# Patient Record
Sex: Male | Born: 1965
Health system: Southern US, Community
[De-identification: ages and names within clinical notes are randomized; demographics above are authoritative.]

## PROBLEM LIST (undated history)

## (undated) DIAGNOSIS — I4819 Other persistent atrial fibrillation: Secondary | ICD-10-CM

## (undated) DIAGNOSIS — R7303 Prediabetes: Secondary | ICD-10-CM

## (undated) DIAGNOSIS — I10 Essential (primary) hypertension: Secondary | ICD-10-CM

## (undated) DIAGNOSIS — Z720 Tobacco use: Secondary | ICD-10-CM

---

## 1999-02-01 ENCOUNTER — Emergency Department (HOSPITAL_COMMUNITY): Admission: EM | Admit: 1999-02-01 | Discharge: 1999-02-01 | Payer: Self-pay | Admitting: Emergency Medicine

## 2000-11-05 ENCOUNTER — Encounter: Payer: Self-pay | Admitting: Emergency Medicine

## 2000-11-05 ENCOUNTER — Emergency Department (HOSPITAL_COMMUNITY): Admission: EM | Admit: 2000-11-05 | Discharge: 2000-11-05 | Payer: Self-pay | Admitting: Emergency Medicine

## 2011-10-21 ENCOUNTER — Encounter (HOSPITAL_COMMUNITY): Payer: Self-pay | Admitting: *Deleted

## 2011-10-21 ENCOUNTER — Emergency Department (HOSPITAL_COMMUNITY)
Admission: EM | Admit: 2011-10-21 | Discharge: 2011-10-21 | Disposition: A | Discharge status: Home / Self Care | Payer: Medicaid Other | Attending: Emergency Medicine | Admitting: Emergency Medicine

## 2011-10-21 ENCOUNTER — Emergency Department (HOSPITAL_COMMUNITY): Payer: Medicaid Other

## 2011-10-21 DIAGNOSIS — J40 Bronchitis, not specified as acute or chronic: Secondary | ICD-10-CM | POA: Insufficient documentation

## 2011-10-21 DIAGNOSIS — F17200 Nicotine dependence, unspecified, uncomplicated: Secondary | ICD-10-CM | POA: Insufficient documentation

## 2011-10-21 DIAGNOSIS — Z720 Tobacco use: Secondary | ICD-10-CM

## 2011-10-21 DIAGNOSIS — I10 Essential (primary) hypertension: Secondary | ICD-10-CM | POA: Insufficient documentation

## 2011-10-21 DIAGNOSIS — R0602 Shortness of breath: Secondary | ICD-10-CM | POA: Insufficient documentation

## 2011-10-21 HISTORY — DX: Essential (primary) hypertension: I10

## 2011-10-21 LAB — BASIC METABOLIC PANEL
CO2: 27 mEq/L (ref 19–32)
Chloride: 102 mEq/L (ref 96–112)
Glucose, Bld: 109 mg/dL — ABNORMAL HIGH (ref 70–99)
Sodium: 141 mEq/L (ref 135–145)

## 2011-10-21 LAB — POCT I-STAT TROPONIN I: Troponin i, poc: 0.02 ng/mL (ref 0.00–0.08)

## 2011-10-21 LAB — CBC
HCT: 43 % (ref 39.0–52.0)
Hemoglobin: 14.5 g/dL (ref 13.0–17.0)
RBC: 4.85 MIL/uL (ref 4.22–5.81)

## 2011-10-21 LAB — PRO B NATRIURETIC PEPTIDE: Pro B Natriuretic peptide (BNP): 20 pg/mL (ref 0–125)

## 2011-10-21 MED ORDER — PREDNISONE 20 MG PO TABS: 60.0000 mg | ORAL_TABLET | Freq: Every day | ORAL | Status: DC

## 2011-10-21 MED ORDER — ALBUTEROL SULFATE HFA 108 (90 BASE) MCG/ACT IN AERS
2.0000 | INHALATION_SPRAY | RESPIRATORY_TRACT | Status: DC | PRN
  Filled 2011-10-21: qty 6.7

## 2011-10-21 MED ORDER — ALBUTEROL SULFATE (5 MG/ML) 0.5% IN NEBU
5.0000 mg | INHALATION_SOLUTION | Freq: Once | RESPIRATORY_TRACT | Status: AC
  Administered 2011-10-21: 5 mg via RESPIRATORY_TRACT
  Filled 2011-10-21: qty 1

## 2011-10-21 MED ORDER — AZITHROMYCIN 250 MG PO TABS: 250.0000 mg | ORAL_TABLET | Freq: Every day | ORAL | Status: AC

## 2011-10-21 NOTE — ED Provider Notes (Signed)
Medical screening examination/treatment/procedure(s) were performed by non-physician practitioner and as supervising physician I was immediately available for consultation/collaboration.   Nat Christen, MD 10/21/11 [phone]

## 2011-10-21 NOTE — Discharge Instructions (Signed)
FOLLOW UP WITH A PRIMARY CARE PHYSICIAN FOR FURTHER MANAGEMENT IF SYMPTOMS DO NOT IMPROVE. RETURN HERE AS NEEDED. TAKE MEDICATIONS AS PRESCRIBED. STOP SMOKING!  Bronchitis Bronchitis is a problem of the air tubes leading to your lungs. This problem makes it hard for air to get in and out of the lungs. You may cough a lot because your air tubes are narrow. Going without care can cause lasting (chronic) bronchitis. HOME CARE   Drink enough fluids to keep your pee (urine) clear or pale yellow.   Use a cool mist humidifier.   Quit smoking if you smoke. If you keep smoking, the bronchitis might not get better.   Only take medicine as told by your doctor.  GET HELP RIGHT AWAY IF:   Coughing keeps you awake.   You start to wheeze.   You become more sick or weak.   You have a hard time breathing or get short of breath.   You cough up blood.   Coughing lasts more than 2 weeks.   You have a fever.   Your baby is older than 3 months with a rectal temperature of 102 F (38.9 C) or higher.   Your baby is 77 months old or younger with a rectal temperature of 100.4 F (38 C) or higher.  MAKE SURE YOU:  Understand these instructions.   Will watch your condition.   Will get help right away if you are not doing well or get worse.  Document Released: 11/27/2007 Document Revised: 05/30/2011 Document Reviewed: 05/12/2009 [facility] Patient Information 2012 [place], [place].

## 2011-10-21 NOTE — ED Notes (Signed)
Patient undressed and in a gown. Cardiac monitor, pulse oximetry, and blood pressure cuff on. 

## 2011-10-21 NOTE — ED Provider Notes (Signed)
History     CSN: [id number]  Arrival date & time 10/21/11  1102   First MD Initiated Contact with Patient 10/21/11 1255      Chief Complaint  Patient presents with  . Chest Pain    after cough    (Consider location/radiation/quality/duration/timing/severity/associated sxs/prior treatment) Patient is a 46 y.o. male presenting with chest pain. The history is provided by the patient.  Chest Pain The chest pain began 3 - 5 days ago. The chest pain is unchanged. The pain is associated with coughing. Primary symptoms include cough. Pertinent negatives for primary symptoms include no fever, no shortness of breath, no nausea and no vomiting. Associated symptoms comments: Patient is pack-per-day smoker with recent onset of cough that is associated with chest pain. Cough and discomfort are worse at night when he lies down. No known fever. He does complain of sweating at night. No N, V. Cough is productive. No shortness of breath..     Past Medical History  Diagnosis Date  . Hypertension     History reviewed. No pertinent past surgical history.  History reviewed. No pertinent family history.  History  Substance Use Topics  . Smoking status: Current Everyday Smoker -- 0.5 packs/day    Types: Cigarettes  . Smokeless tobacco: Not on file  . Alcohol Use: No      Review of Systems  Constitutional: Negative for fever and chills.  HENT: Positive for sore throat. Negative for congestion and rhinorrhea.   Respiratory: Positive for cough. Negative for shortness of breath.   Cardiovascular: Positive for chest pain.       Chest pain with cough.  Gastrointestinal: Negative.  Negative for nausea and vomiting.  Musculoskeletal: Negative.   Skin: Negative.   Neurological: Negative.     Allergies  Review of patient's allergies indicates no known allergies.  Home Medications   Current Outpatient Rx  Name Route Sig Dispense Refill  . ACETAMINOPHEN 500 MG PO TABS Oral Take 1,000 mg by  mouth every 6 (six) hours as needed. For pain    . GUAIFENESIN ER 600 MG PO TB12 Oral Take 600 mg by mouth 2 (two) times daily.      BP 146/81  Pulse 60  Temp(Src) 97.7 F (36.5 C) (Oral)  Resp 20  Ht 5\' 10"  (1.778 m)  Wt 240 lb (108.863 kg)  BMI 34.44 kg/m2  SpO2 97%  Physical Exam  Constitutional: He appears well-developed and well-nourished.  HENT:  Head: Normocephalic.  Neck: Normal range of motion. Neck supple.  Cardiovascular: Normal rate and regular rhythm.   Pulmonary/Chest: Effort normal. He has no wheezes. He has rales.  Abdominal: Soft. Bowel sounds are normal. There is no tenderness. There is no rebound and no guarding.  Musculoskeletal: Normal range of motion. He exhibits no edema.  Skin: Skin is warm and dry.  Psychiatric: He has a normal mood and affect.    ED Course  Procedures (including critical care time)  Labs Reviewed  BASIC METABOLIC PANEL - Abnormal; Notable for the following:    Glucose, Bld 109 (*)    All other components within normal limits  CBC  PRO B NATRIURETIC PEPTIDE  POCT I-STAT TROPONIN I   Results for orders placed during the hospital encounter of 10/21/11  CBC      Component Value Range   WBC 7.1  4.0 - 10.5 (K/uL)   RBC 4.85  4.22 - 5.81 (MIL/uL)   Hemoglobin 14.5  13.0 - 17.0 (g/dL)   HCT 43.0  39.0 - 52.0 (%)   MCV 88.7  78.0 - 100.0 (fL)   MCH 29.9  26.0 - 34.0 (pg)   MCHC 33.7  30.0 - 36.0 (g/dL)   RDW 16.1  09.6 - 04.5 (%)   Platelets 243  150 - 400 (K/uL)  PRO B NATRIURETIC PEPTIDE      Component Value Range   Pro B Natriuretic peptide (BNP) 20.0  0 - 125 (pg/mL)  BASIC METABOLIC PANEL      Component Value Range   Sodium 141  135 - 145 (mEq/L)   Potassium 4.0  3.5 - 5.1 (mEq/L)   Chloride 102  96 - 112 (mEq/L)   CO2 27  19 - 32 (mEq/L)   Glucose, Bld 109 (*) 70 - 99 (mg/dL)   BUN 8  6 - 23 (mg/dL)   Creatinine, Ser 4.09  0.50 - 1.35 (mg/dL)   Calcium 9.5  8.4 - 81.1 (mg/dL)   GFR calc non Af Amer >90  >90  (mL/min)   GFR calc Af Amer >90  >90 (mL/min)  POCT I-STAT TROPONIN I      Component Value Range   Troponin i, poc 0.02  0.00 - 0.08 (ng/mL)   Comment 3              Dg Chest 2 View  10/21/2011  *RADIOLOGY REPORT*  Clinical Data: Cough, shortness of breath and fever.  CHEST - 2 VIEW  Comparison: No priors.  Findings: Lung volumes are normal.  No consolidative airspace disease.  No pleural effusions.  No pneumothorax.  No pulmonary nodule or mass noted.  Pulmonary vasculature and the cardiomediastinal silhouette are within normal limits.  IMPRESSION: 1. No radiographic evidence of acute cardiopulmonary disease.  Original Report Authenticated By: Florencia Reasons, M.D.     No diagnosis found. 1. Bronchitis 2. Tobacco abuse    MDM  CXR unremarkable, no PNA. He is feeling better after breathing tx. EKG nonacute with negative blood studies. Favor bronchitis that would benefit from short course steroids, abx and inhaler. Discussed care plan with patient who is comfortable with discharge.        Rodena Medin, PA-C 10/21/11 1510

## 2011-10-21 NOTE — ED Notes (Signed)
Pt started coughing 1 week prior and pain followed.  Pain is sternal.

## 2011-10-21 NOTE — ED Notes (Signed)
Pt reports having congestion and cough x 1 week.  Sputum is yellow-green in color.  pts chest tender on palpation.  Pt ambulatory.  Denies N/V/diaphoresis.  States that he had a fever at home but did not check to see how high it was.  Took tylenol which broke the fever.  SB clear bilaterally. No wheezing or crackles noted.

## 2012-09-19 ENCOUNTER — Encounter (HOSPITAL_COMMUNITY): Payer: Self-pay | Admitting: Emergency Medicine

## 2012-09-19 ENCOUNTER — Emergency Department (HOSPITAL_COMMUNITY)
Admission: EM | Admit: 2012-09-19 | Discharge: 2012-09-19 | Disposition: A | Discharge status: Home / Self Care | Payer: Self-pay | Attending: Emergency Medicine | Admitting: Emergency Medicine

## 2012-09-19 DIAGNOSIS — I10 Essential (primary) hypertension: Secondary | ICD-10-CM | POA: Insufficient documentation

## 2012-09-19 DIAGNOSIS — F17200 Nicotine dependence, unspecified, uncomplicated: Secondary | ICD-10-CM | POA: Insufficient documentation

## 2012-09-19 DIAGNOSIS — Z79899 Other long term (current) drug therapy: Secondary | ICD-10-CM | POA: Insufficient documentation

## 2012-09-19 DIAGNOSIS — L84 Corns and callosities: Secondary | ICD-10-CM | POA: Insufficient documentation

## 2012-09-19 MED ORDER — TRAMADOL HCL 50 MG PO TABS: 50.0000 mg | ORAL_TABLET | Freq: Four times a day (QID) | ORAL | Status: DC | PRN

## 2012-09-19 MED ORDER — LISINOPRIL-HYDROCHLOROTHIAZIDE 10-12.5 MG PO TABS: 1.0000 | ORAL_TABLET | Freq: Every day | ORAL | Status: DC

## 2012-09-19 MED ORDER — TRAMADOL HCL 50 MG PO TABS
50.0000 mg | ORAL_TABLET | Freq: Once | ORAL | Status: AC
  Administered 2012-09-19: 50 mg via ORAL
  Filled 2012-09-19: qty 1

## 2012-09-19 NOTE — ED Provider Notes (Signed)
History     CSN: [id number]  Arrival date & time 09/19/12  6213   First MD Initiated Contact with Patient 09/19/12 [phone]      Chief Complaint  Patient presents with  . Foot Pain    (Consider location/radiation/quality/duration/timing/severity/associated sxs/prior treatment) HPI  Brian Moody is a 47 y.o. male complaining of bilateral foot pain worsening over the course of 2 weeks. Patient stands at work and wears work boots. Pain was so bad yesterday had to leave work. He has 2 lesions on the sole of his feet that have been worsening. He takes a razor blade and chased him down. Pain is rated as severe, 8/10 and is exacerbated by standing and certain positions. Patient does not have a primary care physician. He was prescribed 2 hypertension pills that he ran out of several months ago.  Past Medical History  Diagnosis Date  . Hypertension     History reviewed. No pertinent past surgical history.  History reviewed. No pertinent family history.  History  Substance Use Topics  . Smoking status: Current Every Day Smoker -- 0.50 packs/day    Types: Cigarettes  . Smokeless tobacco: Not on file  . Alcohol Use: No      Review of Systems  Constitutional: Negative for fever.  Respiratory: Negative for shortness of breath.   Cardiovascular: Negative for chest pain.  Gastrointestinal: Negative for nausea, vomiting, abdominal pain and diarrhea.  Musculoskeletal:       Bilateral  foot pain  All other systems reviewed and are negative.    Allergies  Review of patient's allergies indicates no known allergies.  Home Medications   Current Outpatient Rx  Name  Route  Sig  Dispense  Refill  . acetaminophen (TYLENOL) 500 MG tablet   Oral   Take 1,000 mg by mouth every 6 (six) hours as needed. For pain         . guaiFENesin (MUCINEX) 600 MG 12 hr tablet   Oral   Take 600 mg by mouth 2 (two) times daily.         . predniSONE (DELTASONE) 20 MG tablet   Oral   Take 3  tablets (60 mg total) by mouth daily.   9 tablet   0     BP 180/95  Pulse 74  Temp(Src) 97.7 F (36.5 C) (Oral)  Resp 16  SpO2 98%  Physical Exam  Nursing note and vitals reviewed. Constitutional: He is oriented to person, place, and time. He appears well-developed and well-nourished. No distress.  HENT:  Head: Normocephalic.  Mouth/Throat: Oropharynx is clear and moist.  Eyes: Conjunctivae and EOM are normal. Pupils are equal, round, and reactive to light.  Neck: No JVD present.  Cardiovascular: Normal rate and intact distal pulses.   Pulmonary/Chest: Effort normal and breath sounds normal. No stridor.  Abdominal: Soft. Bowel sounds are normal.  Musculoskeletal: Normal range of motion. He exhibits no edema.  Neurological: He is alert and oriented to person, place, and time.  Skin:  Corn with callus to the soles of both feet  Psychiatric: He has a normal mood and affect.    ED Course  Procedures (including critical care time)  Labs Reviewed - No data to display No results found.   1. Corn or callus   2. Hypertension, uncontrolled   3. Tobacco use disorder       MDM   Brian Moody is a 47 y.o. male bothered by pain from corns and calluses to the soles  of bilateral feet. Patient has uncontrolled hypertension, I counseled patient on the importance of medication compliance with hypertension and primary care followup.   Filed Vitals:   09/19/12 0946  BP: 180/95  Pulse: 74  Temp: 97.7 F (36.5 C)  TempSrc: Oral  Resp: 16  SpO2: 98%     Pt verbalized understanding and agrees with care plan. Outpatient follow-up and return precautions given.    New Prescriptions   LISINOPRIL-HYDROCHLOROTHIAZIDE (PRINZIDE) 10-12.5 MG PER TABLET    Take 1 tablet by mouth daily.   TRAMADOL (ULTRAM) 50 MG TABLET    Take 1 tablet (50 mg total) by mouth every 6 (six) hours as needed for pain.           Wynetta Emery, PA-C 09/19/12 1034

## 2012-09-19 NOTE — ED Notes (Signed)
Onset of feet pain 2 weeks ago, small lesion/callous on heel of right foot and on ball of right foot on right side.

## 2012-09-20 NOTE — ED Provider Notes (Signed)
Medical screening examination/treatment/procedure(s) were performed by non-physician practitioner and as supervising physician I was immediately available for consultation/collaboration.    Vida Roller, MD 09/20/12 [phone]

## 2012-09-24 ENCOUNTER — Encounter (HOSPITAL_COMMUNITY): Payer: Self-pay

## 2012-09-24 ENCOUNTER — Emergency Department (INDEPENDENT_AMBULATORY_CARE_PROVIDER_SITE_OTHER)
Admission: EM | Admit: 2012-09-24 | Discharge: 2012-09-24 | Disposition: A | Discharge status: Home / Self Care | Payer: Self-pay | Source: Home / Self Care

## 2012-09-24 DIAGNOSIS — G8929 Other chronic pain: Secondary | ICD-10-CM

## 2012-09-24 DIAGNOSIS — M79609 Pain in unspecified limb: Secondary | ICD-10-CM

## 2012-09-24 LAB — BASIC METABOLIC PANEL
CO2: 31 mEq/L (ref 19–32)
Chloride: 98 mEq/L (ref 96–112)
GFR calc Af Amer: >90 mL/min (ref >90)
Potassium: 3.2 mEq/L — ABNORMAL LOW (ref 3.5–5.1)
Sodium: 138 mEq/L (ref 135–145)

## 2012-09-24 MED ORDER — TRAMADOL HCL 50 MG PO TABS: 50.0000 mg | ORAL_TABLET | Freq: Four times a day (QID) | ORAL | Status: DC | PRN

## 2012-09-24 MED ORDER — AMLODIPINE BESYLATE 10 MG PO TABS: 10.0000 mg | ORAL_TABLET | Freq: Every day | ORAL | Status: DC

## 2012-09-24 NOTE — ED Notes (Signed)
Patient here to establish care History of HTN 
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
## 2012-10-08 ENCOUNTER — Emergency Department (INDEPENDENT_AMBULATORY_CARE_PROVIDER_SITE_OTHER)
Admission: EM | Admit: 2012-10-08 | Discharge: 2012-10-08 | Disposition: A | Discharge status: Home Health | Payer: Medicaid Other | Source: Home / Self Care

## 2012-10-08 ENCOUNTER — Encounter (HOSPITAL_COMMUNITY): Payer: Self-pay

## 2012-10-08 DIAGNOSIS — I10 Essential (primary) hypertension: Secondary | ICD-10-CM

## 2012-10-08 DIAGNOSIS — M79609 Pain in unspecified limb: Secondary | ICD-10-CM

## 2012-10-08 DIAGNOSIS — M79671 Pain in right foot: Secondary | ICD-10-CM

## 2012-10-08 MED ORDER — OXYCODONE HCL 5 MG PO TABS: 5.0000 mg | ORAL_TABLET | Freq: Four times a day (QID) | ORAL | Status: DC | PRN

## 2012-10-08 NOTE — ED Notes (Signed)
Patient complains of pain  To both feet caluss to middle of right foot

## 2012-10-08 NOTE — Progress Notes (Signed)
Patient Demographics  Brian Moody, is a 47 y.o. male  ZOX:[id number]  [id number]  DOB - [birth year]  Chief Complaint  Patient presents with  . Foot Pain        Subjective:   Brian Moody today is here for a follow up visit. He unfortunately continues to have pain in the left plantar surface and mostly on the right plantar surface from a recurrent large corn/callus. Claims he has difficulty putting weight on the right leg. Patient has No headache, No chest pain, No abdominal pain - No Nausea, No new weakness tingling or numbness, No Cough - SOB.   Objective:    Filed Vitals:   10/08/12 1239  BP: 127/73  Pulse: 78  Temp: 97.7 F (36.5 C)  TempSrc: Oral  Resp: 17  SpO2: 98%     ALLERGIES:  No Known Allergies  PAST MEDICAL HISTORY: Past Medical History  Diagnosis Date  . Hypertension     PAST SURGICAL HISTORY: History reviewed. No pertinent past surgical history.  FAMILY HISTORY: No history of CAD  MEDICATIONS AT HOME: Prior to Admission medications   Medication Sig Start Date End Date Taking? Authorizing Provider  albuterol (PROVENTIL HFA;VENTOLIN HFA) 108 (90 BASE) MCG/ACT inhaler Inhale 2 puffs into the lungs every 6 (six) hours as needed for wheezing.    Historical Provider, MD  amLODipine (NORVASC) 10 MG tablet Take 1 tablet (10 mg total) by mouth daily. 09/24/12   Leroy Sea, MD  lisinopril-hydrochlorothiazide (PRINZIDE) 10-12.5 MG per tablet Take 1 tablet by mouth daily. 09/19/12   Nicole Pisciotta, PA-C  oxyCODONE (ROXICODONE) 5 MG immediate release tablet Take 1 tablet (5 mg total) by mouth every 6 (six) hours as needed for pain. 10/08/12   Elo Marmolejos Levora Dredge, MD    REVIEW OF SYSTEMS:  Constitutional:   No   Fevers, chills, fatigue.  HEENT:    No headaches, Sore throat,   Cardio-vascular: No chest pain,  Orthopnea, swelling in lower extremities, anasarca, palpitations  GI:  No abdominal pain, nausea, vomiting, diarrhea  Resp: No  shortness of breath,  No coughing up of blood.No cough.No wheezing.  Skin:  no rash or lesions.  GU:  no dysuria, change in color of urine, no urgency or frequency.  No flank pain.  Musculoskeletal: No joint pain or swelling.  No decreased range of motion.  No back pain.  Psych: No change in mood or affect. No depression or anxiety.  No memory loss.   Exam  General appearance :Awake, alert, not in any distress. Speech Clear. Not toxic Looking HEENT: Atraumatic and Normocephalic, pupils equally reactive to light and accomodation Neck: supple, no JVD. No cervical lymphadenopathy.  Chest:Good air entry bilaterally, no added sounds  CVS: S1 S2 regular, no murmurs.  Abdomen: Bowel sounds present, Non tender and not distended with no gaurding, rigidity or rebound. Extremities: B/L Lower Ext shows no edema, both legs are warm to touch. Large callus/corn and the plantar aspect of the right foot. No surrounding erythema  Neurology: Awake alert, and oriented X 3, CN II-XII intact, Non focal Skin:No Rash Wounds:N/A    Data Review   CBC No results found for this basename: WBC, HGB, HCT, PLT, MCV, MCH, MCHC, RDW, NEUTRABS, LYMPHSABS, MONOABS, EOSABS, BASOSABS, BANDABS, BANDSABD,  in the last 168 hours  Chemistries   No results found for this basename: NA, K, CL, CO2, GLUCOSE, BUN, CREATININE, GFRCGP, CALCIUM, MG, AST, ALT, ALKPHOS, BILITOT,  in the last 168 hours ------------------------------------------------------------------------------------------------------------------ No results found  for this basename: HGBA1C,  in the last 72 hours ------------------------------------------------------------------------------------------------------------------ No results found for this basename: CHOL, HDL, LDLCALC, TRIG, CHOLHDL, LDLDIRECT,  in the last 72 hours ------------------------------------------------------------------------------------------------------------------ No results found  for this basename: TSH, T4TOTAL, FREET3, T3FREE, THYROIDAB,  in the last 72 hours ------------------------------------------------------------------------------------------------------------------ No results found for this basename: VITAMINB12, FOLATE, FERRITIN, TIBC, IRON, RETICCTPCT,  in the last 72 hours  Coagulation profile  No results found for this basename: INR, PROTIME,  in the last 168 hours    Assessment & Plan  Hypertension - BP controlled, continue with amlodipine and Prinzide   large corn/callus-plantar surface of the right leg - Refer to podiatry     Follow-up Information   Schedule an appointment as soon as possible for a visit in 1 month to follow up.

## 2012-10-09 NOTE — ED Notes (Signed)
Patient has an appt for [facility] 10/14/12 @ 10 am

## 2012-10-22 ENCOUNTER — Emergency Department (INDEPENDENT_AMBULATORY_CARE_PROVIDER_SITE_OTHER)
Admission: EM | Admit: 2012-10-22 | Discharge: 2012-10-22 | Disposition: A | Discharge status: Home Health | Payer: No Typology Code available for payment source | Source: Home / Self Care

## 2012-10-22 ENCOUNTER — Encounter (HOSPITAL_COMMUNITY): Payer: Self-pay | Admitting: *Deleted

## 2012-10-22 DIAGNOSIS — I10 Essential (primary) hypertension: Secondary | ICD-10-CM

## 2012-10-22 DIAGNOSIS — L84 Corns and callosities: Secondary | ICD-10-CM

## 2012-10-22 MED ORDER — OXYCODONE HCL 5 MG PO TABS: 5.0000 mg | ORAL_TABLET | Freq: Four times a day (QID) | ORAL | Status: DC | PRN

## 2012-10-22 MED ORDER — AMLODIPINE BESYLATE 10 MG PO TABS: 10.0000 mg | ORAL_TABLET | Freq: Every day | ORAL | Status: DC

## 2012-10-22 MED ORDER — LISINOPRIL-HYDROCHLOROTHIAZIDE 10-12.5 MG PO TABS: 1.0000 | ORAL_TABLET | Freq: Every day | ORAL | Status: DC

## 2012-10-22 NOTE — Progress Notes (Signed)
Patient Demographics  Brian Moody, is a 47 y.o. male  WGN:[id number]  HQI:[id number]  DOB - 1966/04/19  Follow up visit-still with B/L Foot pain      Subjective:   Brian Moody today is here for a follow up visit. Patient had a large corn on the plantar surface of the right leg and a smallar one on the plantar surface of the left foot. He has seen Podiatry and had it scraped up, however he claims it is recurring and is still complaining of pain-although better than last visit. . Patient has No headache, No chest pain, No abdominal pain - No Nausea, No new weakness tingling or numbness, No Cough - SOB.   Objective:    Filed Vitals:   10/22/12 1510  BP: 126/85  Pulse: 72  Temp: 98.5 F (36.9 C)  TempSrc: Oral  Resp: 20  SpO2: 100%     ALLERGIES:  No Known Allergies  PAST MEDICAL HISTORY: Past Medical History  Diagnosis Date  . Hypertension     MEDICATIONS AT HOME: Prior to Admission medications   Medication Sig Start Date End Date Taking? Authorizing Provider  albuterol (PROVENTIL HFA;VENTOLIN HFA) 108 (90 BASE) MCG/ACT inhaler Inhale 2 puffs into the lungs every 6 (six) hours as needed for wheezing.    Historical Provider, MD  amLODipine (NORVASC) 10 MG tablet Take 1 tablet (10 mg total) by mouth daily. 09/24/12   Leroy Sea, MD  lisinopril-hydrochlorothiazide (PRINZIDE) 10-12.5 MG per tablet Take 1 tablet by mouth daily. 09/19/12   Nicole Pisciotta, PA-C  oxyCODONE (ROXICODONE) 5 MG immediate release tablet Take 1 tablet (5 mg total) by mouth every 6 (six) hours as needed for pain. 10/08/12   Shanker Levora Dredge, MD     Exam  General appearance :Awake, alert, not in any distress. Speech Clear. Not toxic Looking HEENT: Atraumatic and Normocephalic, pupils equally reactive to light and accomodation Neck: supple, no JVD. No cervical lymphadenopathy.  Chest:Good air entry bilaterally, no added sounds  CVS: S1 S2 regular, no murmurs.  Abdomen: Bowel sounds  present, Non tender and not distended with no gaurding, rigidity or rebound. Extremities: B/L Lower Ext shows no edema, both legs are warm to touch-corn (much smaller than last visit) and the plantar aspect of the right foot.  Neurology: Awake alert, and oriented X 3, CN II-XII intact, Non focal Skin:No Rash Wounds:N/A    Data Review   CBC No results found for this basename: WBC, HGB, HCT, PLT, MCV, MCH, MCHC, RDW, NEUTRABS, LYMPHSABS, MONOABS, EOSABS, BASOSABS, BANDABS, BANDSABD,  in the last 168 hours  Chemistries   No results found for this basename: NA, K, CL, CO2, GLUCOSE, BUN, CREATININE, GFRCGP, CALCIUM, MG, AST, ALT, ALKPHOS, BILITOT,  in the last 168 hours ------------------------------------------------------------------------------------------------------------------ No results found for this basename: HGBA1C,  in the last 72 hours ------------------------------------------------------------------------------------------------------------------ No results found for this basename: CHOL, HDL, LDLCALC, TRIG, CHOLHDL, LDLDIRECT,  in the last 72 hours ------------------------------------------------------------------------------------------------------------------ No results found for this basename: TSH, T4TOTAL, FREET3, T3FREE, THYROIDAB,  in the last 72 hours ------------------------------------------------------------------------------------------------------------------ No results found for this basename: VITAMINB12, FOLATE, FERRITIN, TIBC, IRON, RETICCTPCT,  in the last 72 hours  Coagulation profile  No results found for this basename: INR, PROTIME,  in the last 168 hours    Assessment & Plan   HTN -BP controlled, continue with amlodipine and Prinzide  corn/callus-plantar surface of the right leg -have asked patient to make a follow up appt with podiatry -have also explained to him,  that we will take care of his HTN and other medical issues, but this issue will need  to be managed by podiatry. Will refill Oxycodone 5 mg one last time.   Follow-up Information   Follow up with [facility]. Schedule an appointment as soon as possible for a visit in 2 months.

## 2012-10-22 NOTE — ED Notes (Signed)
Present with right foot pain.

## 2012-11-30 ENCOUNTER — Ambulatory Visit: Payer: Self-pay | Attending: Family Medicine | Admitting: Family Medicine

## 2012-11-30 VITALS — BP 127/82 | HR 66 | Temp 98.8°F | Resp 16 | Ht 71.0 in | Wt 226.0 lb

## 2012-11-30 DIAGNOSIS — M79609 Pain in unspecified limb: Secondary | ICD-10-CM

## 2012-11-30 DIAGNOSIS — M79671 Pain in right foot: Secondary | ICD-10-CM

## 2012-11-30 NOTE — Progress Notes (Signed)
Patient states he is here for follow up of foot pain; pain in heels and pain in forefoot due to calluses. Pt states he saw Dr. Charlsie Merles for heel injection May 22. Pt states still having pain.

## 2012-11-30 NOTE — Patient Instructions (Signed)
Follow up with podiatry

## 2012-11-30 NOTE — Progress Notes (Signed)
Subjective:     Patient ID: Brian Moody, male   DOB: Jun 23, 1966, 47 y.o.   MRN: [id number]  HPI Pt here with continued foot pain for which he saw podiatry 5/22. He was told at that time that he had heel spurs and was given steroids shots in both feet. He does not know when he is supposed to f/u with them or what he is supposed to do for continued/breakthrough pain. He is here today requesting narcotic pain meds for the foot pain.  No new injury, no change in pain. It is constant, 8/10, worse with walking/pressure.    Review of Systems  Per hpi      Objective:   Physical Exam  Nursing note and vitals reviewed. Constitutional: He appears well-developed and well-nourished.  Cardiovascular: Normal rate.   Pulmonary/Chest: Effort normal.  Musculoskeletal:  Feet - no obvious abnormalities, neurovasc intact, no erythema, warmth, or swelling.        Assessment:     Foot pain, bilateral       Plan:     Discussed w pt he needs to f/u with podiatry as they are the specialists he is seeing for his foot problem. We will not be managing his pain here. Further, we do not manage chronic pain of any nature here. If pain continues to be an issue could consider referral to pain clinic but needs to d/w podiatry first.  He should rtc 1 month as per prior visit notes to f/u on HTN, earlier if needed. Call with any concerns or questions.

## 2012-12-04 NOTE — Progress Notes (Signed)
Quick Note:  Please have patient comeback for For repeat BMP ______

## 2012-12-30 ENCOUNTER — Ambulatory Visit (HOSPITAL_COMMUNITY)
Admission: RE | Admit: 2012-12-30 | Discharge: 2012-12-30 | Disposition: A | Discharge status: Home / Self Care | Payer: Self-pay | Source: Ambulatory Visit | Attending: Family Medicine | Admitting: Family Medicine

## 2012-12-30 ENCOUNTER — Ambulatory Visit: Payer: Self-pay | Attending: Family Medicine | Admitting: Family Medicine

## 2012-12-30 ENCOUNTER — Encounter: Payer: Self-pay | Admitting: Family Medicine

## 2012-12-30 VITALS — BP 146/83 | HR 65 | Temp 99.4°F | Resp 16 | Ht 71.0 in | Wt 233.0 lb

## 2012-12-30 DIAGNOSIS — M25519 Pain in unspecified shoulder: Secondary | ICD-10-CM | POA: Insufficient documentation

## 2012-12-30 DIAGNOSIS — F17200 Nicotine dependence, unspecified, uncomplicated: Secondary | ICD-10-CM | POA: Insufficient documentation

## 2012-12-30 DIAGNOSIS — I10 Essential (primary) hypertension: Secondary | ICD-10-CM | POA: Insufficient documentation

## 2012-12-30 DIAGNOSIS — R509 Fever, unspecified: Secondary | ICD-10-CM | POA: Insufficient documentation

## 2012-12-30 DIAGNOSIS — Z79899 Other long term (current) drug therapy: Secondary | ICD-10-CM | POA: Insufficient documentation

## 2012-12-30 DIAGNOSIS — E785 Hyperlipidemia, unspecified: Secondary | ICD-10-CM

## 2012-12-30 DIAGNOSIS — R51 Headache: Secondary | ICD-10-CM

## 2012-12-30 DIAGNOSIS — G8929 Other chronic pain: Secondary | ICD-10-CM | POA: Insufficient documentation

## 2012-12-30 DIAGNOSIS — Z09 Encounter for follow-up examination after completed treatment for conditions other than malignant neoplasm: Secondary | ICD-10-CM | POA: Insufficient documentation

## 2012-12-30 DIAGNOSIS — M79609 Pain in unspecified limb: Secondary | ICD-10-CM | POA: Insufficient documentation

## 2012-12-30 LAB — CBC WITH DIFFERENTIAL/PLATELET
Basophils Absolute: 0 10*3/uL (ref 0.0–0.1)
HCT: 41 % (ref 39.0–52.0)
Hemoglobin: 14.1 g/dL (ref 13.0–17.0)
Lymphocytes Relative: 38 % (ref 12–46)
Lymphs Abs: 2.9 10*3/uL (ref 0.7–4.0)
Monocytes Absolute: 0.6 10*3/uL (ref 0.1–1.0)
Monocytes Relative: 7 % (ref 3–12)
Neutro Abs: 4.1 10*3/uL (ref 1.7–7.7)
RBC: 4.88 MIL/uL (ref 4.22–5.81)
RDW: 15.4 % (ref 11.5–15.5)
WBC: 7.7 10*3/uL (ref 4.0–10.5)

## 2012-12-30 LAB — LIPID PANEL
Cholesterol: 226 mg/dL — ABNORMAL HIGH (ref 0–200)
LDL Cholesterol: 156 mg/dL — ABNORMAL HIGH (ref 0–99)
VLDL: 24 mg/dL (ref 0–40)

## 2012-12-30 MED ORDER — NICOTINE 21 MG/24HR TD PT24: 1.0000 | MEDICATED_PATCH | TRANSDERMAL | Status: DC

## 2012-12-30 MED ORDER — NAPROXEN 500 MG PO TABS: ORAL_TABLET | ORAL | Status: DC

## 2012-12-30 MED ORDER — LISINOPRIL-HYDROCHLOROTHIAZIDE 20-12.5 MG PO TABS: 1.0000 | ORAL_TABLET | Freq: Every day | ORAL | Status: DC

## 2012-12-30 MED ORDER — AMLODIPINE BESYLATE 10 MG PO TABS: 10.0000 mg | ORAL_TABLET | Freq: Every day | ORAL | Status: DC

## 2012-12-30 NOTE — Patient Instructions (Addendum)
DASH Diet The DASH diet stands for "Dietary Approaches to Stop Hypertension." It is a healthy eating plan that has been shown to reduce high blood pressure (hypertension) in as little as 14 days, while also possibly providing other significant health benefits. These other health benefits include reducing the risk of breast cancer after menopause and reducing the risk of type 2 diabetes, heart disease, colon cancer, and stroke. Health benefits also include weight loss and slowing kidney failure in patients with chronic kidney disease.  DIET GUIDELINES  Limit salt (sodium). Your diet should contain less than 1500 mg of sodium daily.  Limit refined or processed carbohydrates. Your diet should include mostly whole grains. Desserts and added sugars should be used sparingly.  Include small amounts of heart-healthy fats. These types of fats include nuts, oils, and tub margarine. Limit saturated and trans fats. These fats have been shown to be harmful in the body. CHOOSING FOODS  The following food groups are based on a 2000 calorie diet. See your Registered Dietitian for individual calorie needs. Grains and Grain Products (6 to 8 servings daily)  Eat More Often: Whole-wheat bread, brown rice, whole-grain or wheat pasta, quinoa, popcorn without added fat or salt (air popped).  Eat Less Often: White bread, white pasta, white rice, cornbread. Vegetables (4 to 5 servings daily)  Eat More Often: Fresh, frozen, and canned vegetables. Vegetables may be raw, steamed, roasted, or grilled with a minimal amount of fat.  Eat Less Often/Avoid: Creamed or fried vegetables. Vegetables in a cheese sauce. Fruit (4 to 5 servings daily)  Eat More Often: All fresh, canned (in natural juice), or frozen fruits. Dried fruits without added sugar. One hundred percent fruit juice ( cup [237 mL] daily).  Eat Less Often: Dried fruits with added sugar. Canned fruit in light or heavy syrup. Lean Meats, Fish, and Poultry (2  servings or less daily. One serving is 3 to 4 oz [85-114 g]).  Eat More Often: Ninety percent or leaner ground beef, tenderloin, sirloin. Round cuts of beef, chicken breast, turkey breast. All fish. Grill, bake, or broil your meat. Nothing should be fried.  Eat Less Often/Avoid: Fatty cuts of meat, turkey, or chicken leg, thigh, or wing. Fried cuts of meat or fish. Dairy (2 to 3 servings)  Eat More Often: Low-fat or fat-free milk, low-fat plain or light yogurt, reduced-fat or part-skim cheese.  Eat Less Often/Avoid: Milk (whole, 2%).Whole milk yogurt. Full-fat cheeses. Nuts, Seeds, and Legumes (4 to 5 servings per week)  Eat More Often: All without added salt.  Eat Less Often/Avoid: Salted nuts and seeds, canned beans with added salt. Fats and Sweets (limited)  Eat More Often: Vegetable oils, tub margarines without trans fats, sugar-free gelatin. Mayonnaise and salad dressings.  Eat Less Often/Avoid: Coconut oils, palm oils, butter, stick margarine, cream, half and half, cookies, candy, pie. FOR MORE INFORMATION The Dash Diet Eating Plan: www.dashdiet.org Document Released: 05/30/2011 Document Revised: 09/02/2011 Document Reviewed: 05/30/2011 [employer] Patient Information 2014 [employer]. Hypertension As your heart beats, it forces blood through your arteries. This force is your blood pressure. If the pressure is too high, it is called hypertension (HTN) or high blood pressure. HTN is dangerous because you may have it and not know it. High blood pressure may mean that your heart has to work harder to pump blood. Your arteries may be narrow or stiff. The extra work puts you at risk for heart disease, stroke, and other problems.  Blood pressure consists of two numbers, a   higher number over a lower, 110/72, for example. It is stated as "110 over 72." The ideal is below 120 for the top number (systolic) and under 80 for the bottom (diastolic). Write down your blood pressure today. You  should pay close attention to your blood pressure if you have certain conditions such as:  Heart failure.  Prior heart attack.  Diabetes  Chronic kidney disease.  Prior stroke.  Multiple risk factors for heart disease. To see if you have HTN, your blood pressure should be measured while you are seated with your arm held at the level of the heart. It should be measured at least twice. A one-time elevated blood pressure reading (especially in the Emergency Department) does not mean that you need treatment. There may be conditions in which the blood pressure is different between your right and left arms. It is important to see your caregiver soon for a recheck. Most people have essential hypertension which means that there is not a specific cause. This type of high blood pressure may be lowered by changing lifestyle factors such as:  Stress.  Smoking.  Lack of exercise.  Excessive weight.  Drug/tobacco/alcohol use.  Eating less salt. Most people do not have symptoms from high blood pressure until it has caused damage to the body. Effective treatment can often prevent, delay or reduce that damage. TREATMENT  When a cause has been identified, treatment for high blood pressure is directed at the cause. There are a large number of medications to treat HTN. These fall into several categories, and your caregiver will help you select the medicines that are best for you. Medications may have side effects. You should review side effects with your caregiver. If your blood pressure stays high after you have made lifestyle changes or started on medicines,   Your medication(s) may need to be changed.  Other problems may need to be addressed.  Be certain you understand your prescriptions, and know how and when to take your medicine.  Be sure to follow up with your caregiver within the time frame advised (usually within two weeks) to have your blood pressure rechecked and to review your  medications.  If you are taking more than one medicine to lower your blood pressure, make sure you know how and at what times they should be taken. Taking two medicines at the same time can result in blood pressure that is too low. SEEK IMMEDIATE MEDICAL CARE IF:  You develop a severe headache, blurred or changing vision, or confusion.  You have unusual weakness or numbness, or a faint feeling.  You have severe chest or abdominal pain, vomiting, or breathing problems. MAKE SURE YOU:   Understand these instructions.  Will watch your condition.  Will get help right away if you are not doing well or get worse. Document Released: 06/10/2005 Document Revised: 09/02/2011 Document Reviewed: 01/29/2008 [facility] Patient Information 2014 [place], [place].  Smoking Cessation, Tips for Success YOU CAN QUIT SMOKING If you are ready to quit smoking, congratulations! You have chosen to help yourself be healthier. Cigarettes bring nicotine, tar, carbon monoxide, and other irritants into your body. Your lungs, heart, and blood vessels will be able to work better without these poisons. There are many different ways to quit smoking. Nicotine gum, nicotine patches, a nicotine inhaler, or nicotine nasal spray can help with physical craving. Hypnosis, support groups, and medicines help break the habit of smoking. Here are some tips to help you quit for good.  Throw away all cigarettes.  Clean and remove all ashtrays from your home, work, and car.  On a card, write down your reasons for quitting. Carry the card with you and read it when you get the urge to smoke.  Cleanse your body of nicotine. Drink enough water and fluids to keep your urine clear or pale yellow. Do this after quitting to flush the nicotine from your body.  Learn to predict your moods. Do not let a bad situation be your excuse to have a cigarette. Some situations in your life might tempt you into wanting a cigarette.  Never have "just  one" cigarette. It leads to wanting another and another. Remind yourself of your decision to quit.  Change habits associated with smoking. If you smoked while driving or when feeling stressed, try other activities to replace smoking. Stand up when drinking your coffee. Brush your teeth after eating. Sit in a different chair when you read the paper. Avoid alcohol while trying to quit, and try to drink fewer caffeinated beverages. Alcohol and caffeine may urge you to smoke.  Avoid foods and drinks that can trigger a desire to smoke, such as sugary or spicy foods and alcohol.  Ask people who smoke not to smoke around you.  Have something planned to do right after eating or having a cup of coffee. Take a walk or exercise to perk you up. This will help to keep you from overeating.  Try a relaxation exercise to calm you down and decrease your stress. Remember, you may be tense and nervous for the first 2 weeks after you quit, but this will pass.  Find new activities to keep your hands busy. Play with a pen, coin, or rubber band. Doodle or draw things on paper.  Brush your teeth right after eating. This will help cut down on the craving for the taste of tobacco after meals. You can try mouthwash, too.  Use oral substitutes, such as lemon drops, carrots, a cinnamon stick, or chewing gum, in place of cigarettes. Keep them handy so they are available when you have the urge to smoke.  When you have the urge to smoke, try deep breathing.  Designate your home as a nonsmoking area.  If you are a heavy smoker, ask your caregiver about a prescription for nicotine chewing gum. It can ease your withdrawal from nicotine.  Reward yourself. Set aside the cigarette money you save and buy yourself something nice.  Look for support from others. Join a support group or smoking cessation program. Ask someone at home or at work to help you with your plan to quit smoking.  Always ask yourself, "Do I need this  cigarette or is this just a reflex?" Tell yourself, "Today, I choose not to smoke," or "I do not want to smoke." You are reminding yourself of your decision to quit, even if you do smoke a cigarette. HOW WILL I FEEL WHEN I QUIT SMOKING?  The benefits of not smoking start within days of quitting.  You may have symptoms of withdrawal because your body is used to nicotine (the addictive substance in cigarettes). You may crave cigarettes, be irritable, feel very hungry, cough often, get headaches, or have difficulty concentrating.  The withdrawal symptoms are only temporary. They are strongest when you first quit but will go away within 10 to 14 days.  When withdrawal symptoms occur, stay in control. Think about your reasons for quitting. Remind yourself that these are signs that your body is healing and getting used to  being without cigarettes.  Remember that withdrawal symptoms are easier to treat than the major diseases that smoking can cause.  Even after the withdrawal is over, expect periodic urges to smoke. However, these cravings are generally short-lived and will go away whether you smoke or not. Do not smoke!  If you relapse and smoke again, do not lose hope. Most smokers quit 3 times before they are successful.  If you relapse, do not give up! Plan ahead and think about what you will do the next time you get the urge to smoke. LIFE AS A NONSMOKER: MAKE IT FOR A MONTH, MAKE IT FOR LIFE Day 1: Hang this page where you will see it every day. Day 2: Get rid of all ashtrays, matches, and lighters. Day 3: Drink water. Breathe deeply between sips. Day 4: Avoid places with smoke-filled air, such as bars, clubs, or the smoking section of restaurants. Day 5: Keep track of how much money you save by not smoking. Day 6: Avoid boredom. Keep a good book with you or go to the movies. Day 7: Reward yourself! One week without smoking! Day 8: Make a dental appointment to get your teeth cleaned. Day 9:  Decide how you will turn down a cigarette before it is offered to you. Day 10: Review your reasons for quitting. Day 11: Distract yourself. Stay active to keep your mind off smoking and to relieve tension. Take a walk, exercise, read a book, do a crossword puzzle, or try a new hobby. Day 12: Exercise. Get off the bus before your stop or use stairs instead of escalators. Day 13: Call on friends for support and encouragement. Day 14: Reward yourself! Two weeks without smoking! Day 15: Practice deep breathing exercises. Day 16: Bet a friend that you can stay a nonsmoker. Day 17: Ask to sit in nonsmoking sections of restaurants. Day 18: Hang up "No Smoking" signs. Day 19: Think of yourself as a nonsmoker. Day 20: Each morning, tell yourself you will not smoke. Day 21: Reward yourself! Three weeks without smoking! Day 22: Think of smoking in negative ways. Remember how it stains your teeth, gives you bad breath, and leaves you short of breath. Day 23: Eat a nutritious breakfast. Day 24:Do not relive your days as a smoker. Day 25: Hold a pencil in your hand when talking on the telephone. Day 26: Tell all your friends you do not smoke. Day 27: Think about how much better food tastes. Day 28: Remember, one cigarette is one too many. Day 29: Take up a hobby that will keep your hands busy. Day 30: Congratulations! One month without smoking! Give yourself a big reward. Your caregiver can direct you to community resources or hospitals for support, which may include:  Group support.  Education.  Hypnosis.  Subliminal therapy. Document Released: 03/08/2004 Document Revised: 09/02/2011 Document Reviewed: 03/27/2009 [facility] Patient Information 2014 [place], [place]. Smoking Cessation Quitting smoking is important to your health and has many advantages. However, it is not always easy to quit since nicotine is a very addictive drug. Often times, people try 3 times or more before being able to quit.  This document explains the best ways for you to prepare to quit smoking. Quitting takes hard work and a lot of effort, but you can do it. ADVANTAGES OF QUITTING SMOKING  You will live longer, feel better, and live better.  Your body will feel the impact of quitting smoking almost immediately.  Within 20 minutes, blood pressure decreases. Your pulse  returns to its normal level.  After 8 hours, carbon monoxide levels in the blood return to normal. Your oxygen level increases.  After 24 hours, the chance of having a heart attack starts to decrease. Your breath, hair, and body stop smelling like smoke.  After 48 hours, damaged nerve endings begin to recover. Your sense of taste and smell improve.  After 72 hours, the body is virtually free of nicotine. Your bronchial tubes relax and breathing becomes easier.  After 2 to 12 weeks, lungs can hold more air. Exercise becomes easier and circulation improves.  The risk of having a heart attack, stroke, cancer, or lung disease is greatly reduced.  After 1 year, the risk of coronary heart disease is cut in half.  After 5 years, the risk of stroke falls to the same as a nonsmoker.  After 10 years, the risk of lung cancer is cut in half and the risk of other cancers decreases significantly.  After 15 years, the risk of coronary heart disease drops, usually to the level of a nonsmoker.  If you are pregnant, quitting smoking will improve your chances of having a healthy baby.  The people you live with, especially any children, will be healthier.  You will have extra money to spend on things other than cigarettes. QUESTIONS TO THINK ABOUT BEFORE ATTEMPTING TO QUIT You may want to talk about your answers with your caregiver.  Why do you want to quit?  If you tried to quit in the past, what helped and what did not?  What will be the most difficult situations for you after you quit? How will you plan to handle them?  Who can help you through  the tough times? Your family? Friends? A caregiver?  What pleasures do you get from smoking? What ways can you still get pleasure if you quit? Here are some questions to ask your caregiver:  How can you help me to be successful at quitting?  What medicine do you think would be best for me and how should I take it?  What should I do if I need more help?  What is smoking withdrawal like? How can I get information on withdrawal? GET READY  Set a quit date.  Change your environment by getting rid of all cigarettes, ashtrays, matches, and lighters in your home, car, or work. Do not let people smoke in your home.  Review your past attempts to quit. Think about what worked and what did not. GET SUPPORT AND ENCOURAGEMENT You have a better chance of being successful if you have help. You can get support in many ways.  Tell your family, friends, and co-workers that you are going to quit and need their support. Ask them not to smoke around you.  Get individual, group, or telephone counseling and support. Programs are available at [employer] and health centers. Call your local health department for information about programs in your area.  Spiritual beliefs and practices may help some smokers quit.  Download a "quit meter" on your computer to keep track of quit statistics, such as how long you have gone without smoking, cigarettes not smoked, and money saved.  Get a self-help book about quitting smoking and staying off of tobacco. LEARN NEW SKILLS AND BEHAVIORS  Distract yourself from urges to smoke. Talk to someone, go for a walk, or occupy your time with a task.  Change your normal routine. Take a different route to work. Drink tea instead of coffee. Eat breakfast in  a different place.  Reduce your stress. Take a hot bath, exercise, or read a book.  Plan something enjoyable to do every day. Reward yourself for not smoking.  Explore interactive web-based programs that specialize in  helping you quit. GET MEDICINE AND USE IT CORRECTLY Medicines can help you stop smoking and decrease the urge to smoke. Combining medicine with the above behavioral methods and support can greatly increase your chances of successfully quitting smoking.  Nicotine replacement therapy helps deliver nicotine to your body without the negative effects and risks of smoking. Nicotine replacement therapy includes nicotine gum, lozenges, inhalers, nasal sprays, and skin patches. Some may be available over-the-counter and others require a prescription.  Antidepressant medicine helps people abstain from smoking, but how this works is unknown. This medicine is available by prescription.  Nicotinic receptor partial agonist medicine simulates the effect of nicotine in your brain. This medicine is available by prescription. Ask your caregiver for advice about which medicines to use and how to use them based on your health history. Your caregiver will tell you what side effects to look out for if you choose to be on a medicine or therapy. Carefully read the information on the package. Do not use any other product containing nicotine while using a nicotine replacement product.  RELAPSE OR DIFFICULT SITUATIONS Most relapses occur within the first 3 months after quitting. Do not be discouraged if you start smoking again. Remember, most people try several times before finally quitting. You may have symptoms of withdrawal because your body is used to nicotine. You may crave cigarettes, be irritable, feel very hungry, cough often, get headaches, or have difficulty concentrating. The withdrawal symptoms are only temporary. They are strongest when you first quit, but they will go away within 10 14 days. To reduce the chances of relapse, try to:  Avoid drinking alcohol. Drinking lowers your chances of successfully quitting.  Reduce the amount of caffeine you consume. Once you quit smoking, the amount of caffeine in your body  increases and can give you symptoms, such as a rapid heartbeat, sweating, and anxiety.  Avoid smokers because they can make you want to smoke.  Do not let weight gain distract you. Many smokers will gain weight when they quit, usually less than 10 pounds. Eat a healthy diet and stay active. You can always lose the weight gained after you quit.  Find ways to improve your mood other than smoking. FOR MORE INFORMATION  www.smokefree.gov  Document Released: 06/04/2001 Document Revised: 12/10/2011 Document Reviewed: 09/19/2011 [facility] Patient Information 2014 [place], [place].  Shoulder Pain The shoulder is the joint that connects your arm to your body. Muscles and band-like tissues that connect bones to muscles (tendons) hold the joint together. Shoulder pain is felt if an injury or medical problem affects one or more parts of the shoulder. HOME CARE   Put ice on the sore area.  Put ice in a plastic bag.  Place a towel between your skin and the bag.  Leave the ice on for 15-20 minutes, 3-4 times a day for the first 2 days.  Stop using cold packs if they do not help with the pain.  If you were given something to keep your shoulder from moving (sling, shoulder immobilizer), wear it as told. Only take it off to shower or bathe.  Move your arm as little as possible, but keep your hand moving to prevent puffiness (swelling).  Squeeze a soft ball or foam pad as much as possible to  help prevent swelling.  Take medicine as told by your doctor. GET HELP RIGHT AWAY IF:   Your arm, hand, or fingers are numb or tingling.  Your arm, hand, or fingers are puffy (swollen), painful, or turn white or blue.  You have more pain.  You have progressing new pain in your arm, hand, or fingers.  Your hand or fingers get cold.  Your medicine does not help lessen your pain. MAKE SURE YOU:   Understand these instructions.  Will watch your condition.  Will get help right away if you are not doing  well or get worse. Document Released: 11/27/2007 Document Revised: 03/04/2012 Document Reviewed: 12/23/2011 [facility] Patient Information 2014 [place], [place].

## 2012-12-30 NOTE — Progress Notes (Signed)
Patient ID: Brian Moody, male   DOB: 1966/03/31, 47 y.o.   MRN: [id number]  WU:[id number] up   HPI: Pt says that he is stillSmoking cigarettes.  He reports that he has some right shoulder pain that he woke up with couple of days ago.  He reports that he also is taking his blood pressure medications.  He reports that he smoked right before coming to the clinic today.  The patient reports that he also has chronic heel pain.  He is seeing a podiatrist.  The patient also reports that he has not been lifting or falling or carrying heavy things that could have injured the right shoulder.  No weakness in the shoulder.  No chest pain or shortness of breath.  No Known Allergies Past Medical History  Diagnosis Date  . Hypertension    Current Outpatient Prescriptions on File Prior to Visit  Medication Sig Dispense Refill  . albuterol (PROVENTIL HFA;VENTOLIN HFA) 108 (90 BASE) MCG/ACT inhaler Inhale 2 puffs into the lungs every 6 (six) hours as needed for wheezing.      Marland Kitchen lisinopril-hydrochlorothiazide (PRINZIDE) 10-12.5 MG per tablet Take 1 tablet by mouth daily.  30 tablet  2   No current facility-administered medications on file prior to visit.   History reviewed. No pertinent family history. History   Social History  . Marital Status: Single    Spouse Name: N/A    Number of Children: N/A  . Years of Education: N/A   Occupational History  . Not on file.   Social History Main Topics  . Smoking status: Current Every Day Smoker -- 0.50 packs/day    Types: Cigarettes  . Smokeless tobacco: Not on file  . Alcohol Use: No  . Drug Use: No  . Sexually Active:    Other Topics Concern  . Not on file   Social History Narrative  . No narrative on file    Review of Systems  Constitutional: Negative for fever, chills, diaphoresis, activity change, appetite change and fatigue.  HENT: Negative for ear pain, nosebleeds, congestion, facial swelling, rhinorrhea, neck pain, neck stiffness and ear  discharge.   Eyes: Negative for pain, discharge, redness, itching and visual disturbance.  Respiratory: Negative for cough, choking, chest tightness, shortness of breath, wheezing and stridor.   Cardiovascular: Negative for chest pain, palpitations and leg swelling.  Gastrointestinal: Negative for abdominal distention.  Genitourinary: Negative for dysuria, urgency, frequency, hematuria, flank pain, decreased urine volume, difficulty urinating and dyspareunia.  Musculoskeletal: chronic heel pain.   Neurological: Negative for dizziness, tremors, seizures, syncope, facial asymmetry, speech difficulty, weakness, light-headedness, numbness and headaches.  Hematological: Negative for adenopathy. Does not bruise/bleed easily.  Psychiatric/Behavioral: Negative for hallucinations, behavioral problems, confusion, dysphoric mood, decreased concentration and agitation.   Objective:   Filed Vitals:   12/30/12 0933  BP: 146/83  Pulse: 65  Temp:   Resp:    Physical Exam  Constitutional: Appears well-developed and well-nourished. No distress.  HENT: Normocephalic. External right and left ear normal. Oropharynx is clear and moist.  Eyes: Conjunctivae and EOM are normal. PERRLA, no scleral icterus.  Neck: Normal ROM. Neck supple. No JVD. No tracheal deviation. No thyromegaly.  CVS: RRR, S1/S2 +, no murmurs, no gallops, no carotid bruit.  Pulmonary: Effort and breath sounds normal, no stridor, rhonchi, wheezes, rales.  Abdominal: Soft. BS +,  no distension, tenderness, rebound or guarding.  Musculoskeletal: tenderness in muscles of right chest wall and shoulder.    Lymphadenopathy: No lymphadenopathy noted, cervical, inguinal.  Neuro: Alert. Normal reflexes, muscle tone coordination. No cranial nerve deficit. Skin: Skin is warm and dry. No rash noted. Not diaphoretic. No erythema. No pallor.  Psychiatric: Normal mood and affect. Behavior, judgment, thought content normal.   Lab Results  Component  Value Date   WBC 7.1 10/21/2011   HGB 14.5 10/21/2011   HCT 43.0 10/21/2011   MCV 88.7 10/21/2011   PLT 243 10/21/2011   Lab Results  Component Value Date   CREATININE 1.03 09/24/2012   BUN 14 09/24/2012   NA 138 09/24/2012   K 3.2* 09/24/2012   CL 98 09/24/2012   CO2 31 09/24/2012   No results found for this basename: HGBA1C   Lipid Panel  No results found for this basename: chol, trig, hdl, cholhdl, vldl, ldlcalc     Assessment and plan:   Patient Active Problem List   Diagnosis Date Noted  . Unspecified essential hypertension 12/30/2012  . Smoker 12/30/2012  . Dyslipidemia 12/30/2012  . Fever, unspecified 12/30/2012  . Generalized headaches 12/30/2012   Pt strongly advised to stop smoking and rx for nicotine patches given.  Also, Increase Zestoretic dose to 20/12.5 to take 1 by mouth daily.  Also will check labs today including a metabolic panel, lipid panel CBC and CK level.  Also For the right shoulder pain we'll have him take naproxen 500 mg every 12 hours for inflammation and pain.  I sent him to [facility] for an x-ray of the right shoulder and chest x-ray.  The patient was advised to call his podiatrist and set up a followup appointment to evaluate his chronic heel pain status post having recent injections in the heels.  EKG reviewed:  No acute findings   RTC in 3 months  BP check in 2 weeks recommended  The patient was given clear instructions to go to ER or return to medical center if symptoms don't improve, worsen or new problems develop.  The patient verbalized understanding.  The patient was told to call to get any lab results if not heard anything in the next week.    Rodney Langton, MD, CDE, FAAFP [facility] [place], [place]

## 2012-12-30 NOTE — Progress Notes (Signed)
Patient presents for hypertension follow up and review meds.

## 2012-12-30 NOTE — Progress Notes (Signed)
Quick Note:  Please inform patient that the x-ray of the shoulders revealed he had significant degenerative changes in the a.c. Joint and within the shoulder. He needs to followup with the [facility] medicine center as we have arranged for him an appointment. Also take the anti-inflammatory medications prescribed today. Chest xray came back within normal limits.    Brian Langton, MD, CDE, FAAFP [facility] [place], [place]   ______

## 2012-12-31 LAB — COMPLETE METABOLIC PANEL WITH GFR
AST: 24 U/L (ref 0–37)
Albumin: 4.6 g/dL (ref 3.5–5.2)
BUN: 10 mg/dL (ref 6–23)
CO2: 30 mEq/L (ref 19–32)
Calcium: 9.4 mg/dL (ref 8.4–10.5)
Chloride: 102 mEq/L (ref 96–112)
Potassium: 4.2 mEq/L (ref 3.5–5.3)

## 2013-01-01 ENCOUNTER — Telehealth: Payer: Self-pay

## 2013-01-01 NOTE — Telephone Encounter (Signed)
Message copied by Lestine Mount on Fri Jan 01, 2013 10:55 AM ------      Message from: Cleora Fleet      Created: Fri Jan 01, 2013  9:25 AM       Please inform patient that his labs came back okay except his cholesterol levels were elevated.  Recommend low-fat low-cholesterol diet and exercise 5 times per week.  We should recheck cholesterol levels in 4 months.            Rodney Langton, MD, CDE, FAAFP      [facility]      [facility]      [place], [place]        ------

## 2013-01-01 NOTE — Telephone Encounter (Signed)
Patient is aware of lab results.

## 2013-01-01 NOTE — Progress Notes (Signed)
Quick Note:  Please inform patient that his labs came back okay except his cholesterol levels were elevated. Recommend low-fat low-cholesterol diet and exercise 5 times per week. We should recheck cholesterol levels in 4 months.  Rodney Langton, MD, CDE, FAAFP [facility] [place], [place]   ______

## 2013-01-05 ENCOUNTER — Telehealth: Payer: Self-pay

## 2013-01-05 ENCOUNTER — Ambulatory Visit: Payer: Self-pay

## 2013-01-06 ENCOUNTER — Ambulatory Visit: Payer: Self-pay

## 2013-01-11 ENCOUNTER — Encounter: Payer: Self-pay | Admitting: Internal Medicine

## 2013-01-11 ENCOUNTER — Ambulatory Visit: Payer: Self-pay

## 2013-01-11 ENCOUNTER — Ambulatory Visit: Payer: Self-pay | Attending: Family Medicine | Admitting: Internal Medicine

## 2013-01-11 MED ORDER — LISINOPRIL-HYDROCHLOROTHIAZIDE 20-12.5 MG PO TABS: 1.0000 | ORAL_TABLET | Freq: Every day | ORAL | Status: DC

## 2013-01-11 MED ORDER — AMLODIPINE BESYLATE 10 MG PO TABS: 10.0000 mg | ORAL_TABLET | Freq: Every day | ORAL | Status: DC

## 2013-01-11 MED ORDER — ALBUTEROL SULFATE HFA 108 (90 BASE) MCG/ACT IN AERS: 2.0000 | INHALATION_SPRAY | Freq: Four times a day (QID) | RESPIRATORY_TRACT | Status: DC | PRN

## 2013-01-11 NOTE — Progress Notes (Unsigned)
F/U VISIT HTN C/O BACK/LEG CHRONIC PAIN VSS

## 2013-01-11 NOTE — Patient Instructions (Signed)
Hypertriglyceridemia  Diet for High blood levels of Triglycerides Most fats in food are triglycerides. Triglycerides in your blood are stored as fat in your body. High levels of triglycerides in your blood may put you at a greater risk for heart disease and stroke.  Normal triglyceride levels are less than 150 mg/dL. Borderline high levels are 150-199 mg/dl. High levels are 200 - 499 mg/dL, and very high triglyceride levels are greater than 500 mg/dL. The decision to treat high triglycerides is generally based on the level. For people with borderline or high triglyceride levels, treatment includes weight loss and exercise. Drugs are recommended for people with very high triglyceride levels. Many people who need treatment for high triglyceride levels have metabolic syndrome. This syndrome is a collection of disorders that often include: insulin resistance, high blood pressure, blood clotting problems, high cholesterol and triglycerides. TESTING PROCEDURE FOR TRIGLYCERIDES  You should not eat 4 hours before getting your triglycerides measured. The normal range of triglycerides is between 10 and 250 milligrams per deciliter (mg/dl). Some people may have extreme levels (1000 or above), but your triglyceride level may be too high if it is above 150 mg/dl, depending on what other risk factors you have for heart disease.  People with high blood triglycerides may also have high blood cholesterol levels. If you have high blood cholesterol as well as high blood triglycerides, your risk for heart disease is probably greater than if you only had high triglycerides. High blood cholesterol is one of the main risk factors for heart disease. CHANGING YOUR DIET  Your weight can affect your blood triglyceride level. If you are more than 20% above your ideal body weight, you may be able to lower your blood triglycerides by losing weight. Eating less and exercising regularly is the best way to combat this. Fat provides more  calories than any other food. The best way to lose weight is to eat less fat. Only 30% of your total calories should come from fat. Less than 7% of your diet should come from saturated fat. A diet low in fat and saturated fat is the same as a diet to decrease blood cholesterol. By eating a diet lower in fat, you may lose weight, lower your blood cholesterol, and lower your blood triglyceride level.  Eating a diet low in fat, especially saturated fat, may also help you lower your blood triglyceride level. Ask your dietitian to help you figure how much fat you can eat based on the number of calories your caregiver has prescribed for you.  Exercise, in addition to helping with weight loss may also help lower triglyceride levels.   Alcohol can increase blood triglycerides. You may need to stop drinking alcoholic beverages.  Too much carbohydrate in your diet may also increase your blood triglycerides. Some complex carbohydrates are necessary in your diet. These may include bread, rice, potatoes, other starchy vegetables and cereals.  Reduce "simple" carbohydrates. These may include pure sugars, candy, honey, and jelly without losing other nutrients. If you have the kind of high blood triglycerides that is affected by the amount of carbohydrates in your diet, you will need to eat less sugar and less high-sugar foods. Your caregiver can help you with this.  Adding 2-4 grams of fish oil (EPA+ DHA) may also help lower triglycerides. Speak with your caregiver before adding any supplements to your regimen. Following the Diet  Maintain your ideal weight. Your caregivers can help you with a diet. Generally, eating less food and getting more   exercise will help you lose weight. Joining a weight control group may also help. Ask your caregivers for a good weight control group in your area.  Eat low-fat foods instead of high-fat foods. This can help you lose weight too.  These foods are lower in fat. Eat MORE of these:    Dried beans, peas, and lentils.  Egg whites.  Low-fat cottage cheese.  Fish.  Lean cuts of meat, such as round, sirloin, rump, and flank (cut extra fat off meat you fix).  Whole grain breads, cereals and pasta.  Skim and nonfat dry milk.  Low-fat yogurt.  Poultry without the skin.  Cheese made with skim or part-skim milk, such as mozzarella, parmesan, farmers', ricotta, or pot cheese. These are higher fat foods. Eat LESS of these:   Whole milk and foods made from whole milk, such as American, blue, cheddar, monterey jack, and swiss cheese  High-fat meats, such as luncheon meats, sausages, knockwurst, bratwurst, hot dogs, ribs, corned beef, ground pork, and regular ground beef.  Fried foods. Limit saturated fats in your diet. Substituting unsaturated fat for saturated fat may decrease your blood triglyceride level. You will need to read package labels to know which products contain saturated fats.  These foods are high in saturated fat. Eat LESS of these:   Fried pork skins.  Whole milk.  Skin and fat from poultry.  Palm oil.  Butter.  Shortening.  Cream cheese.  Bacon.  Margarines and baked goods made from listed oils.  Vegetable shortenings.  Chitterlings.  Fat from meats.  Coconut oil.  Palm kernel oil.  Lard.  Cream.  Sour cream.  Fatback.  Coffee whiteners and non-dairy creamers made with these oils.  Cheese made from whole milk. Use unsaturated fats (both polyunsaturated and monounsaturated) moderately. Remember, even though unsaturated fats are better than saturated fats; you still want a diet low in total fat.  These foods are high in unsaturated fat:   Canola oil.  Sunflower oil.  Mayonnaise.  Almonds.  Peanuts.  Pine nuts.  Margarines made with these oils.  Safflower oil.  Olive oil.  Avocados.  Cashews.  Peanut butter.  Sunflower seeds.  Soybean oil.  Peanut  oil.  Olives.  Pecans.  Walnuts.  Pumpkin seeds. Avoid sugar and other high-sugar foods. This will decrease carbohydrates without decreasing other nutrients. Sugar in your food goes rapidly to your blood. When there is excess sugar in your blood, your liver may use it to make more triglycerides. Sugar also contains calories without other important nutrients.  Eat LESS of these:   Sugar, brown sugar, powdered sugar, jam, jelly, preserves, honey, syrup, molasses, pies, candy, cakes, cookies, frosting, pastries, colas, soft drinks, punches, fruit drinks, and regular gelatin.  Avoid alcohol. Alcohol, even more than sugar, may increase blood triglycerides. In addition, alcohol is high in calories and low in nutrients. Ask for sparkling water, or a diet soft drink instead of an alcoholic beverage. Suggestions for planning and preparing meals   Bake, broil, grill or roast meats instead of frying.  Remove fat from meats and skin from poultry before cooking.  Add spices, herbs, lemon juice or vinegar to vegetables instead of salt, rich sauces or gravies.  Use a non-stick skillet without fat or use no-stick sprays.  Cool and refrigerate stews and broth. Then remove the hardened fat floating on the surface before serving.  Refrigerate meat drippings and skim off fat to make low-fat gravies.  Serve more fish.  Use less butter,   margarine and other high-fat spreads on bread or vegetables.  Use skim or reconstituted non-fat dry milk for cooking.  Cook with low-fat cheeses.  Substitute low-fat yogurt or cottage cheese for all or part of the sour cream in recipes for sauces, dips or congealed salads.  Use half yogurt/half mayonnaise in salad recipes.  Substitute evaporated skim milk for cream. Evaporated skim milk or reconstituted non-fat dry milk can be whipped and substituted for whipped cream in certain recipes.  Choose fresh fruits for dessert instead of high-fat foods such as pies or  cakes. Fruits are naturally low in fat. When Dining Out   Order low-fat appetizers such as fruit or vegetable juice, pasta with vegetables or tomato sauce.  Select clear, rather than cream soups.  Ask that dressings and gravies be served on the side. Then use less of them.  Order foods that are baked, broiled, poached, steamed, stir-fried, or roasted.  Ask for margarine instead of butter, and use only a small amount.  Drink sparkling water, unsweetened tea or coffee, or diet soft drinks instead of alcohol or other sweet beverages. QUESTIONS AND ANSWERS ABOUT OTHER FATS IN THE BLOOD: SATURATED FAT, TRANS FAT, AND CHOLESTEROL What is trans fat? Trans fat is a type of fat that is formed when vegetable oil is hardened through a process called hydrogenation. This process helps makes foods more solid, gives them shape, and prolongs their shelf life. Trans fats are also called hydrogenated or partially hydrogenated oils.  What do saturated fat, trans fat, and cholesterol in foods have to do with heart disease? Saturated fat, trans fat, and cholesterol in the diet all raise the level of LDL "bad" cholesterol in the blood. The higher the LDL cholesterol, the greater the risk for coronary heart disease (CHD). Saturated fat and trans fat raise LDL similarly.  What foods contain saturated fat, trans fat, and cholesterol? High amounts of saturated fat are found in animal products, such as fatty cuts of meat, chicken skin, and full-fat dairy products like butter, whole milk, cream, and cheese, and in tropical vegetable oils such as palm, palm kernel, and coconut oil. Trans fat is found in some of the same foods as saturated fat, such as vegetable shortening, some margarines (especially hard or stick margarine), crackers, cookies, baked goods, fried foods, salad dressings, and other processed foods made with partially hydrogenated vegetable oils. Small amounts of trans fat also occur naturally in some animal  products, such as milk products, beef, and lamb. Foods high in cholesterol include liver, other organ meats, egg yolks, shrimp, and full-fat dairy products. How can I use the new food label to make heart-healthy food choices? Check the Nutrition Facts panel of the food label. Choose foods lower in saturated fat, trans fat, and cholesterol. For saturated fat and cholesterol, you can also use the Percent Daily Value (%DV): 5% DV or less is low, and 20% DV or more is high. (There is no %DV for trans fat.) Use the Nutrition Facts panel to choose foods low in saturated fat and cholesterol, and if the trans fat is not listed, read the ingredients and limit products that list shortening or hydrogenated or partially hydrogenated vegetable oil, which tend to be high in trans fat. POINTS TO REMEMBER:   Discuss your risk for heart disease with your caregivers, and take steps to reduce risk factors.  Change your diet. Choose foods that are low in saturated fat, trans fat, and cholesterol.  Add exercise to your daily routine if   it is not already being done. Participate in physical activity of moderate intensity, like brisk walking, for at least 30 minutes on most, and preferably all days of the week. No time? Break the 30 minutes into three, 10-minute segments during the day.  Stop smoking. If you do smoke, contact your caregiver to discuss ways in which they can help you quit.  Do not use street drugs.  Maintain a normal weight.  Maintain a healthy blood pressure.  Keep up with your blood work for checking the fats in your blood as directed by your caregiver. Document Released: 03/28/2004 Document Revised: 12/10/2011 Document Reviewed: 10/24/2008 [employer] Patient Information 2014 [employer].  

## 2013-01-11 NOTE — Progress Notes (Unsigned)
Patient ID: Brian Moody, male   DOB: January 27, 1966, 47 y.o.   MRN: [id number]   HPI: 47 year old male who comes in for a two-week blood pressure followup. He also states that he has paperwork that he would like me to fill out for disability. He states that it is because he has severe calluses on his feet which he has been treated for at [facility]. He previously applied for disability a few months ago but this was denied. He states that more recent x-rays show that he has bone spurs and now he feels that he may qualify for disability. I have asked him to take to paperwork back to the [facility] to have them assist him as they have been managing this issue all along.   No Known Allergies Past Medical History  Diagnosis Date  . Hypertension    Current Outpatient Prescriptions on File Prior to Visit  Medication Sig Dispense Refill  . lisinopril-hydrochlorothiazide (PRINZIDE) 10-12.5 MG per tablet Take 1 tablet by mouth daily.  30 tablet  2  . naproxen (NAPROSYN) 500 MG tablet Take 1 tab po every 12 hours with food prn shoulder pain  30 tablet  0  . nicotine (NICODERM CQ - DOSED IN MG/24 HOURS) 21 mg/24hr patch Place 1 patch onto the skin daily.  28 patch  0   No current facility-administered medications on file prior to visit.   History reviewed. No pertinent family history. History   Social History  . Marital Status: Single    Spouse Name: N/A    Number of Children: N/A  . Years of Education: N/A   Occupational History  . Not on file.   Social History Main Topics  . Smoking status: Current Every Day Smoker -- 0.50 packs/day    Types: Cigarettes  . Smokeless tobacco: Not on file  . Alcohol Use: No  . Drug Use: No  . Sexually Active:    Other Topics Concern  . Not on file   Social History Narrative  . No narrative on file     Objective:   Filed Vitals:   01/11/13 1513  BP: 123/79  Pulse: 74  Temp: 98.3 F (36.8 C)  Resp: 16    Physical Exam  ______ Constitutional: Appears well-developed and well-nourished. No distress. ____ HENT: Normocephalic. External right and left ear normal. Oropharynx is clear and moist. ____ Eyes: Conjunctivae and EOM are normal. PERRLA, no scleral icterus. ____ Neck: Normal ROM. Neck supple. No JVD. No tracheal deviation. No thyromegaly. ____ CVS: RRR, S1/S2 +, no murmurs, no gallops, no carotid bruit.  Pulmonary: Effort and breath sounds normal, no stridor, rhonchi, wheezes, rales.  Abdominal: Soft. BS +,  no distension, tenderness, rebound or guarding. ________ Musculoskeletal: Normal range of motion. No edema and no tenderness. ____ Lymphadenopathy: No lymphadenopathy noted, cervical, inguinal. Neuro: Alert. Normal reflexes, muscle tone coordination. No cranial nerve deficit. Skin: Skin is warm and dry. No rash noted. Not diaphoretic. No erythema. No pallor. ____ Psychiatric: Normal mood and affect. Behavior, judgment, thought content normal. __  Lab Results  Component Value Date   WBC 7.7 12/30/2012   HGB 14.1 12/30/2012   HCT 41.0 12/30/2012   MCV 84.0 12/30/2012   PLT 270 12/30/2012   Lab Results  Component Value Date   CREATININE 0.87 12/30/2012   BUN 10 12/30/2012   NA 139 12/30/2012   K 4.2 12/30/2012   CL 102 12/30/2012   CO2 30 12/30/2012    Lab  Results  Component Value Date   HGBA1C 5.6 12/30/2012   Lipid Panel     Component Value Date/Time   CHOL 226* 12/30/2012 0933   TRIG 118 12/30/2012 0933   HDL 46 12/30/2012 0933   CHOLHDL 4.9 12/30/2012 0933   VLDL 24 12/30/2012 0933   LDLCALC 156* 12/30/2012 0933       Assessment and plan:   Patient Active Problem List   Diagnosis Date Noted  . Unspecified essential hypertension 12/30/2012  . Smoker 12/30/2012  . Dyslipidemia 12/30/2012  . Fever, unspecified 12/30/2012  . Generalized headaches 12/30/2012    #1. Hypertension: BP improved he is to continue current medications and followup in 3 months.

## 2013-01-12 ENCOUNTER — Telehealth: Payer: Self-pay | Admitting: *Deleted

## 2013-01-12 NOTE — Telephone Encounter (Signed)
01/12/13 Patient not available message left via telephone that X-ray results of shoulder and to [facility] as [facility] arrange. Chest  X-ray came back within normal limits. P.Bria Portales,RN BSN [facility]

## 2013-01-20 ENCOUNTER — Ambulatory Visit: Payer: Self-pay

## 2013-04-13 ENCOUNTER — Ambulatory Visit: Payer: Self-pay

## 2013-04-30 ENCOUNTER — Emergency Department (HOSPITAL_COMMUNITY)
Admission: EM | Admit: 2013-04-30 | Discharge: 2013-04-30 | Disposition: A | Discharge status: Home / Self Care | Payer: No Typology Code available for payment source | Attending: Emergency Medicine | Admitting: Emergency Medicine

## 2013-04-30 ENCOUNTER — Emergency Department (HOSPITAL_COMMUNITY): Payer: No Typology Code available for payment source

## 2013-04-30 ENCOUNTER — Encounter (HOSPITAL_COMMUNITY): Payer: Self-pay | Admitting: Emergency Medicine

## 2013-04-30 DIAGNOSIS — Y9301 Activity, walking, marching and hiking: Secondary | ICD-10-CM | POA: Insufficient documentation

## 2013-04-30 DIAGNOSIS — I10 Essential (primary) hypertension: Secondary | ICD-10-CM | POA: Insufficient documentation

## 2013-04-30 DIAGNOSIS — F17200 Nicotine dependence, unspecified, uncomplicated: Secondary | ICD-10-CM | POA: Insufficient documentation

## 2013-04-30 DIAGNOSIS — Y9289 Other specified places as the place of occurrence of the external cause: Secondary | ICD-10-CM | POA: Insufficient documentation

## 2013-04-30 DIAGNOSIS — S8263XA Displaced fracture of lateral malleolus of unspecified fibula, initial encounter for closed fracture: Secondary | ICD-10-CM | POA: Insufficient documentation

## 2013-04-30 DIAGNOSIS — S8261XA Displaced fracture of lateral malleolus of right fibula, initial encounter for closed fracture: Secondary | ICD-10-CM

## 2013-04-30 DIAGNOSIS — R296 Repeated falls: Secondary | ICD-10-CM | POA: Insufficient documentation

## 2013-04-30 DIAGNOSIS — Z79899 Other long term (current) drug therapy: Secondary | ICD-10-CM | POA: Insufficient documentation

## 2013-04-30 MED ORDER — HYDROCODONE-ACETAMINOPHEN 5-325 MG PO TABS
2.0000 | ORAL_TABLET | Freq: Once | ORAL | Status: AC
  Administered 2013-04-30: 2 via ORAL
  Filled 2013-04-30: qty 2

## 2013-04-30 MED ORDER — HYDROCODONE-ACETAMINOPHEN 5-325 MG PO TABS: 2.0000 | ORAL_TABLET | ORAL | Status: DC | PRN

## 2013-04-30 NOTE — ED Notes (Signed)
Pt returned from X-ray.  

## 2013-04-30 NOTE — ED Notes (Signed)
The pt is c/o rt ankle pain where he fell when his ankle gave out on him.  Swollen medially and laterally

## 2013-04-30 NOTE — ED Provider Notes (Signed)
CSN: [id number]     Arrival date & time 04/30/13  2052 History  This chart was scribed for non-physician practitioner, Emilia Beck, PA-C,working with Donnetta Hutching, MD, by Karle Plumber, ED Scribe.  This patient was seen in room TR06C/TR06C and the patient's care was started at 9:45 PM.  Chief Complaint  Patient presents with  . Ankle Injury   Patient is a 47 y.o. male presenting with lower extremity injury. The history is provided by the patient. No language interpreter was used.  Ankle Injury This is a new problem. The current episode started 3 to 5 hours ago. The problem occurs constantly. The problem has not changed since onset.Pertinent negatives include no chest pain, no abdominal pain, no headaches and no shortness of breath. Nothing aggravates the symptoms. Nothing relieves the symptoms. He has tried nothing for the symptoms.   HPI Comments:  Brian Moody is a 47 y.o. male who presents to the Emergency Department complaining of severe right ankle pain onset several hours ago. Pt reports associated swelling and tenderness to palpation. He states he was walking down stairs when his ankle rolled and gave out from underneath him. He denies any numbness in his right foot.  Past Medical History  Diagnosis Date  . Hypertension    History reviewed. No pertinent past surgical history. No family history on file. History  Substance Use Topics  . Smoking status: Current Every Day Smoker -- 0.50 packs/day    Types: Cigarettes  . Smokeless tobacco: Not on file  . Alcohol Use: No    Review of Systems  Respiratory: Negative for shortness of breath.   Cardiovascular: Negative for chest pain.  Gastrointestinal: Negative for abdominal pain.  Musculoskeletal: Positive for arthralgias.       Right ankle pain.  Neurological: Negative for headaches.  All other systems reviewed and are negative.    Allergies  Review of patient's allergies indicates no known allergies.  Home  Medications   Current Outpatient Rx  Name  Route  Sig  Dispense  Refill  . albuterol (PROVENTIL HFA;VENTOLIN HFA) 108 (90 BASE) MCG/ACT inhaler   Inhalation   Inhale 1 puff into the lungs every 6 (six) hours as needed for wheezing or shortness of breath.         Marland Kitchen amLODipine (NORVASC) 10 MG tablet   Oral   Take 10 mg by mouth daily.         Marland Kitchen lisinopril-hydrochlorothiazide (PRINZIDE,ZESTORETIC) 20-12.5 MG per tablet   Oral   Take 1 tablet by mouth daily.          Triage Vitals: BP 167/90  Pulse 78  Temp(Src) 98.1 F (36.7 C) (Oral)  Resp 18  SpO2 100% Physical Exam  Nursing note and vitals reviewed. Constitutional: He is oriented to person, place, and time. He appears well-developed and well-nourished. No distress.  HENT:  Head: Normocephalic and atraumatic.  Eyes: Conjunctivae are normal. No scleral icterus.  Neck: Neck supple.  Cardiovascular: Normal rate and intact distal pulses.   Pulmonary/Chest: Effort normal. No stridor. No respiratory distress.  Abdominal: Normal appearance. He exhibits no distension.  Musculoskeletal: He exhibits edema and tenderness.  Lateral right ankle tenderness to palpation. Lateral malleolar edema.  Neurological: He is alert and oriented to person, place, and time.  Skin: Skin is warm and dry. No rash noted.  Psychiatric: He has a normal mood and affect. His behavior is normal.    ED Course  Procedures (including critical care time) DIAGNOSTIC STUDIES: Oxygen Saturation  is 100% on RA, normal by my interpretation.   COORDINATION OF CARE: 9:49 PM- Will provide pt with a splint for his right ankle. Will provide pt with pain medications. Pt verbalizes understanding and agrees to plan.  Medications - No data to display  Labs Review Labs Reviewed - No data to display Imaging Review Dg Ankle Complete Right  04/30/2013   CLINICAL DATA:  Pain post twisting injury  EXAM: RIGHT ANKLE - COMPLETE 3+ VIEW  COMPARISON:  11/12/2012   FINDINGS: Stable calcaneal spurs. Normal mineralization and alignment. Small cortical fragment inferior to the lateral malleolus. Regional soft tissue swelling. Ankle mortise intact. Corticated ossicle inferior to the medial malleolus. Mild dorsal spurring in the mid foot as before.  IMPRESSION: 1. Probable cortical avulsion fracture from the lateral malleolus, with regional soft tissue swelling.   Electronically Signed   By: Oley Balm M.D.   On: 04/30/2013 21:30    EKG Interpretation   None       MDM   1. Avulsion fracture of lateral malleolus of right fibula     10:06 PM Xray shows probable cortical fracture of right lateral malleolus. Patient will have splint and crutches and Orthopedic follow. No neurovascular compromise. Vitals stable and patient afebrile.   I personally performed the services described in this documentation, which was scribed in my presence. The recorded information has been reviewed and is accurate.    Emilia Beck, PA-C 04/30/13 2207

## 2013-04-30 NOTE — Progress Notes (Signed)
Orthopedic Tech Progress Note Patient Details:  Brian Moody 20-Feb-1966 [id number]  Ortho Devices Type of Ortho Device: Ace wrap;Post (short leg) splint;Crutches Ortho Device/Splint Location: RLE Ortho Device/Splint Interventions: Ordered;Application   Jennye Moccasin 04/30/2013, 10:02 PM

## 2013-05-04 ENCOUNTER — Ambulatory Visit: Payer: Self-pay

## 2013-05-04 NOTE — ED Provider Notes (Signed)
Medical screening examination/treatment/procedure(s) were conducted as a shared visit with non-physician practitioner(s) and myself.  I personally evaluated the patient during the encounter.  EKG Interpretation   None      X-ray shows an avulsion fracture of the lateral malleolus.   Neurovascular intact. Immobilization, pain management, refer to orthopedics  Donnetta Hutching, MD 05/04/13 [phone]

## 2013-05-07 ENCOUNTER — Telehealth: Payer: Self-pay | Admitting: Family Medicine

## 2013-05-07 NOTE — Telephone Encounter (Signed)
We Need to see the patient to determine the need for referral

## 2013-05-07 NOTE — Telephone Encounter (Signed)
Patient needs a referral for the orthopedic. ~CC

## 2013-05-07 NOTE — Telephone Encounter (Signed)
Is it ok to order orthopedic referral?

## 2013-05-10 NOTE — Telephone Encounter (Signed)
Pt told to call clinic for appt

## 2013-05-14 ENCOUNTER — Encounter: Payer: Self-pay | Admitting: Internal Medicine

## 2013-05-14 ENCOUNTER — Ambulatory Visit: Payer: No Typology Code available for payment source | Attending: Internal Medicine | Admitting: Internal Medicine

## 2013-05-14 ENCOUNTER — Ambulatory Visit: Payer: No Typology Code available for payment source

## 2013-05-14 VITALS — BP 169/108 | HR 104 | Temp 99.3°F | Resp 16 | Ht 71.0 in | Wt 240.0 lb

## 2013-05-14 DIAGNOSIS — S82891A Other fracture of right lower leg, initial encounter for closed fracture: Secondary | ICD-10-CM

## 2013-05-14 DIAGNOSIS — I10 Essential (primary) hypertension: Secondary | ICD-10-CM | POA: Insufficient documentation

## 2013-05-14 DIAGNOSIS — S8263XA Displaced fracture of lateral malleolus of unspecified fibula, initial encounter for closed fracture: Secondary | ICD-10-CM | POA: Insufficient documentation

## 2013-05-14 DIAGNOSIS — S82899A Other fracture of unspecified lower leg, initial encounter for closed fracture: Secondary | ICD-10-CM

## 2013-05-14 DIAGNOSIS — E785 Hyperlipidemia, unspecified: Secondary | ICD-10-CM | POA: Insufficient documentation

## 2013-05-14 DIAGNOSIS — X58XXXA Exposure to other specified factors, initial encounter: Secondary | ICD-10-CM | POA: Insufficient documentation

## 2013-05-14 LAB — COMPLETE METABOLIC PANEL WITH GFR
AST: 18 U/L (ref 0–37)
Albumin: 3.9 g/dL (ref 3.5–5.2)
Alkaline Phosphatase: 62 U/L (ref 39–117)
BUN: 10 mg/dL (ref 6–23)
Calcium: 9 mg/dL (ref 8.4–10.5)
Chloride: 104 mEq/L (ref 96–112)
Creat: 0.9 mg/dL (ref 0.50–1.35)
GFR, Est African American: >89 mL/min
GFR, Est Non African American: >89 mL/min
Glucose, Bld: 114 mg/dL — ABNORMAL HIGH (ref 70–99)
Potassium: 3.5 mEq/L (ref 3.5–5.3)
Total Protein: 7.1 g/dL (ref 6.0–8.3)

## 2013-05-14 LAB — LIPID PANEL
Cholesterol: 171 mg/dL (ref 0–200)
Total CHOL/HDL Ratio: 4.6 Ratio

## 2013-05-14 LAB — POCT GLYCOSYLATED HEMOGLOBIN (HGB A1C): Hemoglobin A1C: 5.5

## 2013-05-14 MED ORDER — AMLODIPINE BESYLATE 10 MG PO TABS: 10.0000 mg | ORAL_TABLET | Freq: Every day | ORAL | Status: DC

## 2013-05-14 MED ORDER — LISINOPRIL-HYDROCHLOROTHIAZIDE 20-12.5 MG PO TABS: 1.0000 | ORAL_TABLET | Freq: Every day | ORAL | Status: DC

## 2013-05-14 MED ORDER — SIMVASTATIN 20 MG PO TABS: 20.0000 mg | ORAL_TABLET | Freq: Every day | ORAL | Status: DC

## 2013-05-14 MED ORDER — ALBUTEROL SULFATE HFA 108 (90 BASE) MCG/ACT IN AERS: 1.0000 | INHALATION_SPRAY | Freq: Four times a day (QID) | RESPIRATORY_TRACT | Status: DC | PRN

## 2013-05-14 MED ORDER — HYDROCODONE-ACETAMINOPHEN 5-325 MG PO TABS: 1.0000 | ORAL_TABLET | ORAL | Status: DC | PRN

## 2013-05-14 NOTE — Progress Notes (Signed)
Patient ID: Brian Moody, male   DOB: [birth year], 47 y.o.   MRN: [id number]   CC:  HPI: 47 year old male who presents after sustaining an avulsion fracture of the lateral malleolus of the right fibula. He was seen in the ER on 11/7. The patient by orthopedic technician and provided with an Ace wrap, splint, crutches. The patient was also given a prescription for Vicodin for the fracture He still waiting on seeing an orthopedic physician  He states that he has been off his antihypertensive medication and his blood pressure is elevated today  Denies any chest pain any shortness of breath   No Known Allergies Past Medical History  Diagnosis Date  . Hypertension    Current Outpatient Prescriptions on File Prior to Visit  Medication Sig Dispense Refill  . HYDROcodone-acetaminophen (NORCO/VICODIN) 5-325 MG per tablet Take 2 tablets by mouth every 4 (four) hours as needed.  16 tablet  0   No current facility-administered medications on file prior to visit.   History reviewed. No pertinent family history. History   Social History  . Marital Status: Single    Spouse Name: N/A    Number of Children: N/A  . Years of Education: N/A   Occupational History  . Not on file.   Social History Main Topics  . Smoking status: Current Every Day Smoker -- 0.50 packs/day    Types: Cigarettes  . Smokeless tobacco: Not on file  . Alcohol Use: No  . Drug Use: No  . Sexual Activity:    Other Topics Concern  . Not on file   Social History Narrative  . No narrative on file    Review of Systems  Constitutional: Negative for fever, chills, diaphoresis, activity change, appetite change and fatigue.  HENT: Negative for ear pain, nosebleeds, congestion, facial swelling, rhinorrhea, neck pain, neck stiffness and ear discharge.   Eyes: Negative for pain, discharge, redness, itching and visual disturbance.  Respiratory: Negative for cough, choking, chest tightness, shortness of breath, wheezing  and stridor.   Cardiovascular: Negative for chest pain, palpitations and leg swelling.  Gastrointestinal: Negative for abdominal distention.  Genitourinary: Negative for dysuria, urgency, frequency, hematuria, flank pain, decreased urine volume, difficulty urinating and dyspareunia.  Musculoskeletal: Negative for back pain, joint swelling, arthralgias and gait problem.  Neurological: Negative for dizziness, tremors, seizures, syncope, facial asymmetry, speech difficulty, weakness, light-headedness, numbness and headaches.  Hematological: Negative for adenopathy. Does not bruise/bleed easily.  Psychiatric/Behavioral: Negative for hallucinations, behavioral problems, confusion, dysphoric mood, decreased concentration and agitation.    Objective:   Filed Vitals:   05/14/13 1404  BP: 169/108  Pulse: 104  Temp: 99.3 F (37.4 C)  Resp: 16    Physical Exam  Constitutional: Appears well-developed and well-nourished. No distress.  HENT: Normocephalic. External right and left ear normal. Oropharynx is clear and moist.  Eyes: Conjunctivae and EOM are normal. PERRLA, no scleral icterus.  Neck: Normal ROM. Neck supple. No JVD. No tracheal deviation. No thyromegaly.  CVS: RRR, S1/S2 +, no murmurs, no gallops, no carotid bruit.  Pulmonary: Effort and breath sounds normal, no stridor, rhonchi, wheezes, rales.  Abdominal: Soft. BS +,  no distension, tenderness, rebound or guarding.  Musculoskeletal: Normal range of motion. No edema and no tenderness.  Lymphadenopathy: No lymphadenopathy noted, cervical, inguinal. Neuro: Alert. Normal reflexes, muscle tone coordination. No cranial nerve deficit. Skin: Skin is warm and dry. No rash noted. Not diaphoretic. No erythema. No pallor.  Psychiatric: Normal mood and affect. Behavior, judgment, thought content  normal.   Lab Results  Component Value Date   WBC 7.7 12/30/2012   HGB 14.1 12/30/2012   HCT 41.0 12/30/2012   MCV 84.0 12/30/2012   PLT 270 12/30/2012    Lab Results  Component Value Date   CREATININE 0.87 12/30/2012   BUN 10 12/30/2012   NA 139 12/30/2012   K 4.2 12/30/2012   CL 102 12/30/2012   CO2 30 12/30/2012    Lab Results  Component Value Date   HGBA1C 5.6 12/30/2012   Lipid Panel     Component Value Date/Time   CHOL 226* 12/30/2012 0933   TRIG 118 12/30/2012 0933   HDL 46 12/30/2012 0933   CHOLHDL 4.9 12/30/2012 0933   VLDL 24 12/30/2012 0933   LDLCALC 156* 12/30/2012 0933       Assessment and plan:   Patient Active Problem List   Diagnosis Date Noted  . Unspecified essential hypertension 12/30/2012  . Smoker 12/30/2012  . Dyslipidemia 12/30/2012  . Fever, unspecified 12/30/2012  . Generalized headaches 12/30/2012   Avulsion fracture, lateral malleolus Orthopedic referral provided Continue Vicodin Surgical boot provided Patient advised to stay  nonweightbearing   Hypertension Medications refills Recheck kidney function today  Dyslipidemia elevated LDL Started the patient on Zocor 20 mg a day  Follow up in 2 months      The patient was given clear instructions to go to ER or return to medical center if symptoms don't improve, worsen or new problems develop. The patient verbalized understanding. The patient was told to call to get any lab results if not heard anything in the next week.

## 2013-05-14 NOTE — Progress Notes (Signed)
Pt is here following up on his broken ankle. Pt has HTN and needs medication for that.

## 2013-05-15 LAB — VITAMIN D 25 HYDROXY (VIT D DEFICIENCY, FRACTURES): Vit D, 25-Hydroxy: 14 ng/mL — ABNORMAL LOW (ref 30–89)

## 2013-05-19 ENCOUNTER — Other Ambulatory Visit: Payer: Self-pay | Admitting: Internal Medicine

## 2013-05-19 MED ORDER — ALBUTEROL SULFATE HFA 108 (90 BASE) MCG/ACT IN AERS: 1.0000 | INHALATION_SPRAY | Freq: Four times a day (QID) | RESPIRATORY_TRACT | Status: DC | PRN

## 2013-06-02 ENCOUNTER — Encounter: Payer: Self-pay | Admitting: Emergency Medicine

## 2013-06-02 ENCOUNTER — Ambulatory Visit (INDEPENDENT_AMBULATORY_CARE_PROVIDER_SITE_OTHER): Payer: No Typology Code available for payment source | Admitting: Emergency Medicine

## 2013-06-02 VITALS — BP 110/75 | Ht 70.0 in | Wt 240.0 lb

## 2013-06-02 DIAGNOSIS — S82899A Other fracture of unspecified lower leg, initial encounter for closed fracture: Secondary | ICD-10-CM

## 2013-06-02 DIAGNOSIS — M25579 Pain in unspecified ankle and joints of unspecified foot: Secondary | ICD-10-CM

## 2013-06-02 DIAGNOSIS — S82839A Other fracture of upper and lower end of unspecified fibula, initial encounter for closed fracture: Secondary | ICD-10-CM

## 2013-06-02 DIAGNOSIS — M25571 Pain in right ankle and joints of right foot: Secondary | ICD-10-CM

## 2013-06-02 HISTORY — DX: Other fracture of upper and lower end of unspecified fibula, initial encounter for closed fracture: S82.839A

## 2013-06-02 NOTE — Progress Notes (Signed)
Patient ID: Brian Moody, male   DOB: [birth year], 47 y.o.   MRN: [id number] 47 year old male presents proximally 5 weeks status post right distal fibula avulsion and ankle sprain. Is seen in the emergency department on 04/30/2013 at which time his diagnosis was made. Specimen posterior splint given crutches and referred to his primary care doctor orthopedics for followup. He continues to have pain at today's visit personally to 3 weeks ago he saw his internist was given a postop shoe to wear over his splint. Remain on crutches since that visit. He continues to have pain and discomfort over the site of the fracture as well as around his ankle from rubbing of the splint. No numbness or tingling distally. Finishes pain medication one to 2 weeks ago.   Pertinent past medical history:  Hypertension  Social history: Cigarette smoker  Review of systems as per history of present illness otherwise negative  Examination: BP 110/75  Ht 5\' 10"  (1.778 m)  Wt 240 lb (108.863 kg)  BMI 34.44 kg/m2 Well-developed well-nourished 47 year old [place] American male awake alert and oriented no acute distress Right ankle: Splint removed in the office today. Tenderness to palpation in the distal fibula and talofibular ligament region. No significant swelling. No evidence of skin breakdown from the splint. Neurovascularly intact. Negative talar tilt and anterior drawer. Plantar and dorsiflexion 4/5 strength.  X-rays 04/30/2013 were reviewed in the office today and show signs of distal fibular avulsion.

## 2013-06-02 NOTE — Assessment & Plan Note (Signed)
At today's visit the patient was still in a posterior splint wearing a postop wooden shoe. The splint was removed. He was placed in an Aircast stirrup splint. He'll continue crutches for the next one week with mild toe touching. X-rays were ordered for next week. I will followup in the x-rays and if there is good bony alignment and healing at that time he'll call him and recommend to increase weightbearing continue to wear the Aircast and start ankle stability exercises and balance exercises

## 2013-06-03 ENCOUNTER — Ambulatory Visit: Payer: No Typology Code available for payment source

## 2013-06-07 ENCOUNTER — Ambulatory Visit
Admission: RE | Admit: 2013-06-07 | Discharge: 2013-06-07 | Disposition: A | Discharge status: Home / Self Care | Payer: No Typology Code available for payment source | Source: Ambulatory Visit | Attending: Emergency Medicine | Admitting: Emergency Medicine

## 2013-06-07 DIAGNOSIS — M25571 Pain in right ankle and joints of right foot: Secondary | ICD-10-CM

## 2013-06-09 ENCOUNTER — Ambulatory Visit (INDEPENDENT_AMBULATORY_CARE_PROVIDER_SITE_OTHER): Payer: No Typology Code available for payment source | Admitting: Emergency Medicine

## 2013-06-09 ENCOUNTER — Encounter: Payer: Self-pay | Admitting: Emergency Medicine

## 2013-06-09 VITALS — BP 113/78 | Ht 70.0 in | Wt 235.0 lb

## 2013-06-09 DIAGNOSIS — S82899A Other fracture of unspecified lower leg, initial encounter for closed fracture: Secondary | ICD-10-CM

## 2013-06-09 DIAGNOSIS — S82839A Other fracture of upper and lower end of unspecified fibula, initial encounter for closed fracture: Secondary | ICD-10-CM

## 2013-06-09 MED ORDER — IBUPROFEN 800 MG PO TABS: 800.0000 mg | ORAL_TABLET | Freq: Three times a day (TID) | ORAL | Status: DC | PRN

## 2013-06-09 NOTE — Progress Notes (Signed)
Patient ID: Brian Moody, male   DOB: Jun 25, 1965, 47 y.o.   MRN: [id number] Patient presents for followup of visit 1 week ago. This is a history of avulsion fracture of the right distal fibula which occurred in early November. He is approximately 6 weeks post injury at this time. Had x-rays done 2 days ago. He follows up today for results of x-ray. Overall doing well he's been wearing the Aircast that we did last week within a postop shoe and ambulating without significant limitation. He is now using a cane instead of crutches. He continues to have pain most notable at the end of the day after prolonged standing.  Review of systems as per history of present illness otherwise negative  Examination: Well-developed well-nourished 47 year old African American male awake alert and oriented no acute distress Right Ankle: No visible erythema or swelling. Range of motion is full in all directions. Strength is 5/5 in all directions. Stable lateral and medial ligaments; squeeze test and kleiger test unremarkable; Talar dome nontender; No pain at base of 5th MT; No tenderness over cuboid; No tenderness over N spot or navicular prominence No tenderness on posterior aspect medial malleolus Tenderness on distal fibula  X-rays: Right ankle films reveal evidence of bony healing with mild bony irregularity noted in the region of the prior avulsion fracture.

## 2013-06-09 NOTE — Assessment & Plan Note (Signed)
Doing well. At this time advised patient to wean from postop shoe to wear regular shoes with Aircast. Continue to use cane as needed. 800 mg of Motrin as needed

## 2013-07-08 ENCOUNTER — Ambulatory Visit (INDEPENDENT_AMBULATORY_CARE_PROVIDER_SITE_OTHER): Payer: No Typology Code available for payment source | Admitting: Sports Medicine

## 2013-07-08 ENCOUNTER — Encounter: Payer: Self-pay | Admitting: Sports Medicine

## 2013-07-08 VITALS — BP 113/71 | HR 82 | Ht 70.0 in | Wt 235.0 lb

## 2013-07-08 DIAGNOSIS — S82899A Other fracture of unspecified lower leg, initial encounter for closed fracture: Secondary | ICD-10-CM

## 2013-07-08 DIAGNOSIS — M25571 Pain in right ankle and joints of right foot: Secondary | ICD-10-CM

## 2013-07-08 DIAGNOSIS — M25579 Pain in unspecified ankle and joints of unspecified foot: Secondary | ICD-10-CM

## 2013-07-08 DIAGNOSIS — S82839A Other fracture of upper and lower end of unspecified fibula, initial encounter for closed fracture: Secondary | ICD-10-CM

## 2013-07-08 MED ORDER — DICLOFENAC SODIUM 75 MG PO TBEC: 75.0000 mg | DELAYED_RELEASE_TABLET | Freq: Two times a day (BID) | ORAL | Status: DC

## 2013-07-08 NOTE — Progress Notes (Signed)
   Subjective:    Patient ID: Brian Moody, male    DOB: 10/20/1965, 48 y.o.   MRN: [id number]  HPI Patient comes in today for followup on a small avulsion fracture off of the right distal fibula. He is wearing his Aircast. Still having pain, swelling, and instability. He was last seen in the office by Dr. Lorri FrederickMcGrath on December 17. He repeated x-rays which shows a tiny avulsion fracture off of the distal fibula as well as the medial malleolus. No significant intra-articular involvement. He has been taking 800 mg of Motrin as needed but that is not helping. He is here today with his wife.    Review of Systems     Objective:   Physical Exam Well-developed, well-nourished. No acute distress  Right ankle: Limited range of motion in all planes. No effusion. No soft tissue swelling. Diffuse tenderness to palpation. Good ligamentous stability. Neurovascularly intact distally. Walking with a limp       Assessment & Plan:  Distal fibula avulsion fracture, right ankle  This is a very stable fracture. Patient's symptoms are due to immobilization and I have highly recommended formal physical therapy. Until he has several sessions of physical therapy and begins to increase his range of motion and strength I will have little else to offer him in the form of treatment. Again, this is a stable injury. I'm going to try him on Voltaren 75 mg twice daily with food when necessary. I've asked that he try to wean from his Aircast when walking on level ground. Followup when necessary.  Of note, he has given me a letter from [facility] as well as a form asking about his level of impairment. That letter is dated 10/12/2012 which is well before our treatment for this acute ankle injury. Therefore, patient will need to take this letter to his previous treating physician. I do not believe this current injury warrants long-term disability.

## 2013-07-14 ENCOUNTER — Ambulatory Visit: Payer: Self-pay | Admitting: Internal Medicine

## 2013-08-19 ENCOUNTER — Ambulatory Visit: Payer: No Typology Code available for payment source | Admitting: Physical Therapy

## 2013-08-24 ENCOUNTER — Ambulatory Visit: Payer: No Typology Code available for payment source | Attending: Internal Medicine | Admitting: Internal Medicine

## 2013-08-24 ENCOUNTER — Encounter: Payer: Self-pay | Admitting: Internal Medicine

## 2013-08-24 VITALS — BP 152/92 | HR 95 | Temp 98.3°F | Ht 70.0 in | Wt 232.4 lb

## 2013-08-24 DIAGNOSIS — IMO0001 Reserved for inherently not codable concepts without codable children: Secondary | ICD-10-CM | POA: Insufficient documentation

## 2013-08-24 DIAGNOSIS — M25571 Pain in right ankle and joints of right foot: Secondary | ICD-10-CM

## 2013-08-24 DIAGNOSIS — M25579 Pain in unspecified ankle and joints of unspecified foot: Secondary | ICD-10-CM | POA: Insufficient documentation

## 2013-08-24 DIAGNOSIS — F17200 Nicotine dependence, unspecified, uncomplicated: Secondary | ICD-10-CM | POA: Insufficient documentation

## 2013-08-24 DIAGNOSIS — E785 Hyperlipidemia, unspecified: Secondary | ICD-10-CM

## 2013-08-24 DIAGNOSIS — E559 Vitamin D deficiency, unspecified: Secondary | ICD-10-CM

## 2013-08-24 DIAGNOSIS — E7849 Other hyperlipidemia: Secondary | ICD-10-CM | POA: Insufficient documentation

## 2013-08-24 DIAGNOSIS — I10 Essential (primary) hypertension: Secondary | ICD-10-CM

## 2013-08-24 MED ORDER — LISINOPRIL-HYDROCHLOROTHIAZIDE 20-12.5 MG PO TABS: 1.0000 | ORAL_TABLET | Freq: Every day | ORAL | Status: DC

## 2013-08-24 MED ORDER — AMLODIPINE BESYLATE 10 MG PO TABS: 10.0000 mg | ORAL_TABLET | Freq: Every day | ORAL | Status: DC

## 2013-08-24 MED ORDER — VITAMIN D (ERGOCALCIFEROL) 1.25 MG (50000 UNIT) PO CAPS: 50000.0000 [IU] | ORAL_CAPSULE | ORAL | Status: DC

## 2013-08-24 NOTE — Patient Instructions (Signed)
DASH Diet  The DASH diet stands for "Dietary Approaches to Stop Hypertension." It is a healthy eating plan that has been shown to reduce high blood pressure (hypertension) in as little as 14 days, while also possibly providing other significant health benefits. These other health benefits include reducing the risk of breast cancer after menopause and reducing the risk of type 2 diabetes, heart disease, colon cancer, and stroke. Health benefits also include weight loss and slowing kidney failure in patients with chronic kidney disease.   DIET GUIDELINES  · Limit salt (sodium). Your diet should contain less than 1500 mg of sodium daily.  · Limit refined or processed carbohydrates. Your diet should include mostly whole grains. Desserts and added sugars should be used sparingly.  · Include small amounts of heart-healthy fats. These types of fats include nuts, oils, and tub margarine. Limit saturated and trans fats. These fats have been shown to be harmful in the body.  CHOOSING FOODS   The following food groups are based on a 2000 calorie diet. See your Registered Dietitian for individual calorie needs.  Grains and Grain Products (6 to 8 servings daily)  · Eat More Often: Whole-wheat bread, brown rice, whole-grain or wheat pasta, quinoa, popcorn without added fat or salt (air popped).  · Eat Less Often: White bread, white pasta, white rice, cornbread.  Vegetables (4 to 5 servings daily)  · Eat More Often: Fresh, frozen, and canned vegetables. Vegetables may be raw, steamed, roasted, or grilled with a minimal amount of fat.  · Eat Less Often/Avoid: Creamed or fried vegetables. Vegetables in a cheese sauce.  Fruit (4 to 5 servings daily)  · Eat More Often: All fresh, canned (in natural juice), or frozen fruits. Dried fruits without added sugar. One hundred percent fruit juice (½ cup [237 mL] daily).  · Eat Less Often: Dried fruits with added sugar. Canned fruit in light or heavy syrup.  Lean Meats, Fish, and Poultry (2  servings or less daily. One serving is 3 to 4 oz [85-114 g]).  · Eat More Often: Ninety percent or leaner ground beef, tenderloin, sirloin. Round cuts of beef, chicken breast, turkey breast. All fish. Grill, bake, or broil your meat. Nothing should be fried.  · Eat Less Often/Avoid: Fatty cuts of meat, turkey, or chicken leg, thigh, or wing. Fried cuts of meat or fish.  Dairy (2 to 3 servings)  · Eat More Often: Low-fat or fat-free milk, low-fat plain or light yogurt, reduced-fat or part-skim cheese.  · Eat Less Often/Avoid: Milk (whole, 2%). Whole milk yogurt. Full-fat cheeses.  Nuts, Seeds, and Legumes (4 to 5 servings per week)  · Eat More Often: All without added salt.  · Eat Less Often/Avoid: Salted nuts and seeds, canned beans with added salt.  Fats and Sweets (limited)  · Eat More Often: Vegetable oils, tub margarines without trans fats, sugar-free gelatin. Mayonnaise and salad dressings.  · Eat Less Often/Avoid: Coconut oils, palm oils, butter, stick margarine, cream, half and half, cookies, candy, pie.  FOR MORE INFORMATION  The Dash Diet Eating Plan: www.dashdiet.org  Document Released: 05/30/2011 Document Revised: 09/02/2011 Document Reviewed: 05/30/2011  [employer]® Patient Information ©2014 [employer].

## 2013-08-24 NOTE — Progress Notes (Signed)
MRN: [id number] Name: Brian Moody  Sex: male Age: 48 y.o. DOB: 04/01/1966  Allergies: Review of patient's allergies indicates no known allergies.  Chief Complaint  Patient presents with  . Ankle Pain    HPI: Patient is 48 y.o. male who has history of hypertension, hyperlipidemia, right distal fibular fracture/ankle pain following up her with orthopedics and is going to have physical therapy done, today patient's blood pressure is elevated denies any headache or dizziness as per patient he did not take his medication today, previous blood work reviewed her also noticed patient has vitamin D deficiency. Patient is requesting refill on blood pressure medications.  Past Medical History  Diagnosis Date  . Hypertension     No past surgical history on file.    Medication List       This list is accurate as of: 08/24/13  2:41 PM.  Always use your most recent med list.               albuterol 108 (90 BASE) MCG/ACT inhaler  Commonly known as:  PROVENTIL HFA;VENTOLIN HFA  Inhale 1 puff into the lungs every 6 (six) hours as needed for wheezing or shortness of breath.     amLODipine 10 MG tablet  Commonly known as:  NORVASC  Take 1 tablet (10 mg total) by mouth daily.     ibuprofen 800 MG tablet  Commonly known as:  ADVIL,MOTRIN  Take 1 tablet (800 mg total) by mouth every 8 (eight) hours as needed.     lisinopril-hydrochlorothiazide 20-12.5 MG per tablet  Commonly known as:  PRINZIDE,ZESTORETIC  Take 1 tablet by mouth daily.     simvastatin 20 MG tablet  Commonly known as:  ZOCOR  Take 1 tablet (20 mg total) by mouth at bedtime.     Vitamin D (Ergocalciferol) 50000 UNITS Caps capsule  Commonly known as:  DRISDOL  Take 1 capsule (50,000 Units total) by mouth every 7 (seven) days.        Meds ordered this encounter  Medications  . Vitamin D, Ergocalciferol, (DRISDOL) 50000 UNITS CAPS capsule    Sig: Take 1 capsule (50,000 Units total) by mouth every 7 (seven)  days.    Dispense:  12 capsule    Refill:  0  . lisinopril-hydrochlorothiazide (PRINZIDE,ZESTORETIC) 20-12.5 MG per tablet    Sig: Take 1 tablet by mouth daily.    Dispense:  90 tablet    Refill:  2  . amLODipine (NORVASC) 10 MG tablet    Sig: Take 1 tablet (10 mg total) by mouth daily.    Dispense:  90 tablet    Refill:  2     There is no immunization history on file for this patient.  No family history on file.  History  Substance Use Topics  . Smoking status: Current Every Day Smoker -- 0.50 packs/day    Types: Cigarettes  . Smokeless tobacco: Not on file  . Alcohol Use: No    Review of Systems   As noted in HPI  Filed Vitals:   08/24/13 1414  BP: 152/92  Pulse: 95  Temp: 98.3 F (36.8 C)    Physical Exam  Physical Exam  Constitutional: No distress.  Eyes: EOM are normal. Pupils are equal, round, and reactive to light.  Cardiovascular: Normal rate and regular rhythm.   Pulmonary/Chest: Breath sounds normal. No respiratory distress. He has no wheezes. He has no rales.    CBC    Component Value Date/Time  WBC 7.7 12/30/2012 0933   RBC 4.88 12/30/2012 0933   HGB 14.1 12/30/2012 0933   HCT 41.0 12/30/2012 0933   PLT 270 12/30/2012 0933   MCV 84.0 12/30/2012 0933   LYMPHSABS 2.9 12/30/2012 0933   MONOABS 0.6 12/30/2012 0933   EOSABS 0.1 12/30/2012 0933   BASOSABS 0.0 12/30/2012 0933    CMP     Component Value Date/Time   NA 140 05/14/2013 1432   K 3.5 05/14/2013 1432   CL 104 05/14/2013 1432   CO2 29 05/14/2013 1432   GLUCOSE 114* 05/14/2013 1432   BUN 10 05/14/2013 1432   CREATININE 0.90 05/14/2013 1432   CREATININE 1.03 09/24/2012 1642   CALCIUM 9.0 05/14/2013 1432   PROT 7.1 05/14/2013 1432   ALBUMIN 3.9 05/14/2013 1432   AST 18 05/14/2013 1432   ALT 21 05/14/2013 1432   ALKPHOS 62 05/14/2013 1432   BILITOT 0.4 05/14/2013 1432   GFRNONAA 85* 09/24/2012 1642   GFRAA >90 09/24/2012 1642    Lab Results  Component Value Date/Time   CHOL 171 05/14/2013  2:32  PM    No components found with this basename: hga1c    Lab Results  Component Value Date/Time   AST 18 05/14/2013  2:32 PM    Assessment and Plan  Unspecified essential hypertension - Plan: lisinopril-hydrochlorothiazide (PRINZIDE,ZESTORETIC) 20-12.5 MG per tablet, amLODipine (NORVASC) 10 MG tablet, COMPLETE METABOLIC PANEL WITH GFR  Right ankle pain Pain medications when necessary currently being followed up with orthopedics/physical therapy.  Smoking Advised patient to quit smoking , patient is not ready yet.  Unspecified vitamin D deficiency - Plan: Started on Vitamin D, Ergocalciferol, (DRISDOL) 50000 UNITS CAPS for 12 weeks  Other and unspecified hyperlipidemia - Plan: Lipid panel Continue with her simvastatin, repeat blood work on next visit   Return in about 3 months (around 11/24/2013) for hypertension.  Doris Cheadle, MD

## 2013-08-30 ENCOUNTER — Ambulatory Visit: Payer: No Typology Code available for payment source | Attending: Sports Medicine

## 2013-08-30 DIAGNOSIS — M25676 Stiffness of unspecified foot, not elsewhere classified: Secondary | ICD-10-CM | POA: Insufficient documentation

## 2013-08-30 DIAGNOSIS — IMO0001 Reserved for inherently not codable concepts without codable children: Secondary | ICD-10-CM | POA: Insufficient documentation

## 2013-08-30 DIAGNOSIS — M6281 Muscle weakness (generalized): Secondary | ICD-10-CM | POA: Insufficient documentation

## 2013-08-30 DIAGNOSIS — R609 Edema, unspecified: Secondary | ICD-10-CM | POA: Insufficient documentation

## 2013-08-30 DIAGNOSIS — M25673 Stiffness of unspecified ankle, not elsewhere classified: Secondary | ICD-10-CM | POA: Insufficient documentation

## 2013-08-30 DIAGNOSIS — R262 Difficulty in walking, not elsewhere classified: Secondary | ICD-10-CM | POA: Insufficient documentation

## 2013-08-30 DIAGNOSIS — M25579 Pain in unspecified ankle and joints of unspecified foot: Secondary | ICD-10-CM | POA: Insufficient documentation

## 2013-09-02 ENCOUNTER — Ambulatory Visit: Payer: No Typology Code available for payment source | Admitting: Physical Therapy

## 2013-09-06 ENCOUNTER — Ambulatory Visit: Payer: No Typology Code available for payment source | Admitting: Physical Therapy

## 2013-09-09 ENCOUNTER — Ambulatory Visit: Payer: No Typology Code available for payment source | Admitting: Physical Therapy

## 2013-09-13 ENCOUNTER — Ambulatory Visit: Payer: No Typology Code available for payment source

## 2013-09-15 ENCOUNTER — Ambulatory Visit: Payer: No Typology Code available for payment source

## 2013-09-20 ENCOUNTER — Ambulatory Visit: Payer: No Typology Code available for payment source | Admitting: Sports Medicine

## 2013-11-19 ENCOUNTER — Other Ambulatory Visit: Payer: No Typology Code available for payment source

## 2013-11-24 ENCOUNTER — Ambulatory Visit: Payer: No Typology Code available for payment source | Admitting: Internal Medicine

## 2013-12-14 ENCOUNTER — Other Ambulatory Visit: Payer: Medicaid Other

## 2014-01-03 ENCOUNTER — Ambulatory Visit: Payer: Medicaid Other | Admitting: Internal Medicine

## 2014-01-18 ENCOUNTER — Ambulatory Visit: Payer: Self-pay | Attending: Internal Medicine | Admitting: Internal Medicine

## 2014-01-18 ENCOUNTER — Encounter: Payer: Self-pay | Admitting: Internal Medicine

## 2014-01-18 VITALS — BP 155/90 | HR 68 | Temp 98.6°F | Resp 16 | Wt 225.0 lb

## 2014-01-18 DIAGNOSIS — M7731 Calcaneal spur, right foot: Secondary | ICD-10-CM

## 2014-01-18 DIAGNOSIS — M25571 Pain in right ankle and joints of right foot: Secondary | ICD-10-CM

## 2014-01-18 DIAGNOSIS — E559 Vitamin D deficiency, unspecified: Secondary | ICD-10-CM

## 2014-01-18 DIAGNOSIS — M7730 Calcaneal spur, unspecified foot: Secondary | ICD-10-CM

## 2014-01-18 DIAGNOSIS — I10 Essential (primary) hypertension: Secondary | ICD-10-CM

## 2014-01-18 DIAGNOSIS — M25579 Pain in unspecified ankle and joints of unspecified foot: Secondary | ICD-10-CM

## 2014-01-18 DIAGNOSIS — F17200 Nicotine dependence, unspecified, uncomplicated: Secondary | ICD-10-CM

## 2014-01-18 DIAGNOSIS — M7732 Calcaneal spur, left foot: Secondary | ICD-10-CM

## 2014-01-18 DIAGNOSIS — E785 Hyperlipidemia, unspecified: Secondary | ICD-10-CM

## 2014-01-18 MED ORDER — VITAMIN D (ERGOCALCIFEROL) 1.25 MG (50000 UNIT) PO CAPS: 50000.0000 [IU] | ORAL_CAPSULE | ORAL | Status: DC

## 2014-01-18 MED ORDER — LISINOPRIL-HYDROCHLOROTHIAZIDE 20-12.5 MG PO TABS: 1.0000 | ORAL_TABLET | Freq: Every day | ORAL | Status: DC

## 2014-01-18 MED ORDER — AMLODIPINE BESYLATE 10 MG PO TABS: 10.0000 mg | ORAL_TABLET | Freq: Every day | ORAL | Status: DC

## 2014-01-18 NOTE — Progress Notes (Signed)
Patient here for follow up on his hypertension And medication refill 

## 2014-01-18 NOTE — Progress Notes (Signed)
MRN: [id number] Name: Brian Moody  Sex: male Age: 48 y.o. DOB: 12-22-1965  Allergies: Review of patient's allergies indicates no known allergies.  Chief Complaint  Patient presents with  . Follow-up    HPI: Patient is 48 y.o. male who has History of hypertension hyperlipidemia comes today for followup as per patient he then out of his blood pressure medication and is requesting refill today's blood pressure is borderline elevated denies any headache dizziness chest and shortness of breath, patient is to smoke cigarettes, I have advised patient to quit smoking, is also requesting refill on vitamin D as per patient he took it only for 4 weeks, he also history of right ankle fracture and was following up with physical therapy still has some residual pain, patient is requesting to see a podiatrist since he has also heel spur has been bothering him most, in the past as per patient he used to get  steroid injections.  Past Medical History  Diagnosis Date  . Hypertension     History reviewed. No pertinent past surgical history.    Medication List       This list is accurate as of: 01/18/14 12:19 PM.  Always use your most recent med list.               albuterol 108 (90 BASE) MCG/ACT inhaler  Commonly known as:  PROVENTIL HFA;VENTOLIN HFA  Inhale 1 puff into the lungs every 6 (six) hours as needed for wheezing or shortness of breath.     amLODipine 10 MG tablet  Commonly known as:  NORVASC  Take 1 tablet (10 mg total) by mouth daily.     ibuprofen 800 MG tablet  Commonly known as:  ADVIL,MOTRIN  Take 1 tablet (800 mg total) by mouth every 8 (eight) hours as needed.     lisinopril-hydrochlorothiazide 20-12.5 MG per tablet  Commonly known as:  PRINZIDE,ZESTORETIC  Take 1 tablet by mouth daily.     simvastatin 20 MG tablet  Commonly known as:  ZOCOR  Take 1 tablet (20 mg total) by mouth at bedtime.     Vitamin D (Ergocalciferol) 50000 UNITS Caps capsule  Commonly  known as:  DRISDOL  Take 1 capsule (50,000 Units total) by mouth every 7 (seven) days.        Meds ordered this encounter  Medications  . amLODipine (NORVASC) 10 MG tablet    Sig: Take 1 tablet (10 mg total) by mouth daily.    Dispense:  90 tablet    Refill:  2  . lisinopril-hydrochlorothiazide (PRINZIDE,ZESTORETIC) 20-12.5 MG per tablet    Sig: Take 1 tablet by mouth daily.    Dispense:  90 tablet    Refill:  2     There is no immunization history on file for this patient.  History reviewed. No pertinent family history.  History  Substance Use Topics  . Smoking status: Current Every Day Smoker -- 0.50 packs/day    Types: Cigarettes  . Smokeless tobacco: Not on file  . Alcohol Use: No    Review of Systems   As noted in HPI  Filed Vitals:   01/18/14 1139  BP: 155/90  Pulse: 68  Temp: 98.6 F (37 C)  Resp: 16    Physical Exam  Physical Exam  Constitutional: No distress.  Eyes: EOM are normal. Pupils are equal, round, and reactive to light.  Cardiovascular: Normal rate and regular rhythm.   Pulmonary/Chest: Breath sounds normal. No respiratory distress. He has  no wheezes. He has no rales.    CBC    Component Value Date/Time   WBC 7.7 12/30/2012 0933   RBC 4.88 12/30/2012 0933   HGB 14.1 12/30/2012 0933   HCT 41.0 12/30/2012 0933   PLT 270 12/30/2012 0933   MCV 84.0 12/30/2012 0933   LYMPHSABS 2.9 12/30/2012 0933   MONOABS 0.6 12/30/2012 0933   EOSABS 0.1 12/30/2012 0933   BASOSABS 0.0 12/30/2012 0933    CMP     Component Value Date/Time   NA 140 05/14/2013 1432   K 3.5 05/14/2013 1432   CL 104 05/14/2013 1432   CO2 29 05/14/2013 1432   GLUCOSE 114* 05/14/2013 1432   BUN 10 05/14/2013 1432   CREATININE 0.90 05/14/2013 1432   CREATININE 1.03 09/24/2012 1642   CALCIUM 9.0 05/14/2013 1432   PROT 7.1 05/14/2013 1432   ALBUMIN 3.9 05/14/2013 1432   AST 18 05/14/2013 1432   ALT 21 05/14/2013 1432   ALKPHOS 62 05/14/2013 1432   BILITOT 0.4 05/14/2013 1432    GFRNONAA >89 05/14/2013 1432   GFRNONAA 85* 09/24/2012 1642   GFRAA >89 05/14/2013 1432   GFRAA >90 09/24/2012 1642    Lab Results  Component Value Date/Time   CHOL 171 05/14/2013  2:32 PM    No components found with this basename: hga1c    Lab Results  Component Value Date/Time   AST 18 05/14/2013  2:32 PM    Assessment and Plan  Unspecified essential hypertension - Plan: Patient is given refill on his blood pressure medications, also advise for DASH diet amLODipine (NORVASC) 10 MG tablet, lisinopril-hydrochlorothiazide (PRINZIDE,ZESTORETIC) 20-12.5 MG per tablet  Dyslipidemia Blood work is ordered patient will come back for fasting lipid panel currently patient is not taking Zocor.  Smoker Advised patient to quit smoking.  Right ankle pain/ Calcaneal spur of both feet- Plan: Ambulatory referral to Podiatry  Return in about 3 months (around 04/20/2014) for hypertension.  Doris CheadleADVANI, Niranjan Rufener, MD

## 2014-01-18 NOTE — Patient Instructions (Signed)
DASH Eating Plan °DASH stands for "Dietary Approaches to Stop Hypertension." The DASH eating plan is a healthy eating plan that has been shown to reduce high blood pressure (hypertension). Additional health benefits may include reducing the risk of type 2 diabetes mellitus, heart disease, and stroke. The DASH eating plan may also help with weight loss. °WHAT DO I NEED TO KNOW ABOUT THE DASH EATING PLAN? °For the DASH eating plan, you will follow these general guidelines: °· Choose foods with a percent daily value for sodium of less than 5% (as listed on the food label). °· Use salt-free seasonings or herbs instead of table salt or sea salt. °· Check with your health care provider or pharmacist before using salt substitutes. °· Eat lower-sodium products, often labeled as "lower sodium" or "no salt added." °· Eat fresh foods. °· Eat more vegetables, fruits, and low-fat dairy products. °· Choose whole grains. Look for the word "whole" as the first word in the ingredient list. °· Choose fish and skinless chicken or turkey more often than red meat. Limit fish, poultry, and meat to 6 oz (170 g) each day. °· Limit sweets, desserts, sugars, and sugary drinks. °· Choose heart-healthy fats. °· Limit cheese to 1 oz (28 g) per day. °· Eat more home-cooked food and less restaurant, buffet, and fast food. °· Limit fried foods. °· Cook foods using methods other than frying. °· Limit canned vegetables. If you do use them, rinse them well to decrease the sodium. °· When eating at a restaurant, ask that your food be prepared with less salt, or no salt if possible. °WHAT FOODS CAN I EAT? °Seek help from a dietitian for individual calorie needs. °Grains °Whole grain or whole wheat bread. Brown rice. Whole grain or whole wheat pasta. Quinoa, bulgur, and whole grain cereals. Low-sodium cereals. Corn or whole wheat flour tortillas. Whole grain cornbread. Whole grain crackers. Low-sodium crackers. °Vegetables °Fresh or frozen vegetables  (raw, steamed, roasted, or grilled). Low-sodium or reduced-sodium tomato and vegetable juices. Low-sodium or reduced-sodium tomato sauce and paste. Low-sodium or reduced-sodium canned vegetables.  °Fruits °All fresh, canned (in natural juice), or frozen fruits. °Meat and Other Protein Products °Ground beef (85% or leaner), grass-fed beef, or beef trimmed of fat. Skinless chicken or turkey. Ground chicken or turkey. Pork trimmed of fat. All fish and seafood. Eggs. Dried beans, peas, or lentils. Unsalted nuts and seeds. Unsalted canned beans. °Dairy °Low-fat dairy products, such as skim or 1% milk, 2% or reduced-fat cheeses, low-fat ricotta or cottage cheese, or plain low-fat yogurt. Low-sodium or reduced-sodium cheeses. °Fats and Oils °Tub margarines without trans fats. Light or reduced-fat mayonnaise and salad dressings (reduced sodium). Avocado. Safflower, olive, or canola oils. Natural peanut or almond butter. °Other °Unsalted popcorn and pretzels. °The items listed above may not be a complete list of recommended foods or beverages. Contact your dietitian for more options. °WHAT FOODS ARE NOT RECOMMENDED? °Grains °White bread. White pasta. White rice. Refined cornbread. Bagels and croissants. Crackers that contain trans fat. °Vegetables °Creamed or fried vegetables. Vegetables in a cheese sauce. Regular canned vegetables. Regular canned tomato sauce and paste. Regular tomato and vegetable juices. °Fruits °Dried fruits. Canned fruit in light or heavy syrup. Fruit juice. °Meat and Other Protein Products °Fatty cuts of meat. Ribs, chicken wings, bacon, sausage, bologna, salami, chitterlings, fatback, hot dogs, bratwurst, and packaged luncheon meats. Salted nuts and seeds. Canned beans with salt. °Dairy °Whole or 2% milk, cream, half-and-half, and cream cheese. Whole-fat or sweetened yogurt. Full-fat   cheeses or blue cheese. Nondairy creamers and whipped toppings. Processed cheese, cheese spreads, or cheese  curds. °Condiments °Onion and garlic salt, seasoned salt, table salt, and sea salt. Canned and packaged gravies. Worcestershire sauce. Tartar sauce. Barbecue sauce. Teriyaki sauce. Soy sauce, including reduced sodium. Steak sauce. Fish sauce. Oyster sauce. Cocktail sauce. Horseradish. Ketchup and mustard. Meat flavorings and tenderizers. Bouillon cubes. Hot sauce. Tabasco sauce. Marinades. Taco seasonings. Relishes. °Fats and Oils °Butter, stick margarine, lard, shortening, ghee, and bacon fat. Coconut, palm kernel, or palm oils. Regular salad dressings. °Other °Pickles and olives. Salted popcorn and pretzels. °The items listed above may not be a complete list of foods and beverages to avoid. Contact your dietitian for more information. °WHERE CAN I FIND MORE INFORMATION? °[employer], [employer], [employer]: www.nhlbi.nih.gov/health/health-topics/topics/dash/ °Document Released: 05/30/2011 Document Revised: 10/25/2013 Document Reviewed: 04/14/2013 °[employer]® Patient Information ©2015 [employer]. This information is not intended to replace advice given to you by your health care provider. Make sure you discuss any questions you have with your health care provider. ° °

## 2014-01-25 ENCOUNTER — Other Ambulatory Visit: Payer: Self-pay

## 2014-01-25 MED ORDER — IBUPROFEN 800 MG PO TABS: 800.0000 mg | ORAL_TABLET | Freq: Three times a day (TID) | ORAL | Status: DC | PRN

## 2014-02-15 ENCOUNTER — Encounter (HOSPITAL_COMMUNITY): Payer: Self-pay | Admitting: Emergency Medicine

## 2014-02-15 ENCOUNTER — Emergency Department (HOSPITAL_COMMUNITY): Payer: Medicaid Other

## 2014-02-15 ENCOUNTER — Emergency Department (HOSPITAL_COMMUNITY)
Admission: EM | Admit: 2014-02-15 | Discharge: 2014-02-15 | Disposition: A | Discharge status: Home / Self Care | Payer: Medicaid Other | Attending: Emergency Medicine | Admitting: Emergency Medicine

## 2014-02-15 DIAGNOSIS — S99929A Unspecified injury of unspecified foot, initial encounter: Secondary | ICD-10-CM

## 2014-02-15 DIAGNOSIS — I10 Essential (primary) hypertension: Secondary | ICD-10-CM | POA: Insufficient documentation

## 2014-02-15 DIAGNOSIS — F17200 Nicotine dependence, unspecified, uncomplicated: Secondary | ICD-10-CM | POA: Insufficient documentation

## 2014-02-15 DIAGNOSIS — Z79899 Other long term (current) drug therapy: Secondary | ICD-10-CM | POA: Insufficient documentation

## 2014-02-15 DIAGNOSIS — S8990XA Unspecified injury of unspecified lower leg, initial encounter: Secondary | ICD-10-CM | POA: Insufficient documentation

## 2014-02-15 DIAGNOSIS — S93409A Sprain of unspecified ligament of unspecified ankle, initial encounter: Secondary | ICD-10-CM | POA: Insufficient documentation

## 2014-02-15 DIAGNOSIS — X500XXA Overexertion from strenuous movement or load, initial encounter: Secondary | ICD-10-CM | POA: Insufficient documentation

## 2014-02-15 DIAGNOSIS — M109 Gout, unspecified: Secondary | ICD-10-CM | POA: Insufficient documentation

## 2014-02-15 DIAGNOSIS — S99919A Unspecified injury of unspecified ankle, initial encounter: Secondary | ICD-10-CM

## 2014-02-15 DIAGNOSIS — Y939 Activity, unspecified: Secondary | ICD-10-CM | POA: Insufficient documentation

## 2014-02-15 DIAGNOSIS — S93401A Sprain of unspecified ligament of right ankle, initial encounter: Secondary | ICD-10-CM

## 2014-02-15 DIAGNOSIS — Y929 Unspecified place or not applicable: Secondary | ICD-10-CM | POA: Insufficient documentation

## 2014-02-15 MED ORDER — OXYCODONE-ACETAMINOPHEN 5-325 MG PO TABS: 1.0000 | ORAL_TABLET | ORAL | Status: DC | PRN

## 2014-02-15 MED ORDER — MELOXICAM 15 MG PO TABS: 15.0000 mg | ORAL_TABLET | Freq: Every day | ORAL | Status: DC

## 2014-02-15 MED ORDER — OXYCODONE-ACETAMINOPHEN 5-325 MG PO TABS
2.0000 | ORAL_TABLET | Freq: Once | ORAL | Status: AC
  Administered 2014-02-15: 2 via ORAL
  Filled 2014-02-15: qty 2

## 2014-02-15 MED ORDER — COLCHICINE 0.6 MG PO TABS
1.2000 mg | ORAL_TABLET | Freq: Once | ORAL | Status: AC
  Administered 2014-02-15: 1.2 mg via ORAL
  Filled 2014-02-15: qty 2

## 2014-02-15 NOTE — ED Notes (Signed)
Patient transported to X-ray 

## 2014-02-15 NOTE — ED Provider Notes (Signed)
CSN: [id number]     Arrival date & time 02/15/14  1136 History   First MD Initiated Contact with Patient 02/15/14 1248     Chief Complaint  Patient presents with  . Ankle Pain     (Consider location/radiation/quality/duration/timing/severity/associated sxs/prior Treatment) HPI Comments: Patient is a 48 year old male who presents with a 1 week history of right ankle pain. Patient reports having fractured his ankle previously and most recently twisted it 1 week ago. The pain is located in generalized right ankle and does not radiate. The pain is described as aching and severe. The pain started gradually and progressively worsened since the onset. No alleviating/aggravating factors. The patient has tried nothing for symptoms without relief. Associated symptoms include joint swelling. Patient denies fever, headache, NVD. Patient also complains of the same pain in his left great toe. He thinks he has gout.   Patient is a 48 y.o. male presenting with ankle pain.  Ankle Pain Associated symptoms: no fatigue, no fever and no neck pain     Past Medical History  Diagnosis Date  . Hypertension    History reviewed. No pertinent past surgical history. No family history on file. History  Substance Use Topics  . Smoking status: Current Every Day Smoker -- 0.50 packs/day    Types: Cigarettes  . Smokeless tobacco: Not on file  . Alcohol Use: No    Review of Systems  Constitutional: Negative for fever, chills and fatigue.  HENT: Negative for trouble swallowing.   Eyes: Negative for visual disturbance.  Respiratory: Negative for shortness of breath.   Cardiovascular: Negative for chest pain and palpitations.  Gastrointestinal: Negative for nausea, vomiting, abdominal pain and diarrhea.  Genitourinary: Negative for dysuria and difficulty urinating.  Musculoskeletal: Positive for arthralgias and joint swelling. Negative for neck pain.  Skin: Negative for color change.  Neurological: Negative for  dizziness and weakness.  Psychiatric/Behavioral: Negative for dysphoric mood.      Allergies  Review of patient's allergies indicates no known allergies.  Home Medications   Prior to Admission medications   Medication Sig Start Date End Date Taking? Authorizing Provider  albuterol (PROVENTIL HFA;VENTOLIN HFA) 108 (90 BASE) MCG/ACT inhaler Inhale 1 puff into the lungs every 6 (six) hours as needed for wheezing or shortness of breath. 05/19/13   Quentin Angst, MD  amLODipine (NORVASC) 10 MG tablet Take 1 tablet (10 mg total) by mouth daily. 01/18/14   Doris Cheadle, MD  ibuprofen (ADVIL,MOTRIN) 800 MG tablet Take 1 tablet (800 mg total) by mouth every 8 (eight) hours as needed. 01/25/14   Doris Cheadle, MD  lisinopril-hydrochlorothiazide (PRINZIDE,ZESTORETIC) 20-12.5 MG per tablet Take 1 tablet by mouth daily. 01/18/14   Doris Cheadle, MD  simvastatin (ZOCOR) 20 MG tablet Take 1 tablet (20 mg total) by mouth at bedtime. 05/14/13   Richarda Overlie, MD  Vitamin D, Ergocalciferol, (DRISDOL) 50000 UNITS CAPS capsule Take 1 capsule (50,000 Units total) by mouth every 7 (seven) days. 01/18/14   Doris Cheadle, MD   BP 154/92  Pulse 63  Temp(Src) 98 F (36.7 C)  Resp 16  SpO2 99% Physical Exam  Nursing note and vitals reviewed. Constitutional: He is oriented to person, place, and time. He appears well-developed and well-nourished. No distress.  HENT:  Head: Normocephalic and atraumatic.  Eyes: Conjunctivae and EOM are normal.  Neck: Normal range of motion.  Cardiovascular: Normal rate, regular rhythm and intact distal pulses.  Exam reveals no gallop and no friction rub.   No murmur  heard. Pulmonary/Chest: Effort normal and breath sounds normal. He has no wheezes. He has no rales. He exhibits no tenderness.  Musculoskeletal:  Limited ROM of right ankle due to pain. No obvious deformity. Generalized tenderness to palpation of right ankle.   Left great toe tender, swollen, and warm to touch.  No obvious deformity.   Neurological: He is alert and oriented to person, place, and time. Coordination normal.  Speech is goal-oriented. Moves limbs without ataxia.   Skin: Skin is warm and dry.  Psychiatric: He has a normal mood and affect. His behavior is normal.    ED Course  Procedures (including critical care time)  SPLINT APPLICATION Date/Time: 1:29 PM Authorized by: Emilia Beck Consent: Verbal consent obtained. Risks and benefits: risks, benefits and alternatives were discussed Consent given by: patient Splint applied by: orthopedic technician Location details: right ankle Splint type: ASO brace Supplies used: ASO brace Post-procedure: The splinted body part was neurovascularly unchanged following the procedure. Patient tolerance: Patient tolerated the procedure well with no immediate complications.     Labs Review Labs Reviewed - No data to display  Imaging Review Dg Ankle Complete Right  02/15/2014   CLINICAL DATA:  Pain and swelling.  EXAM: RIGHT ANKLE - COMPLETE 3+ VIEW  COMPARISON:  06/07/2013.  FINDINGS: Old avulsion fractures of the medial and lateral malleoli. No acute abnormalities identified.  IMPRESSION: Old avulsion fractures medial and lateral malleoli. No acute abnormality.   Electronically Signed   By: Maisie Fus  Register   On: 02/15/2014 12:55     EKG Interpretation None      MDM   Final diagnoses:  Right ankle sprain, initial encounter  Gout of big toe    1:21 PM Patient's xray shows old avulsion fractures of right ankle without acute changes. Patient will have ASO brace for support. Patient wants to be treated for gout in his left big toe. I will order colchicine and percocet here. Patient has a follow up with his PCP in 2 days. Patient advised to discuss his persistent ankle pain and gout.    Emilia Beck, PA-C 02/15/14 1335

## 2014-02-15 NOTE — ED Notes (Signed)
Pt has hx if multiple fx's to right ankle through the years. States he turned his ankle again 1 week ago. No deformity noted.

## 2014-02-15 NOTE — Discharge Instructions (Signed)
Take Mobic as needed for pain. You may also take Percocet for extra pain relief as needed. Refer to attached documents for more information. Follow up with your doctor for further evaluation.

## 2014-02-15 NOTE — ED Notes (Signed)
Rt ankle pain states that he twisted it x 1 week ago states that ankle was in cast a couple of months ago  Hurts to walk on it and somethimes it swells

## 2014-02-16 NOTE — ED Provider Notes (Signed)
Medical screening examination/treatment/procedure(s) were performed by non-physician practitioner and as supervising physician I was immediately available for consultation/collaboration.    Linwood Dibbles, MD 02/16/14 [phone]

## 2014-02-17 ENCOUNTER — Other Ambulatory Visit: Payer: Self-pay | Admitting: Podiatry

## 2014-02-17 ENCOUNTER — Ambulatory Visit: Payer: Medicaid Other | Attending: Podiatry

## 2014-02-17 MED ORDER — COLCHICINE 0.6 MG PO TABS: 0.6000 mg | ORAL_TABLET | Freq: Two times a day (BID) | ORAL | Status: DC

## 2014-04-12 ENCOUNTER — Ambulatory Visit: Payer: Self-pay | Attending: Internal Medicine | Admitting: Internal Medicine

## 2014-04-12 ENCOUNTER — Encounter: Payer: Self-pay | Admitting: Internal Medicine

## 2014-04-12 VITALS — BP 165/90 | HR 70 | Temp 98.0°F | Resp 16 | Wt 236.4 lb

## 2014-04-12 DIAGNOSIS — M25571 Pain in right ankle and joints of right foot: Secondary | ICD-10-CM

## 2014-04-12 DIAGNOSIS — Z8739 Personal history of other diseases of the musculoskeletal system and connective tissue: Secondary | ICD-10-CM

## 2014-04-12 DIAGNOSIS — F17210 Nicotine dependence, cigarettes, uncomplicated: Secondary | ICD-10-CM | POA: Insufficient documentation

## 2014-04-12 DIAGNOSIS — I10 Essential (primary) hypertension: Secondary | ICD-10-CM | POA: Insufficient documentation

## 2014-04-12 DIAGNOSIS — Z8639 Personal history of other endocrine, nutritional and metabolic disease: Secondary | ICD-10-CM

## 2014-04-12 DIAGNOSIS — R109 Unspecified abdominal pain: Secondary | ICD-10-CM

## 2014-04-12 LAB — POCT URINALYSIS DIPSTICK
BILIRUBIN UA: NEGATIVE
Blood, UA: NEGATIVE
Glucose, UA: NEGATIVE
Ketones, UA: NEGATIVE
NITRITE UA: NEGATIVE
Protein, UA: NEGATIVE
Spec Grav, UA: 1.01
Urobilinogen, UA: 0.2
pH, UA: 6.5

## 2014-04-12 MED ORDER — IBUPROFEN 800 MG PO TABS: 800.0000 mg | ORAL_TABLET | Freq: Three times a day (TID) | ORAL | Status: DC | PRN

## 2014-04-12 MED ORDER — ACETAMINOPHEN-CODEINE #2 300-15 MG PO TABS: 1.0000 | ORAL_TABLET | ORAL | Status: DC | PRN

## 2014-04-12 MED ORDER — CIPROFLOXACIN HCL 500 MG PO TABS: 500.0000 mg | ORAL_TABLET | Freq: Two times a day (BID) | ORAL | Status: DC

## 2014-04-12 NOTE — Progress Notes (Signed)
MRN: [id number] Name: Brian Moody  Sex: male Age: 48 y.o. DOB: 01/23/1966  Allergies: Review of patient's allergies indicates no known allergies.  Chief Complaint  Patient presents with  . Ankle Pain    HPI: Patient is 48 y.o. male who has to of right ankle fracture, 2 months ago patient went to the emergency room was having the symptoms of pain, EMR reviewed had an x-ray done which reported old fracture no acute findings patient was prescribed her pain medication and ankle brace which she has been wearing, complaining of persistent pain, patient used to follow with her sports medicine and after that he was following up with physical therapy at the moment he was advised to get an x-ray done and have followup which she has not done yet, he also reported to have left flank pain denies any fever chills denies any dysuria. Patient also reported to have history of gout. Patient has history of hypertension as per and is not taking the blood pressure medications today.  Past Medical History  Diagnosis Date  . Hypertension     History reviewed. No pertinent past surgical history.    Medication List       This list is accurate as of: 04/12/14  4:45 PM.  Always use your most recent med list.               acetaminophen-codeine 300-15 MG per tablet  Commonly known as:  TYLENOL #2  Take 1 tablet by mouth every 4 (four) hours as needed for moderate pain.     albuterol 108 (90 BASE) MCG/ACT inhaler  Commonly known as:  PROVENTIL HFA;VENTOLIN HFA  Inhale 1 puff into the lungs every 6 (six) hours as needed for wheezing or shortness of breath.     amLODipine 10 MG tablet  Commonly known as:  NORVASC  Take 1 tablet (10 mg total) by mouth daily.     ciprofloxacin 500 MG tablet  Commonly known as:  CIPRO  Take 1 tablet (500 mg total) by mouth 2 (two) times daily.     colchicine 0.6 MG tablet  Take 1 tablet (0.6 mg total) by mouth 2 (two) times daily.     ibuprofen 800 MG tablet   Commonly known as:  ADVIL,MOTRIN  Take 1 tablet (800 mg total) by mouth every 8 (eight) hours as needed.     lisinopril-hydrochlorothiazide 20-12.5 MG per tablet  Commonly known as:  PRINZIDE,ZESTORETIC  Take 1 tablet by mouth daily.     meloxicam 15 MG tablet  Commonly known as:  MOBIC  Take 1 tablet (15 mg total) by mouth daily.     oxyCODONE-acetaminophen 5-325 MG per tablet  Commonly known as:  PERCOCET/ROXICET  Take 1-2 tablets by mouth every 4 (four) hours as needed for moderate pain or severe pain.     simvastatin 20 MG tablet  Commonly known as:  ZOCOR  Take 1 tablet (20 mg total) by mouth at bedtime.     Vitamin D (Ergocalciferol) 50000 UNITS Caps capsule  Commonly known as:  DRISDOL  Take 1 capsule (50,000 Units total) by mouth every 7 (seven) days.        Meds ordered this encounter  Medications  . ibuprofen (ADVIL,MOTRIN) 800 MG tablet    Sig: Take 1 tablet (800 mg total) by mouth every 8 (eight) hours as needed.    Dispense:  60 tablet    Refill:  1  . acetaminophen-codeine (TYLENOL #2) 300-15 MG per tablet  Sig: Take 1 tablet by mouth every 4 (four) hours as needed for moderate pain.    Dispense:  30 tablet    Refill:  0  . ciprofloxacin (CIPRO) 500 MG tablet    Sig: Take 1 tablet (500 mg total) by mouth 2 (two) times daily.    Dispense:  10 tablet    Refill:  0     There is no immunization history on file for this patient.  History reviewed. No pertinent family history.  History  Substance Use Topics  . Smoking status: Current Every Day Smoker -- 0.50 packs/day    Types: Cigarettes  . Smokeless tobacco: Not on file  . Alcohol Use: No    Review of Systems   As noted in HPI  Filed Vitals:   04/12/14 1442  BP: 165/90  Pulse: 70  Temp: 98 F (36.7 C)  Resp: 16    Physical Exam  Physical Exam  Eyes: EOM are normal. Pupils are equal, round, and reactive to light.  Cardiovascular: Normal rate and regular rhythm.   Pulmonary/Chest:  Breath sounds normal. No respiratory distress. He has no wheezes. He has no rales.  Abdominal: There is no tenderness. There is no rebound.  No CVA tenderness  Musculoskeletal:  SLR negative  Right ankle minimal swelling no erythema, tenderness on the medial and lateral malleolus.    CBC    Component Value Date/Time   WBC 7.7 12/30/2012 0933   RBC 4.88 12/30/2012 0933   HGB 14.1 12/30/2012 0933   HCT 41.0 12/30/2012 0933   PLT 270 12/30/2012 0933   MCV 84.0 12/30/2012 0933   LYMPHSABS 2.9 12/30/2012 0933   MONOABS 0.6 12/30/2012 0933   EOSABS 0.1 12/30/2012 0933   BASOSABS 0.0 12/30/2012 0933    CMP     Component Value Date/Time   NA 140 05/14/2013 1432   K 3.5 05/14/2013 1432   CL 104 05/14/2013 1432   CO2 29 05/14/2013 1432   GLUCOSE 114* 05/14/2013 1432   BUN 10 05/14/2013 1432   CREATININE 0.90 05/14/2013 1432   CREATININE 1.03 09/24/2012 1642   CALCIUM 9.0 05/14/2013 1432   PROT 7.1 05/14/2013 1432   ALBUMIN 3.9 05/14/2013 1432   AST 18 05/14/2013 1432   ALT 21 05/14/2013 1432   ALKPHOS 62 05/14/2013 1432   BILITOT 0.4 05/14/2013 1432   GFRNONAA >89 05/14/2013 1432   GFRNONAA 85* 09/24/2012 1642   GFRAA >89 05/14/2013 1432   GFRAA >90 09/24/2012 1642    Lab Results  Component Value Date/Time   CHOL 171 05/14/2013  2:32 PM    No components found with this basename: hga1c    Lab Results  Component Value Date/Time   AST 18 05/14/2013  2:32 PM    Assessment and Plan  Right ankle pain - Plan: ibuprofen (ADVIL,MOTRIN) 800 MG tablet, acetaminophen-codeine (TYLENOL #2) 300-15 MG per tablet, Cane adjustable wide base quad patient is again advised to have followup with the physical therapist  Flank pain - Plan:  Results for orders placed in visit on 04/12/14  POCT URINALYSIS DIPSTICK      Result Value Ref Range   Color, UA yellow     Clarity, UA clear     Glucose, UA neg     Bilirubin, UA neg     Ketones, UA neg     Spec Grav, UA 1.010     Blood, UA neg     pH, UA 6.5       Protein,  UA neg     Urobilinogen, UA 0.2     Nitrite, UA neg     Leukocytes, UA moderate (2+)     Urinalysis Dipstick is positive for leukocyte esterase, ciprofloxacin (CIPRO) 500 MG tablet, Urine will be sent for culture  History of gout - Plan: Will check Uric Acid level   Follow up as scheduled. Doris Cheadle, MD

## 2014-04-12 NOTE — Progress Notes (Signed)
Patient complains of right ankle pain Was recently seen in the ed for the pain and they gave him And ankle brace Complains of right sided flank pain that started last week Rash to his left leg

## 2014-04-13 LAB — URINE CULTURE
Colony Count: NO GROWTH
ORGANISM ID, BACTERIA: NO GROWTH

## 2014-04-13 LAB — URIC ACID: Uric Acid, Serum: 4.4 mg/dL (ref 4.0–7.8)

## 2014-06-01 NOTE — Progress Notes (Unsigned)
[facility] for Prior [facility] for Acetaminophen-Codeine #2.  [facility] Request for Prior Approval Form completed. Awaiting decision.

## 2014-07-01 ENCOUNTER — Encounter (HOSPITAL_COMMUNITY): Payer: Self-pay | Admitting: *Deleted

## 2014-07-01 ENCOUNTER — Emergency Department (HOSPITAL_COMMUNITY)
Admission: EM | Admit: 2014-07-01 | Discharge: 2014-07-01 | Disposition: A | Discharge status: Home / Self Care | Payer: Medicaid Other | Attending: Emergency Medicine | Admitting: Emergency Medicine

## 2014-07-01 ENCOUNTER — Emergency Department (HOSPITAL_COMMUNITY): Payer: Medicaid Other

## 2014-07-01 DIAGNOSIS — I10 Essential (primary) hypertension: Secondary | ICD-10-CM | POA: Insufficient documentation

## 2014-07-01 DIAGNOSIS — Z79899 Other long term (current) drug therapy: Secondary | ICD-10-CM | POA: Insufficient documentation

## 2014-07-01 DIAGNOSIS — A599 Trichomoniasis, unspecified: Secondary | ICD-10-CM

## 2014-07-01 DIAGNOSIS — J40 Bronchitis, not specified as acute or chronic: Secondary | ICD-10-CM | POA: Insufficient documentation

## 2014-07-01 DIAGNOSIS — R059 Cough, unspecified: Secondary | ICD-10-CM

## 2014-07-01 DIAGNOSIS — R05 Cough: Secondary | ICD-10-CM

## 2014-07-01 DIAGNOSIS — Z720 Tobacco use: Secondary | ICD-10-CM | POA: Insufficient documentation

## 2014-07-01 DIAGNOSIS — A598 Trichomoniasis of other sites: Secondary | ICD-10-CM | POA: Insufficient documentation

## 2014-07-01 LAB — COMPREHENSIVE METABOLIC PANEL
ALT: 27 U/L (ref 0–53)
ANION GAP: 8 (ref 5–15)
AST: 21 U/L (ref 0–37)
Albumin: 3.9 g/dL (ref 3.5–5.2)
Alkaline Phosphatase: 65 U/L (ref 39–117)
BUN: 18 mg/dL (ref 6–23)
CALCIUM: 9.3 mg/dL (ref 8.4–10.5)
CHLORIDE: 100 meq/L (ref 96–112)
CO2: 26 mmol/L (ref 19–32)
Creatinine, Ser: 1.27 mg/dL (ref 0.50–1.35)
GFR calc non Af Amer: 65 mL/min — ABNORMAL LOW (ref >90)
GFR, EST AFRICAN AMERICAN: 75 mL/min — AB (ref >90)
Glucose, Bld: 109 mg/dL — ABNORMAL HIGH (ref 70–99)
Potassium: 3.5 mmol/L (ref 3.5–5.1)
SODIUM: 134 mmol/L — AB (ref 135–145)
TOTAL PROTEIN: 7.9 g/dL (ref 6.0–8.3)
Total Bilirubin: 0.7 mg/dL (ref 0.3–1.2)

## 2014-07-01 LAB — CBC WITH DIFFERENTIAL/PLATELET
Basophils Absolute: 0 10*3/uL (ref 0.0–0.1)
Basophils Relative: 0 % (ref 0–1)
EOS ABS: 0.1 10*3/uL (ref 0.0–0.7)
EOS PCT: 1 % (ref 0–5)
HCT: 41.4 % (ref 39.0–52.0)
Hemoglobin: 14.7 g/dL (ref 13.0–17.0)
LYMPHS ABS: 3.1 10*3/uL (ref 0.7–4.0)
LYMPHS PCT: 24 % (ref 12–46)
MCH: 30.8 pg (ref 26.0–34.0)
MCHC: 35.5 g/dL (ref 30.0–36.0)
MCV: 86.6 fL (ref 78.0–100.0)
MONO ABS: 1 10*3/uL (ref 0.1–1.0)
Monocytes Relative: 8 % (ref 3–12)
NEUTROS ABS: 8.5 10*3/uL — AB (ref 1.7–7.7)
Neutrophils Relative %: 67 % (ref 43–77)
Platelets: 283 10*3/uL (ref 150–400)
RBC: 4.78 MIL/uL (ref 4.22–5.81)
RDW: 13.4 % (ref 11.5–15.5)
WBC: 12.7 10*3/uL — AB (ref 4.0–10.5)

## 2014-07-01 LAB — URINALYSIS, ROUTINE W REFLEX MICROSCOPIC
Glucose, UA: NEGATIVE mg/dL
HGB URINE DIPSTICK: NEGATIVE
Ketones, ur: 15 mg/dL — AB
Nitrite: NEGATIVE
Protein, ur: NEGATIVE mg/dL
SPECIFIC GRAVITY, URINE: 1.033 — AB (ref 1.005–1.030)
Urobilinogen, UA: 1 mg/dL (ref 0.0–1.0)
pH: 5.5 (ref 5.0–8.0)

## 2014-07-01 LAB — URINE MICROSCOPIC-ADD ON

## 2014-07-01 LAB — I-STAT CG4 LACTIC ACID, ED: Lactic Acid, Venous: 1.28 mmol/L (ref 0.5–2.2)

## 2014-07-01 MED ORDER — ALBUTEROL SULFATE HFA 108 (90 BASE) MCG/ACT IN AERS: 1.0000 | INHALATION_SPRAY | Freq: Four times a day (QID) | RESPIRATORY_TRACT | Status: DC | PRN

## 2014-07-01 MED ORDER — ONDANSETRON HCL 4 MG PO TABS
8.0000 mg | ORAL_TABLET | Freq: Once | ORAL | Status: AC
  Administered 2014-07-01: 8 mg via ORAL
  Filled 2014-07-01: qty 2

## 2014-07-01 MED ORDER — METRONIDAZOLE 500 MG PO TABS
2000.0000 mg | ORAL_TABLET | Freq: Once | ORAL | Status: AC
  Administered 2014-07-01: 2000 mg via ORAL
  Filled 2014-07-01: qty 4

## 2014-07-01 MED ORDER — PREDNISONE 20 MG PO TABS
60.0000 mg | ORAL_TABLET | Freq: Once | ORAL | Status: AC
  Administered 2014-07-01: 60 mg via ORAL
  Filled 2014-07-01: qty 3

## 2014-07-01 MED ORDER — BENZONATATE 100 MG PO CAPS: 100.0000 mg | ORAL_CAPSULE | Freq: Three times a day (TID) | ORAL | Status: DC

## 2014-07-01 MED ORDER — SODIUM CHLORIDE 0.9 % IV BOLUS (SEPSIS): 1000.0000 mL | Freq: Once | INTRAVENOUS | Status: DC

## 2014-07-01 MED ORDER — PREDNISONE 20 MG PO TABS: 20.0000 mg | ORAL_TABLET | Freq: Every day | ORAL | Status: DC

## 2014-07-01 MED ORDER — ACETAMINOPHEN 325 MG PO TABS
650.0000 mg | ORAL_TABLET | Freq: Four times a day (QID) | ORAL | Status: DC | PRN
  Administered 2014-07-01: 650 mg via ORAL
  Filled 2014-07-01: qty 2

## 2014-07-01 MED ORDER — ALBUTEROL SULFATE HFA 108 (90 BASE) MCG/ACT IN AERS
4.0000 | INHALATION_SPRAY | Freq: Once | RESPIRATORY_TRACT | Status: AC
  Administered 2014-07-01: 4 via RESPIRATORY_TRACT
  Filled 2014-07-01: qty 6.7

## 2014-07-01 NOTE — ED Notes (Signed)
Discharge instructions and prescriptions given  Voiced understanding. 

## 2014-07-01 NOTE — ED Provider Notes (Signed)
CSN: [id number]     Arrival date & time 07/01/14  1754 History   First MD Initiated Contact with Patient 07/01/14 2202     Chief Complaint  Patient presents with  . Cough     (Consider location/radiation/quality/duration/timing/severity/associated sxs/prior Treatment) Patient is a 49 y.o. male presenting with cough. The history is provided by the patient.  Cough Cough characteristics:  Productive Sputum characteristics:  Green Severity:  Moderate Onset quality:  Gradual Duration:  1 week Timing:  Constant Progression:  Unchanged Chronicity:  New Smoker: yes   Context: upper respiratory infection   Relieved by:  Nothing Worsened by:  Smoking Ineffective treatments:  None tried Associated symptoms: fever and wheezing   Associated symptoms: no chest pain, no diaphoresis, no headaches, no myalgias, no rash, no shortness of breath, no sinus congestion and no sore throat     Past Medical History  Diagnosis Date  . Hypertension    History reviewed. No pertinent past surgical history. History reviewed. No pertinent family history. History  Substance Use Topics  . Smoking status: Current Every Day Smoker -- 0.50 packs/day    Types: Cigarettes  . Smokeless tobacco: Not on file  . Alcohol Use: No    Review of Systems  Constitutional: Positive for fever. Negative for diaphoresis, activity change and appetite change.  HENT: Negative for facial swelling, sore throat, tinnitus, trouble swallowing and voice change.   Eyes: Negative for pain, redness and visual disturbance.  Respiratory: Positive for cough and wheezing. Negative for chest tightness and shortness of breath.   Cardiovascular: Negative for chest pain, palpitations and leg swelling.  Gastrointestinal: Negative for nausea, vomiting, abdominal pain, diarrhea, constipation and abdominal distention.  Endocrine: Negative.   Genitourinary: Positive for dysuria. Negative for decreased urine volume, scrotal swelling and  testicular pain.  Musculoskeletal: Negative for myalgias, back pain and gait problem.  Skin: Negative.  Negative for rash.  Neurological: Negative.  Negative for dizziness, tremors, weakness and headaches.  Psychiatric/Behavioral: Negative for suicidal ideas, hallucinations and self-injury. The patient is not nervous/anxious.       Allergies  Review of patient's allergies indicates no known allergies.  Home Medications   Prior to Admission medications   Medication Sig Start Date End Date Taking? Authorizing Provider  acetaminophen-codeine (TYLENOL #2) 300-15 MG per tablet Take 1 tablet by mouth every 4 (four) hours as needed for moderate pain. 04/12/14   Doris Cheadle, MD  albuterol (PROVENTIL HFA;VENTOLIN HFA) 108 (90 BASE) MCG/ACT inhaler Inhale 1 puff into the lungs every 6 (six) hours as needed for wheezing or shortness of breath. 05/19/13   Quentin Angst, MD  albuterol (PROVENTIL HFA;VENTOLIN HFA) 108 (90 BASE) MCG/ACT inhaler Inhale 1-2 puffs into the lungs every 6 (six) hours as needed for wheezing. 07/01/14   Rolland Porter, MD  amLODipine (NORVASC) 10 MG tablet Take 1 tablet (10 mg total) by mouth daily. 01/18/14   Doris Cheadle, MD  benzonatate (TESSALON) 100 MG capsule Take 1 capsule (100 mg total) by mouth every 8 (eight) hours. 07/01/14   Rolland Porter, MD  ciprofloxacin (CIPRO) 500 MG tablet Take 1 tablet (500 mg total) by mouth 2 (two) times daily. 04/12/14   Doris Cheadle, MD  colchicine 0.6 MG tablet Take 1 tablet (0.6 mg total) by mouth 2 (two) times daily. 02/17/14   Myeong Sheard, DPM  ibuprofen (ADVIL,MOTRIN) 800 MG tablet Take 1 tablet (800 mg total) by mouth every 8 (eight) hours as needed. 04/12/14   Doris Cheadle, MD  lisinopril-hydrochlorothiazide (  PRINZIDE,ZESTORETIC) 20-12.5 MG per tablet Take 1 tablet by mouth daily. 01/18/14   Doris Cheadle, MD  meloxicam (MOBIC) 15 MG tablet Take 1 tablet (15 mg total) by mouth daily. 02/15/14   Emilia Beck, PA-C   oxyCODONE-acetaminophen (PERCOCET/ROXICET) 5-325 MG per tablet Take 1-2 tablets by mouth every 4 (four) hours as needed for moderate pain or severe pain. 02/15/14   Emilia Beck, PA-C  predniSONE (DELTASONE) 20 MG tablet Take 1 tablet (20 mg total) by mouth daily with breakfast. 1 p bid x 5 days 07/01/14   Rolland Porter, MD  simvastatin (ZOCOR) 20 MG tablet Take 1 tablet (20 mg total) by mouth at bedtime. 05/14/13   Richarda Overlie, MD  Vitamin D, Ergocalciferol, (DRISDOL) 50000 UNITS CAPS capsule Take 1 capsule (50,000 Units total) by mouth every 7 (seven) days. 01/18/14   Doris Cheadle, MD   BP 123/70 mmHg  Pulse 89  Temp(Src) 98.8 F (37.1 C) (Oral)  Resp 18  SpO2 97% Physical Exam  Constitutional: He is oriented to person, place, and time. He appears well-developed and well-nourished. No distress.  HENT:  Head: Normocephalic and atraumatic.  Right Ear: External ear normal.  Left Ear: External ear normal.  Nose: Nose normal.  Mouth/Throat: Oropharynx is clear and moist.  Eyes: Conjunctivae and EOM are normal. Pupils are equal, round, and reactive to light. No scleral icterus.  Neck: Normal range of motion. Neck supple. No JVD present. No tracheal deviation present. No thyromegaly present.  Cardiovascular: Normal rate and intact distal pulses.  Exam reveals no gallop and no friction rub.   No murmur heard. Pulmonary/Chest: Effort normal. No stridor. No respiratory distress. He has wheezes (very mild expiratory wheezing in bilateral apical air fields). He has no rales. He exhibits tenderness.  Abdominal: Soft. He exhibits no distension. There is no tenderness. There is no rebound and no guarding.  Genitourinary: Penis normal.  Musculoskeletal: Normal range of motion. He exhibits no edema or tenderness.  Neurological: He is alert and oriented to person, place, and time. No cranial nerve deficit. He exhibits normal muscle tone. Coordination normal.  5/5 strength in all 4 extremities. Normal  Gait.   Skin: Skin is warm and dry. No rash noted. He is not diaphoretic.  Psychiatric: He has a normal mood and affect. His behavior is normal.  Nursing note and vitals reviewed.   ED Course  Procedures (including critical care time) Labs Review Labs Reviewed  CBC WITH DIFFERENTIAL - Abnormal; Notable for the following:    WBC 12.7 (*)    Neutro Abs 8.5 (*)    All other components within normal limits  COMPREHENSIVE METABOLIC PANEL - Abnormal; Notable for the following:    Sodium 134 (*)    Glucose, Bld 109 (*)    GFR calc non Af Amer 65 (*)    GFR calc Af Amer 75 (*)    All other components within normal limits  URINALYSIS, ROUTINE W REFLEX MICROSCOPIC - Abnormal; Notable for the following:    Color, Urine AMBER (*)    APPearance CLOUDY (*)    Specific Gravity, Urine 1.033 (*)    Bilirubin Urine SMALL (*)    Ketones, ur 15 (*)    Leukocytes, UA LARGE (*)    All other components within normal limits  URINE MICROSCOPIC-ADD ON - Abnormal; Notable for the following:    Bacteria, UA FEW (*)    Casts HYALINE CASTS (*)    All other components within normal limits  I-STAT CG4 LACTIC  ACID, ED    Imaging Review Dg Chest 2 View  07/01/2014   CLINICAL DATA:  49 year old male with 2 week history of cough and 2 day history of mid chest pain. Fevers and chills since yesterday evening.  EXAM: CHEST  2 VIEW  COMPARISON:  Chest x-ray 12/30/2012.  FINDINGS: Diffuse peribronchial cuffing. Lung volumes are normal. No consolidative airspace disease. No pleural effusions. No pneumothorax. No pulmonary nodule or mass noted. Pulmonary vasculature and the cardiomediastinal silhouette are within normal limits.  IMPRESSION: 1. Diffuse peribronchial cuffing, suggestive of acute bronchitis.   Electronically Signed   By: Trudie Reedaniel  Entrikin M.D.   On: 07/01/2014 18:52     EKG Interpretation None      MDM   Final diagnoses:  Bronchitis  Trichomonal infection    The patient is a 49 year old male who  presents with 7 days of cough which was originally nonproductive but has become productive of green sputum as well as 3 days of burning dysuria. Patient is afebrile with vital signs stable and appears well. Exam shows some mild wheezing which completely resolves with albuterol. Chest x-ray is consistent with acute bronchitis. UA is positive for Trichomonas which is treated with a 1 time dose of Flagyl in the ED. Feel patient is appropriate for discharge home with a steroid burst, albuterol inhaler, PCP follow-up, and standard ED return precautions. Patient was also given standard STD instructions including using protection and having all partners tested. Patient reports understanding and agreement with this plan.  Patient seen with attending, Dr. Fayrene FearingJames, who oversaw clinical decision making.     Lula OlszewskiMike Warrick Llera, MD 07/01/14 2340  Rolland PorterMark James, MD 07/11/14 [phone]

## 2014-07-01 NOTE — ED Notes (Signed)
No answer

## 2014-07-01 NOTE — ED Provider Notes (Signed)
Pt seen and evaluated.  D/W Dr. Carron CurieGoeble.  History reviewed. Reports a cough for the last week. Exam with wheezing and prolongation diffuse rhonchi. X-ray shows per bronchial cuffing consistent with bronchitis. Given albuterol MDI. Plan will be steroids, cough suppressants, MDI, expectant management. He is not hypoxemic or febrile here.  Rolland PorterMark Athelene Hursey, MD 07/01/14 [phone]

## 2014-07-01 NOTE — ED Notes (Signed)
Graham crackers and Sprite given to patient before giving meds.  Instructed to eat when he gets home.  Voiced understanding

## 2014-07-01 NOTE — Discharge Instructions (Signed)
Cough, Adult  A cough is a reflex. It helps you clear your throat and airways. A cough can help heal your body. A cough can last 2 or 3 weeks (acute) or may last more than 8 weeks (chronic). Some common causes of a cough can include an infection, allergy, or a cold. HOME CARE  Only take medicine as told by your doctor.  If given, take your medicines (antibiotics) as told. Finish them even if you start to feel better.  Use a cold steam vaporizer or humidifier in your home. This can help loosen thick spit (secretions).  Sleep so you are almost sitting up (semi-upright). Use pillows to do this. This helps reduce coughing.  Rest as needed.  Stop smoking if you smoke. GET HELP RIGHT AWAY IF:  You have yellowish-white fluid (pus) in your thick spit.  Your cough gets worse.  Your medicine does not reduce coughing, and you are losing sleep.  You cough up blood.  You have trouble breathing.  Your pain gets worse and medicine does not help.  You have a fever. MAKE SURE YOU:   Understand these instructions.  Will watch your condition.  Will get help right away if you are not doing well or get worse. Document Released: 02/21/2011 Document Revised: 10/25/2013 Document Reviewed: 02/21/2011 [facility] Patient Information 2015 [employer] [place]. This information is not intended to replace advice given to you by your health care provider. Make sure you discuss any questions you have with your health care provider.  Trichomoniasis Trichomoniasis is an infection caused by an organism called Trichomonas. The infection can affect both women and men. In women, the outer male genitalia and the vagina are affected. In men, the penis is mainly affected, but the prostate and other reproductive organs can also be involved. Trichomoniasis is a sexually transmitted infection (STI) and is most often passed to another person through sexual contact.  RISK FACTORS  Having unprotected sexual  intercourse.  Having sexual intercourse with an infected partner. SIGNS AND SYMPTOMS  Symptoms of trichomoniasis in women include:  Abnormal gray-green frothy vaginal discharge.  Itching and irritation of the vagina.  Itching and irritation of the area outside the vagina. Symptoms of trichomoniasis in men include:   Penile discharge with or without pain.  Pain during urination. This results from inflammation of the urethra. DIAGNOSIS  Trichomoniasis may be found during a Pap test or physical exam. Your health care provider may use one of the following methods to help diagnose this infection:  Examining vaginal discharge under a microscope. For men, urethral discharge would be examined.  Testing the pH of the vagina with a test tape.  Using a vaginal swab test that checks for the Trichomonas organism. A test is available that provides results within a few minutes.  Doing a culture test for the organism. This is not usually needed. TREATMENT   You may be given medicine to fight the infection. Women should inform their health care provider if they could be or are pregnant. Some medicines used to treat the infection should not be taken during pregnancy.  Your health care provider may recommend over-the-counter medicines or creams to decrease itching or irritation.  Your sexual partner will need to be treated if infected. HOME CARE INSTRUCTIONS   Take medicines only as directed by your health care provider.  Take over-the-counter medicine for itching or irritation as directed by your health care provider.  Do not have sexual intercourse while you have the infection.  Women should  not douche or wear tampons while they have the infection.  Discuss your infection with your partner. Your partner may have gotten the infection from you, or you may have gotten it from your partner.  Have your sex partner get examined and treated if necessary.  Practice safe, informed, and protected  sex.  See your health care provider for other STI testing. SEEK MEDICAL CARE IF:   You still have symptoms after you finish your medicine.  You develop abdominal pain.  You have pain when you urinate.  You have bleeding after sexual intercourse.  You develop a rash.  Your medicine makes you sick or makes you throw up (vomit). MAKE SURE YOU:  Understand these instructions.  Will watch your condition.  Will get help right away if you are not doing well or get worse. Document Released: 12/04/2000 Document Revised: 10/25/2013 Document Reviewed: 03/22/2013 [facility] Patient Information 2015 [place], [place]. This information is not intended to replace advice given to you by your health care provider. Make sure you discuss any questions you have with your health care provider.

## 2014-07-01 NOTE — ED Notes (Signed)
Pt in c/o cough and congestion for the last week, back pain, reports intermittent fever at home with body aches and chills, no distress noted, also dysuria

## 2014-07-01 NOTE — ED Notes (Signed)
The pt reports that he has been in the waiting room.  Did not hear me call

## 2015-04-16 ENCOUNTER — Encounter (HOSPITAL_COMMUNITY): Payer: Self-pay | Admitting: Vascular Surgery

## 2015-04-16 ENCOUNTER — Emergency Department (HOSPITAL_COMMUNITY)
Admission: EM | Admit: 2015-04-16 | Discharge: 2015-04-16 | Disposition: A | Discharge status: Home / Self Care | Payer: Medicaid Other | Attending: Emergency Medicine | Admitting: Emergency Medicine

## 2015-04-16 DIAGNOSIS — I10 Essential (primary) hypertension: Secondary | ICD-10-CM | POA: Insufficient documentation

## 2015-04-16 DIAGNOSIS — Z7952 Long term (current) use of systemic steroids: Secondary | ICD-10-CM | POA: Insufficient documentation

## 2015-04-16 DIAGNOSIS — M25571 Pain in right ankle and joints of right foot: Secondary | ICD-10-CM

## 2015-04-16 DIAGNOSIS — Z791 Long term (current) use of non-steroidal anti-inflammatories (NSAID): Secondary | ICD-10-CM | POA: Insufficient documentation

## 2015-04-16 DIAGNOSIS — Z720 Tobacco use: Secondary | ICD-10-CM | POA: Insufficient documentation

## 2015-04-16 DIAGNOSIS — Z792 Long term (current) use of antibiotics: Secondary | ICD-10-CM | POA: Insufficient documentation

## 2015-04-16 DIAGNOSIS — Z79899 Other long term (current) drug therapy: Secondary | ICD-10-CM | POA: Insufficient documentation

## 2015-04-16 DIAGNOSIS — Z87828 Personal history of other (healed) physical injury and trauma: Secondary | ICD-10-CM | POA: Insufficient documentation

## 2015-04-16 MED ORDER — IBUPROFEN 800 MG PO TABS: 800.0000 mg | ORAL_TABLET | Freq: Three times a day (TID) | ORAL | Status: DC | PRN

## 2015-04-16 NOTE — ED Notes (Addendum)
Pt reports to the ED for eval of right ankle pain since Friday. Reports he broke it on 9/19 and had a boot placed on it on Friday and has been working on it since Friday and reports he has been having increased pain. Pt was given some medication for pain but it is no longer working. Denies any numbness, tingling, or paralysis to the extremity. Pt A&Ox4, resp e/u, and skin warm and dry.

## 2015-04-16 NOTE — ED Notes (Signed)
Declined W/C at D/C and was escorted to lobby by RN. 
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
## 2015-04-16 NOTE — Discharge Instructions (Signed)

## 2015-06-01 ENCOUNTER — Telehealth: Payer: Self-pay | Admitting: Internal Medicine

## 2015-06-01 NOTE — Telephone Encounter (Signed)
Patient came in requesting a medication refill for HTN, amlodipine. Please follow up with patient.

## 2015-06-02 ENCOUNTER — Telehealth: Payer: Self-pay

## 2015-06-02 NOTE — Telephone Encounter (Signed)
Returned patient phone call [phone] states do not leave a message Home number not accepting incoming calls at this time Unable to leave message

## 2015-06-08 ENCOUNTER — Ambulatory Visit: Payer: Medicaid Other | Attending: Family Medicine | Admitting: Pharmacist

## 2015-06-08 ENCOUNTER — Encounter: Payer: Self-pay | Admitting: Pharmacist

## 2015-06-08 VITALS — BP 156/92 | HR 80 | Wt 238.0 lb

## 2015-06-08 DIAGNOSIS — I10 Essential (primary) hypertension: Secondary | ICD-10-CM | POA: Insufficient documentation

## 2015-06-08 LAB — BASIC METABOLIC PANEL
BUN: 9 mg/dL (ref 7–25)
CHLORIDE: 103 mmol/L (ref 98–110)
CO2: 28 mmol/L (ref 20–31)
Calcium: 9.2 mg/dL (ref 8.6–10.3)
Creat: 0.99 mg/dL (ref 0.70–1.33)
Glucose, Bld: 96 mg/dL (ref 65–99)
POTASSIUM: 4.4 mmol/L (ref 3.5–5.3)
Sodium: 139 mmol/L (ref 135–146)

## 2015-06-08 MED ORDER — AMLODIPINE BESYLATE 10 MG PO TABS: 10.0000 mg | ORAL_TABLET | Freq: Every day | ORAL | Status: DC

## 2015-06-08 MED ORDER — LISINOPRIL-HYDROCHLOROTHIAZIDE 20-12.5 MG PO TABS: 1.0000 | ORAL_TABLET | Freq: Every day | ORAL | Status: DC

## 2015-06-08 NOTE — Patient Instructions (Signed)
Pick up your blood pressure medications and start taking them  Schedule an appointment with a new primary care provider as Dr. Orpah CobbAdvani is no longer here  If you cannot get into see a new provider in the next 1-2 weeks - come back and see me

## 2015-06-08 NOTE — Progress Notes (Signed)
S:    Patient arrives in good spirits with his wife.    Presents to the clinic for hypertension evaluation.   Patient denies adherence with medications. He has been out of all of his medications for a while  Current BP Medications include:  None (out)  Antihypertensives tried in the past include: lisinopril-HCTZ and amlodipine     O:   Last 3 Office BP readings: BP Readings from Last 3 Encounters:  06/08/15 156/92  04/16/15 156/109  07/01/14 141/73    BMET    Component Value Date/Time   NA 134* 07/01/2014 1803   K 3.5 07/01/2014 1803   CL 100 07/01/2014 1803   CO2 26 07/01/2014 1803   GLUCOSE 109* 07/01/2014 1803   BUN 18 07/01/2014 1803   CREATININE 1.27 07/01/2014 1803   CREATININE 0.90 05/14/2013 1432   CALCIUM 9.3 07/01/2014 1803   GFRNONAA 65* 07/01/2014 1803   GFRNONAA >89 05/14/2013 1432   GFRAA 75* 07/01/2014 1803   GFRAA >89 05/14/2013 1432    A/P: History of hypertension currently UNcontrolled on no medications as patient is noncompliant. Will restart lisinopril-HCTZ 20-12.5 mg daily and amlodipine 10 mg daily. Ordered a BMET to be obtained today. Patient to schedule an appointment with new primary care provider as Dr. Orpah CobbAdvani is no longer here. Counseled patient on the importance of routine follow up and not running out of his medications. Patient verbalized understanding.  Medication reconciliation completed. Results reviewed and written information provided.   Total time in face-to-face counseling 20 minutes.  F/U Clinic Visit with new primary care provider.

## 2015-06-12 NOTE — Telephone Encounter (Signed)
Patient verified DOB Patient made aware of his lab results being normal. Patient expressed his understanding and had no further questions. Patient requested a vitamin d refill. Medical Assistant informed patient of last Vitamin D level being checked in 2014. Dr. Orpah CobbAdvani discontinued patients Vitamin D because he completed the amount he wished for him to take. Patient expressed his understanding and had no further questions.

## 2015-06-12 NOTE — Telephone Encounter (Signed)
-----   Message from Quentin Angstlugbemiga E Jegede, MD sent at 06/09/2015 12:44 PM EST ----- Please inform patient that his lab result is normal.

## 2015-06-14 NOTE — Telephone Encounter (Signed)
error 

## 2015-06-22 ENCOUNTER — Inpatient Hospital Stay (HOSPITAL_COMMUNITY)
Admission: EM | Admit: 2015-06-22 | Discharge: 2015-06-24 | DRG: 310 | Disposition: A | Discharge status: Home / Self Care | Payer: Self-pay | Attending: Cardiovascular Disease | Admitting: Cardiovascular Disease

## 2015-06-22 ENCOUNTER — Emergency Department (HOSPITAL_COMMUNITY): Payer: Self-pay

## 2015-06-22 ENCOUNTER — Encounter (HOSPITAL_COMMUNITY): Payer: Self-pay | Admitting: Emergency Medicine

## 2015-06-22 DIAGNOSIS — Z720 Tobacco use: Secondary | ICD-10-CM | POA: Diagnosis present

## 2015-06-22 DIAGNOSIS — Z87891 Personal history of nicotine dependence: Secondary | ICD-10-CM | POA: Diagnosis present

## 2015-06-22 DIAGNOSIS — I4891 Unspecified atrial fibrillation: Secondary | ICD-10-CM

## 2015-06-22 DIAGNOSIS — N179 Acute kidney failure, unspecified: Secondary | ICD-10-CM

## 2015-06-22 DIAGNOSIS — R7303 Prediabetes: Secondary | ICD-10-CM | POA: Diagnosis present

## 2015-06-22 DIAGNOSIS — I4892 Unspecified atrial flutter: Secondary | ICD-10-CM | POA: Diagnosis present

## 2015-06-22 DIAGNOSIS — I959 Hypotension, unspecified: Secondary | ICD-10-CM | POA: Diagnosis present

## 2015-06-22 DIAGNOSIS — I480 Paroxysmal atrial fibrillation: Secondary | ICD-10-CM | POA: Diagnosis present

## 2015-06-22 DIAGNOSIS — E860 Dehydration: Secondary | ICD-10-CM | POA: Diagnosis present

## 2015-06-22 DIAGNOSIS — Z79899 Other long term (current) drug therapy: Secondary | ICD-10-CM

## 2015-06-22 DIAGNOSIS — R0789 Other chest pain: Secondary | ICD-10-CM | POA: Diagnosis present

## 2015-06-22 DIAGNOSIS — R079 Chest pain, unspecified: Secondary | ICD-10-CM

## 2015-06-22 DIAGNOSIS — F17210 Nicotine dependence, cigarettes, uncomplicated: Secondary | ICD-10-CM | POA: Diagnosis present

## 2015-06-22 DIAGNOSIS — I4819 Other persistent atrial fibrillation: Secondary | ICD-10-CM | POA: Diagnosis present

## 2015-06-22 DIAGNOSIS — I10 Essential (primary) hypertension: Secondary | ICD-10-CM | POA: Insufficient documentation

## 2015-06-22 DIAGNOSIS — I481 Persistent atrial fibrillation: Principal | ICD-10-CM | POA: Diagnosis present

## 2015-06-22 HISTORY — DX: Tobacco use: Z72.0

## 2015-06-22 HISTORY — DX: Prediabetes: R73.03

## 2015-06-22 HISTORY — DX: Other persistent atrial fibrillation: I48.19

## 2015-06-22 LAB — BASIC METABOLIC PANEL
Anion gap: 12 (ref 5–15)
BUN: 18 mg/dL (ref 6–20)
CHLORIDE: 101 mmol/L (ref 101–111)
CO2: 29 mmol/L (ref 22–32)
Calcium: 9.5 mg/dL (ref 8.9–10.3)
Creatinine, Ser: 1.6 mg/dL — ABNORMAL HIGH (ref 0.61–1.24)
GFR, EST AFRICAN AMERICAN: 56 mL/min — AB (ref >60)
GFR, EST NON AFRICAN AMERICAN: 49 mL/min — AB (ref >60)
Glucose, Bld: 93 mg/dL (ref 65–99)
POTASSIUM: 3.9 mmol/L (ref 3.5–5.1)
SODIUM: 142 mmol/L (ref 135–145)

## 2015-06-22 LAB — HEPARIN LEVEL (UNFRACTIONATED): Heparin Unfractionated: 0.65 IU/mL (ref 0.30–0.70)

## 2015-06-22 LAB — CBC
HEMATOCRIT: 46.8 % (ref 39.0–52.0)
Hemoglobin: 15.6 g/dL (ref 13.0–17.0)
MCH: 29.3 pg (ref 26.0–34.0)
MCHC: 33.3 g/dL (ref 30.0–36.0)
MCV: 87.8 fL (ref 78.0–100.0)
PLATELETS: 271 10*3/uL (ref 150–400)
RBC: 5.33 MIL/uL (ref 4.22–5.81)
RDW: 14.2 % (ref 11.5–15.5)
WBC: 7.1 10*3/uL (ref 4.0–10.5)

## 2015-06-22 LAB — PROTIME-INR
INR: 1.09 (ref 0.00–1.49)
Prothrombin Time: 14.3 seconds (ref 11.6–15.2)

## 2015-06-22 LAB — TSH: TSH: 2.678 u[IU]/mL (ref 0.350–4.500)

## 2015-06-22 LAB — I-STAT TROPONIN, ED: Troponin i, poc: 0.05 ng/mL (ref 0.00–0.08)

## 2015-06-22 MED ORDER — AMLODIPINE BESYLATE 5 MG PO TABS: 5.0000 mg | ORAL_TABLET | Freq: Every day | ORAL | Status: DC

## 2015-06-22 MED ORDER — WARFARIN SODIUM 10 MG PO TABS
10.0000 mg | ORAL_TABLET | Freq: Once | ORAL | Status: AC
  Administered 2015-06-22: 10 mg via ORAL
  Filled 2015-06-22 (×2): qty 1

## 2015-06-22 MED ORDER — HEPARIN (PORCINE) IN NACL 100-0.45 UNIT/ML-% IJ SOLN
1500.0000 [IU]/h | INTRAMUSCULAR | Status: DC
  Administered 2015-06-22 – 2015-06-23 (×2): 1500 [IU]/h via INTRAVENOUS
  Filled 2015-06-22 (×2): qty 250

## 2015-06-22 MED ORDER — REGADENOSON 0.4 MG/5ML IV SOLN
0.4000 mg | Freq: Once | INTRAVENOUS | Status: AC
  Administered 2015-06-23: 0.4 mg via INTRAVENOUS
  Filled 2015-06-22 (×2): qty 5

## 2015-06-22 MED ORDER — SODIUM CHLORIDE 0.9 % IV BOLUS (SEPSIS)
500.0000 mL | Freq: Once | INTRAVENOUS | Status: AC
  Administered 2015-06-22: 500 mL via INTRAVENOUS

## 2015-06-22 MED ORDER — SODIUM CHLORIDE 0.9 % IV SOLN
Freq: Once | INTRAVENOUS | Status: AC
  Administered 2015-06-22: 16:00:00 via INTRAVENOUS

## 2015-06-22 MED ORDER — HEPARIN BOLUS VIA INFUSION
4000.0000 [IU] | Freq: Once | INTRAVENOUS | Status: AC
  Administered 2015-06-22: 4000 [IU] via INTRAVENOUS
  Filled 2015-06-22: qty 4000

## 2015-06-22 MED ORDER — WARFARIN - PHARMACIST DOSING INPATIENT: Freq: Every day | Status: DC

## 2015-06-22 MED ORDER — ONDANSETRON HCL 4 MG/2ML IJ SOLN: 4.0000 mg | Freq: Four times a day (QID) | INTRAMUSCULAR | Status: DC | PRN

## 2015-06-22 MED ORDER — CARVEDILOL 6.25 MG PO TABS
6.2500 mg | ORAL_TABLET | Freq: Two times a day (BID) | ORAL | Status: DC
  Administered 2015-06-22: 6.25 mg via ORAL
  Filled 2015-06-22: qty 1

## 2015-06-22 MED ORDER — WARFARIN VIDEO
Freq: Once | Status: AC
  Administered 2015-06-23: 11:00:00

## 2015-06-22 MED ORDER — COUMADIN BOOK
Freq: Once | Status: DC
  Filled 2015-06-22: qty 1

## 2015-06-22 MED ORDER — SODIUM CHLORIDE 0.9 % IV SOLN: INTRAVENOUS | Status: AC

## 2015-06-22 MED ORDER — ACETAMINOPHEN 325 MG PO TABS
650.0000 mg | ORAL_TABLET | ORAL | Status: DC | PRN
  Administered 2015-06-22: 650 mg via ORAL
  Filled 2015-06-22: qty 2

## 2015-06-22 NOTE — ED Notes (Signed)
Pt here from work. Pt reports that he was at work and began felling lightheaded and dizzy with right sided chest/shoulder pain. EMS reports afib 100-150, no history. Pt received 1 nitro and 324 ASA PTA. No pain at this time. Dizziness has also resolved.

## 2015-06-22 NOTE — Consult Note (Signed)
ANTICOAGULATION CONSULT NOTE - Follow-up Consult  Pharmacy Consult for Heparin Indication: new onset atrial fibrillation  No Known Allergies  Patient Measurements: Height: 5\' 10"  (177.8 cm) Weight: 233 lb 11.2 oz (106.006 kg) IBW/kg (Calculated) : 73 Heparin Dosing Weight: 96kg  Vital Signs: Temp: 97.9 F (36.6 C) (12/29 2023) Temp Source: Oral (12/29 2023) BP: 135/85 mmHg (12/29 2023) Pulse Rate: 68 (12/29 2023)  Labs:  Recent Labs  06/22/15 1226 06/22/15 2203  HGB 15.6  --   HCT 46.8  --   PLT 271  --   HEPARINUNFRC  --  0.65  CREATININE 1.60*  --     Estimated Creatinine Clearance: 67.3 mL/min (by C-G formula based on Cr of 1.6).   Assessment: 49yom presents to the ED with lightheadedness, dizziness, and right sided chest pain. He was found to be in afib RVR (new onset). CHADSVASC = 1. He will begin coumadin with IV heparin bridge. Heparin level therapeutic (0.65) on 1500 units/hr. No bleeding noted.  Goal of Therapy:  INR 2-3 Heparin level 0.3-0.7 units/ml Monitor platelets by anticoagulation protocol: Yes   Plan:  Continue heparin drip at 1500 units/hr F/u daily INR, heparin level, and CBC  Christoper Fabianaron Alvia Tory, PharmD, BCPS Clinical pharmacist, pager [phone] 06/22/2015,11:09 PM

## 2015-06-22 NOTE — ED Provider Notes (Signed)
CSN: [id number]     Arrival date & time 06/22/15  1135 History   First MD Initiated Contact with Patient 06/22/15 1202     Chief Complaint  Patient presents with  . Atrial Fibrillation     (Consider location/radiation/quality/duration/timing/severity/associated sxs/prior Treatment) HPI.....Marland Kitchen level V caveat for urgent need for intervention. Patient was at work a brief time ago when he felt lightheaded and dizzy with some right-sided chest pain radiating to the shoulder. EMS reported A. fib with a rate of 100-150.  This has never happened before. Nitroglycerin 1 and aspirin 324 mg given. Past medical history includes hypertension and smoking. Severity of symptoms is moderate.  Past Medical History  Diagnosis Date  . Hypertension    History reviewed. No pertinent past surgical history. No family history on file. Social History  Substance Use Topics  . Smoking status: Current Every Day Smoker -- 0.20 packs/day    Types: Cigarettes  . Smokeless tobacco: None  . Alcohol Use: No    Review of Systems  Reason unable to perform ROS: urgent need for intervention.      Allergies  Review of patient's allergies indicates no known allergies.  Home Medications   Prior to Admission medications   Medication Sig Start Date End Date Taking? Authorizing Provider  albuterol (PROVENTIL HFA;VENTOLIN HFA) 108 (90 BASE) MCG/ACT inhaler Inhale 1-2 puffs into the lungs every 6 (six) hours as needed for wheezing. 07/01/14  Yes Rolland Porter, MD  amLODipine (NORVASC) 10 MG tablet Take 1 tablet (10 mg total) by mouth daily. 06/08/15  Yes Quentin Angst, MD  lisinopril-hydrochlorothiazide (PRINZIDE,ZESTORETIC) 20-12.5 MG tablet Take 1 tablet by mouth daily. 06/08/15  Yes Olugbemiga Annitta Needs, MD   BP 110/82 mmHg  Pulse 109  Temp(Src) 97.7 F (36.5 C) (Oral)  Resp 18  Ht  (1.778 m)  Wt 236 lb (107.049 kg)  BMI 33.86 kg/m2  SpO2 97% Physical Exam  Constitutional: He is oriented to person,  place, and time. He appears well-developed and well-nourished.  HENT:  Head: Normocephalic and atraumatic.  Eyes: Conjunctivae and EOM are normal. Pupils are equal, round, and reactive to light.  Neck: Normal range of motion. Neck supple.  Cardiovascular:  Tachycardic, irregularly irregular.  Pulmonary/Chest: Effort normal and breath sounds normal.  Abdominal: Soft. Bowel sounds are normal.  Musculoskeletal: Normal range of motion.  Neurological: He is alert and oriented to person, place, and time.  Skin: Skin is warm and dry.  Psychiatric: He has a normal mood and affect. His behavior is normal.  Nursing note and vitals reviewed.   ED Course  Procedures (including critical care time) Labs Review Labs Reviewed  BASIC METABOLIC PANEL - Abnormal; Notable for the following:    Creatinine, Ser 1.60 (*)    GFR calc non Af Amer 49 (*)    GFR calc Af Amer 56 (*)    All other components within normal limits  CBC  I-STAT TROPOININ, ED    Imaging Review Dg Chest 2 View  06/22/2015  CLINICAL DATA:  Chest pain EXAM: CHEST  2 VIEW COMPARISON:  07/01/2014 chest radiograph. FINDINGS: Stable cardiomediastinal silhouette with normal heart size. No pneumothorax. No pleural effusion. Clear lungs, with no focal lung consolidation and no pulmonary edema. IMPRESSION: No active cardiopulmonary disease. Electronically Signed   By: Delbert Phenix M.D.   On: 06/22/2015 13:15   I have personally reviewed and evaluated these images and lab results as part of my medical decision-making.   EKG Interpretation  Date/Time:  Thursday June 22 2015 12:46:19 EST Ventricular Rate:  108 PR Interval:    QRS Duration: 79 QT Interval:  325 QTC Calculation: 436 R Axis:   99 Text Interpretation:  Atrial fibrillation Lateral infarct, old  Anteroseptal infarct, old Confirmed by Bodie Abernethy  MD, Jayma Volpi ([id number]) on  06/22/2015 1:50:09 PM      MDM   Final diagnoses:  Atrial fibrillation, unspecified type [facility](HCC)     Patient is hemodynamically stable. Initial electrocardiogram showed atrial flutter with a 2-1 block, rate 146. EKG #2 shows atrial fibrillation, rate 108.  No further medication given at this time. Will consult cardiology.    Donnetta HutchingBrian Yuleidy Rappleye, MD 06/22/15 [phone]

## 2015-06-22 NOTE — H&P (Signed)
ADMISSION HISTORY AND PHYSICAL   Date: 06/22/2015               Patient Name:  Brian Moody A Bezio MRN: [id number]  DOB: 03/11/1966 Age / Sex: 49 y.o., male        PCP: Doris CheadleADVANI, DEEPAK Primary Cardiologist: New/Nahser          History of Present Illness: Patient is a 49 y.o. male with a PMHx of HTN, who [facility] for evaluation of dyspnea and sweats  HTN for years,  Has been taking BP meds for 3 years. Had been out of his meds for several months. [facility] who restarted the Lisinopril HCTZ 20-12.5 and amlodipine Creatinine was normal on Dec. 15. Is elevated today  Has felt poorly since restarting the meds . Has been very thirsty   Pt had similar problems yesterday evening , Was standing in the kitchen , felt light headed, had sweat. Had some right sided chest pain.  No pleuetic CP. Has DOE , off and on for the past several months , Worse for the past 2 weeks  Goes to his medical doctor every 2 months.  To check BP .   Still eats lots of salty food. Smokes ~1 ppd Needs to find a new primary MD  Works in a warehouse,      Medications: Outpatient medications:  (Not in a hospital admission)  No Known Allergies   Past Medical History  Diagnosis Date  . Hypertension     History reviewed. No pertinent past surgical history.  Family History  Problem Relation Age of Onset  . Hypertension Father   . Diabetes Maternal Grandmother   . Diabetes Maternal Grandfather   . Hypertension Maternal Grandmother   . Hypertension Maternal Grandfather     Social History:  reports that he has been smoking Cigarettes.  He has been smoking about 0.20 packs per day. He does not have any smokeless tobacco history on file. He reports that he does not drink alcohol or use illicit drugs.   Review of Systems: Constitutional:  denies fever, chills, diaphoresis, appetite change and fatigue.  HEENT: denies photophobia, eye  pain, redness, hearing loss, ear pain, congestion, sore throat, rhinorrhea, sneezing, neck pain, neck stiffness and tinnitus.  Respiratory: denies SOB, DOE, cough, chest tightness, and wheezing.  Cardiovascular: admits to chest pain, palpitations and mild  leg swelling.  Gastrointestinal: denies nausea, vomiting, abdominal pain, diarrhea, constipation, blood in stool.  Genitourinary: denies dysuria, urgency, frequency, hematuria, flank pain and difficulty urinating.  Musculoskeletal: admits to  myalgias, back pain, joint swelling, arthralgias and gait problem.   Skin: denies pallor, rash and wound.  Neurological: admits to dizziness, seizures, syncope, weakness, light-headedness,    Hematological: denies adenopathy, easy bruising, personal or family bleeding history.  Psychiatric/ Behavioral: denies suicidal ideation, mood changes, confusion, nervousness, sleep disturbance and agitation.    Physical Exam: BP 110/82 mmHg  Pulse 109  Temp(Src) 97.7 F (36.5 C) (Oral)  Resp 18  Ht 5\' 10"  (1.778 m)  Wt 236 lb (107.049 kg)  BMI 33.86 kg/m2  SpO2 97%  Wt Readings from Last 3 Encounters:  06/22/15 236 lb (107.049 kg)  06/08/15 238 lb (107.956 kg)  04/12/14 236 lb 6.4 oz (107.23 kg)    General: Vital signs reviewed and noted. Well-developed, well-nourished, in no acute distress; alert,   Head: Normocephalic, atraumatic, sclera anicteric, mucus membranes are moist   Neck: Supple.  Negative for carotid bruits. JVD not elevated.   Lungs:  Clear bilaterally to auscultation without wheezes, rales, or rhonchi. Breathing is normal   Heart: Irreg. Irreg. , no murmurs  Abdomen:  Soft, non-tender, non-distended with normoactive bowel sounds. No hepatomegaly. No rebound/guarding. No obvious abdominal masses   MSK: Strength and the appear normal for age.   Extremities: No clubbing or cyanosis. No edema.  Distal pedal pulses are 2+ and equal bilaterally .  Neurologic: Alert and oriented X 3. Moves all  extremities spontaneously   Psych:  normal     Lab results: Basic Metabolic Panel:  Recent Labs Lab 06/22/15 1226  NA 142  K 3.9  CL 101  CO2 29  GLUCOSE 93  BUN 18  CREATININE 1.60*  CALCIUM 9.5    Liver Function Tests: No results for input(s): AST, ALT, ALKPHOS, BILITOT, PROT, ALBUMIN in the last 168 hours. No results for input(s): LIPASE, AMYLASE in the last 168 hours.  CBC:  Recent Labs Lab 06/22/15 1226  WBC 7.1  HGB 15.6  HCT 46.8  MCV 87.8  PLT 271    Cardiac Enzymes: No results for input(s): CKTOTAL, CKMB, CKMBINDEX, TROPONINI in the last 168 hours.  BNP: Invalid input(s): POCBNP  CBG: No results for input(s): GLUCAP in the last 168 hours.  Coagulation Studies: No results for input(s): LABPROT, INR in the last 72 hours.   Other results:  EKG:   Atrial fib.   With RVR  On tele, rate is normal    Imaging: Dg Chest 2 View  06/22/2015  CLINICAL DATA:  Chest pain EXAM: CHEST  2 VIEW COMPARISON:  07/01/2014 chest radiograph. FINDINGS: Stable cardiomediastinal silhouette with normal heart size. No pneumothorax. No pleural effusion. Clear lungs, with no focal lung consolidation and no pulmonary edema. IMPRESSION: No active cardiopulmonary disease. Electronically Signed   By: Delbert Phenix M.D.   On: 06/22/2015 13:15      Assessment & Plan:  1. Atrial fib: newly diagnosed atrial fib.   May be related to his volume depletion  CHADS2VASC = 1.   Will start him in coumadin so that we can consider cardioversion if he does not convert. Discussed DOACs.  He does not think he can pay for them so we will start coumadin .   2. Essential HTN - he was restarted on his BP meds ( Lisinopril HCTZ 20-12.5 a day about 2 weeks ago by the nurse at the [facility] and since that time has not felt well - frequently thirsty, weak, light headed. Here is is hypotenisve and has atrial fib. I suspect he is volume depleted. Will rehydrate. decrease amlodipine to 5 mg  a day.   Will start coreg 6.25 BID  Needs to watch his salt intake .   3.  Chest pain :  Somewhat atypical but he is a smoker.    Will et a [employer]  4. Acute renal insufficiency :   Likely to to ACE-I and dehydration from his HCTZ. Will DC and try Coreg.   He may tolerate an ACE-I without the HCTZ but will avoid for now  DVT PPX - IV heparin , coumadin     Vesta Mixer, Montez Hageman., MD, [facility] 06/22/2015, 2:34 PM

## 2015-06-22 NOTE — Consult Note (Signed)
ANTICOAGULATION CONSULT NOTE - Initial Consult  Pharmacy Consult for Heparin and Coumadin Indication: new onset atrial fibrillation  No Known Allergies  Patient Measurements: Height: 5\' 10"  (177.8 cm) Weight: 236 lb (107.049 kg) IBW/kg (Calculated) : 73 Heparin Dosing Weight: 96kg  Vital Signs: Temp: 97.7 F (36.5 C) (12/29 1143) Temp Source: Oral (12/29 1143) BP: 110/82 mmHg (12/29 1222) Pulse Rate: 109 (12/29 1222)  Labs:  Recent Labs  06/22/15 1226  HGB 15.6  HCT 46.8  PLT 271  CREATININE 1.60*    Estimated Creatinine Clearance: 67.7 mL/min (by C-G formula based on Cr of 1.6).   Medical History: Past Medical History  Diagnosis Date  . Hypertension     Medications:  No anticoagulants pta  Assessment: 49yom presents to the ED with lightheadedness, dizziness, and right sided chest pain. He was found to be in afib RVR (new onset). CHADSVASC = 1. He will begin coumadin with IV heparin bridge. He has renal insufficiency with sCr 1.6, other labs wnl. Baseline INR pending.  Goal of Therapy:  INR 2-3 Heparin level 0.3-0.7 units/ml Monitor platelets by anticoagulation protocol: Yes   Plan:  1) Heparin bolus 4000 units x 1 2) Heparin drip at 1500 units/hr 3) Check 6 hour heparin level 4) Coumadin 10mg  x 1 5) Coumadin education - book/video 6) Daily INR, heparin level, CBC  Brian Moody, Brian Moody 06/22/2015,3:33 PM

## 2015-06-23 ENCOUNTER — Inpatient Hospital Stay (HOSPITAL_COMMUNITY): Payer: Self-pay

## 2015-06-23 ENCOUNTER — Inpatient Hospital Stay (HOSPITAL_COMMUNITY): Payer: Medicaid Other

## 2015-06-23 DIAGNOSIS — R079 Chest pain, unspecified: Secondary | ICD-10-CM

## 2015-06-23 DIAGNOSIS — I4891 Unspecified atrial fibrillation: Secondary | ICD-10-CM

## 2015-06-23 DIAGNOSIS — I480 Paroxysmal atrial fibrillation: Secondary | ICD-10-CM

## 2015-06-23 LAB — BASIC METABOLIC PANEL
ANION GAP: 8 (ref 5–15)
BUN: 13 mg/dL (ref 6–20)
CHLORIDE: 105 mmol/L (ref 101–111)
CO2: 27 mmol/L (ref 22–32)
Calcium: 8.5 mg/dL — ABNORMAL LOW (ref 8.9–10.3)
Creatinine, Ser: 0.99 mg/dL (ref 0.61–1.24)
GFR calc non Af Amer: >60 mL/min (ref >60)
GLUCOSE: 121 mg/dL — AB (ref 65–99)
Potassium: 3.5 mmol/L (ref 3.5–5.1)
Sodium: 140 mmol/L (ref 135–145)

## 2015-06-23 LAB — CBC
HCT: 44.9 % (ref 39.0–52.0)
HEMOGLOBIN: 15 g/dL (ref 13.0–17.0)
MCH: 29.4 pg (ref 26.0–34.0)
MCHC: 33.4 g/dL (ref 30.0–36.0)
MCV: 88 fL (ref 78.0–100.0)
Platelets: 254 10*3/uL (ref 150–400)
RBC: 5.1 MIL/uL (ref 4.22–5.81)
RDW: 14.3 % (ref 11.5–15.5)
WBC: 9.1 10*3/uL (ref 4.0–10.5)

## 2015-06-23 LAB — NM MYOCAR MULTI W/SPECT W/WALL MOTION / EF
CHL CUP RESTING HR STRESS: 109 {beats}/min
CSEPED: 0 min
Estimated workload: 1 METS
Exercise duration (sec): 0 s
MPHR: 170 {beats}/min
Peak HR: 142 {beats}/min
Percent HR: 83 %

## 2015-06-23 LAB — TROPONIN I: Troponin I: <0.03 ng/mL (ref <0.031)

## 2015-06-23 LAB — PROTIME-INR
INR: 1.02 (ref 0.00–1.49)
PROTHROMBIN TIME: 13.6 s (ref 11.6–15.2)

## 2015-06-23 LAB — HEPARIN LEVEL (UNFRACTIONATED): HEPARIN UNFRACTIONATED: 0.4 [IU]/mL (ref 0.30–0.70)

## 2015-06-23 LAB — LIPID PANEL
CHOL/HDL RATIO: 5.1 ratio
Cholesterol: 194 mg/dL (ref 0–200)
HDL: 38 mg/dL — AB (ref >40)
LDL CALC: 135 mg/dL — AB (ref 0–99)
TRIGLYCERIDES: 106 mg/dL (ref <150)
VLDL: 21 mg/dL (ref 0–40)

## 2015-06-23 LAB — HEMOGLOBIN A1C
HEMOGLOBIN A1C: 6.3 % — AB (ref 4.8–5.6)
Mean Plasma Glucose: 134 mg/dL

## 2015-06-23 MED ORDER — TECHNETIUM TC 99M SESTAMIBI GENERIC - CARDIOLITE
10.0000 | Freq: Once | INTRAVENOUS | Status: AC | PRN
  Administered 2015-06-23: 10 via INTRAVENOUS

## 2015-06-23 MED ORDER — WARFARIN SODIUM 10 MG PO TABS: 10.0000 mg | ORAL_TABLET | Freq: Once | ORAL | Status: DC

## 2015-06-23 MED ORDER — AMLODIPINE BESYLATE 2.5 MG PO TABS
2.5000 mg | ORAL_TABLET | Freq: Every day | ORAL | Status: DC
  Administered 2015-06-24: 2.5 mg via ORAL
  Filled 2015-06-23: qty 1

## 2015-06-23 MED ORDER — RIVAROXABAN 20 MG PO TABS
20.0000 mg | ORAL_TABLET | Freq: Every day | ORAL | Status: DC
  Administered 2015-06-23 – 2015-06-24 (×2): 20 mg via ORAL
  Filled 2015-06-23 (×3): qty 1

## 2015-06-23 MED ORDER — POTASSIUM CHLORIDE CRYS ER 10 MEQ PO TBCR
10.0000 meq | EXTENDED_RELEASE_TABLET | Freq: Every day | ORAL | Status: DC
  Administered 2015-06-24: 10 meq via ORAL
  Filled 2015-06-23: qty 1

## 2015-06-23 MED ORDER — CARVEDILOL 12.5 MG PO TABS
12.5000 mg | ORAL_TABLET | Freq: Two times a day (BID) | ORAL | Status: DC
  Administered 2015-06-23 – 2015-06-24 (×3): 12.5 mg via ORAL
  Filled 2015-06-23 (×3): qty 1

## 2015-06-23 MED ORDER — REGADENOSON 0.4 MG/5ML IV SOLN
INTRAVENOUS | Status: AC
Start: 2015-06-23 — End: 2015-06-23
  Filled 2015-06-23: qty 5

## 2015-06-23 MED ORDER — TECHNETIUM TC 99M SESTAMIBI GENERIC - CARDIOLITE
30.0000 | Freq: Once | INTRAVENOUS | Status: AC | PRN
  Administered 2015-06-23: 30 via INTRAVENOUS

## 2015-06-23 MED ORDER — POTASSIUM CHLORIDE CRYS ER 20 MEQ PO TBCR
40.0000 meq | EXTENDED_RELEASE_TABLET | Freq: Once | ORAL | Status: AC
  Administered 2015-06-23: 40 meq via ORAL
  Filled 2015-06-23: qty 2

## 2015-06-23 NOTE — Discharge Instructions (Signed)

## 2015-06-23 NOTE — Progress Notes (Signed)
Utilization review completed. Ariyonna Twichell, RN, BSN. 

## 2015-06-23 NOTE — Progress Notes (Addendum)
Patient Name: Brian Moody Date of Encounter: 06/23/2015  Active Problems:   PAF (paroxysmal atrial fibrillation) (HCC)   Atrial fibrillation [facility])   Primary Cardiologist: Dr. Elease Hashimoto Patient Profile: 49 yo male w/ PMH of HTN who presented to [facility] ED on 06/22/2015 for new-onset dyspnea for the past few months found to be in atrial fibrillation w/ RVR.  SUBJECTIVE: Denies any repeat chest pain or shortness of breath overnight. Seen in nuclear medicine for NST.  OBJECTIVE Filed Vitals:   06/22/15 1713 06/22/15 2023 06/23/15 0521 06/23/15 0841  BP: 119/85 135/85 112/70 102/87  Pulse: 134 68 115   Temp:  97.9 F (36.6 C) 97.8 F (36.6 C)   TempSrc:  Oral Oral   Resp:  16 18   Height:  (1.778 m)     Weight: 233 lb 11.2 oz (106.006 kg)  232 lb (105.235 kg)   SpO2: 99% 100% 96%     Intake/Output Summary (Last 24 hours) at 06/23/15 0847 Last data filed at 06/23/15 0720  Gross per 24 hour  Intake    894 ml  Output   1425 ml  Net   -531 ml   Filed Weights   06/22/15 1143 06/22/15 1713 06/23/15 0521  Weight: 236 lb (107.049 kg) 233 lb 11.2 oz (106.006 kg) 232 lb (105.235 kg)    PHYSICAL EXAM General: Well developed, well nourished, male in no acute distress. Head: Normocephalic, atraumatic.  Neck: Supple without bruits, JVD not elevated. Lungs:  Resp regular and unlabored, CTA without wheezing or rales. Heart: Irregularly irregular, S1, S2, no S3, S4, or murmur; no rub. Abdomen: Soft, non-tender, non-distended with normoactive bowel sounds. No hepatomegaly. No rebound/guarding. No obvious abdominal masses. Extremities: No clubbing, cyanosis, or edema. Distal pedal pulses are 2+ bilaterally. Neuro: Alert and oriented X 3. Moves all extremities spontaneously. Psych: Normal affect.   LABS: CBC: Recent Labs  06/22/15 1226 06/23/15 0406  WBC 7.1 9.1  HGB 15.6 15.0  HCT 46.8 44.9  MCV 87.8 88.0  PLT 271 254   INR: Recent Labs  06/23/15 0406   INR 1.02   Basic Metabolic Panel: Recent Labs  06/22/15 1226 06/23/15 0406  NA 142 140  K 3.9 3.5  CL 101 105  CO2 29 27  GLUCOSE 93 121*  BUN 18 13  CREATININE 1.60* 0.99  CALCIUM 9.5 8.5*    Recent Labs  06/22/15 1227  TROPIPOC 0.05   BNP: No results found for: BNP PRO B NATRIURETIC PEPTIDE (BNP)  Date/Time Value Ref Range Status  10/21/2011 11:53 AM 20.0 0 - 125 pg/mL Final   Hemoglobin A1C: Recent Labs  06/22/15 1813  HGBA1C 6.3*   Fasting Lipid Panel: Recent Labs  06/23/15 0406  CHOL 194  HDL 38*  LDLCALC 135*  TRIG 106  CHOLHDL 5.1   Thyroid Function Tests: Recent Labs  06/22/15 1512  TSH 2.678    TELE:  Not reviewed. Seen in Nuc Med.      ECG: Atrial Fibrillation with rate in 110's.  ECHO: Pending  Radiology/Studies: Dg Chest 2 View: 06/22/2015  CLINICAL DATA:  Chest pain EXAM: CHEST  2 VIEW COMPARISON:  07/01/2014 chest radiograph. FINDINGS: Stable cardiomediastinal silhouette with normal heart size. No pneumothorax. No pleural effusion. Clear lungs, with no focal lung consolidation and no pulmonary edema. IMPRESSION: No active cardiopulmonary disease. Electronically Signed   By: Delbert Phenix M.D.   On: 06/22/2015 13:15     Current Medications:  . amLODipine  5 mg Oral Daily  . carvedilol  6.25 mg Oral BID WC  . coumadin book   Does not apply Once  . regadenoson      . regadenoson  0.4 mg Intravenous Once  . warfarin   Does not apply Once  . Warfarin - Pharmacist Dosing Inpatient   Does not apply q1800   . heparin 1,500 Units/hr (06/23/15 0351)    ASSESSMENT AND PLAN:  1. Newly diagnosed atrial fib.  - May be related to his volume depletion  - CHADS2VASC = 1.Has been stated on Coumadin for anticoagulation per pharmacy dosing so we can consider cardioversion if he does not convert. Patient believes financial limitations would inhibit NOAC.  2. Essential HTN  - he was restarted on his BP meds (Lisinopril HCTZ 20-12.5 a day  about 2 weeks ago [facility] and since that time has not felt well - frequently thirsty, weak, light headed). - Amlodipine decreased to 5mg  daily and  Started on Coreg 6.25 BID  - BP has been 102/65 - 159/102 in the past 24 hours. - Needs to watch his salt intake .   3.Atypical Chest pain  - somewhat atypical but he is a smoker.  - Lexiscan performed. Results pending.  4. Acute renal insufficiency - likely due to ACE-I and dehydration from his HCTZ. - Creatinine 1.60 on admission. Improved to 0.99 on 06/23/2015.   Lorri FrederickSigned, Brittany M Strader , PA-C 8:47 AM 06/23/2015 Pager: [phone]   Attending Note:   The patient was seen and examined.  Agree with assessment and plan as noted above.  Changes made to the above note as needed.  Pt is feeling better.  1. Hypertension:   Was actually hypotensive on admission due to volume depletion.  Better after IVF and with holding his lisinopril HCT. Continue with Coreg, ( increase as needed / tolerated )  We may need to consider changing to metoprolol if we need additional rate control without lowering BP too much His BP remains on the low side,   We may not need the amlodipine  Will reduce the dose further to 2.5 mg a day .   2. Atrial fib:   Getting loaded on coumdin . Rate control with coreg.   in IV heparin . We discussed Lovenox - he may be able to go home on Lovonox. Have discussed with pharmacy about transitioning to Lovenox.   Echo shows normal LV function    3. Acute renal insufficiency :   Has resolved with IV NS and by holding the Lisinopril HCT.    I suspect most of it was caused by the dehydration so its possible that he could try ACE-I again if neede.     Vesta MixerPhilip J. Alexander Mcauley, Montez HagemanJr., MD, [facility] 06/23/2015, 10:08 AM [address],  [address] Office [phone] Pager [phone]- [phone]   Addendum: Pt has no insurance.  Case manager has provided a voucher for free 30 days of Xarelto and  a patient assistance form in the shadow chart. He can probably go home tomorrow if he does well with the medication adjustment ( increased coreg, decreased amlodipine)     Lowen Mansouri, Deloris PingPhilip J, MD  06/23/2015 4:01 PM    [facility] [address],  [address] [place], [place]  [address] Pager [phone] Phone: [phone]; Fax: [phone]

## 2015-06-23 NOTE — Care Management Note (Addendum)
Case Management Note  Patient Details  Name: Brian Moody MRN: [id number] Date of Birth: 10/21/1965  Subjective/Objective:  Pt admitted for A fib. Pt will plan to be d/c on Xarelto. Pt is without insurance.                   Action/Plan: [facility] Pharmacy closed at noon 06-23-15. Pt will need to go to local pharmacy for medication once d/c. CM will provide pt with 30 day free card. Pt will need Rx for 30 day free. Pt assistance form on shadow chart and will need to be provided to pt before d/c. Once pt has f/u at the clinic- the clinic pharmacy can help assist pt with patient assist form as well. No further needs from CM at this time.    Expected Discharge Date:                  Expected Discharge Plan:  Home/Self Care  In-House Referral:  NA  Discharge planning Services  CM Consult, Medication Assistance  Post Acute Care Choice:  NA Choice offered to:  NA  DME Arranged:  N/A DME Agency:  NA  HH Arranged:  NA HH Agency:  NA  Status of Service:  Completed, signed off  Medicare Important Message Given:    Date Medicare IM Given:    Medicare IM give by:    Date Additional Medicare IM Given:    Additional Medicare Important Message give by:     If discussed at Long Length of Stay Meetings, dates discussed:    Additional Comments: 1442 06-23-15 Tomi BambergerBrenda Graves-Bigelow, RN,BSN [phone] CM did call [facility] on [address] and Xarelto is available. No further needs from CM at this time.   Gala LewandowskyGraves-Bigelow, Chereese Cilento Kaye, RN 06/23/2015, 1:48 PM

## 2015-06-23 NOTE — Progress Notes (Signed)
ANTICOAGULATION CONSULT NOTE - Follow Up Consult  Pharmacy Consult for Heparin/Coumadin Indication: atrial fibrillation  No Known Allergies  Patient Measurements: Height: 5\' 10"  (177.8 cm) Weight: 232 lb (105.235 kg) IBW/kg (Calculated) : 73 Heparin Dosing Weight:  96 kg  Vital Signs: Temp: 97.8 F (36.6 C) (12/30 0521) Temp Source: Oral (12/30 0521) BP: 102/87 mmHg (12/30 0841) Pulse Rate: 115 (12/30 0521)  Labs:  Recent Labs  06/22/15 1226 06/22/15 2203 06/23/15 0406  HGB 15.6  --  15.0  HCT 46.8  --  44.9  PLT 271  --  254  LABPROT  --  14.3 13.6  INR  --  1.09 1.02  HEPARINUNFRC  --  0.65 0.40  CREATININE 1.60*  --  0.99    Estimated Creatinine Clearance: 108.5 mL/min (by C-G formula based on Cr of 0.99).  Assessment: 49yom presents to the ED with lightheadedness, dizziness, and right sided chest pain. He was found to be in afib RVR (new onset). CHADSVASC = 1. He will begin coumadin with IV heparin bridge.  Anticoagulation: Heparin/Coumadin for new afib, Baseline INR 1.09. Heparin level 0.4 and INR 1.02 this AM. CBC WNL.  Goal of Therapy:  Heparin level 0.3-0.7 units/ml  INR 2-3 Monitor platelets by anticoagulation protocol: Yes   Plan:  Continue IV heparin at 1500 units/hr Repeat Coumadin 10mg  po x 1 tonight. Daily HL, CBC, and INR   Clytie Shetley S. Merilynn Finlandobertson, PharmD, [facility] Clinical Staff Pharmacist Pager [phone]  Misty Stanleyobertson, Tylor Gambrill Stillinger 06/23/2015,8:46 AM

## 2015-06-23 NOTE — Progress Notes (Signed)
  Echocardiogram 2D Echocardiogram has been performed.  Brian Moody, [name] 06/23/2015, 12:23 PM

## 2015-06-24 ENCOUNTER — Telehealth: Payer: Self-pay | Admitting: Internal Medicine

## 2015-06-24 ENCOUNTER — Encounter (HOSPITAL_COMMUNITY): Payer: Self-pay | Admitting: Physician Assistant

## 2015-06-24 ENCOUNTER — Other Ambulatory Visit: Payer: Self-pay | Admitting: Physician Assistant

## 2015-06-24 DIAGNOSIS — I10 Essential (primary) hypertension: Secondary | ICD-10-CM | POA: Insufficient documentation

## 2015-06-24 DIAGNOSIS — Z720 Tobacco use: Secondary | ICD-10-CM | POA: Diagnosis present

## 2015-06-24 DIAGNOSIS — Z87891 Personal history of nicotine dependence: Secondary | ICD-10-CM | POA: Diagnosis present

## 2015-06-24 DIAGNOSIS — R7303 Prediabetes: Secondary | ICD-10-CM | POA: Diagnosis present

## 2015-06-24 DIAGNOSIS — I4819 Other persistent atrial fibrillation: Secondary | ICD-10-CM | POA: Diagnosis present

## 2015-06-24 MED ORDER — AMLODIPINE BESYLATE 2.5 MG PO TABS: 10.0000 mg | ORAL_TABLET | Freq: Every day | ORAL | Status: DC

## 2015-06-24 MED ORDER — CARVEDILOL 12.5 MG PO TABS: 12.5000 mg | ORAL_TABLET | Freq: Two times a day (BID) | ORAL | Status: DC

## 2015-06-24 MED ORDER — RIVAROXABAN 20 MG PO TABS: 20.0000 mg | ORAL_TABLET | Freq: Every day | ORAL | Status: DC

## 2015-06-24 NOTE — Progress Notes (Addendum)
SUBJECTIVE:  The patient is doing well today.  At this time, he denies chest pain, shortness of breath, or any new concerns.  Wants to go home  . amLODipine  2.5 mg Oral Daily  . carvedilol  12.5 mg Oral BID WC  . coumadin book   Does not apply Once  . potassium chloride  10 mEq Oral Daily  . rivaroxaban  20 mg Oral Q supper      OBJECTIVE: Physical Exam: Filed Vitals:   06/23/15 1759 06/23/15 1808 06/23/15 2048 06/24/15 0515  BP: 122/79 128/70 103/75 128/84  Pulse: 60 80 101 101  Temp: 98 F (36.7 C) 97.4 F (36.3 C) 97.7 F (36.5 C) 98 F (36.7 C)  TempSrc: Oral Oral Oral Oral  Resp: 18 18 18 20   Height:      Weight:    231 lb (104.781 kg)  SpO2: 100% 100% 97% 100%    Intake/Output Summary (Last 24 hours) at 06/24/15 1214 Last data filed at 06/24/15 0900  Gross per 24 hour  Intake    600 ml  Output      0 ml  Net    600 ml    Telemetry reveals afib, V rates 80s  GEN- The patient is well appearing, alert and oriented x 3 today.   Head- normocephalic, atraumatic Eyes-  Sclera clear, conjunctiva pink Ears- hearing intact Oropharynx- clear Neck- supple,  Lungs- Clear to ausculation bilaterally, normal work of breathing Heart- irregular rate and rhythm, no murmurs, rubs or gallops, PMI not laterally displaced GI- soft, NT, ND, + BS Extremities- no clubbing, cyanosis, or edema Skin- no rash or lesion Psych- euthymic mood, full affect Neuro- strength and sensation are intact  LABS: Basic Metabolic Panel:  Recent Labs  16/03/9611/29/16 1226 06/23/15 0406  NA 142 140  K 3.9 3.5  CL 101 105  CO2 29 27  GLUCOSE 93 121*  BUN 18 13  CREATININE 1.60* 0.99  CALCIUM 9.5 8.5*   Liver Function Tests: No results for input(s): AST, ALT, ALKPHOS, BILITOT, PROT, ALBUMIN in the last 72 hours. No results for input(s): LIPASE, AMYLASE in the last 72 hours. CBC:  Recent Labs  06/22/15 1226 06/23/15 0406  WBC 7.1 9.1  HGB 15.6 15.0  HCT 46.8 44.9  MCV 87.8 88.0    PLT 271 254   Cardiac Enzymes:  Recent Labs  06/23/15 1141  TROPONINI <0.03   BNP: Invalid input(s): POCBNP D-Dimer: No results for input(s): DDIMER in the last 72 hours. Hemoglobin A1C:  Recent Labs  06/22/15 1813  HGBA1C 6.3*   Fasting Lipid Panel:  Recent Labs  06/23/15 0406  CHOL 194  HDL 38*  LDLCALC 135*  TRIG 106  CHOLHDL 5.1   Thyroid Function Tests:  Recent Labs  06/22/15 1512  TSH 2.678   Anemia Panel: No results for input(s): VITAMINB12, FOLATE, FERRITIN, TIBC, IRON, RETICCTPCT in the last 72 hours.  RADIOLOGY: Dg Chest 2 View  06/22/2015  CLINICAL DATA:  Chest pain EXAM: CHEST  2 VIEW COMPARISON:  07/01/2014 chest radiograph. FINDINGS: Stable cardiomediastinal silhouette with normal heart size. No pneumothorax. No pleural effusion. Clear lungs, with no focal lung consolidation and no pulmonary edema. IMPRESSION: No active cardiopulmonary disease. Electronically Signed   By: Delbert PhenixJason A Poff M.D.   On: 06/22/2015 13:15   Nm Myocar Multi W/spect W/wall Motion / Ef  06/23/2015  CLINICAL DATA:  Chest pain EXAM: MYOCARDIAL IMAGING WITH SPECT (REST AND PHARMACOLOGIC-STRESS) GATED LEFT VENTRICULAR WALL MOTION STUDY  LEFT VENTRICULAR EJECTION FRACTION TECHNIQUE: Standard myocardial SPECT imaging was performed after resting intravenous injection of 10 mCi Tc-8m sestamibi. Subsequently, intravenous infusion of Lexiscan was performed under the supervision of the Cardiology staff. At peak effect of the drug, 30 mCi Tc-79m sestamibi was injected intravenously and standard myocardial SPECT imaging was performed. Quantitative gated imaging was also performed to evaluate left ventricular wall motion, and estimate left ventricular ejection fraction. COMPARISON:  None. FINDINGS: Perfusion: Allowing for diaphragmatic attenuation artifact, there are no perfusion defects. Wall Motion: Moderate global hypokinesis. Left Ventricular Ejection Fraction: 41 % End diastolic volume 50 ml  End systolic volume 34 ml IMPRESSION: 1. No reversible ischemia or infarction. 2. Moderate global hypokinesis. 3. Left ventricular ejection fraction 41% 4. Intermediate-risk stress test findings*. *2012 Appropriate Use Criteria for Coronary Revascularization Focused Update: J Am Coll Cardiol. 2012;59(9):857-881. http://content.dementiazones.com.aspx?articleid=1201161 Electronically Signed   By: Jolaine Click M.D.   On: 06/23/2015 13:00    ASSESSMENT AND PLAN:  Active Problems:   PAF (paroxysmal atrial fibrillation) (HCC)   Atrial fibrillation (HCC)  1. Persistent atrial fibrillation Rate controlled Has been given a 30 day voucher for xarelto.  Not sure what our long term anticoagulation options are.  I think that if we could get him through a couple months (cardioversion), then we could stop anticoagulation as his chads2vasc score is 1.  I have sent staff message to AF clinic to arrange for follow-up there next week for further outpatient AF management  2. HTN Stable No change required today  Discharge to home today  Hillis Range, MD 06/24/2015 12:14 PM

## 2015-06-24 NOTE — Telephone Encounter (Signed)
[name] called because he had not received a prescription for xarelto 20 mg once daily for atrial fibrillation. However, he received the voucher for 30 days. I reviewed the chart and he needs xarelto 20 mg once daily for atrial fibrillation anticoagulation. He requested I called a [facility] and the one [address], # [phone], was open until 9:00 pm this evening per the phone call. I called in 30 day prescription without any refills in hopes of getting him through of xarelto 20 mg tablet. I called him back and let him know. I left a message at the pharmacy because the direct to pharmacy line was not answered.   Brian [name] Mone, MD

## 2015-06-24 NOTE — Discharge Summary (Signed)
Discharge Summary   Patient ID: Brian Moody MRN: [id number], DOB/AGE: [birth year] 50 y.o. Admit date: 06/22/2015 D/C date:     06/24/2015  Primary Cardiologist: Dr. Elease Hashimoto  Principal Problem:   Persistent atrial fibrillation [facility]) Active Problems:   Essential hypertension   Tobacco abuse   Pre-diabetes    Admission Dates: 06/22/15-06/24/15 Discharge Diagnosis: new onset afib with RVR   HPI: Brian Moody is a 49 y.o. male with a history of tobacco abuse and HTN who presented to [facility] on 06/22/15 with for new-onset dyspnea for the past few months found to be in atrial fibrillation w/ RVR.   He has a long history of HTN and has been taking BP meds for 3 years. He had been out of his meds for several months. He was recently restarted on his BP meds (Lisinopril HCTZ 20-12.5 a day about 2 weeks ago by the nurse at the [facility] and since that time he did not feel well ( frequently thirsty, weak, light headed). He had some lightheadedness, diaphoresis and right sided chest pain and presented to the [facility] ED where he was found to be in afib with RVR and AKI felt to be due to dehydration.   Hospital Course  Newly diagnosed atrial fib- persistent.  - May be related to his volume depletion  - 2D ECHO with normal LVEF and mildly dilated LA  - CHADS2VASC = 1.He was initially started on Coumadin for anticoagulation due to concern for cost of DOAC but then later changed to Xarelto ( Case manager has provided a voucher for free 30 days of Xarelto and a patient assistance form). Long term I am not sure if he will be able to afford this. This can be re-evaluated as an outpatient once he finds out how much his co-pay will be. Per Dr. Johney Frame: " i think that if we could get him through a couple months (cardioversion), then we could stop anticoagulation as his chads2vasc score is 1." - Dr. Johney Frame has sent staff message to AF clinic to arrange for follow-up there next week for further outpatient  AF management.  Essential HTN  - He was actually hypotensive on admission due to volume depletion. This improved after IVF and with holding his lisinopril HCT. - Amlodipine decreased to  daily andstarted on Coreg 6.25 BID-> increased to 12.5mg  BID. Continue with Coreg ( increase as needed / tolerated )  - BP has been well controlled over the past 24 hours. I will continue to hold Lisinopril-HCT - Needs to watch his salt intake .   Atypical Chest pain  - somewhat atypical but he is a smoker.  - Lexiscan performed 06/23/15 which returned low risk for ischemia EF 41%, however, normal LVEF by 2D ECHO (60-65%).  Acute renal insufficiency - likely due to ACE-I and dehydration from his HCTZ. - Creatinine 1.60 on admission. Improved to normal with IVFs and holding ACE  Pre-diabetes - HgA1c 6.3. Recommended diet and exercise and close follow up with PCP.  Tobacco abuse - Complete cessation encouraged.  The patient has had an uncomplicated hospital course and is recovering well. He has been seen by Dr. Johney Frame today and deemed ready for discharge home. A staff message to arrange follow-up appointments has been sent. Smoking cessation was disscussed in length. Discharge medications are listed below.   Discharge Vitals: Blood pressure 128/86, pulse 95, temperature 97.8 F (36.6 C), temperature source Oral, resp. rate 18, height  (1.778 m), weight 231 lb (  104.781 kg), SpO2 98 %.  Labs: Lab Results  Component Value Date   WBC 9.1 06/23/2015   HGB 15.0 06/23/2015   HCT 44.9 06/23/2015   MCV 88.0 06/23/2015   PLT 254 06/23/2015     Recent Labs Lab 06/23/15 0406  NA 140  K 3.5  CL 105  CO2 27  BUN 13  CREATININE 0.99  CALCIUM 8.5*  GLUCOSE 121*    Recent Labs  06/23/15 1141  TROPONINI <0.03   Lab Results  Component Value Date   CHOL 194 06/23/2015   HDL 38* 06/23/2015   LDLCALC 135* 06/23/2015   TRIG 106 06/23/2015   No results found for:  DDIMER  Diagnostic Studies/Procedures   Dg Chest 2 View  06/22/2015  CLINICAL DATA:  Chest pain EXAM: CHEST  2 VIEW COMPARISON:  07/01/2014 chest radiograph. FINDINGS: Stable cardiomediastinal silhouette with normal heart size. No pneumothorax. No pleural effusion. Clear lungs, with no focal lung consolidation and no pulmonary edema. IMPRESSION: No active cardiopulmonary disease. Electronically Signed   By: Delbert Phenix M.D.   On: 06/22/2015 13:15   Nm Myocar Multi W/spect W/wall Motion / Ef  06/23/2015  CLINICAL DATA:  Chest pain EXAM: MYOCARDIAL IMAGING WITH SPECT (REST AND PHARMACOLOGIC-STRESS) GATED LEFT VENTRICULAR WALL MOTION STUDY LEFT VENTRICULAR EJECTION FRACTION TECHNIQUE: Standard myocardial SPECT imaging was performed after resting intravenous injection of 10 mCi Tc-75m sestamibi. Subsequently, intravenous infusion of Lexiscan was performed under the supervision of the Cardiology staff. At peak effect of the drug, 30 mCi Tc-39m sestamibi was injected intravenously and standard myocardial SPECT imaging was performed. Quantitative gated imaging was also performed to evaluate left ventricular wall motion, and estimate left ventricular ejection fraction. COMPARISON:  None. FINDINGS: Perfusion: Allowing for diaphragmatic attenuation artifact, there are no perfusion defects. Wall Motion: Moderate global hypokinesis. Left Ventricular Ejection Fraction: 41 % End diastolic volume 50 ml End systolic volume 34 ml IMPRESSION: 1. No reversible ischemia or infarction. 2. Moderate global hypokinesis. 3. Left ventricular ejection fraction 41% 4. Intermediate-risk stress test findings*. *2012 Appropriate Use Criteria for Coronary Revascularization Focused Update: J Am Coll Cardiol. 2012;59(9):857-881. http://content.dementiazones.com.aspx?articleid=1201161 Electronically Signed   By: Jolaine Click M.D.   On: 06/23/2015 13:00    2D ECHO:  06/23/2015 ------------------------------------------------------------------- LV EF: 60% -  65% ------------------------------------------------------------------- Study Conclusions - Left ventricle: The cavity size was normal. Wall thickness was increased in a pattern of moderate LVH. Systolic function was normal. The estimated ejection fraction was in the range of 60% to 65%. Wall motion was normal; there were no regional wall motion abnormalities. - Left atrium: The atrium was moderately dilated. - Atrial septum: No defect or patent foramen ovale was identified.  Discharge Medications     Medication List    STOP taking these medications        lisinopril-hydrochlorothiazide 20-12.5 MG tablet  Commonly known as:  PRINZIDE,ZESTORETIC      TAKE these medications        albuterol 108 (90 Base) MCG/ACT inhaler  Commonly known as:  PROVENTIL HFA;VENTOLIN HFA  Inhale 1-2 puffs into the lungs every 6 (six) hours as needed for wheezing.     amLODipine 2.5 MG tablet  Commonly known as:  NORVASC  Take 4 tablets (10 mg total) by mouth daily.     carvedilol 12.5 MG tablet  Commonly known as:  COREG  Take 1 tablet (12.5 mg total) by mouth 2 (two) times daily with a meal.     rivaroxaban 20 MG Tabs tablet  Commonly known as:  XARELTO  Take 1 tablet (20 mg total) by mouth daily with supper.        Disposition   The patient will be discharged in stable condition to home.  Follow-up Information    Follow up with CARROLL,DONNA, NP.   Specialties:  Nurse Practitioner, Cardiology   Why:  The office will call you to make an appoinment., If you do not hear from them, please contact them., You should be seen within 1 week.   Contact information:   [address] [place] [address] [phone]         Duration of Discharge Encounter: Greater than 30 minutes including physician and PA time.  SignedCline Crock, THOMPSON, KATHRYN R PA-C 06/24/2015, 3:09 PM   Jarold SongJames  Drea Jurewicz,MD

## 2015-06-26 MED FILL — ?CARVEDILOL 12.5 MG TABLET: 12.5 | 30 days supply | Qty: 60 | Fill #0

## 2015-06-27 ENCOUNTER — Encounter (HOSPITAL_COMMUNITY): Payer: Self-pay | Admitting: Emergency Medicine

## 2015-06-27 ENCOUNTER — Emergency Department (HOSPITAL_COMMUNITY)
Admission: EM | Admit: 2015-06-27 | Discharge: 2015-06-27 | Disposition: A | Discharge status: Home / Self Care | Payer: Medicaid Other | Attending: Physician Assistant | Admitting: Physician Assistant

## 2015-06-27 DIAGNOSIS — H02846 Edema of left eye, unspecified eyelid: Secondary | ICD-10-CM | POA: Insufficient documentation

## 2015-06-27 DIAGNOSIS — Z79899 Other long term (current) drug therapy: Secondary | ICD-10-CM | POA: Insufficient documentation

## 2015-06-27 DIAGNOSIS — F17210 Nicotine dependence, cigarettes, uncomplicated: Secondary | ICD-10-CM | POA: Insufficient documentation

## 2015-06-27 DIAGNOSIS — I10 Essential (primary) hypertension: Secondary | ICD-10-CM | POA: Insufficient documentation

## 2015-06-27 MED ORDER — DIPHENHYDRAMINE HCL 25 MG PO TABS: 25.0000 mg | ORAL_TABLET | Freq: Four times a day (QID) | ORAL | Status: DC

## 2015-06-27 NOTE — ED Provider Notes (Signed)
CSN: [id number]     Arrival date & time 06/27/15  1101 History  By signing my name below, I, Brian Moody, attest that this documentation has been prepared under the direction and in the presence of [employer], PA-C. Electronically Signed: Placido Moody, ED Scribe. 06/27/2015. 1:54 PM.   Chief Complaint  Patient presents with  . Facial Swelling   The history is provided by the patient. No language interpreter was used.   HPI Comments: Brian Moody is a 50 y.o. male who presents to the Emergency Department complaining of constant, mild, left upper eyelid and left sided facial swelling with onset 2 days ago. Pt was seen on 12/29 and dx with atrial fibrillation and began taking Xarelto since being discharged on 12/31 noting that he began experiencing his current symptoms upon waking on 1/1 after taking the 1st dose of his medications the night before. He notes having a dose of coumadin this morning during a follow up appointment at [facility] who recommended that he come to the ED for evaluation of his facial swelling. He additionally notes having filled an rx for carvedilol which he not began as of yet. Pt notes some mild, associated, eye itchiness. He confirms his hx of pre-diabetes further noting that his blood glucose levels have been nml. Pt notes he has another follow up at [facility] on 1/10. He denies CP, SOB, trouble swallowing, visual disturbance, abd pain, throat tingling or other associated symptoms at this time. Patient reports symptoms were the worst this morning, but have rapidly improved during his stay in the emergency room waiting room.    Past Medical History  Diagnosis Date  . Hypertension   . Persistent atrial fibrillation (HCC)     a. newly diagnosed on 05/2015 admission. started on coumadin.   . Tobacco abuse   . Pre-diabetes     a. HgA1c 6.3 05/2015   History reviewed. No pertinent past surgical history. Family History  Problem  Relation Age of Onset  . Hypertension Father   . Diabetes Maternal Grandmother   . Diabetes Maternal Grandfather   . Hypertension Maternal Grandmother   . Hypertension Maternal Grandfather    Social History  Substance Use Topics  . Smoking status: Current Every Day Smoker -- 0.20 packs/day    Types: Cigarettes  . Smokeless tobacco: None  . Alcohol Use: No    Review of Systems  HENT: Positive for facial swelling. Negative for trouble swallowing.   Eyes: Positive for pain and itching. Negative for visual disturbance.  Respiratory: Negative for shortness of breath.   Cardiovascular: Negative for chest pain.  Gastrointestinal: Negative for abdominal pain.    Allergies  Review of patient's allergies indicates no known allergies.  Home Medications   Prior to Admission medications   Medication Sig Start Date End Date Taking? Authorizing Provider  albuterol (PROVENTIL HFA;VENTOLIN HFA) 108 (90 BASE) MCG/ACT inhaler Inhale 1-2 puffs into the lungs every 6 (six) hours as needed for wheezing. 07/01/14   Rolland Porter, MD  amLODipine (NORVASC) 2.5 MG tablet Take 4 tablets (10 mg total) by mouth daily. 06/24/15   Janetta Hora, PA-C  carvedilol (COREG) 12.5 MG tablet Take 1 tablet (12.5 mg total) by mouth 2 (two) times daily with a meal. 06/24/15   Janetta Hora, PA-C  diphenhydrAMINE (BENADRYL) 25 MG tablet Take 1 tablet (25 mg total) by mouth every 6 (six) hours. 06/27/15   Eyvonne Mechanic, PA-C  rivaroxaban (XARELTO) 20 MG TABS  tablet Take 1 tablet (20 mg total) by mouth daily with supper. 06/24/15   Janetta HoraKathryn R Thompson, PA-C   BP 122/102 mmHg  Pulse 72  Temp(Src) 98 F (36.7 C) (Oral)  Resp 18  Ht 5\' 10"  (1.778 m)  Wt 108.546 kg  BMI 34.34 kg/m2  SpO2 100%    Physical Exam  Constitutional: He is oriented to person, place, and time. He appears well-developed and well-nourished.  HENT:  Head: Normocephalic and atraumatic.  Mouth/Throat: No oropharyngeal exudate.  Eyes:   Superior minor lid edema localized to the surface closest to the eye; no surrounding edema to the surrounding soft tissues lower lid; EOMs intact and pain free; vision intact; no conjunctiva or redness  Neck: Normal range of motion. No tracheal deviation present.  Cardiovascular: Normal rate and intact distal pulses.  A regularly irregular rhythm present.  Pulmonary/Chest: Effort normal. No respiratory distress.  Abdominal: Soft. There is no tenderness.  Musculoskeletal: Normal range of motion.  Neurological: He is alert and oriented to person, place, and time.  Skin: Skin is warm and dry. He is not diaphoretic.  Psychiatric: He has a normal mood and affect. His behavior is normal.  Nursing note and vitals reviewed.   ED Course  Procedures  DIAGNOSTIC STUDIES: Oxygen Saturation is 98% on RA, normal by my interpretation.    COORDINATION OF CARE: 1:52 PM Pt presents today due to left upper eyelid swelling and left sided facial swelling. Discussed next steps with pt including return precautions and he agreed to the plan.   Labs Review Labs Reviewed - No data to display  Imaging Review No results found.    EKG Interpretation None      MDM   Final diagnoses:  Swelling of eyelid, left    Labs:  Imaging:  Consults:  Therapeutics:  Discharge Meds:   Assessment/Plan: Patient presentation likely allergic in nature. He has very minor swelling to the upper or lower eyelid he has no signs of angioedema to the tongue throat lips or face other than the eyelid. He denies any respiratory complaints, symptoms have significantly improved from this morning. Only new medication patient has started taking his Xarelto, due to his A. fib I feel it's necessary for him to continue taking this medication, I will add Benadryl into his medications, he is instructed to follow-up with his primary care provider as soon as possible, contact his cardiologist and inform him of today's reaction.  Patient denies any chest pain, palpitations, shortness of breath, dizziness, or any other concerning signs or symptoms of his A. fib consultations. Patient's given strict return precautions, verbalized understanding and agreement for today's plan and had no further questions or concerns at time of discharge      I personally performed the services described in this documentation, which was scribed in my presence. The recorded information has been reviewed and is accurate.    Eyvonne MechanicJeffrey Dejanae Helser, PA-C 06/27/15 1441  Courteney Randall AnLyn Mackuen, MD 06/27/15 1658

## 2015-06-27 NOTE — Discharge Instructions (Signed)
Please read attached information. If you experience any new or worsening signs or symptoms please return to the emergency room for evaluation. Please follow-up with your primary care provider or specialist as discussed. Please use medication prescribed only as directed and discontinue taking if you have any concerning signs or symptoms.   °

## 2015-06-27 NOTE — ED Notes (Signed)
Pt sts taking new meds and now having swelling to left side of face and eye x 1 week; pt sts thinks from medicine; pt denies change in vision at present; swelling noted to upper left lid

## 2015-07-04 ENCOUNTER — Inpatient Hospital Stay: Payer: Medicaid Other | Admitting: Family Medicine

## 2015-07-05 ENCOUNTER — Ambulatory Visit (HOSPITAL_COMMUNITY)
Admission: RE | Admit: 2015-07-05 | Discharge: 2015-07-05 | Disposition: A | Discharge status: Home / Self Care | Payer: Self-pay | Source: Ambulatory Visit | Attending: Nurse Practitioner | Admitting: Nurse Practitioner

## 2015-07-05 VITALS — BP 114/86 | HR 99 | Ht 70.0 in | Wt 242.2 lb

## 2015-07-05 DIAGNOSIS — I481 Persistent atrial fibrillation: Secondary | ICD-10-CM | POA: Insufficient documentation

## 2015-07-05 DIAGNOSIS — I4819 Other persistent atrial fibrillation: Secondary | ICD-10-CM

## 2015-07-05 DIAGNOSIS — I10 Essential (primary) hypertension: Secondary | ICD-10-CM | POA: Insufficient documentation

## 2015-07-05 LAB — BASIC METABOLIC PANEL
ANION GAP: 8 (ref 5–15)
BUN: 11 mg/dL (ref 6–20)
CO2: 24 mmol/L (ref 22–32)
Calcium: 8.8 mg/dL — ABNORMAL LOW (ref 8.9–10.3)
Chloride: 107 mmol/L (ref 101–111)
Creatinine, Ser: 0.97 mg/dL (ref 0.61–1.24)
GFR calc Af Amer: >60 mL/min (ref >60)
GLUCOSE: 84 mg/dL (ref 65–99)
POTASSIUM: 3.9 mmol/L (ref 3.5–5.1)
Sodium: 139 mmol/L (ref 135–145)

## 2015-07-05 LAB — CBC
HEMATOCRIT: 44.1 % (ref 39.0–52.0)
Hemoglobin: 14.8 g/dL (ref 13.0–17.0)
MCH: 29.4 pg (ref 26.0–34.0)
MCHC: 33.6 g/dL (ref 30.0–36.0)
MCV: 87.5 fL (ref 78.0–100.0)
PLATELETS: 250 10*3/uL (ref 150–400)
RBC: 5.04 MIL/uL (ref 4.22–5.81)
RDW: 14.3 % (ref 11.5–15.5)
WBC: 8.8 10*3/uL (ref 4.0–10.5)

## 2015-07-05 NOTE — Patient Instructions (Signed)
Cardioversion scheduled for Friday, January 20th  - Arrive at the [facility]tr[facility] and go to admitting at 12PM  -Do not eat or drink anything after midnight the night prior to your procedure.  - Take all your medication with a sip of water prior to arrival.  - You will not be able to drive home after your procedure.   Scheduler will be in touch with you regarding setting up sleep study.

## 2015-07-06 ENCOUNTER — Encounter (HOSPITAL_COMMUNITY): Payer: Self-pay | Admitting: Nurse Practitioner

## 2015-07-06 NOTE — Progress Notes (Signed)
Patient ID: Brian Moody, male   DOB: 02/24/1966, 50 y.o.   MRN: [id number]     Primary Care Physician: Doris CheadleADVANI, DEEPAK, MD Referring Phys[facility]   Brian Moody is a 50 y.o. male with a h/o tobacco abuse and HTN who [facility] with for new-onset dyspnea for the past few months found to be in atrial fibrillation w/ RVR.  He has a long history of HTN and has been taking BP meds for 3 years. He had been out of his meds for several months. He was recently restarted on his BP meds (Lisinopril HCTZ 20-12.5 a day about 2 weeks ago by [facility] and since that time he did not feel well ( frequently thirsty, weak, light headed). He had some lightheadedness, diaphoresis and right sided chest pain at work and [facility] where he was found to be in afib with RVR and AKI felt to be due to dehydration.   He was started on coumadin but then switched to xarelto for chadsvasc score of 1. He was rate controlled with plans for outpatient cardioversion once adequately anticoagulated. He does continue to smoke. Drinks a lot of caffeine, no alcohol, no regular exercise, obese. Admits to snoring and waking up choking and gasping for air at times. Daytime somnolence. 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 He states that he has taken xarelto on a regular basis without missed does and importance of no missed doses emphasized. He is rate controlled today with controlled blood pressure.  He returned to the ER for facial swelling thought possibly related to xarelto, was told to take benadryl, stated on xarelto and swelling resolved.  Today, he denies symptoms of palpitations, chest pain, shortness of breath, orthopnea, PND, lower extremity edema, dizziness, presyncope, syncope, or neurologic sequela. The patient is tolerating medications without difficulties and is otherwise without  complaint today.   Past Medical History  Diagnosis Date  . Hypertension   . Persistent atrial fibrillation (HCC)     a. newly diagnosed on 05/2015 admission. started on coumadin.   . Tobacco abuse   . Pre-diabetes     a. HgA1c 6.3 05/2015   No past surgical history on file.  Current Outpatient Prescriptions  Medication Sig Dispense Refill  . albuterol (PROVENTIL HFA;VENTOLIN HFA) 108 (90 BASE) MCG/ACT inhaler Inhale 1-2 puffs into the lungs every 6 (six) hours as needed for wheezing. 1 Inhaler 0  . amLODipine (NORVASC) 2.5 MG tablet Take 4 tablets (10 mg total) by mouth daily. 30 tablet 6  . carvedilol (COREG) 12.5 MG tablet Take 1 tablet (12.5 mg total) by mouth 2 (two) times daily with a meal. 60 tablet 6  . diphenhydrAMINE (BENADRYL) 25 MG tablet Take 1 tablet (25 mg total) by mouth every 6 (six) hours. 20 tablet 0  . rivaroxaban (XARELTO) 20 MG TABS tablet Take 1 tablet (20 mg total) by mouth daily with supper. 30 tablet 11   No current facility-administered medications for this encounter.    No Known Allergies  Social History   Social History  . Marital Status: Single    Spouse Name: N/A  . Number of Children: N/A  . Years of Education: N/A   Occupational History  . Not on file.   Social History Main Topics  . Smoking status: Current Every Day Smoker -- 0.20 packs/day    Types:  Cigarettes  . Smokeless tobacco: Not on file  . Alcohol Use: No  . Drug Use: No  . Sexual Activity: Not on file   Other Topics Concern  . Not on file   Social History Narrative    Family History  Problem Relation Age of Onset  . Hypertension Father   . Diabetes Maternal Grandmother   . Diabetes Maternal Grandfather   . Hypertension Maternal Grandmother   . Hypertension Maternal Grandfather     ROS- All systems are reviewed and negative except as per the HPI above  Physical Exam: Filed Vitals:   07/05/15 1331  BP: 114/86  Pulse: 99  Height: 5\' 10"  (1.778 m)  Weight: 242  lb 3.2 oz (109.861 kg)    GEN- The patient is well appearing, alert and oriented x 3 today.   Head- normocephalic, atraumatic Eyes-  Sclera clear, conjunctiva pink Ears- hearing intact Oropharynx- clear Neck- supple, no JVP Lymph- no cervical lymphadenopathy Lungs- Clear to ausculation bilaterally, normal work of breathing Heart- Irregular rate and rhythm, no murmurs, rubs or gallops, PMI not laterally displaced GI- soft, NT, ND, + BS Extremities- no clubbing, cyanosis, or edema MS- no significant deformity or atrophy Skin- no rash or lesion Psych- euthymic mood, full affect Neuro- strength and sensation are intact  EKG- Afib with v rate of 99 bpm, qrs int 80 ms, qt int 364 ms Epic records reviewed Myoview stress test 06/24/15-1. No reversible ischemia or infarction.  2. Moderate global hypokinesis.  3. Left ventricular ejection fraction 41%  4. Intermediate-risk stress test findings*.  Echo-Left ventricle: The cavity size was normal. Wall thickness was increased in a pattern of moderate LVH. Systolic function was normal. The estimated ejection fraction was in the range of 60% to 65%. Wall motion was normal; there were no regional wall motion abnormalities. - Left atrium: The atrium was moderately dilated. - Atrial septum: No defect or patent foramen ovale was identified  Assessment and Plan:  1.New onset persistent afib Will schedule for cardioversion after satisfying that pt has been on blood thinner x 3 weeks. He states no missed doses. bmet/cbc  2.Lifestyle issues Encouraged to lose weight and establish  regular exercise Encouraged tobacco cessation Decrease caffeine intake. Snoring, sleep study scheduled   3. HTN Controlled  F/u in afib clinic in one week s/p cardioversion  Lupita Leash C. Matthew Folks [facility] [address] [place], [place] [address] [phone]

## 2015-07-10 ENCOUNTER — Other Ambulatory Visit: Payer: Self-pay | Admitting: *Deleted

## 2015-07-10 DIAGNOSIS — I4891 Unspecified atrial fibrillation: Secondary | ICD-10-CM

## 2015-07-13 ENCOUNTER — Telehealth: Payer: Self-pay | Admitting: Cardiovascular Disease

## 2015-07-13 MED FILL — AMLODIPINE BESYLATE 10 MG T: 10 | 30 days supply | Qty: 30 | Fill #1

## 2015-07-13 NOTE — Telephone Encounter (Signed)
Patient called to cancel his cardioversion for tomorrow because his wife had surgery and he needs to be with her.  However, he states that since he was taken off the "high blood pressure medicine" he is no longer having symptoms - sob and heart racing and not sure he needs to reschedule.

## 2015-07-13 NOTE — Telephone Encounter (Signed)
Talked with patient -- states he is feeling much better over last few days - has his normal activity tolerance without shortness of breath and was even able to workout at the gym. States lower extremity swelling has improved he is able to put on shoes without them being tight like before.  Instructed pt to call back if further problems but will keep follow up appt for 1/26 to assess rhythm.

## 2015-07-14 ENCOUNTER — Encounter (HOSPITAL_COMMUNITY): Admission: RE | Payer: Self-pay | Source: Ambulatory Visit

## 2015-07-14 ENCOUNTER — Ambulatory Visit (HOSPITAL_COMMUNITY): Admission: RE | Admit: 2015-07-14 | Payer: MEDICAID | Source: Ambulatory Visit | Admitting: Cardiovascular Disease

## 2015-07-14 SURGERY — CARDIOVERSION
Anesthesia: Monitor Anesthesia Care

## 2015-07-20 ENCOUNTER — Ambulatory Visit: Payer: Self-pay | Attending: Family Medicine | Admitting: Family Medicine

## 2015-07-20 ENCOUNTER — Encounter: Payer: Self-pay | Admitting: Family Medicine

## 2015-07-20 ENCOUNTER — Ambulatory Visit (HOSPITAL_COMMUNITY)
Admission: RE | Admit: 2015-07-20 | Discharge: 2015-07-20 | Disposition: A | Discharge status: Home / Self Care | Payer: Self-pay | Source: Ambulatory Visit | Attending: Nurse Practitioner | Admitting: Nurse Practitioner

## 2015-07-20 ENCOUNTER — Encounter (HOSPITAL_COMMUNITY): Payer: Self-pay | Admitting: Nurse Practitioner

## 2015-07-20 VITALS — Resp 16 | Ht 70.0 in | Wt 243.0 lb

## 2015-07-20 VITALS — BP 122/88 | HR 107 | Ht 70.0 in | Wt 244.2 lb

## 2015-07-20 DIAGNOSIS — R0683 Snoring: Secondary | ICD-10-CM | POA: Insufficient documentation

## 2015-07-20 DIAGNOSIS — Z7901 Long term (current) use of anticoagulants: Secondary | ICD-10-CM | POA: Insufficient documentation

## 2015-07-20 DIAGNOSIS — F17210 Nicotine dependence, cigarettes, uncomplicated: Secondary | ICD-10-CM | POA: Insufficient documentation

## 2015-07-20 DIAGNOSIS — I4819 Other persistent atrial fibrillation: Secondary | ICD-10-CM

## 2015-07-20 DIAGNOSIS — I10 Essential (primary) hypertension: Secondary | ICD-10-CM | POA: Insufficient documentation

## 2015-07-20 DIAGNOSIS — I4891 Unspecified atrial fibrillation: Secondary | ICD-10-CM | POA: Insufficient documentation

## 2015-07-20 DIAGNOSIS — R21 Rash and other nonspecific skin eruption: Secondary | ICD-10-CM | POA: Insufficient documentation

## 2015-07-20 DIAGNOSIS — Z7902 Long term (current) use of antithrombotics/antiplatelets: Secondary | ICD-10-CM | POA: Insufficient documentation

## 2015-07-20 DIAGNOSIS — M79671 Pain in right foot: Secondary | ICD-10-CM

## 2015-07-20 DIAGNOSIS — Z8249 Family history of ischemic heart disease and other diseases of the circulatory system: Secondary | ICD-10-CM | POA: Insufficient documentation

## 2015-07-20 DIAGNOSIS — G8929 Other chronic pain: Secondary | ICD-10-CM | POA: Insufficient documentation

## 2015-07-20 DIAGNOSIS — M79672 Pain in left foot: Secondary | ICD-10-CM

## 2015-07-20 DIAGNOSIS — Z833 Family history of diabetes mellitus: Secondary | ICD-10-CM | POA: Insufficient documentation

## 2015-07-20 DIAGNOSIS — Z79899 Other long term (current) drug therapy: Secondary | ICD-10-CM | POA: Insufficient documentation

## 2015-07-20 DIAGNOSIS — F17200 Nicotine dependence, unspecified, uncomplicated: Secondary | ICD-10-CM

## 2015-07-20 DIAGNOSIS — I481 Persistent atrial fibrillation: Secondary | ICD-10-CM

## 2015-07-20 DIAGNOSIS — Z0000 Encounter for general adult medical examination without abnormal findings: Secondary | ICD-10-CM

## 2015-07-20 DIAGNOSIS — Z720 Tobacco use: Secondary | ICD-10-CM

## 2015-07-20 NOTE — Assessment & Plan Note (Signed)
Cessation counseling Quite line information provided

## 2015-07-20 NOTE — Progress Notes (Addendum)
Patient ID: Brian Moody, male   DOB: May 08, 1966, 50 y.o.   MRN: [id number]     Primary Care Physician: Lora Paula, MD Referring Physician: [facility] f/u   Brian Moody is a 50 y.o. male with a h/o tobacco abuse and HTN who presented to [facility] on 06/22/15 with for new-onset dyspnea for the past few months found to be in atrial fibrillation w/ RVR.  He has a long history of HTN and has been taking BP meds for 3 years. He had been out of his meds for several months. He was recently restarted on his BP meds (Lisinopril HCTZ 20-12.5 a day about 2 weeks ago by the nurse at the [facility] and since that time he did not feel well ( frequently thirsty, weak, light headed). He had some lightheadedness, diaphoresis and right sided chest pain at work and presented to the [facility] ED where he was found to be in afib with RVR and AKI felt to be due to dehydration.   He was started on coumadin but then switched to xarelto for chadsvasc score of 1. He was rate controlled with plans for outpatient cardioversion once adequately anticoagulated. He does continue to smoke. Drinks a lot of caffeine, no alcohol, no regular exercise, obese. Admits to snoring and waking up choking and gasping for air at times. Daytime somnolence. 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 He states that he has taken xarelto on a regular basis without missed does and importance of no missed doses emphasized. He is rate controlled today with controlled blood pressure.  He returned to the ER for facial swelling thought possibly related to xarelto, was told to take benadryl, stayed on xarelto and swelling resolved.  On last visit in the afib clinic, he was set up for cardioversion, but he called and cancelled cardioversion because he felt better, thought he had returned to SR and the fact that his wife just had knee surgery and he had to be at home to take care of  her.  He returns today 1/26, and continues in afib. 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 He is willing to be set up for cardioversion in a few weeks after his wife has recovered from knee surgery. He is taking xarelto on a regular basis and is aware not to miss doses.  Today, he denies symptoms of palpitations, chest pain, shortness of breath, orthopnea, PND, lower extremity edema, dizziness, presyncope, syncope, or neurologic sequela. The patient is tolerating medications without difficulties and is otherwise without complaint today.   Past Medical History  Diagnosis Date  . Hypertension   . Persistent atrial fibrillation (HCC)     a. newly diagnosed on 05/2015 admission. started on coumadin.   . Tobacco abuse   . Pre-diabetes     a. HgA1c 6.3 05/2015   No past surgical history on file.  Current Outpatient Prescriptions  Medication Sig Dispense Refill  . albuterol (PROVENTIL HFA;VENTOLIN HFA) 108 (90 BASE) MCG/ACT inhaler Inhale 1-2 puffs into the lungs every 6 (six) hours as needed for wheezing. 1 Inhaler 0  . amLODipine (NORVASC) 10 MG tablet Take 10 mg by mouth daily.  2  . carvedilol (COREG) 12.5 MG tablet Take 1 tablet (12.5 mg total) by mouth 2 (two) times daily with a meal. 60 tablet 6  .  diphenhydrAMINE (BENADRYL) 25 MG tablet Take 1 tablet (25 mg total) by mouth every 6 (six) hours. 20 tablet 0  . rivaroxaban (XARELTO) 20 MG TABS tablet Take 1 tablet (20 mg total) by mouth daily with supper. 30 tablet 11   No current facility-administered medications for this encounter.    No Known Allergies  Social History   Social History  . Marital Status: Single    Spouse Name: N/A  . Number of Children: N/A  . Years of Education: N/A   Occupational History  . Not on file.   Social History Main Topics  . Smoking status: Current Every Day Smoker -- 0.20 packs/day    Types: Cigarettes  .  Smokeless tobacco: Not on file  . Alcohol Use: No  . Drug Use: No  . Sexual Activity: Not on file   Other Topics Concern  . Not on file   Social History Narrative    Family History  Problem Relation Age of Onset  . Hypertension Father   . Diabetes Maternal Grandmother   . Hypertension Maternal Grandmother   . Diabetes Maternal Grandfather   . Hypertension Maternal Grandfather     ROS- All systems are reviewed and negative except as per the HPI above  Physical Exam: Filed Vitals:   07/20/15 1057  BP: 122/88  Pulse: 107  Height:  (1.778 m)  Weight: 244 lb 3.2 oz (110.768 kg)    GEN- The patient is well appearing, alert and oriented x 3 today.   Head- normocephalic, atraumatic Eyes-  Sclera clear, conjunctiva pink Ears- hearing intact Oropharynx- clear Neck- supple, no JVP Lymph- no cervical lymphadenopathy Lungs- Clear to ausculation bilaterally, normal work of breathing Heart- Irregular rate and rhythm, no murmurs, rubs or gallops, PMI not laterally displaced GI- soft, NT, ND, + BS Extremities- no clubbing, cyanosis, or edema MS- no significant deformity or atrophy Skin- no rash or lesion Psych- euthymic mood, full affect Neuro- strength and sensation are intact  EKG- Afib with v rate of 107 bpm, qrs int 76 ms, qt int 371 ms Epic records reviewed Myoview stress test 06/24/15-1. No reversible ischemia or infarction.  2. Moderate global hypokinesis.  3. Left ventricular ejection fraction 41%  4. Intermediate-risk stress test findings*.  Echo-Left ventricle: The cavity size was normal. Wall thickness was increased in a pattern of moderate LVH. Systolic function was normal. The estimated ejection fraction was in the range of 60% to 65%. Wall motion was normal; there were no regional wall motion abnormalities. - Left atrium: The atrium was moderately dilated. - Atrial septum: No defect or patent foramen ovale was identified  Assessment and  Plan:  1.New onset persistent afib Pt d/ced initial cardioversion due to feeling better and wife's recent surgery. However he does continue in afib Will reschedule for cardioversion after pt's wife has recovered from knee surgery. He states no missed doses of xarelto. Has sufficient drug on hand Continue carvedilol for rate control  2.Lifestyle issues Encouraged to lose weight and establish  regular exercise Encouraged tobacco cessation Decrease caffeine intake. Snoring, sleep study scheduled   3. HTN Controlled  F/u in afib clinic in  2 weeks at which time pt will be rescheduled for cardioversion  Lupita Leash C. Matthew Folks [facility] [address] [place], [place] [address] [phone]

## 2015-07-20 NOTE — Progress Notes (Signed)
Subjective:  Patient ID: Brian Moody, male    DOB: 04/17/1966  Age: 50 y.o. MRN: [id number]  CC: Hypertension   HPI Charlestine Night Popescu presents for HTN f/u, he has A fib, planning cardioversion next month he saw his cardiologist earlier today    1. CHRONIC HYPERTENSION  Disease Monitoring  Blood pressure range: not checking   Chest pain: no   Dyspnea: no   Claudication: no   Medication compliance: yes , but fatigue with coreg and itchy rash with Norvasc, no tongue or lip swelling.  Medication Side Effects  Lightheadedness: yes   Urinary frequency: no   Edema: no      Preventitive Healthcare:  Exercise: no   Diet Pattern: regular meals   Salt Restriction: yes  2. Smoking: desires to quit. Interested in trying patch. Smokes when annoyed by family. No CP or SOB.   3. Heel pain: b/l. Reports hx of spurs. Has not yet seen podiatrist. Pain is daily since 2012. No recent injury.   Social History  Substance Use Topics  . Smoking status: Current Every Day Smoker -- 0.20 packs/day    Types: Cigarettes  . Smokeless tobacco: Not on file  . Alcohol Use: No     Outpatient Prescriptions Prior to Visit  Medication Sig Dispense Refill  . albuterol (PROVENTIL HFA;VENTOLIN HFA) 108 (90 BASE) MCG/ACT inhaler Inhale 1-2 puffs into the lungs every 6 (six) hours as needed for wheezing. 1 Inhaler 0  . amLODipine (NORVASC) 10 MG tablet Take 10 mg by mouth daily.  2  . carvedilol (COREG) 12.5 MG tablet Take 1 tablet (12.5 mg total) by mouth 2 (two) times daily with a meal. 60 tablet 6  . diphenhydrAMINE (BENADRYL) 25 MG tablet Take 1 tablet (25 mg total) by mouth every 6 (six) hours. 20 tablet 0  . lisinopril-hydrochlorothiazide (PRINZIDE,ZESTORETIC) 20-12.5 MG tablet Take 1 tablet by mouth daily.  2  . rivaroxaban (XARELTO) 20 MG TABS tablet Take 1 tablet (20 mg total) by mouth daily with supper. 30 tablet 11  . amLODipine (NORVASC) 2.5 MG tablet Take 4 tablets (10 mg total) by mouth  daily. 30 tablet 6   No facility-administered medications prior to visit.    ROS Review of Systems  Constitutional: Negative for fever, chills, fatigue and unexpected weight change.  Eyes: Negative for visual disturbance.  Respiratory: Negative for cough and shortness of breath.   Cardiovascular: Positive for palpitations. Negative for chest pain and leg swelling.  Gastrointestinal: Negative for nausea, vomiting, abdominal pain, diarrhea, constipation and blood in stool.  Endocrine: Negative for polydipsia, polyphagia and polyuria.  Musculoskeletal: Positive for arthralgias (b/l heel pain x 5 years ). Negative for myalgias, back pain, gait problem and neck pain.  Skin: Negative for rash.  Allergic/Immunologic: Negative for immunocompromised state.  Hematological: Negative for adenopathy. Does not bruise/bleed easily.  Psychiatric/Behavioral: Negative for suicidal ideas, sleep disturbance and dysphoric mood. The patient is not nervous/anxious.     Objective:  Resp 16  Ht  (1.778 m)  Wt 243 lb (110.224 kg)  BMI 34.87 kg/m2  BP/Weight 07/20/2015 07/20/2015 07/05/2015  Systolic BP 122 - 114  Diastolic BP 88 - 86  Wt. (Lbs) 244.2 243 242.2  BMI 35.04 34.87 34.75     Physical Exam  Constitutional: He appears well-developed and well-nourished. No distress.  HENT:  Head: Normocephalic and atraumatic.  Neck: Normal range of motion. Neck supple.  Cardiovascular: Normal rate, regular rhythm, normal heart sounds and intact distal pulses.  Pulmonary/Chest: Effort normal and breath sounds normal.  Musculoskeletal: He exhibits no edema.  Neurological: He is alert.  Skin: Skin is warm and dry. No rash noted. No erythema.  Psychiatric: He has a normal mood and affect.     Assessment & Plan:   [name] was seen today for hypertension.  Diagnoses and all orders for this visit:  Heel pain, bilateral -     Ambulatory referral to Podiatry  Healthcare maintenance -      Ambulatory referral to Gastroenterology   Follow-up: No Follow-up on file.   Dessa Phi MD

## 2015-07-20 NOTE — Assessment & Plan Note (Signed)
chronic pain Podiatry referral

## 2015-07-20 NOTE — Patient Instructions (Addendum)
Smoking cessation support: smoking cessation hotline: 1-800-QUIT-NOW.  Smoking cessation classes are available through [facility] and [facility]. Call [phone] or visit our website at [email or web address].  You will be called with GI appt details  Diagnoses and all orders for this visit:  Heel pain, bilateral  Healthcare maintenance -     Ambulatory referral to Gastroenterology   F/u in 4 weeks for bilateral heel pain/heel spurs and post ablation follow up   Dr. Armen Pickup

## 2015-07-20 NOTE — Assessment & Plan Note (Signed)
A: Blood pressure at goal. Meds: compliant  P: I will continue the patient's current medication regimen since her blood pressure is at goal.   

## 2015-07-20 NOTE — Progress Notes (Signed)
F/U HTN Tobacco user 4 cigarette per day  No pain today  No two suicidal thoughts in the past two weeks

## 2015-07-26 MED FILL — ?CARVEDILOL 12.5 MG TABLET: 12.5 | 30 days supply | Qty: 60 | Fill #1

## 2015-08-03 ENCOUNTER — Inpatient Hospital Stay (HOSPITAL_COMMUNITY): Admission: RE | Admit: 2015-08-03 | Payer: Self-pay | Source: Ambulatory Visit | Admitting: Nurse Practitioner

## 2015-08-14 MED FILL — AMLODIPINE BESYLATE 10 MG T: 10 | 30 days supply | Qty: 30 | Fill #2

## 2015-08-28 MED FILL — ?CARVEDILOL 12.5 MG TABLET: 12.5 | 30 days supply | Qty: 60 | Fill #2

## 2015-09-01 ENCOUNTER — Encounter (HOSPITAL_BASED_OUTPATIENT_CLINIC_OR_DEPARTMENT_OTHER): Payer: Medicaid Other

## 2015-09-04 MED FILL — **XARELTO 20 MG TABLET: 20 MG | 30 days supply | Qty: 30 | Fill #0

## 2015-09-04 MED FILL — AMLODIPINE BESYLATE 10 MG T: 10 | 30 days supply | Qty: 30 | Fill #3

## 2015-09-28 MED FILL — ?CARVEDILOL 12.5 MG TABLET: 12.5 | 30 days supply | Qty: 60 | Fill #3

## 2015-10-02 ENCOUNTER — Ambulatory Visit: Payer: Self-pay | Admitting: Family Medicine

## 2015-10-11 MED FILL — ?AMLODIPINE BESYLATE 10 MG: 10 | 30 days supply | Qty: 30 | Fill #4

## 2015-10-23 MED FILL — XARELTO 20 MG TABLET: 20 | 30 days supply | Qty: 30 | Fill #1

## 2015-11-01 MED FILL — ?CARVEDILOL 12.5 MG TABLET: 12.5 | 30 days supply | Qty: 60 | Fill #4

## 2015-11-23 MED FILL — ?AMLODIPINE BESYLATE 10 MG: 10 | 30 days supply | Qty: 30 | Fill #5

## 2015-11-28 MED FILL — XARELTO 20 MG TABLET: 20 | 30 days supply | Qty: 30 | Fill #2

## 2015-12-04 ENCOUNTER — Other Ambulatory Visit: Payer: Self-pay

## 2015-12-04 ENCOUNTER — Encounter: Payer: Self-pay | Admitting: Family Medicine

## 2015-12-04 ENCOUNTER — Ambulatory Visit: Payer: Self-pay | Attending: Family Medicine | Admitting: Family Medicine

## 2015-12-04 VITALS — BP 115/79 | HR 85 | Temp 98.7°F | Resp 16 | Ht 70.0 in | Wt 245.0 lb

## 2015-12-04 DIAGNOSIS — I4819 Other persistent atrial fibrillation: Secondary | ICD-10-CM

## 2015-12-04 DIAGNOSIS — F17210 Nicotine dependence, cigarettes, uncomplicated: Secondary | ICD-10-CM | POA: Insufficient documentation

## 2015-12-04 DIAGNOSIS — R200 Anesthesia of skin: Secondary | ICD-10-CM | POA: Insufficient documentation

## 2015-12-04 DIAGNOSIS — R42 Dizziness and giddiness: Secondary | ICD-10-CM | POA: Insufficient documentation

## 2015-12-04 DIAGNOSIS — Z7901 Long term (current) use of anticoagulants: Secondary | ICD-10-CM | POA: Insufficient documentation

## 2015-12-04 DIAGNOSIS — E559 Vitamin D deficiency, unspecified: Secondary | ICD-10-CM | POA: Insufficient documentation

## 2015-12-04 DIAGNOSIS — R7303 Prediabetes: Secondary | ICD-10-CM | POA: Insufficient documentation

## 2015-12-04 DIAGNOSIS — I481 Persistent atrial fibrillation: Secondary | ICD-10-CM | POA: Insufficient documentation

## 2015-12-04 DIAGNOSIS — R5383 Other fatigue: Secondary | ICD-10-CM | POA: Insufficient documentation

## 2015-12-04 LAB — POCT GLYCOSYLATED HEMOGLOBIN (HGB A1C): HEMOGLOBIN A1C: 5.9

## 2015-12-04 MED ORDER — CARVEDILOL 6.25 MG PO TABS: 6.2500 mg | ORAL_TABLET | Freq: Two times a day (BID) | ORAL | Status: DC

## 2015-12-04 MED FILL — ?CARVEDILOL 6.25 MG TABLET: 6.25 | 30 days supply | Qty: 60 | Fill #0

## 2015-12-04 NOTE — Assessment & Plan Note (Signed)
Persistent A fib Decrease coreg from 12.5 to 6.25 mg BID due to dizziness and fatigue Continue xarelto Advised patient to f/u with cards for cardioversion

## 2015-12-04 NOTE — Patient Instructions (Addendum)
Brian Moody was seen today for atrial fibrillation, dizziness and numbness.  Diagnoses and all orders for this visit:  Persistent atrial fibrillation (HCC) -     CBC -     BASIC METABOLIC PANEL WITH GFR -     carvedilol (COREG) 6.25 MG tablet; Take 1 tablet (6.25 mg total) by mouth 2 (two) times daily with a meal. -     EKG 12-Lead  Pre-diabetes -     HgB A1c  Vitamin D deficiency -     Vitamin D, 25-hydroxy   Donna C. Matthew Folksarroll, ANP-C [facility] [address] [place], [place] [address] [phone]  F/u in 2 weeks here with pharmacist for BP and HR check F/u with me in 2 months for A fib   Dr. Armen PickupFunches

## 2015-12-04 NOTE — Assessment & Plan Note (Signed)
Improved Repeat A1c is 5.9

## 2015-12-04 NOTE — Progress Notes (Signed)
Subjective:  Patient ID: Brian Moody, male    DOB: 04-07-1966  Age: 50 y.o. MRN: [id number]  CC: Atrial Fibrillation; Dizziness; and Numbness   HPI Brian Moody presents for    1. A fib: on coreg and xarelto. No CP or SOB. Has fatigue and dizziness. Has not f/u with his cardiologist with plan cardiversion  Social History  Substance Use Topics  . Smoking status: Current Every Day Smoker -- 0.20 packs/day    Types: Cigarettes  . Smokeless tobacco: Not on file  . Alcohol Use: No    Outpatient Prescriptions Prior to Visit  Medication Sig Dispense Refill  . albuterol (PROVENTIL HFA;VENTOLIN HFA) 108 (90 BASE) MCG/ACT inhaler Inhale 1-2 puffs into the lungs every 6 (six) hours as needed for wheezing. 1 Inhaler 0  . amLODipine (NORVASC) 10 MG tablet Take 10 mg by mouth daily.  2  . carvedilol (COREG) 12.5 MG tablet Take 1 tablet (12.5 mg total) by mouth 2 (two) times daily with a meal. 60 tablet 6  . rivaroxaban (XARELTO) 20 MG TABS tablet Take 1 tablet (20 mg total) by mouth daily with supper. 30 tablet 11  . diphenhydrAMINE (BENADRYL) 25 MG tablet Take 1 tablet (25 mg total) by mouth every 6 (six) hours. (Patient not taking: Reported on 12/04/2015) 20 tablet 0   No facility-administered medications prior to visit.    ROS Review of Systems  Constitutional: Positive for fatigue. Negative for fever, chills and unexpected weight change.  Eyes: Negative for visual disturbance.  Respiratory: Negative for cough and shortness of breath.   Cardiovascular: Negative for chest pain, palpitations and leg swelling.  Gastrointestinal: Negative for nausea, vomiting, abdominal pain, diarrhea, constipation and blood in stool.  Endocrine: Negative for polydipsia, polyphagia and polyuria.  Musculoskeletal: Negative for myalgias, back pain, arthralgias, gait problem and neck pain.  Skin: Negative for rash.  Allergic/Immunologic: Negative for immunocompromised state.  Neurological: Positive  for dizziness.  Hematological: Negative for adenopathy. Does not bruise/bleed easily.  Psychiatric/Behavioral: Negative for suicidal ideas, sleep disturbance and dysphoric mood. The patient is not nervous/anxious.     Objective:  BP 115/79 mmHg  Pulse 85  Temp(Src) 98.7 F (37.1 C) (Oral)  Resp 16  Ht  (1.778 m)  Wt 245 lb (111.131 kg)  BMI 35.15 kg/m2  SpO2 97%  BP/Weight 12/04/2015 07/20/2015 07/20/2015  Systolic BP 115 122 -  Diastolic BP 79 88 -  Wt. (Lbs) 245 244.2 243  BMI 35.15 35.04 34.87   Physical Exam  Constitutional: He appears well-developed and well-nourished. No distress.  HENT:  Head: Normocephalic and atraumatic.  Neck: Normal range of motion. Neck supple.  Cardiovascular: Normal rate, normal heart sounds and intact distal pulses.  An irregularly irregular rhythm present.  Pulmonary/Chest: Effort normal and breath sounds normal.  Musculoskeletal: He exhibits no edema.  Neurological: He is alert.  Skin: Skin is warm and dry. No rash noted. No erythema.  Psychiatric: He has a normal mood and affect.   Lab Results  Component Value Date   HGBA1C 5.9 12/04/2015   EKG: atrial fibrillation, rate 86.  Assessment & Plan:   Brian Moody was seen today for atrial fibrillation, dizziness and numbness.  Diagnoses and all orders for this visit:  Persistent atrial fibrillation (HCC) -     CBC -     BASIC METABOLIC PANEL WITH GFR -     carvedilol (COREG) 6.25 MG tablet; Take 1 tablet (6.25 mg total) by mouth 2 (two) times daily  with a meal. -     EKG 12-Lead  Pre-diabetes -     HgB A1c  Vitamin D deficiency -     Vitamin D, 25-hydroxy    No orders of the defined types were placed in this encounter.    Follow-up: No Follow-up on file.   Dessa PhiJosalyn Tatumn Corbridge MD
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
## 2015-12-04 NOTE — Assessment & Plan Note (Signed)
Repeat vit D check

## 2015-12-05 LAB — CBC
HEMATOCRIT: 43.7 % (ref 38.5–50.0)
Hemoglobin: 14.7 g/dL (ref 13.2–17.1)
MCH: 29.7 pg (ref 27.0–33.0)
MCHC: 33.6 g/dL (ref 32.0–36.0)
MCV: 88.3 fL (ref 80.0–100.0)
MPV: 11.3 fL (ref 7.5–12.5)
PLATELETS: 243 10*3/uL (ref 140–400)
RBC: 4.95 MIL/uL (ref 4.20–5.80)
RDW: 15.1 % — AB (ref 11.0–15.0)
WBC: 7 10*3/uL (ref 3.8–10.8)

## 2015-12-05 LAB — BASIC METABOLIC PANEL WITH GFR
BUN: 12 mg/dL (ref 7–25)
CHLORIDE: 104 mmol/L (ref 98–110)
CO2: 25 mmol/L (ref 20–31)
Calcium: 8.8 mg/dL (ref 8.6–10.3)
Creat: 1.15 mg/dL (ref 0.70–1.33)
GFR, EST AFRICAN AMERICAN: 85 mL/min (ref >=60)
GFR, EST NON AFRICAN AMERICAN: 74 mL/min (ref >=60)
GLUCOSE: 82 mg/dL (ref 65–99)
POTASSIUM: 3.5 mmol/L (ref 3.5–5.3)
Sodium: 141 mmol/L (ref 135–146)

## 2015-12-05 LAB — VITAMIN D 25 HYDROXY (VIT D DEFICIENCY, FRACTURES): VIT D 25 HYDROXY: 11 ng/mL — AB (ref 30–100)

## 2015-12-05 MED ORDER — VITAMIN D (ERGOCALCIFEROL) 1.25 MG (50000 UNIT) PO CAPS: 50000.0000 [IU] | ORAL_CAPSULE | ORAL | Status: DC

## 2015-12-05 NOTE — Addendum Note (Signed)
Addended by: Dessa PhiFUNCHES, Jurrell Royster on: 12/05/2015 11:59 AM   Modules accepted: Orders

## 2015-12-07 ENCOUNTER — Other Ambulatory Visit: Payer: Self-pay | Admitting: *Deleted

## 2015-12-07 MED ORDER — RIVAROXABAN 20 MG PO TABS: 20.0000 mg | ORAL_TABLET | Freq: Every day | ORAL | Status: DC

## 2015-12-12 ENCOUNTER — Telehealth: Payer: Self-pay | Admitting: *Deleted

## 2015-12-14 NOTE — Telephone Encounter (Signed)
Unable to contact pt No voice mail    Normal labs except vit D is low at 11 Will replace vit D with high dose D3 x 12 weeks, then patient should take daily vit D3 at 2000 IU or [id number] IU monthly

## 2016-01-02 MED FILL — AMLODIPINE BESYLATE 10 MG T: 10 | 30 days supply | Qty: 30 | Fill #6

## 2016-01-02 MED FILL — CARVEDILOL 6.25 MG TABLET: 6.25 | 30 days supply | Qty: 60 | Fill #1

## 2016-01-02 MED FILL — XARELTO 20 MG TABLET: 20 | 30 days supply | Qty: 30 | Fill #3

## 2016-01-02 NOTE — Telephone Encounter (Signed)
Rn advised patient per Dr. Armen PickupFunches:  Normal labs except vit D is low at 11 Will replace vit D with high dose D3 x 12 weeks, then patient should take daily vit D3 at 2000 IU or [id number] IU monthly

## 2016-01-03 MED FILL — VIT D2 1.25 MG (50,000 UNIT: 1.25 MG | 84 days supply | Qty: 12 | Fill #0

## 2016-01-30 MED FILL — XARELTO 20 MG TABLET: 20 | 30 days supply | Qty: 30 | Fill #4

## 2016-02-02 MED FILL — AMLODIPINE BESYLATE 10 MG T: 10 | 30 days supply | Qty: 30 | Fill #7

## 2016-02-15 MED FILL — ?CARVEDILOL 6.25 MG TABLET: 6.25 | 30 days supply | Qty: 60 | Fill #2

## 2016-03-08 ENCOUNTER — Other Ambulatory Visit: Payer: Self-pay | Admitting: Family Medicine

## 2016-03-08 DIAGNOSIS — E559 Vitamin D deficiency, unspecified: Secondary | ICD-10-CM

## 2016-03-08 MED FILL — XARELTO 20 MG TABLET: 20 | 30 days supply | Qty: 30 | Fill #5

## 2016-03-08 MED FILL — AMLODIPINE BESYLATE 10 MG T: 10 | 30 days supply | Qty: 30 | Fill #8

## 2016-03-21 ENCOUNTER — Other Ambulatory Visit: Payer: Self-pay | Admitting: Family Medicine

## 2016-03-21 DIAGNOSIS — I4819 Other persistent atrial fibrillation: Secondary | ICD-10-CM

## 2016-03-28 ENCOUNTER — Ambulatory Visit: Payer: Self-pay | Admitting: Family Medicine

## 2016-03-29 ENCOUNTER — Ambulatory Visit: Payer: Self-pay | Attending: Family Medicine | Admitting: Family Medicine

## 2016-03-29 ENCOUNTER — Encounter: Payer: Self-pay | Admitting: Family Medicine

## 2016-03-29 VITALS — BP 140/95 | HR 98 | Temp 98.1°F | Ht 70.0 in | Wt 251.0 lb

## 2016-03-29 DIAGNOSIS — F17210 Nicotine dependence, cigarettes, uncomplicated: Secondary | ICD-10-CM | POA: Insufficient documentation

## 2016-03-29 DIAGNOSIS — E669 Obesity, unspecified: Secondary | ICD-10-CM

## 2016-03-29 DIAGNOSIS — Z6836 Body mass index (BMI) 36.0-36.9, adult: Secondary | ICD-10-CM | POA: Insufficient documentation

## 2016-03-29 DIAGNOSIS — I4819 Other persistent atrial fibrillation: Secondary | ICD-10-CM

## 2016-03-29 DIAGNOSIS — I481 Persistent atrial fibrillation: Secondary | ICD-10-CM | POA: Insufficient documentation

## 2016-03-29 DIAGNOSIS — Z79899 Other long term (current) drug therapy: Secondary | ICD-10-CM | POA: Insufficient documentation

## 2016-03-29 DIAGNOSIS — E559 Vitamin D deficiency, unspecified: Secondary | ICD-10-CM

## 2016-03-29 DIAGNOSIS — Z7901 Long term (current) use of anticoagulants: Secondary | ICD-10-CM | POA: Insufficient documentation

## 2016-03-29 DIAGNOSIS — M545 Low back pain, unspecified: Secondary | ICD-10-CM

## 2016-03-29 DIAGNOSIS — I10 Essential (primary) hypertension: Secondary | ICD-10-CM

## 2016-03-29 MED ORDER — ACETAMINOPHEN-CODEINE #4 300-60 MG PO TABS: 1.0000 | ORAL_TABLET | Freq: Every day | ORAL | 0 refills | Status: DC | PRN

## 2016-03-29 MED ORDER — CARVEDILOL 6.25 MG PO TABS: ORAL_TABLET | ORAL | 11 refills | Status: DC

## 2016-03-29 MED ORDER — AMLODIPINE BESYLATE 10 MG PO TABS: 10.0000 mg | ORAL_TABLET | Freq: Every day | ORAL | 11 refills | Status: DC

## 2016-03-29 MED FILL — CARVEDILOL 6.25 MG TABLET: 6.25 | 30 days supply | Qty: 60 | Fill #0

## 2016-03-29 MED FILL — AMLODIPINE BESYLATE 10 MG T: 10 | 30 days supply | Qty: 30 | Fill #0

## 2016-03-29 NOTE — Patient Instructions (Addendum)
Brian Moody was seen today for medication refill.  Diagnoses and all orders for this visit:  Essential hypertension -     carvedilol (COREG) 6.25 MG tablet; TAKE 1 TABLET BY MOUTH 2 TIMES DAILY WITH A MEAL. -     amLODipine (NORVASC) 10 MG tablet; Take 1 tablet (10 mg total) by mouth daily.  Persistent atrial fibrillation (HCC) -     carvedilol (COREG) 6.25 MG tablet; TAKE 1 TABLET BY MOUTH 2 TIMES DAILY WITH A MEAL.  Midline low back pain without sciatica, unspecified chronicity -     acetaminophen-codeine (TYLENOL #4) 300-60 MG tablet; Take 1 tablet by mouth daily as needed for moderate pain.   Work on weight loss, cut down on biscuits and pies. Increase exercise.  Talk with your cardiologist about possibly changing xarelto to a different blood thinner.   F/u in 6 weeks for back pain   Dr. Armen PickupFunches

## 2016-03-29 NOTE — Assessment & Plan Note (Signed)
Obesity Advised reduced carbohydrate diet Increase exercise, recommended recumbent bike
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
## 2016-03-29 NOTE — Progress Notes (Signed)
Pt needs refills on carvedilol.  Pt declined flu shot.

## 2016-03-29 NOTE — Assessment & Plan Note (Signed)
A: patient having A fib, rate controlled P; Continue coreg Continue xarelto

## 2016-03-30 LAB — VITAMIN D 25 HYDROXY (VIT D DEFICIENCY, FRACTURES): Vit D, 25-Hydroxy: 26 ng/mL — ABNORMAL LOW (ref 30–100)

## 2016-04-01 MED ORDER — VITAMIN D (ERGOCALCIFEROL) 1.25 MG (50000 UNIT) PO CAPS: 50000.0000 [IU] | ORAL_CAPSULE | ORAL | 0 refills | Status: DC

## 2016-04-01 NOTE — Addendum Note (Signed)
Addended by: Dessa PhiFUNCHES, Jaymarie Yeakel on: 04/01/2016 06:18 PM   Modules accepted: Orders

## 2016-04-02 ENCOUNTER — Telehealth: Payer: Self-pay | Admitting: *Deleted

## 2016-04-02 ENCOUNTER — Other Ambulatory Visit: Payer: Self-pay | Admitting: Family Medicine

## 2016-04-02 DIAGNOSIS — E559 Vitamin D deficiency, unspecified: Secondary | ICD-10-CM

## 2016-04-02 MED ORDER — VITAMIN D (ERGOCALCIFEROL) 1.25 MG (50000 UNIT) PO CAPS: 50000.0000 [IU] | ORAL_CAPSULE | ORAL | 0 refills | Status: DC

## 2016-04-02 MED FILL — VIT D2 1.25 MG (50,000 UNIT: 1.25 MG | 84 days supply | Qty: 3 | Fill #0

## 2016-04-02 NOTE — Telephone Encounter (Signed)
Patient verified DOB Patient is aware of Vitamin D improving but still resulting low. Patient is aware of continuing the monthly vitamin d. Patient expressed his understanding and had no further questions at this time.

## 2016-04-02 NOTE — Telephone Encounter (Signed)
-----   Message from Dessa PhiJosalyn Funches, MD sent at 04/01/2016  6:16 PM EDT ----- Vit D improved but still low Continue vit D 50000 IU monthly

## 2016-04-03 ENCOUNTER — Other Ambulatory Visit: Payer: Self-pay | Admitting: Internal Medicine

## 2016-04-03 DIAGNOSIS — M545 Low back pain, unspecified: Secondary | ICD-10-CM

## 2016-04-03 MED ORDER — ACETAMINOPHEN-CODEINE #4 300-60 MG PO TABS: 1.0000 | ORAL_TABLET | Freq: Every day | ORAL | 0 refills | Status: DC | PRN

## 2016-04-03 MED FILL — ACETAMINOPHEN/COD #4 TABLET: 300-60 | 30 days supply | Qty: 30 | Fill #0

## 2016-04-18 ENCOUNTER — Telehealth: Payer: Self-pay | Admitting: Family Medicine

## 2016-04-18 ENCOUNTER — Emergency Department (HOSPITAL_COMMUNITY)
Admission: EM | Admit: 2016-04-18 | Discharge: 2016-04-18 | Disposition: A | Discharge status: Home / Self Care | Payer: Self-pay | Attending: Emergency Medicine | Admitting: Emergency Medicine

## 2016-04-18 ENCOUNTER — Encounter (HOSPITAL_COMMUNITY): Payer: Self-pay

## 2016-04-18 DIAGNOSIS — Z79899 Other long term (current) drug therapy: Secondary | ICD-10-CM | POA: Insufficient documentation

## 2016-04-18 DIAGNOSIS — F17210 Nicotine dependence, cigarettes, uncomplicated: Secondary | ICD-10-CM | POA: Insufficient documentation

## 2016-04-18 DIAGNOSIS — Z7901 Long term (current) use of anticoagulants: Secondary | ICD-10-CM | POA: Insufficient documentation

## 2016-04-18 DIAGNOSIS — I10 Essential (primary) hypertension: Secondary | ICD-10-CM | POA: Insufficient documentation

## 2016-04-18 DIAGNOSIS — N3000 Acute cystitis without hematuria: Secondary | ICD-10-CM | POA: Insufficient documentation

## 2016-04-18 LAB — URINALYSIS, ROUTINE W REFLEX MICROSCOPIC
GLUCOSE, UA: NEGATIVE mg/dL
HGB URINE DIPSTICK: NEGATIVE
Ketones, ur: NEGATIVE mg/dL
Nitrite: NEGATIVE
PROTEIN: NEGATIVE mg/dL
Specific Gravity, Urine: 1.03 (ref 1.005–1.030)
pH: 6 (ref 5.0–8.0)

## 2016-04-18 LAB — I-STAT CHEM 8, ED
BUN: 13 mg/dL (ref 6–20)
CALCIUM ION: 1.14 mmol/L — AB (ref 1.15–1.40)
CREATININE: 1 mg/dL (ref 0.61–1.24)
Chloride: 102 mmol/L (ref 101–111)
GLUCOSE: 131 mg/dL — AB (ref 65–99)
HCT: 47 % (ref 39.0–52.0)
HEMOGLOBIN: 16 g/dL (ref 13.0–17.0)
Potassium: 3.3 mmol/L — ABNORMAL LOW (ref 3.5–5.1)
Sodium: 142 mmol/L (ref 135–145)
TCO2: 26 mmol/L (ref 0–100)

## 2016-04-18 LAB — URINE MICROSCOPIC-ADD ON

## 2016-04-18 MED ORDER — CIPROFLOXACIN HCL 500 MG PO TABS: 500.0000 mg | ORAL_TABLET | Freq: Two times a day (BID) | ORAL | 0 refills | Status: DC

## 2016-04-18 MED ORDER — CIPROFLOXACIN HCL 500 MG PO TABS
500.0000 mg | ORAL_TABLET | ORAL | Status: AC
  Administered 2016-04-18: 500 mg via ORAL
  Filled 2016-04-18: qty 1

## 2016-04-18 NOTE — ED Notes (Signed)
Patient states unable to give urine sample at this time.  Given a cup of water.

## 2016-04-18 NOTE — Telephone Encounter (Signed)
Will route to PCP 

## 2016-04-18 NOTE — Telephone Encounter (Signed)
Patient called the office to inform PCP that he is still having back pains and that medication (Tylenol 4) is not helping with the pain. No appt. available with PCP at the moment. Please follow up.

## 2016-04-18 NOTE — Telephone Encounter (Signed)
Patient advised to call his cardiologist about possible change from xarelto to a different anticoagulant

## 2016-04-18 NOTE — ED Triage Notes (Addendum)
Patient complains of back pain x 4 days. States that the pain started after cold and cough symptoms earlier in week. Now pain with bending and ambulation, NAD. No dysuria.

## 2016-04-18 NOTE — ED Provider Notes (Signed)
[facility]-EMERGENCY DEPT Provider Note   CSN: [id number] Arrival date & time: 04/18/16  1500  By signing my name below, I, Linna DarnerRussell Turner, attest that this documentation has been prepared under the direction and in the presence of Felicie Mornavid Chelsee Hosie, NP. Electronically Signed: Linna Darnerussell Turner, Scribe. 04/18/2016. 4:12 PM.  History   Chief Complaint Chief Complaint  Patient presents with  . Back Pain    The history is provided by the patient. No language interpreter was used.  Back Pain   This is a new problem. The current episode started more than 2 days ago. The problem occurs constantly. The problem has not changed since onset.The pain is associated with no known injury. The pain is present in the lumbar spine. The pain does not radiate. The pain is moderate. The pain is the same all the time. Associated symptoms include abdominal pain (intermittent, after urinating). Pertinent negatives include no fever, no abdominal swelling, no bowel incontinence, no bladder incontinence and no dysuria. He has tried nothing for the symptoms. Risk factors include obesity.     HPI Comments: Brian Moody is a 50 y.o. male who presents to the Emergency Department complaining of constant bilateral lower back pain for the last several days. Pt reports associated bilateral flank pain and urinary frequency. He notes he was seen by his PCP for lumbar spinal pain earlier this month, but states his current back pain is different. Pt notes some occasional sharp abdominal pain after urinating but denies abdominal pain currently. He denies difficulty urinating, hematuria, fever, chills, or any other associated symptoms.  Past Medical History:  Diagnosis Date  . Hypertension   . Persistent atrial fibrillation (HCC)    a. newly diagnosed on 05/2015 admission. started on coumadin.   . Pre-diabetes    a. HgA1c 6.3 05/2015  . Tobacco abuse     Patient Active Problem List   Diagnosis Date Noted  . Midline low back pain  without sciatica 03/29/2016  . Obesity (BMI 30-39.9) 03/29/2016  . Heel pain, bilateral 07/20/2015  . Essential hypertension   . Tobacco abuse   . Persistent atrial fibrillation (HCC)   . Pre-diabetes   . Vitamin D insufficiency 08/24/2013  . Smoking 08/24/2013  . Other and unspecified hyperlipidemia 08/24/2013  . Avulsion fracture of distal fibula 06/02/2013  . Smoker 12/30/2012  . Generalized headaches 12/30/2012    History reviewed. No pertinent surgical history.     Home Medications    Prior to Admission medications   Medication Sig Start Date End Date Taking? Authorizing Provider  acetaminophen-codeine (TYLENOL #4) 300-60 MG tablet Take 1 tablet by mouth daily as needed for moderate pain. 04/03/16   Quentin Angstlugbemiga E Jegede, MD  albuterol (PROVENTIL HFA;VENTOLIN HFA) 108 (90 BASE) MCG/ACT inhaler Inhale 1-2 puffs into the lungs every 6 (six) hours as needed for wheezing. 07/01/14   Rolland PorterMark James, MD  amLODipine (NORVASC) 10 MG tablet Take 1 tablet (10 mg total) by mouth daily. 03/29/16   Josalyn Funches, MD  carvedilol (COREG) 6.25 MG tablet TAKE 1 TABLET BY MOUTH 2 TIMES DAILY WITH A MEAL. 03/29/16   Josalyn Funches, MD  diphenhydrAMINE (BENADRYL) 25 MG tablet Take 1 tablet (25 mg total) by mouth every 6 (six) hours. Patient not taking: Reported on 03/29/2016 06/27/15   Eyvonne MechanicJeffrey Hedges, PA-C  rivaroxaban (XARELTO) 20 MG TABS tablet Take 1 tablet (20 mg total) by mouth daily with supper. 12/07/15   Dessa PhiJosalyn Funches, MD  Vitamin D, Ergocalciferol, (DRISDOL) 50000 units CAPS capsule Take 1  capsule (50,000 Units total) by mouth every 30 (thirty) days. 04/02/16   Dessa Phi, MD    Family History Family History  Problem Relation Age of Onset  . Hypertension Father   . Diabetes Maternal Grandmother   . Hypertension Maternal Grandmother   . Diabetes Maternal Grandfather   . Hypertension Maternal Grandfather     Social History Social History  Substance Use Topics  . Smoking status:  Current Every Day Smoker    Packs/day: 0.20    Types: Cigarettes  . Smokeless tobacco: Not on file  . Alcohol use No     Allergies   Review of patient's allergies indicates no known allergies.   Review of Systems Review of Systems  Constitutional: Negative for chills and fever.  Gastrointestinal: Positive for abdominal pain (intermittent, after urinating). Negative for bowel incontinence.  Genitourinary: Positive for flank pain (bilateral) and frequency. Negative for bladder incontinence, difficulty urinating, dysuria and hematuria.  Musculoskeletal: Positive for back pain (bilateral lower).  All other systems reviewed and are negative.   Physical Exam Updated Vital Signs BP 132/100 (BP Location: Right Arm)   Pulse 90   Temp 98.2 F (36.8 C) (Oral)   Resp 18   SpO2 99%   Physical Exam  Constitutional: He is oriented to person, place, and time. He appears well-developed and well-nourished. No distress.  HENT:  Head: Normocephalic and atraumatic.  Eyes: Conjunctivae and EOM are normal.  Neck: Neck supple. No tracheal deviation present.  Cardiovascular: Normal rate.   Pulmonary/Chest: Effort normal. No respiratory distress.  Abdominal: There is tenderness.  Bilateral CVA tenderness.  Musculoskeletal: Normal range of motion.  Midline back pain.  Neurological: He is alert and oriented to person, place, and time.  Skin: Skin is warm and dry.  Psychiatric: He has a normal mood and affect. His behavior is normal.  Nursing note and vitals reviewed.   ED Treatments / Results  Labs (all labs ordered are listed, but only abnormal results are displayed) Labs Reviewed  URINALYSIS, ROUTINE W REFLEX MICROSCOPIC (NOT AT [facility]) - Abnormal; Notable for the following:       Result Value   Color, Urine AMBER (*)    APPearance CLOUDY (*)    Bilirubin Urine SMALL (*)    Leukocytes, UA MODERATE (*)    All other components within normal limits  URINE MICROSCOPIC-ADD ON - Abnormal;  Notable for the following:    Squamous Epithelial / LPF 6-30 (*)    Bacteria, UA FEW (*)    All other components within normal limits  I-STAT CHEM 8, ED - Abnormal; Notable for the following:    Potassium 3.3 (*)    Glucose, Bld 131 (*)    Calcium, Ion 1.14 (*)    All other components within normal limits    EKG  EKG Interpretation None       Radiology No results found.  Procedures Procedures (including critical care time)  DIAGNOSTIC STUDIES: Oxygen Saturation is 99% on RA, normal by my interpretation.    COORDINATION OF CARE: 4:17 PM Discussed treatment plan with pt at bedside and pt agreed to plan.  Medications Ordered in ED Medications - No data to display   Initial Impression / Assessment and Plan / ED Course  I have reviewed the triage vital signs and the nursing notes.  Pertinent labs & imaging results that were available during my care of the patient were reviewed by me and considered in my medical decision making (see chart for details).  Clinical Course  Pt diagnosed with a UTI. Pt is afebrile, no tachycardia, hypotension, or other signs of serious infection.  Pt to be dc home with antibiotics and instructions to follow up with PCP if symptoms persist. Discussed return precautions. Pt appears safe for discharge.   I personally performed the services described in this documentation, which was scribed in my presence. The recorded information has been reviewed and is accurate.   Final Clinical Impressions(s) / ED Diagnoses   Final diagnoses:  Acute cystitis without hematuria    New Prescriptions Current Discharge Medication List    START taking these medications   Details  ciprofloxacin (CIPRO) 500 MG tablet Take 1 tablet (500 mg total) by mouth 2 (two) times daily. Qty: 13 tablet, Refills: 0         Felicie Morn, NP 04/18/16 2006    Pricilla Loveless, MD 04/20/16 [phone]

## 2016-04-18 NOTE — ED Notes (Signed)
Papers reviewed and medications deiscussed. And expresses intent to follow up

## 2016-04-19 MED FILL — CIPROFLOXACIN HCL 500 MG TA: 500 | 6 days supply | Qty: 13 | Fill #0

## 2016-04-20 LAB — URINE CULTURE: SPECIAL REQUESTS: NORMAL

## 2016-04-24 NOTE — Telephone Encounter (Signed)
Pt was called and informed to call his cardiologist to change medications.

## 2016-04-30 ENCOUNTER — Encounter (HOSPITAL_COMMUNITY): Payer: Self-pay | Admitting: Nurse Practitioner

## 2016-04-30 ENCOUNTER — Other Ambulatory Visit (HOSPITAL_COMMUNITY): Payer: Self-pay

## 2016-04-30 ENCOUNTER — Ambulatory Visit (HOSPITAL_COMMUNITY)
Admission: RE | Admit: 2016-04-30 | Discharge: 2016-04-30 | Disposition: A | Discharge status: Home / Self Care | Payer: Self-pay | Source: Ambulatory Visit | Attending: Nurse Practitioner | Admitting: Nurse Practitioner

## 2016-04-30 VITALS — BP 126/84 | HR 123 | Ht 70.0 in | Wt 248.0 lb

## 2016-04-30 DIAGNOSIS — I4819 Other persistent atrial fibrillation: Secondary | ICD-10-CM

## 2016-04-30 DIAGNOSIS — F17210 Nicotine dependence, cigarettes, uncomplicated: Secondary | ICD-10-CM | POA: Insufficient documentation

## 2016-04-30 DIAGNOSIS — Z7901 Long term (current) use of anticoagulants: Secondary | ICD-10-CM | POA: Insufficient documentation

## 2016-04-30 DIAGNOSIS — I10 Essential (primary) hypertension: Secondary | ICD-10-CM | POA: Insufficient documentation

## 2016-04-30 DIAGNOSIS — I481 Persistent atrial fibrillation: Secondary | ICD-10-CM | POA: Insufficient documentation

## 2016-04-30 DIAGNOSIS — R7303 Prediabetes: Secondary | ICD-10-CM | POA: Insufficient documentation

## 2016-04-30 MED ORDER — APIXABAN 5 MG PO TABS
5.0000 mg | ORAL_TABLET | Freq: Two times a day (BID) | ORAL | 6 refills | Status: DC
Start: 2016-04-30 — End: 2016-05-06

## 2016-04-30 MED ORDER — CARVEDILOL 12.5 MG PO TABS: 12.5000 mg | ORAL_TABLET | Freq: Two times a day (BID) | ORAL | 3 refills | Status: DC

## 2016-04-30 MED ORDER — APIXABAN 5 MG PO TABS: 5.0000 mg | ORAL_TABLET | Freq: Two times a day (BID) | ORAL | 0 refills | Status: DC

## 2016-04-30 MED ORDER — APIXABAN 5 MG PO TABS: 5.0000 mg | ORAL_TABLET | Freq: Two times a day (BID) | ORAL | 6 refills | Status: DC

## 2016-04-30 MED FILL — ?CARVEDILOL 12.5 MG TABLET: 12.5 | 30 days supply | Qty: 60 | Fill #0

## 2016-04-30 NOTE — Patient Instructions (Signed)
Your physician has recommended you make the following change in your medication:  1)Stop xarelto 2)Start Eliquis 5mg  twice a day 3)Start coreg 12.5mg  twice a day

## 2016-04-30 NOTE — Progress Notes (Signed)
Patient ID: Brian Gagehillip A Marquess, male   DOB: 11/03/1965, 50 y.o.   MRN: [id number]     Primary Care Physician: Lora PaulaFUNCHES, JOSALYN C, MD Referring Physician[facility]   Brian Moody is a 50 y.o. male with a h/o tobacco abuse and HTN who [facility] with for new-onset dyspnea for the past few months found to be in atrial fibrillation w/ RVR.  He has a long history of HTN and has been taking BP meds for 3 years. He had been out of his meds for several months. He was recently restarted on his BP meds (Lisinopril HCTZ 20-12.5 a day about 2 weeks ago by [facility] and since that time he did not feel well ( frequently thirsty, weak, light headed). He had some lightheadedness, diaphoresis and right sided chest pain at work and [facility] where he was found to be in afib with RVR and AKI felt to be due to dehydration.   He was started on coumadin but then switched to xarelto for chadsvasc score of 1. He was rate controlled with plans for outpatient cardioversion once adequately anticoagulated. He does continue to smoke. Drinks a lot of caffeine, no alcohol, no regular exercise, obese. Admits to snoring and waking up choking and gasping for air at times. Daytime somnolence. 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 He states that he has taken xarelto on a regular basis without missed does and importance of no missed doses emphasized. He is rate controlled today with controlled blood pressure.  He returned to the ER for facial swelling thought possibly related to xarelto, was told to take benadryl, stayed on xarelto and swelling resolved.  On last visit in the afib clinic, he was set up for cardioversion, but he called and cancelled cardioversion because he felt better, thought he had returned to SR and the fact that his wife just had knee surgery and he had to be at home to take care of  her.  He returns today 1/26, and continues in afib. 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 He is willing to be set up for cardioversion in a few weeks after his wife has recovered from knee surgery. He is taking xarelto on a regular basis and is aware not to miss doses.  F/u in afib clinic 11/7. Pt never went thru with cardioversion and am now seeing back after not seeing pt since early 2017.  He is here to see if he needs to continue DOAC. He believes it is making him have back pain. He is living in afib this past year but today he has RVR in the 120's. He states that he is asymptomatic. I question if he may have developed TMC if he has had afib with rvr for the last 10+ months.    Today, he denies symptoms of palpitations, chest pain, shortness of breath, orthopnea, PND, lower extremity edema, dizziness, presyncope, syncope, or neurologic sequela. C/o back pain. The patient is tolerating medications without difficulties and is otherwise without complaint today.   Past Medical History:  Diagnosis Date  . Hypertension   . Persistent atrial fibrillation (HCC)    a. newly diagnosed on 05/2015 admission. started on coumadin.   . Pre-diabetes    a. HgA1c 6.3 05/2015  .  Tobacco abuse    No past surgical history on file.  Current Outpatient Prescriptions  Medication Sig Dispense Refill  . acetaminophen-codeine (TYLENOL #4) 300-60 MG tablet Take 1 tablet by mouth daily as needed for moderate pain. 30 tablet 0  . albuterol (PROVENTIL HFA;VENTOLIN HFA) 108 (90 BASE) MCG/ACT inhaler Inhale 1-2 puffs into the lungs every 6 (six) hours as needed for wheezing. 1 Inhaler 0  . amLODipine (NORVASC) 10 MG tablet Take 1 tablet (10 mg total) by mouth daily. 30 tablet 11  . carvedilol (COREG) 12.5 MG tablet Take 1 tablet (12.5 mg total) by mouth 2 (two) times daily with a meal. 60 tablet 3  . diphenhydrAMINE (BENADRYL) 25 MG  tablet Take 1 tablet (25 mg total) by mouth every 6 (six) hours. 20 tablet 0  . apixaban (ELIQUIS) 5 MG TABS tablet Take 1 tablet (5 mg total) by mouth 2 (two) times daily. 60 tablet 0  . apixaban (ELIQUIS) 5 MG TABS tablet Take 1 tablet (5 mg total) by mouth 2 (two) times daily. 60 tablet 6   No current facility-administered medications for this encounter.     No Known Allergies  Social History   Social History  . Marital status: Single    Spouse name: N/A  . Number of children: N/A  . Years of education: N/A   Occupational History  . Not on file.   Social History Main Topics  . Smoking status: Current Every Day Smoker    Packs/day: 0.20    Types: Cigarettes  . Smokeless tobacco: Not on file  . Alcohol use No  . Drug use: No  . Sexual activity: Not on file   Other Topics Concern  . Not on file   Social History Narrative  . No narrative on file    Family History  Problem Relation Age of Onset  . Hypertension Father   . Diabetes Maternal Grandmother   . Hypertension Maternal Grandmother   . Diabetes Maternal Grandfather   . Hypertension Maternal Grandfather     ROS- All systems are reviewed and negative except as per the HPI above  Physical Exam: Vitals:   04/30/16 1354  BP: 126/84  Pulse: (!) 123  Weight: 248 lb (112.5 kg)  Height: 5\' 10"  (1.778 m)    GEN- The patient is well appearing, alert and oriented x 3 today.   Head- normocephalic, atraumatic Eyes-  Sclera clear, conjunctiva pink Ears- hearing intact Oropharynx- clear Neck- supple, no JVP Lymph- no cervical lymphadenopathy Lungs- Clear to ausculation bilaterally, normal work of breathing Heart- Irregular rate and rhythm, no murmurs, rubs or gallops, PMI not laterally displaced GI- soft, NT, ND, + BS Extremities- no clubbing, cyanosis, or edema MS- no significant deformity or atrophy Skin- no rash or lesion Psych- euthymic mood, full affect Neuro- strength and sensation are intact  EKG-  Afib with v rate of 123 bpm, qrs int 78 ms, qt int 372 ms Epic records reviewed Myoview stress test 06/24/15-1. No reversible ischemia or infarction.  2. Moderate global hypokinesis.  3. Left ventricular ejection fraction 41%  4. Intermediate-risk stress test findings*.  Echo-Left ventricle: The cavity size was normal. Wall thickness was increased in a pattern of moderate LVH. Systolic function was normal. The estimated ejection fraction was in the range of 60% to 65%. Wall motion was normal; there were no regional wall motion abnormalities. - Left atrium: The atrium was moderately dilated. - Atrial septum: No defect or patent foramen ovale was identified  Assessment  and Plan:  1.Persistent afib Pt cancelled cardioversion  in January and did not reschedule in aifb clinc However he does continue in afib with RVR, from which he says that he is asymptomatic  Increase carvedilol to 12.5 mg bid Change xarelto to eliquis 5 mg bid from  for pt's concern that xarelto is causing back pain He has a chadsvasc score of 2(HTN, LV dysfunction) Update echo, I fear TMC if pt has been in afib with rvr for a number of months    2.Lifestyle issues Encouraged to lose weight and establish  regular exercise Encouraged tobacco cessation Decrease caffeine intake. Snoring, sleep study was scheduled but pt did not follow through with test  3. HTN Controlled  F/u in afib clinic after echo  Lupita Leash C. Matthew Folks [facility] [address] [place], [place] [address] [phone]

## 2016-05-06 ENCOUNTER — Other Ambulatory Visit: Payer: Self-pay

## 2016-05-06 MED ORDER — APIXABAN 5 MG PO TABS: 5.0000 mg | ORAL_TABLET | Freq: Two times a day (BID) | ORAL | 3 refills | Status: DC

## 2016-05-10 ENCOUNTER — Ambulatory Visit (HOSPITAL_COMMUNITY)
Admission: RE | Admit: 2016-05-10 | Discharge: 2016-05-10 | Disposition: A | Discharge status: Home / Self Care | Payer: Self-pay | Source: Ambulatory Visit | Attending: Nurse Practitioner | Admitting: Nurse Practitioner

## 2016-05-10 DIAGNOSIS — Z6835 Body mass index (BMI) 35.0-35.9, adult: Secondary | ICD-10-CM | POA: Insufficient documentation

## 2016-05-10 DIAGNOSIS — I4819 Other persistent atrial fibrillation: Secondary | ICD-10-CM

## 2016-05-10 DIAGNOSIS — F17200 Nicotine dependence, unspecified, uncomplicated: Secondary | ICD-10-CM | POA: Insufficient documentation

## 2016-05-10 DIAGNOSIS — I481 Persistent atrial fibrillation: Secondary | ICD-10-CM | POA: Insufficient documentation

## 2016-05-10 DIAGNOSIS — E669 Obesity, unspecified: Secondary | ICD-10-CM | POA: Insufficient documentation

## 2016-05-10 DIAGNOSIS — I10 Essential (primary) hypertension: Secondary | ICD-10-CM | POA: Insufficient documentation

## 2016-05-10 LAB — ECHOCARDIOGRAM COMPLETE
CHL CUP DOP CALC LVOT VTI: 14 cm
CHL CUP MV DEC (S): 208
CHL CUP STROKE VOLUME: 42 mL
CHL CUP TV REG PEAK VELOCITY: 190 cm/s
E decel time: 208 msec
E/e' ratio: 10.31
FS: 21 % — AB (ref 28–44)
IV/PV OW: 1.07
LA ID, A-P, ES: 42 mm
LA diam index: 1.83 cm/m2
LA vol A4C: 60.5 ml
LA vol: 56.6 mL
LAVOLIN: 24.7 mL/m2
LEFT ATRIUM END SYS DIAM: 42 mm
LV PW d: 10.9 mm — AB (ref 0.6–1.1)
LV TDI E'MEDIAL: 8.33
LV dias vol index: 33 mL/m2
LV sys vol index: 15 mL/m2
LV sys vol: 33 mL (ref 21–61)
LVDIAVOL: 75 mL (ref 62–150)
LVEEAVG: 10.31
LVEEMED: 10.31
LVELAT: 9.08 cm/s
LVOT area: 3.46 cm2
LVOTD: 21 mm
LVOTPV: 89.1 cm/s
LVOTSV: 48 mL
MVPG: 4 mmHg
MVPKEVEL: 93.6 m/s
Simpson's disk: 56
TDI e' lateral: 9.08
TR max vel: 190 cm/s

## 2016-05-10 NOTE — Progress Notes (Signed)
  Echocardiogram 2D Echocardiogram has been performed.  Nolon RodBrown, Tony 05/10/2016, 3:48 PM

## 2016-05-15 ENCOUNTER — Encounter (HOSPITAL_COMMUNITY): Payer: Self-pay | Admitting: Nurse Practitioner

## 2016-05-15 ENCOUNTER — Other Ambulatory Visit (HOSPITAL_COMMUNITY): Payer: Self-pay | Admitting: *Deleted

## 2016-05-15 ENCOUNTER — Ambulatory Visit (HOSPITAL_COMMUNITY)
Admission: RE | Admit: 2016-05-15 | Discharge: 2016-05-15 | Disposition: A | Discharge status: Home / Self Care | Payer: Self-pay | Source: Ambulatory Visit | Attending: Nurse Practitioner | Admitting: Nurse Practitioner

## 2016-05-15 VITALS — BP 142/74 | Ht 70.0 in | Wt 251.4 lb

## 2016-05-15 DIAGNOSIS — R000 Tachycardia, unspecified: Secondary | ICD-10-CM | POA: Insufficient documentation

## 2016-05-15 DIAGNOSIS — I481 Persistent atrial fibrillation: Secondary | ICD-10-CM | POA: Insufficient documentation

## 2016-05-15 DIAGNOSIS — I4819 Other persistent atrial fibrillation: Secondary | ICD-10-CM

## 2016-05-15 DIAGNOSIS — I252 Old myocardial infarction: Secondary | ICD-10-CM | POA: Insufficient documentation

## 2016-05-15 MED ORDER — APIXABAN 5 MG PO TABS: 5.0000 mg | ORAL_TABLET | Freq: Two times a day (BID) | ORAL | 3 refills | Status: DC

## 2016-05-15 MED ORDER — APIXABAN 5 MG PO TABS
5.0000 mg | ORAL_TABLET | Freq: Two times a day (BID) | ORAL | 3 refills | Status: DC
Start: 2016-05-15 — End: 2016-05-15

## 2016-05-15 MED FILL — $ELIQUIS 5 MG TABLET: 5 | 14 days supply | Qty: 28 | Fill #0

## 2016-05-15 NOTE — Progress Notes (Signed)
Patient ID: Brian Moody, male   DOB: 04/15/1966, 50 y.o.   MRN: [id number]     Primary Care Physician: Lora PaulaFUNCHES, JOSALYN C, MD Referring Physician: [facility]   Brian Moody is a 50 y.o. male with a h/o tobacco abuse and HTN who [facility] with for new-onset dyspnea for the past few months found to be in atrial fibrillation w/ RVR.  He has a long history of HTN and has been taking BP meds for 3 years. He had been out of his meds for several months. He was recently restarted on his BP meds (Lisinopril HCTZ 20-12.5 a day about 2 weeks ago [facility] and since that time he did not feel well ( frequently thirsty, weak, light headed). He had some lightheadedness, diaphoresis and right sided chest pain at work and [facility] where he was found to be in afib with RVR and AKI felt to be due to dehydration.   He was started on coumadin but then switched to xarelto for chadsvasc score of 1. He was rate controlled with plans for outpatient cardioversion once adequately anticoagulated. He does continue to smoke. Drinks a lot of caffeine, no alcohol, no regular exercise, obese. Admits to snoring and waking up choking and gasping for air at times. Daytime somnolence. 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 He states that he has taken xarelto on a regular basis without missed does and importance of no missed doses emphasized. He is rate controlled today with controlled blood pressure.  He returned to the ER for facial swelling thought possibly related to xarelto, was told to take benadryl, stayed on xarelto and swelling resolved.  On last visit in the afib clinic, he was set up for cardioversion, but he called and cancelled cardioversion because he felt better, thought he had returned to SR and the fact that his wife just had knee surgery and he had to be at home to take care of  her.  He returns today 1/26, and continues in afib. 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 He is willing to be set up for cardioversion in a few weeks after his wife has recovered from knee surgery. He is taking xarelto on a regular basis and is aware not to miss doses.  F/u in afib clinic 11/7. Pt never went thru with cardioversion and am now seeing back after not seeing pt since early 2017.  He is here to see if he needs to continue DOAC. He believes it is making him have back pain. He is living in afib this past year but today he has RVR in the 120's. He states that he is asymptomatic. I question if he may have developed TMC if he has had afib with rvr for the last 10+ months.   F/u in afib clinic, echo reviewed with pt and with normal structure. Being in chronic afib has not effected EF. I discussed trying to restore SR but he is not in favor of a cardioversion which is likely to be needed if AAD therapy is use. I doubt pt would be compliant with AAD because this is the second visit that he said he forgot to take his am meds prior to coming to clinic. His back  feels better on  eliquis but he also bought a new mattress. He will stay on this drug. He continues to  smoke heavily.  Today, he denies symptoms of palpitations, chest pain, shortness of breath, orthopnea, PND, lower extremity edema, dizziness, presyncope, syncope, or neurologic sequela. C/o back pain. The patient is tolerating medications without difficulties and is otherwise without complaint today.   Past Medical History:  Diagnosis Date  . Hypertension   . Persistent atrial fibrillation (HCC)    a. newly diagnosed on 05/2015 admission. started on coumadin.   . Pre-diabetes    a. HgA1c 6.3 05/2015  . Tobacco abuse    No past surgical history on file.  Current Outpatient Prescriptions  Medication Sig Dispense Refill  . albuterol (PROVENTIL  HFA;VENTOLIN HFA) 108 (90 BASE) MCG/ACT inhaler Inhale 1-2 puffs into the lungs every 6 (six) hours as needed for wheezing. 1 Inhaler 0  . amLODipine (NORVASC) 10 MG tablet Take 1 tablet (10 mg total) by mouth daily. 30 tablet 11  . apixaban (ELIQUIS) 5 MG TABS tablet Take 1 tablet (5 mg total) by mouth 2 (two) times daily. 180 tablet 3  . carvedilol (COREG) 12.5 MG tablet Take 1 tablet (12.5 mg total) by mouth 2 (two) times daily with a meal. 60 tablet 3  . acetaminophen-codeine (TYLENOL #4) 300-60 MG tablet Take 1 tablet by mouth daily as needed for moderate pain. (Patient not taking: Reported on 05/15/2016) 30 tablet 0   No current facility-administered medications for this encounter.     No Known Allergies  Social History   Social History  . Marital status: Married    Spouse name: N/A  . Number of children: N/A  . Years of education: N/A   Occupational History  . Not on file.   Social History Main Topics  . Smoking status: Current Every Day Smoker    Packs/day: 0.20    Types: Cigarettes  . Smokeless tobacco: Not on file  . Alcohol use No  . Drug use: No  . Sexual activity: Not on file   Other Topics Concern  . Not on file   Social History Narrative  . No narrative on file    Family History  Problem Relation Age of Onset  . Hypertension Father   . Diabetes Maternal Grandmother   . Hypertension Maternal Grandmother   . Diabetes Maternal Grandfather   . Hypertension Maternal Grandfather     ROS- All systems are reviewed and negative except as per the HPI above  Physical Exam: Vitals:   05/15/16 1017  BP: (!) 142/74  Weight: 251 lb 6.4 oz (114 kg)  Height: 5\' 10"  (1.778 m)    GEN- The patient is well appearing, alert and oriented x 3 today.   Head- normocephalic, atraumatic Eyes-  Sclera clear, conjunctiva pink Ears- hearing intact Oropharynx- clear Neck- supple, no JVP Lymph- no cervical lymphadenopathy Lungs- Clear to ausculation bilaterally, normal  work of breathing Heart- Irregular rate and rhythm, no murmurs, rubs or gallops, PMI not laterally displaced GI- soft, NT, ND, + BS Extremities- no clubbing, cyanosis, or edema MS- no significant deformity or atrophy Skin- no rash or lesion Psych- euthymic mood, full affect Neuro- strength and sensation are intact  EKG- Afib with v rate of 105 bpm, qrs int 80 ms, qt int 436 ms Epic records reviewed Myoview stress test 06/24/15-1. No reversible ischemia or infarction.  2. Moderate global hypokinesis.  3. Left ventricular ejection fraction 41%  4. Intermediate-risk stress test findings*.  Echo-11/17-Study Conclusions  -  Left ventricle: The cavity size was normal. Wall thickness was   normal. Systolic function was normal. The estimated ejection   fraction was in the range of 55% to 60%.   Assessment and Plan:  1.Persistent afib Pt cancelled cardioversion  in January and did not reschedule in aifb clinic Returned to clinic in November 2017 However he does continue in afib with RVR, from which he says that he is asymptomatic  Continue carvedilol 12.5 mg bid, but pt asked to please take before next clinic appointment so I cn see if he is truly rate controlled, echo does not appear to have any detrimental effect from  afib Continue eliquis 5 mg bid from  for pt's concern that xarelto is causing back pain He has a chadsvasc score of  1 to soft 3(HTN,  LV dysfunction on previous echo, pre diabetic at one time), I would like him to continue drug to minimize risk of stroke He does not wish to purse getting back in SR at this point and I question if he would be compliant with drug, since he cant remember to take meds before he comes to clinic    2.Lifestyle issues Encouraged to lose weight and establish  regular exercise Encouraged tobacco cessation Decrease caffeine intake. Snoring, sleep study was scheduled but pt did not follow through with test He thinks he may snore, he will  record himself and if snoring present, he will call the office  3. HTN Controlled  F/u in afib clinic  In 3 months, please try to take meds prior to next visit  Lupita Leash C. Matthew Folks [facility] [address] [place], [place] [address] [phone]

## 2016-06-10 MED FILL — CARVEDILOL 12.5 MG TABLET: 12.5 | 30 days supply | Qty: 60 | Fill #5

## 2016-06-19 MED FILL — $ELIQUIS 5 MG TABLET: 5 | 14 days supply | Qty: 28 | Fill #1

## 2016-06-25 MED FILL — ?AMLODIPINE BESYLATE 10 MG: 10 | 30 days supply | Qty: 30 | Fill #1

## 2016-07-01 ENCOUNTER — Ambulatory Visit: Payer: Self-pay | Attending: Family Medicine | Admitting: Family Medicine

## 2016-07-01 ENCOUNTER — Encounter: Payer: Self-pay | Admitting: Family Medicine

## 2016-07-01 VITALS — BP 118/80 | HR 93 | Temp 97.8°F | Ht 70.0 in | Wt 245.4 lb

## 2016-07-01 DIAGNOSIS — Z202 Contact with and (suspected) exposure to infections with a predominantly sexual mode of transmission: Secondary | ICD-10-CM | POA: Insufficient documentation

## 2016-07-01 DIAGNOSIS — M545 Low back pain, unspecified: Secondary | ICD-10-CM

## 2016-07-01 DIAGNOSIS — G8929 Other chronic pain: Secondary | ICD-10-CM | POA: Insufficient documentation

## 2016-07-01 DIAGNOSIS — I10 Essential (primary) hypertension: Secondary | ICD-10-CM | POA: Insufficient documentation

## 2016-07-01 DIAGNOSIS — Z7901 Long term (current) use of anticoagulants: Secondary | ICD-10-CM | POA: Insufficient documentation

## 2016-07-01 DIAGNOSIS — F17210 Nicotine dependence, cigarettes, uncomplicated: Secondary | ICD-10-CM | POA: Insufficient documentation

## 2016-07-01 DIAGNOSIS — I4891 Unspecified atrial fibrillation: Secondary | ICD-10-CM | POA: Insufficient documentation

## 2016-07-01 DIAGNOSIS — Z79899 Other long term (current) drug therapy: Secondary | ICD-10-CM | POA: Insufficient documentation

## 2016-07-01 MED ORDER — APIXABAN 5 MG PO TABS: 5.0000 mg | ORAL_TABLET | Freq: Two times a day (BID) | ORAL | 3 refills | Status: DC

## 2016-07-01 MED ORDER — ACETAMINOPHEN-CODEINE #4 300-60 MG PO TABS: 1.0000 | ORAL_TABLET | Freq: Three times a day (TID) | ORAL | 2 refills | Status: DC | PRN

## 2016-07-01 MED FILL — ACETAMINOPHEN/COD #4 TABLET: 300-60 | 20 days supply | Qty: 60 | Fill #0

## 2016-07-01 MED FILL — $ELIQUIS 5 MG TABLET: 5 | 30 days supply | Qty: 60 | Fill #0

## 2016-07-01 NOTE — Assessment & Plan Note (Signed)
Wife with trichomonas  Patient denies symptoms Reports negative testing one year ago Urine cytology, RPR, HIV today

## 2016-07-01 NOTE — Patient Instructions (Addendum)
Aneta Minshillip was seen today for exposure to std.  Diagnoses and all orders for this visit:  Trichomonas exposure -     Urine cytology ancillary only -     RPR -     HIV antibody (with reflex)  Midline low back pain without sciatica, unspecified chronicity -     acetaminophen-codeine (TYLENOL #4) 300-60 MG tablet; Take 1 tablet by mouth every 8 (eight) hours as needed for moderate pain.  Other orders -     apixaban (ELIQUIS) 5 MG TABS tablet; Take 1 tablet (5 mg total) by mouth 2 (two) times daily.   You will be called with lab results  F/u in 3 months for HTN   Dr. Armen PickupFunches

## 2016-07-01 NOTE — Assessment & Plan Note (Signed)
Chronic pain, Flare Tylenol #4 refilled

## 2016-07-01 NOTE — Progress Notes (Signed)
Subjective:  Patient ID: Brian Moody, male    DOB: 04/03/1966  Age: 51 y.o. MRN: [id number]  CC: Exposure to STD   HPI Brian Moody A Nishi  Has A  Fib, HTN, smoker he presents for    1. Exposure to trichomonas: his wife was diagnosed with trichomonas vaginalis on screening wet prep two weeks ago. He went to ED on 04/18/2016 for dysuria. His urine culture was negative. Of note, he has moderate to large leukocyte esterase in urine since 03/2014. He was testing one year ago [facility] and everything was negative.   2. Chronic back pain:  Has recent flare of chronic pain one week ago while moving furniture. Pain is worse during the cold months. Tylenol #4 helped with back pain. He request refill.    Social History  Substance Use Topics  . Smoking status: Current Every Day Smoker    Packs/day: 0.20    Types: Cigarettes  . Smokeless tobacco: Not on file  . Alcohol use No   Outpatient Medications Prior to Visit  Medication Sig Dispense Refill  . acetaminophen-codeine (TYLENOL #4) 300-60 MG tablet Take 1 tablet by mouth daily as needed for moderate pain. (Patient not taking: Reported on 05/15/2016) 30 tablet 0  . albuterol (PROVENTIL HFA;VENTOLIN HFA) 108 (90 BASE) MCG/ACT inhaler Inhale 1-2 puffs into the lungs every 6 (six) hours as needed for wheezing. 1 Inhaler 0  . amLODipine (NORVASC) 10 MG tablet Take 1 tablet (10 mg total) by mouth daily. 30 tablet 11  . apixaban (ELIQUIS) 5 MG TABS tablet Take 1 tablet (5 mg total) by mouth 2 (two) times daily. 180 tablet 3  . carvedilol (COREG) 12.5 MG tablet Take 1 tablet (12.5 mg total) by mouth 2 (two) times daily with a meal. 60 tablet 3   No facility-administered medications prior to visit.     ROS Review of Systems  Constitutional: Negative for chills, fatigue, fever and unexpected weight change.  Eyes: Negative for visual disturbance.  Respiratory: Positive for shortness of breath. Negative for cough.   Cardiovascular:  Negative for chest pain, palpitations and leg swelling.  Gastrointestinal: Negative for abdominal pain, blood in stool, constipation, diarrhea, nausea and vomiting.  Endocrine: Negative for polydipsia, polyphagia and polyuria.  Musculoskeletal: Positive for back pain. Negative for arthralgias, gait problem, myalgias and neck pain.  Skin: Negative for rash.  Allergic/Immunologic: Negative for immunocompromised state.  Neurological: Positive for dizziness.  Hematological: Negative for adenopathy. Does not bruise/bleed easily.  Psychiatric/Behavioral: Negative for dysphoric mood, sleep disturbance and suicidal ideas. The patient is not nervous/anxious.     Objective:  BP 118/80 (BP Location: Left Arm, Patient Position: Sitting, Cuff Size: Large)   Pulse 93   Temp 97.8 F (36.6 C) (Oral)   Ht 5\' 10"  (1.778 m)   Wt 245 lb 6.4 oz (111.3 kg)   SpO2 99%   BMI 35.21 kg/m   BP/Weight 07/01/2016 05/15/2016 04/30/2016  Systolic BP 118 142 126  Diastolic BP 80 74 84  Wt. (Lbs) 245.4 251.4 248  BMI 35.21 36.07 35.58   Physical Exam  Constitutional: He appears well-developed and well-nourished. No distress.  HENT:  Head: Normocephalic and atraumatic.  Neck: Normal range of motion. Neck supple.  Cardiovascular: Normal rate, regular rhythm, normal heart sounds and intact distal pulses.   Pulmonary/Chest: Effort normal and breath sounds normal.  Musculoskeletal: He exhibits no edema.       Back:  Neurological: He is alert.  Skin: Skin is  warm and dry. No rash noted. No erythema.  Psychiatric: He has a normal mood and affect.     Assessment & Plan:  Brian Mins was seen today for exposure to std.  Diagnoses and all orders for this visit:  Trichomonas exposure -     Urine cytology ancillary only -     RPR -     HIV antibody (with reflex)  Midline low back pain without sciatica, unspecified chronicity -     acetaminophen-codeine (TYLENOL #4) 300-60 MG tablet; Take 1 tablet by mouth every 8  (eight) hours as needed for moderate pain.  Other orders -     apixaban (ELIQUIS) 5 MG TABS tablet; Take 1 tablet (5 mg total) by mouth 2 (two) times daily.   There are no diagnoses linked to this encounter.  No orders of the defined types were placed in this encounter.   Follow-up: Return in about 3 months (around 09/29/2016) for HTN .   Dessa Phi MD

## 2016-07-02 LAB — RPR

## 2016-07-02 LAB — HIV ANTIBODY (ROUTINE TESTING W REFLEX): HIV: NONREACTIVE

## 2016-07-03 ENCOUNTER — Telehealth: Payer: Self-pay

## 2016-07-03 LAB — URINE CYTOLOGY ANCILLARY ONLY
CHLAMYDIA, DNA PROBE: NEGATIVE
NEISSERIA GONORRHEA: NEGATIVE
TRICH (WINDOWPATH): NEGATIVE

## 2016-07-03 NOTE — Telephone Encounter (Signed)
Pt was called and informed of lab results. 

## 2016-07-04 ENCOUNTER — Telehealth: Payer: Self-pay

## 2016-07-04 NOTE — Telephone Encounter (Addendum)
Patient fully hipaa verified. RN advised patient per Dessa PhiJosalyn Funches, Urine cytology negative for trichomonas, gonorrhea and chlamydia  Patient verbalized understanding.  No additional questions or concerns at this time

## 2016-07-19 ENCOUNTER — Emergency Department (HOSPITAL_COMMUNITY): Payer: Self-pay

## 2016-07-19 ENCOUNTER — Emergency Department (HOSPITAL_COMMUNITY)
Admission: EM | Admit: 2016-07-19 | Discharge: 2016-07-19 | Disposition: A | Discharge status: Home / Self Care | Payer: Self-pay | Attending: Physician Assistant | Admitting: Physician Assistant

## 2016-07-19 ENCOUNTER — Encounter (HOSPITAL_COMMUNITY): Payer: Self-pay | Admitting: *Deleted

## 2016-07-19 DIAGNOSIS — I10 Essential (primary) hypertension: Secondary | ICD-10-CM | POA: Insufficient documentation

## 2016-07-19 DIAGNOSIS — Z7901 Long term (current) use of anticoagulants: Secondary | ICD-10-CM | POA: Insufficient documentation

## 2016-07-19 DIAGNOSIS — F17210 Nicotine dependence, cigarettes, uncomplicated: Secondary | ICD-10-CM | POA: Insufficient documentation

## 2016-07-19 DIAGNOSIS — M546 Pain in thoracic spine: Secondary | ICD-10-CM | POA: Insufficient documentation

## 2016-07-19 MED ORDER — CYCLOBENZAPRINE HCL 10 MG PO TABS
10.0000 mg | ORAL_TABLET | Freq: Once | ORAL | Status: AC
  Administered 2016-07-19: 10 mg via ORAL
  Filled 2016-07-19: qty 1

## 2016-07-19 MED ORDER — CYCLOBENZAPRINE HCL 10 MG PO TABS: 10.0000 mg | ORAL_TABLET | Freq: Two times a day (BID) | ORAL | 0 refills | Status: DC | PRN

## 2016-07-19 MED FILL — ACETAMINOPHEN/COD #4 TABLET: 300-60 | 20 days supply | Qty: 60 | Fill #1

## 2016-07-19 NOTE — Discharge Instructions (Signed)
Make an appointment to follow up with your doctor. Do not drive while taking the muscle relaxant as it will make you sleepy.

## 2016-07-19 NOTE — ED Provider Notes (Signed)
[facility]-EMERGENCY DEPT Provider Note    By signing my name below, I, Brian Moody, attest that this documentation has been prepared under the direction and in the presence of [facility], [place]. Electronically Signed: Earmon Moody, ED Scribe. 07/19/16. 5:09 PM.   History   Chief Complaint Chief Complaint  Patient presents with  . Back Pain    HPI DUWARD ALLBRITTON is an obese 51 y.o. male who presents to the Emergency Department complaining of worsening right-sided mid upper back pain that began two days ago. He reports associated flu-like symptoms last week and believes he may have strained a muscle from coughing. He has not taken anything for pain relief. Moving and lying down increase his pain. Pt denies alleviating factors. He denies pain with breathing, SOB, fever, chills, sore throat, otalgia, abdominal pain, nausea, vomiting, bowel or bladder incontinence. He reports PMHx of atrial fibrillation, HTN.   The history is provided by the patient and medical records. No language interpreter was used.    Past Medical History:  Diagnosis Date  . Hypertension   . Persistent atrial fibrillation (HCC)    a. newly diagnosed on 05/2015 admission. started on coumadin.   . Pre-diabetes    a. HgA1c 6.3 05/2015  . Tobacco abuse     Patient Active Problem List   Diagnosis Date Noted  . Trichomonas exposure 07/01/2016  . Midline low back pain without sciatica 03/29/2016  . Obesity (BMI 30-39.9) 03/29/2016  . Heel pain, bilateral 07/20/2015  . Essential hypertension   . Tobacco abuse   . Persistent atrial fibrillation (HCC)   . Pre-diabetes   . Vitamin D insufficiency 08/24/2013  . Smoking 08/24/2013  . Other and unspecified hyperlipidemia 08/24/2013  . Avulsion fracture of distal fibula 06/02/2013  . Smoker 12/30/2012  . Generalized headaches 12/30/2012    History reviewed. No pertinent surgical history.     Home Medications    Prior to Admission medications     Medication Sig Start Date End Date Taking? Authorizing Provider  acetaminophen-codeine (TYLENOL #4) 300-60 MG tablet Take 1 tablet by mouth every 8 (eight) hours as needed for moderate pain. 07/01/16   Josalyn Funches, MD  albuterol (PROVENTIL HFA;VENTOLIN HFA) 108 (90 BASE) MCG/ACT inhaler Inhale 1-2 puffs into the lungs every 6 (six) hours as needed for wheezing. 07/01/14   Rolland Porter, MD  amLODipine (NORVASC) 10 MG tablet Take 1 tablet (10 mg total) by mouth daily. 03/29/16   Josalyn Funches, MD  apixaban (ELIQUIS) 5 MG TABS tablet Take 1 tablet (5 mg total) by mouth 2 (two) times daily. 07/01/16   Josalyn Funches, MD  carvedilol (COREG) 12.5 MG tablet Take 1 tablet (12.5 mg total) by mouth 2 (two) times daily with a meal. 04/30/16   Newman Nip, NP  cyclobenzaprine (FLEXERIL) 10 MG tablet Take 1 tablet (10 mg total) by mouth 2 (two) times daily as needed for muscle spasms. 07/19/16   Kalyse Meharg Orlene Och, NP    Family History Family History  Problem Relation Age of Onset  . Hypertension Father   . Diabetes Maternal Grandmother   . Hypertension Maternal Grandmother   . Diabetes Maternal Grandfather   . Hypertension Maternal Grandfather     Social History Social History  Substance Use Topics  . Smoking status: Current Every Day Smoker    Packs/day: 0.20    Types: Cigarettes  . Smokeless tobacco: Never Used  . Alcohol use No     Allergies   Patient has no known allergies.  Review of Systems Review of Systems  Constitutional: Negative for chills, diaphoresis, fatigue and fever.  HENT: Negative for congestion, dental problem, ear pain, facial swelling, sinus pressure and sore throat.   Eyes: Negative for photophobia, pain and discharge.  Respiratory: Negative for cough, chest tightness, shortness of breath and wheezing.   Gastrointestinal: Negative for abdominal distention, abdominal pain, constipation, diarrhea, nausea and vomiting.       No bowel or bladder incontinence   Genitourinary: Negative for difficulty urinating, dysuria, flank pain and frequency.  Musculoskeletal: Positive for back pain. Negative for gait problem, myalgias, neck pain and neck stiffness.  Skin: Negative for color change and rash.  Neurological: Negative for dizziness, speech difficulty, weakness, light-headedness, numbness and headaches.  Psychiatric/Behavioral: Negative for agitation and confusion.  All other systems reviewed and are negative.    Physical Exam Updated Vital Signs BP 139/84 (BP Location: Right Arm)   Pulse 96   Temp 97.8 F (36.6 C) (Oral)   Resp 16   Ht 5\' 10"  (1.778 m)   Wt 240 lb (108.9 kg)   SpO2 99%   BMI 34.44 kg/m   Physical Exam  Constitutional: He is oriented to person, place, and time. He appears well-developed and well-nourished. No distress.  HENT:  Head: Normocephalic and atraumatic.  Eyes: EOM are normal. Pupils are equal, round, and reactive to light.  Neck: Normal range of motion. Neck supple.  Cardiovascular: Normal rate and regular rhythm.   Pulmonary/Chest: Effort normal. No respiratory distress. He has no wheezes. He has no rales.  Abdominal: Soft. Bowel sounds are normal. There is no tenderness.  Musculoskeletal: Normal range of motion. He exhibits tenderness. He exhibits no edema or deformity.       Thoracic back: He exhibits tenderness and spasm. He exhibits normal pulse.  Tenderness to palpation over right thoracic area.  Neurological: He is alert and oriented to person, place, and time. He has normal strength. No cranial nerve deficit. Gait normal.  Reflex Scores:      Patellar reflexes are 2+ on the left side. Skin: Skin is warm and dry.  Psychiatric: He has a normal mood and affect. His behavior is normal.  Nursing note and vitals reviewed.    ED Treatments / Results  DIAGNOSTIC STUDIES: Oxygen Saturation is 99% on RA, normal by my interpretation.   COORDINATION OF CARE: 4:21 PM- Will order imaging an muscle relaxer  prior to imaging. Pt verbalizes understanding and agrees to plan.  Medications  cyclobenzaprine (FLEXERIL) tablet 10 mg (10 mg Oral Given 07/19/16 1700)    Labs (all labs ordered are listed, but only abnormal results are displayed) Labs Reviewed - No data to display  Radiology Dg Chest 2 View  Result Date: 07/19/2016 CLINICAL DATA:  Reason for exam: cough and pain. Pt had the flu last week; pt no longer has a fever; some SOB; CP on right side radiating to lower back. Medical hx: hypertension, atrial fibrillation, tobacco abuse, pre-diabetes. EXAM: CHEST  2 VIEW COMPARISON:  06/22/2015 FINDINGS: Mild blunting of both costophrenic angles, more notable on the right than the left. Heart size within normal limits. Mild density along one of the major fissures on the lateral projection, probably from a small amount of fluid in the fissure, versus adjacent mild atelectasis. Thoracic spondylosis. IMPRESSION: 1. Trace bilateral pleural effusions, more notable on the right than the left. Electronically Signed   By: Gaylyn Rong M.D.   On: 07/19/2016 16:55    Procedures Procedures (including critical care  time)  Medications Ordered in ED Medications  cyclobenzaprine (FLEXERIL) tablet 10 mg (10 mg Oral Given 07/19/16 1700)     Initial Impression / Assessment and Plan / ED Course  I have reviewed the triage vital signs and the nursing notes.  Pertinent imaging results that were available during my care of the patient were reviewed by me and considered in my medical decision making (see chart for details).    51 y.o. male with right side back pain s/p cough with influenza. Will treat for muscle strain. Return precaution discussed. Appears safe for discharge at this time. Follow up as indicated in discharge paperwork.  I personally performed the services described in this documentation, which was scribed in my presence. The recorded information has been reviewed and is accurate.   Final Clinical  Impressions(s) / ED Diagnoses   Final diagnoses:  Acute right-sided thoracic back pain    New Prescriptions New Prescriptions   CYCLOBENZAPRINE (FLEXERIL) 10 MG TABLET    Take 1 tablet (10 mg total) by mouth 2 (two) times daily as needed for muscle spasms.     OrrHope M Keivon Garden, NP 07/19/16 1717    Courteney Randall AnLyn Mackuen, MD 07/19/16 [phone]

## 2016-07-19 NOTE — ED Triage Notes (Signed)
Pt in c/o mid upper back pain post recovering from the flu this wk, pt reports feeling like he pulled a muscle from coughing, pt MAE, pt ambulatory, denies bowel & bladder incontinence, pt A&O x4

## 2016-07-25 MED FILL — CYCLOBENZAPRINE 10 MG TAB: 10 | 10 days supply | Qty: 20 | Fill #0

## 2016-08-08 MED FILL — AMLODIPINE BESYLATE 10 MG T: 10 | 30 days supply | Qty: 30 | Fill #2

## 2016-08-14 ENCOUNTER — Other Ambulatory Visit: Payer: Self-pay | Admitting: Physician Assistant

## 2016-08-14 MED FILL — ACETAMINOPHEN/COD #4 TABLET: 300-60 | 20 days supply | Qty: 60 | Fill #2

## 2016-08-15 ENCOUNTER — Ambulatory Visit (HOSPITAL_COMMUNITY)
Admission: RE | Admit: 2016-08-15 | Discharge: 2016-08-15 | Disposition: A | Discharge status: Home / Self Care | Payer: Self-pay | Source: Ambulatory Visit | Attending: Nurse Practitioner | Admitting: Nurse Practitioner

## 2016-08-15 ENCOUNTER — Encounter (HOSPITAL_COMMUNITY): Payer: Self-pay | Admitting: Nurse Practitioner

## 2016-08-15 VITALS — BP 126/94 | HR 135 | Ht 70.0 in | Wt 249.2 lb

## 2016-08-15 DIAGNOSIS — Z7901 Long term (current) use of anticoagulants: Secondary | ICD-10-CM | POA: Insufficient documentation

## 2016-08-15 DIAGNOSIS — Z720 Tobacco use: Secondary | ICD-10-CM | POA: Insufficient documentation

## 2016-08-15 DIAGNOSIS — I10 Essential (primary) hypertension: Secondary | ICD-10-CM

## 2016-08-15 DIAGNOSIS — I481 Persistent atrial fibrillation: Secondary | ICD-10-CM | POA: Insufficient documentation

## 2016-08-15 DIAGNOSIS — F17210 Nicotine dependence, cigarettes, uncomplicated: Secondary | ICD-10-CM | POA: Insufficient documentation

## 2016-08-15 DIAGNOSIS — I4819 Other persistent atrial fibrillation: Secondary | ICD-10-CM

## 2016-08-15 MED ORDER — CARVEDILOL 12.5 MG PO TABS: 12.5000 mg | ORAL_TABLET | Freq: Two times a day (BID) | ORAL | 6 refills | Status: DC

## 2016-08-15 MED FILL — $ELIQUIS 5 MG TABLET: 5 | 30 days supply | Qty: 60 | Fill #1

## 2016-08-15 MED FILL — ?CARVEDILOL 12.5 MG TABLET: 12.5 | 30 days supply | Qty: 60 | Fill #0

## 2016-08-15 NOTE — Progress Notes (Signed)
Patient ID: Felicita Gagehillip A Lehew, male   DOB: 04/15/1966, 51 y.o.   MRN: [id number]     Primary Care Physician: Lora PaulaFUNCHES, JOSALYN C, MD Referring Physician: [facility]   Felicita Gagehillip A Abshier is a 51 y.o. male with a h/o tobacco abuse and HTN who [facility] with for new-onset dyspnea for the past few months found to be in atrial fibrillation w/ RVR.  He has a long history of HTN and has been taking BP meds for 3 years. He had been out of his meds for several months. He was recently restarted on his BP meds (Lisinopril HCTZ 20-12.5 a day about 2 weeks ago by [facility] and since that time he did not feel well ( frequently thirsty, weak, light headed). He had some lightheadedness, diaphoresis and right sided chest pain at work and [facility] where he was found to be in afib with RVR and AKI felt to be due to dehydration.   He was started on coumadin but then switched to xarelto for chadsvasc score of 1. He was rate controlled with plans for outpatient cardioversion once adequately anticoagulated. He does continue to smoke. Drinks a lot of caffeine, no alcohol, no regular exercise, obese. Admits to snoring and waking up choking and gasping for air at times. Daytime somnolence. 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 He states that he has taken xarelto on a regular basis without missed does and importance of no missed doses emphasized. He is rate controlled today with controlled blood pressure.  He returned to the ER for facial swelling thought possibly related to xarelto, was told to take benadryl, stayed on xarelto and swelling resolved.  On last visit in the afib clinic, he was set up for cardioversion, but he called and cancelled cardioversion because he felt better, thought he had returned to SR and the fact that his wife just had knee surgery and he had to be at home to take care of  her.  He returns today 1/26, and continues in afib. 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 He is willing to be set up for cardioversion in a few weeks after his wife has recovered from knee surgery. He is taking xarelto on a regular basis and is aware not to miss doses.  F/u in afib clinic 11/7. Pt never went thru with cardioversion and am now seeing back after not seeing pt since early 2017.  He is here to see if he needs to continue DOAC. He believes it is making him have back pain. He is living in afib this past year but today he has RVR in the 120's. He states that he is asymptomatic. I question if he may have developed TMC if he has had afib with rvr for the last 10+ months.   F/u in afib clinic, echo reviewed with pt and with normal structure. Being in chronic afib has not effected EF. I discussed trying to restore SR but he is not in favor of a cardioversion which is likely to be needed if AAD therapy is use. I doubt pt would be compliant with AAD because this is the second visit that he said he forgot to take his am meds prior to coming to clinic. His back  feels better on  eliquis but he also bought a new mattress. He will stay on this drug. He continues to  smoke heavily.  F/u in afib clinic 2/23. He is in afib with RVR because he ran out of BB one week ago and thought he would wait and get refills with this visit.He is almost out of eliquis but is waiting on [facility] to get his paper work ready for pt assistance for this drug. He still reports that he does not feel bad in afib,not aware of irregular heart bet.  Today, he denies symptoms of palpitations, chest pain, shortness of breath, orthopnea, PND, lower extremity edema, dizziness, presyncope, syncope, or neurologic sequela. C/o back pain. The patient is tolerating medications without difficulties and is otherwise without  complaint today.   Past Medical History:  Diagnosis Date  . Hypertension   . Persistent atrial fibrillation (HCC)    a. newly diagnosed on 05/2015 admission. started on coumadin.   . Pre-diabetes    a. HgA1c 6.3 05/2015  . Tobacco abuse    No past surgical history on file.  Current Outpatient Prescriptions  Medication Sig Dispense Refill  . acetaminophen-codeine (TYLENOL #4) 300-60 MG tablet Take 1 tablet by mouth every 8 (eight) hours as needed for moderate pain. 60 tablet 2  . albuterol (PROVENTIL HFA;VENTOLIN HFA) 108 (90 BASE) MCG/ACT inhaler Inhale 1-2 puffs into the lungs every 6 (six) hours as needed for wheezing. 1 Inhaler 0  . amLODipine (NORVASC) 10 MG tablet Take 1 tablet (10 mg total) by mouth daily. 30 tablet 11  . apixaban (ELIQUIS) 5 MG TABS tablet Take 1 tablet (5 mg total) by mouth 2 (two) times daily. 60 tablet 3  . cyclobenzaprine (FLEXERIL) 10 MG tablet Take 1 tablet (10 mg total) by mouth 2 (two) times daily as needed for muscle spasms. 20 tablet 0  . carvedilol (COREG) 12.5 MG tablet Take 1 tablet (12.5 mg total) by mouth 2 (two) times daily with a meal. 60 tablet 6   No current facility-administered medications for this encounter.     No Known Allergies  Social History   Social History  . Marital status: Married    Spouse name: N/A  . Number of children: N/A  . Years of education: N/A   Occupational History  . Not on file.   Social History Main Topics  . Smoking status: Current Every Day Smoker    Packs/day: 0.20    Types: Cigarettes  . Smokeless tobacco: Never Used  . Alcohol use No  . Drug use: No  . Sexual activity: Not on file   Other Topics Concern  . Not on file   Social History Narrative  . No narrative on file    Family History  Problem Relation Age of Onset  . Hypertension Father   . Diabetes Maternal Grandmother   . Hypertension Maternal Grandmother   . Diabetes Maternal Grandfather   . Hypertension Maternal Grandfather      ROS- All systems are reviewed and negative except as per the HPI above  Physical Exam: Vitals:   08/15/16 1422  BP: (!) 126/94  Pulse: (!) 135  Weight: 249 lb 3.2 oz (113 kg)  Height: 5\' 10"  (1.778 m)    GEN- The patient is well appearing, alert and oriented x 3 today.   Head- normocephalic, atraumatic Eyes-  Sclera clear, conjunctiva pink Ears- hearing intact Oropharynx- clear Neck- supple, no JVP Lymph- no cervical lymphadenopathy Lungs- Clear to ausculation bilaterally, normal  work of breathing Heart- Irregular rate and rhythm, no murmurs, rubs or gallops, PMI not laterally displaced GI- soft, NT, ND, + BS Extremities- no clubbing, cyanosis, or edema MS- no significant deformity or atrophy Skin- no rash or lesion Psych- euthymic mood, full affect Neuro- strength and sensation are intact  EKG- Afib with v rate of 135 bpm, qrs int 76 ms, qt int 471 ms Epic records reviewed Myoview stress test 06/24/15-1. No reversible ischemia or infarction.  2. Moderate global hypokinesis.  3. Left ventricular ejection fraction 41%  4. Intermediate-risk stress test findings*.  Echo-11/17-Study Conclusions  - Left ventricle: The cavity size was normal. Wall thickness was   normal. Systolic function was normal. The estimated ejection   fraction was in the range of 55% to 60%.   Assessment and Plan:  1.Persistent afib Pt cancelled cardioversion  in January 2017 and did not reschedule in afib clinic Returned to clinic in November 2017 Rx for carvedilol 12.5 mg bid given and asked not to run out of drug. Continue eliquis 5 mg bid  He has a chadsvasc score of  1 to soft 3(HTN,  LV dysfunction on previous echo, pre diabetic at one time), I would like him to continue drug to minimize risk of stroke He does not wish to purse getting back in SR at this point and I question if he would be compliant with drug, since he cant remember to take meds before he comes to clinic     2.Lifestyle issues Encouraged to lose weight and establish  regular exercise Encouraged tobacco cessation Decrease caffeine intake. Snoring, sleep study was scheduled but pt did not follow through with test  3. HTN Controlled  F/u in afib clinic  in 6 months   Lupita Leash C. Matthew Folks [facility] [address] [place], [place] [address] [phone]

## 2016-09-26 MED FILL — $ELIQUIS 5 MG TABLET: 5 | 30 days supply | Qty: 60 | Fill #2

## 2016-09-26 MED FILL — AMLODIPINE BESYLATE 10 MG T: 10 | 30 days supply | Qty: 30 | Fill #3

## 2016-09-26 MED FILL — CARVEDILOL 12.5 MG TABLET: 12.5 | 30 days supply | Qty: 60 | Fill #1

## 2016-10-17 ENCOUNTER — Encounter: Payer: Self-pay | Admitting: Family Medicine

## 2016-10-17 ENCOUNTER — Ambulatory Visit: Payer: Self-pay | Attending: Family Medicine | Admitting: Family Medicine

## 2016-10-17 VITALS — BP 123/76 | HR 84 | Temp 98.0°F | Ht 70.0 in | Wt 247.0 lb

## 2016-10-17 DIAGNOSIS — M25551 Pain in right hip: Secondary | ICD-10-CM | POA: Insufficient documentation

## 2016-10-17 DIAGNOSIS — I10 Essential (primary) hypertension: Secondary | ICD-10-CM | POA: Insufficient documentation

## 2016-10-17 DIAGNOSIS — M25512 Pain in left shoulder: Secondary | ICD-10-CM | POA: Insufficient documentation

## 2016-10-17 DIAGNOSIS — M545 Low back pain, unspecified: Secondary | ICD-10-CM

## 2016-10-17 DIAGNOSIS — M25571 Pain in right ankle and joints of right foot: Secondary | ICD-10-CM | POA: Insufficient documentation

## 2016-10-17 DIAGNOSIS — I4819 Other persistent atrial fibrillation: Secondary | ICD-10-CM

## 2016-10-17 DIAGNOSIS — I481 Persistent atrial fibrillation: Secondary | ICD-10-CM

## 2016-10-17 DIAGNOSIS — F17210 Nicotine dependence, cigarettes, uncomplicated: Secondary | ICD-10-CM | POA: Insufficient documentation

## 2016-10-17 DIAGNOSIS — R200 Anesthesia of skin: Secondary | ICD-10-CM | POA: Insufficient documentation

## 2016-10-17 MED ORDER — ACETAMINOPHEN-CODEINE #4 300-60 MG PO TABS: 1.0000 | ORAL_TABLET | Freq: Three times a day (TID) | ORAL | 2 refills | Status: DC | PRN

## 2016-10-17 MED ORDER — CYCLOBENZAPRINE HCL 10 MG PO TABS: 10.0000 mg | ORAL_TABLET | Freq: Every day | ORAL | 3 refills | Status: DC

## 2016-10-17 MED FILL — ACETAMINOPHEN/COD #4 TABLET: 300-60 | 20 days supply | Qty: 60 | Fill #0

## 2016-10-17 MED FILL — CYCLOBENZAPRINE 10 MG TAB: 10 | 30 days supply | Qty: 30 | Fill #0

## 2016-10-17 NOTE — Progress Notes (Signed)
Subjective:  Patient ID: Brian Moody, male    DOB: 02/21/1966  Age: 51 y.o. MRN: [id number]  CC: Back Pain   HPI Brian Moody  Has A  Fib, HTN, smoker he presents for   1. Joint pain: he has chronic  Low back pain. He also has L shoulder, R hip pain and R ankle pain. He is a smoker. He smokes 3-4 cigs per day.  Current level of pain in L shoulder- 7/10 Low back- 7/10. With L leg numbness.  R hip- 7/10 R ankle- 7/10. With swelling at times.  He has history of avulsion fracture in R ankle.   He reports his symptoms improve when he does not take elquis.   Social History  Substance Use Topics  . Smoking status: Current Every Day Smoker    Packs/day: 0.20    Types: Cigarettes  . Smokeless tobacco: Never Used  . Alcohol use No   Outpatient Medications Prior to Visit  Medication Sig Dispense Refill  . acetaminophen-codeine (TYLENOL #4) 300-60 MG tablet Take 1 tablet by mouth every 8 (eight) hours as needed for moderate pain. 60 tablet 2  . albuterol (PROVENTIL HFA;VENTOLIN HFA) 108 (90 BASE) MCG/ACT inhaler Inhale 1-2 puffs into the lungs every 6 (six) hours as needed for wheezing. 1 Inhaler 0  . amLODipine (NORVASC) 10 MG tablet Take 1 tablet (10 mg total) by mouth daily. 30 tablet 11  . apixaban (ELIQUIS) 5 MG TABS tablet Take 1 tablet (5 mg total) by mouth 2 (two) times daily. 60 tablet 3  . carvedilol (COREG) 12.5 MG tablet Take 1 tablet (12.5 mg total) by mouth 2 (two) times daily with a meal. 60 tablet 6  . cyclobenzaprine (FLEXERIL) 10 MG tablet Take 1 tablet (10 mg total) by mouth 2 (two) times daily as needed for muscle spasms. 20 tablet 0   No facility-administered medications prior to visit.     ROS Review of Systems  Constitutional: Negative for chills, fatigue, fever and unexpected weight change.  Eyes: Negative for visual disturbance.  Respiratory: Negative for cough and shortness of breath.   Cardiovascular: Negative for chest pain, palpitations and leg  swelling.  Gastrointestinal: Negative for abdominal pain, blood in stool, constipation, diarrhea, nausea and vomiting.  Endocrine: Negative for polydipsia, polyphagia and polyuria.  Musculoskeletal: Positive for arthralgias and back pain. Negative for gait problem, myalgias and neck pain.  Skin: Negative for rash.  Allergic/Immunologic: Negative for immunocompromised state.  Neurological: Negative for dizziness.  Hematological: Negative for adenopathy. Does not bruise/bleed easily.  Psychiatric/Behavioral: Negative for dysphoric mood, sleep disturbance and suicidal ideas. The patient is not nervous/anxious.     Objective:  BP 123/76   Pulse 84   Temp 98 F (36.7 C) (Oral)   Ht  (1.778 m)   Wt 247 lb (112 kg)   SpO2 98%   BMI 35.44 kg/m   BP/Weight 10/17/2016 08/15/2016 07/19/2016  Systolic BP 123 126 139  Diastolic BP 76 94 84  Wt. (Lbs) 247 249.2 240  BMI 35.44 35.76 34.44   Physical Exam  Constitutional: He appears well-developed and well-nourished. No distress.  HENT:  Head: Normocephalic and atraumatic.  Neck: Normal range of motion. Neck supple.  Cardiovascular: Normal rate, regular rhythm, normal heart sounds and intact distal pulses.   Pulmonary/Chest: Effort normal and breath sounds normal.  Musculoskeletal: He exhibits no edema.       Back:  Neurological: He is alert.  Skin: Skin is warm and dry.  No rash noted. No erythema.  Psychiatric: He has a normal mood and affect.     Assessment & Plan:  [name] was seen today for joint pain.  Diagnoses and all orders for this visit:  Persistent atrial fibrillation (HCC)  Midline low back pain without sciatica, unspecified chronicity -     acetaminophen-codeine (TYLENOL #4) 300-60 MG tablet; Take 1 tablet by mouth every 8 (eight) hours as needed for moderate pain. -     cyclobenzaprine (FLEXERIL) 10 MG tablet; Take 1 tablet (10 mg total) by mouth at bedtime.   There are no diagnoses linked to this  encounter.  No orders of the defined types were placed in this encounter.   Follow-up: Return in about 3 months (around 01/16/2017) for HTN.   Dessa Phi MD

## 2016-10-17 NOTE — Progress Notes (Signed)
Pt is having back, left shoulder, and right hip pain.

## 2016-10-17 NOTE — Patient Instructions (Addendum)
Brian Moody was seen today for joint pain.  Diagnoses and all orders for this visit:  Persistent atrial fibrillation (HCC)  Midline low back pain without sciatica, unspecified chronicity -     acetaminophen-codeine (TYLENOL #4) 300-60 MG tablet; Take 1 tablet by mouth every 8 (eight) hours as needed for moderate pain. -     cyclobenzaprine (FLEXERIL) 10 MG tablet; Take 1 tablet (10 mg total) by mouth at bedtime.   Smoking cessation support: smoking cessation hotline: 1-800-QUIT-NOW.  Smoking cessation classes are available through [facility]. Call [phone] or visit our website at [email or web address].   Move up your cardiology appointment and I do recommend the cardioversion since your are not tolerating the eliquis well.  Plan to get Tdap and colonoscopy this year  f/u in  3 months for joint pains and HTN, sooner if needed  Dr. Armen Pickup

## 2016-10-22 NOTE — Assessment & Plan Note (Signed)
Chronic pain Refilled tylenol #4 and flexeril

## 2016-11-06 ENCOUNTER — Encounter: Payer: Self-pay | Admitting: Family Medicine

## 2016-11-25 MED FILL — CARVEDILOL 12.5 MG TABLET: 12.5 | 30 days supply | Qty: 60 | Fill #2

## 2016-11-25 MED FILL — $ELIQUIS 5 MG TABLET: 5 | 30 days supply | Qty: 60 | Fill #3

## 2016-11-25 MED FILL — AMLODIPINE BESYLATE 10 MG T: 10 | 30 days supply | Qty: 30 | Fill #4

## 2016-12-20 IMAGING — DX DG CHEST 2V
2 series · 2 of 2 positions shown · non-contrast
Comparison: Chest x-ray 12/30/2012.

CLINICAL DATA: 48-year-old male with 2 week history of cough and 2
day history of mid chest pain. Fevers and chills since yesterday
evening.

EXAM:
CHEST  2 VIEW

[chest pa]
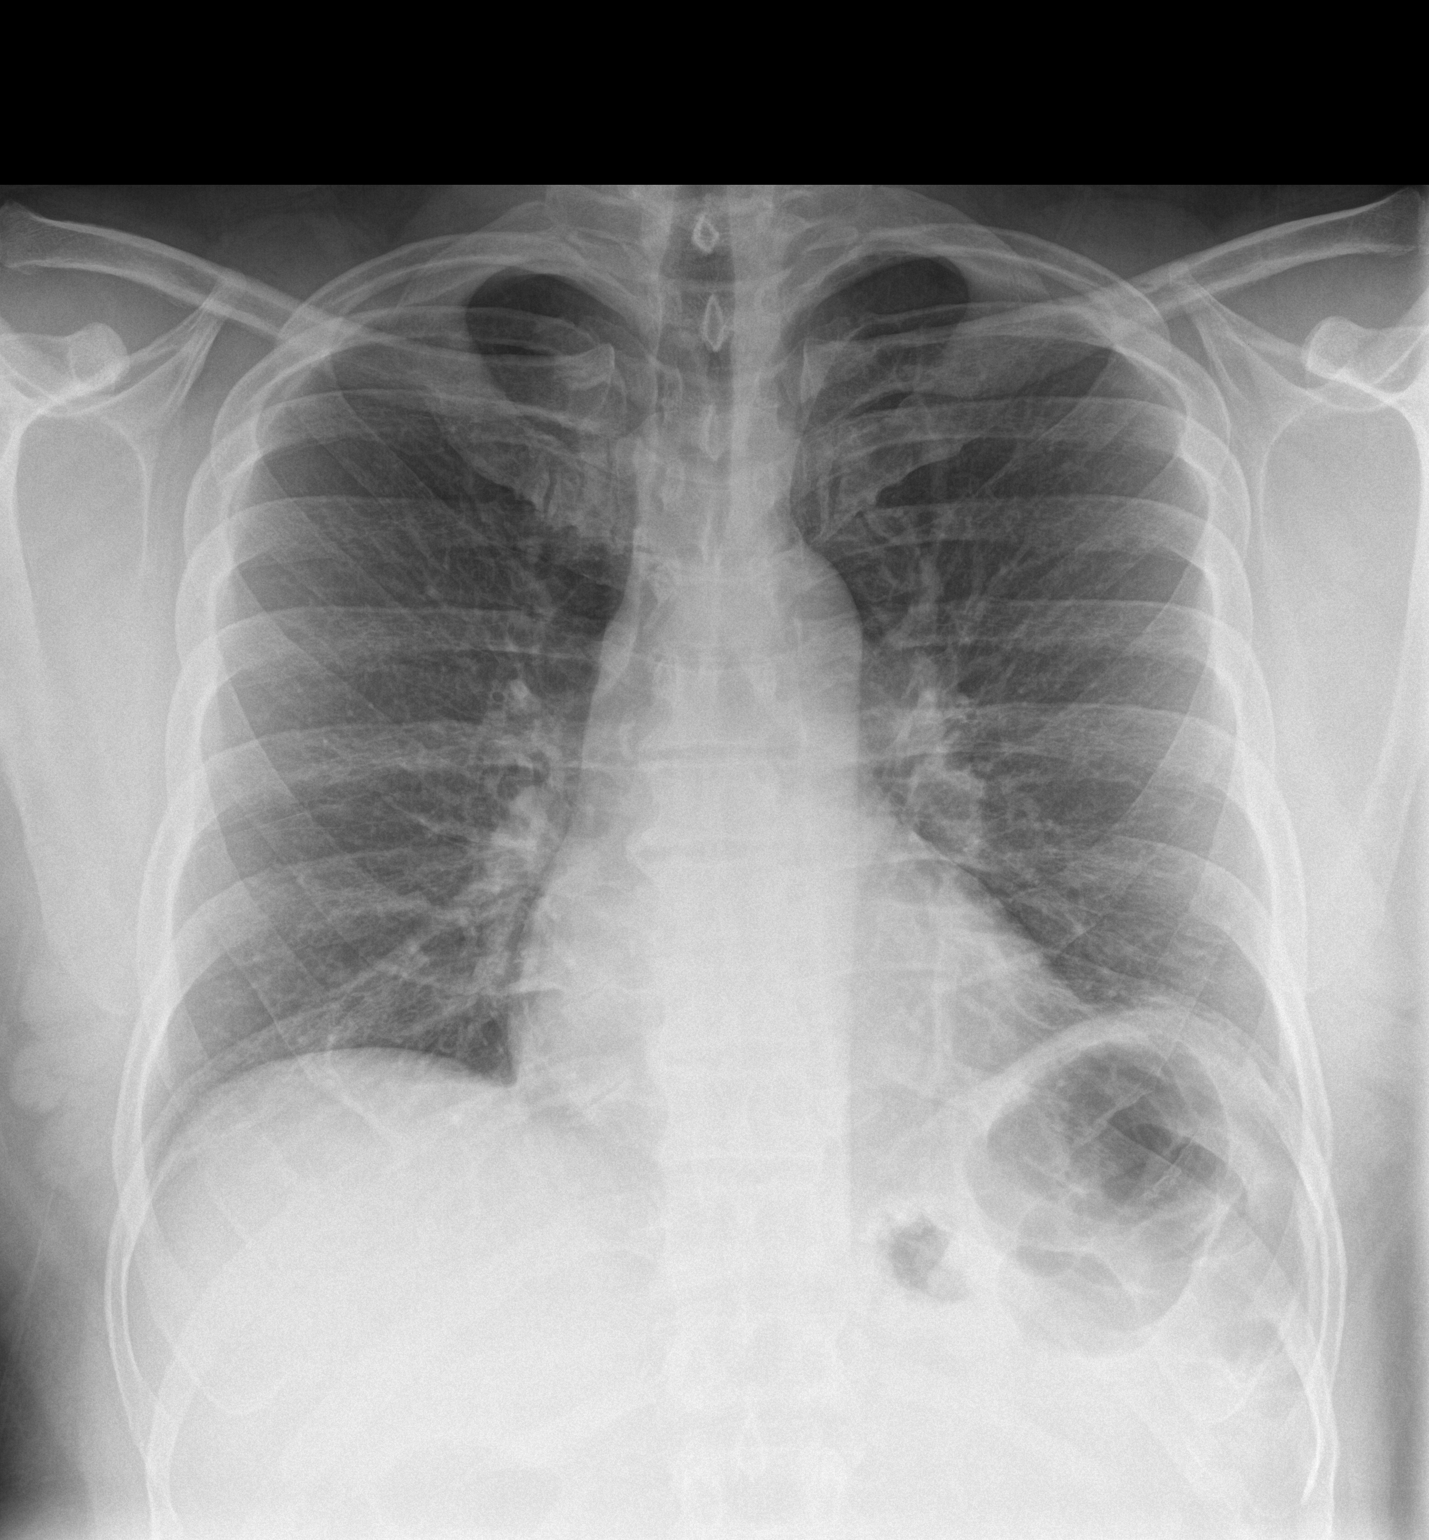

[chest lat]
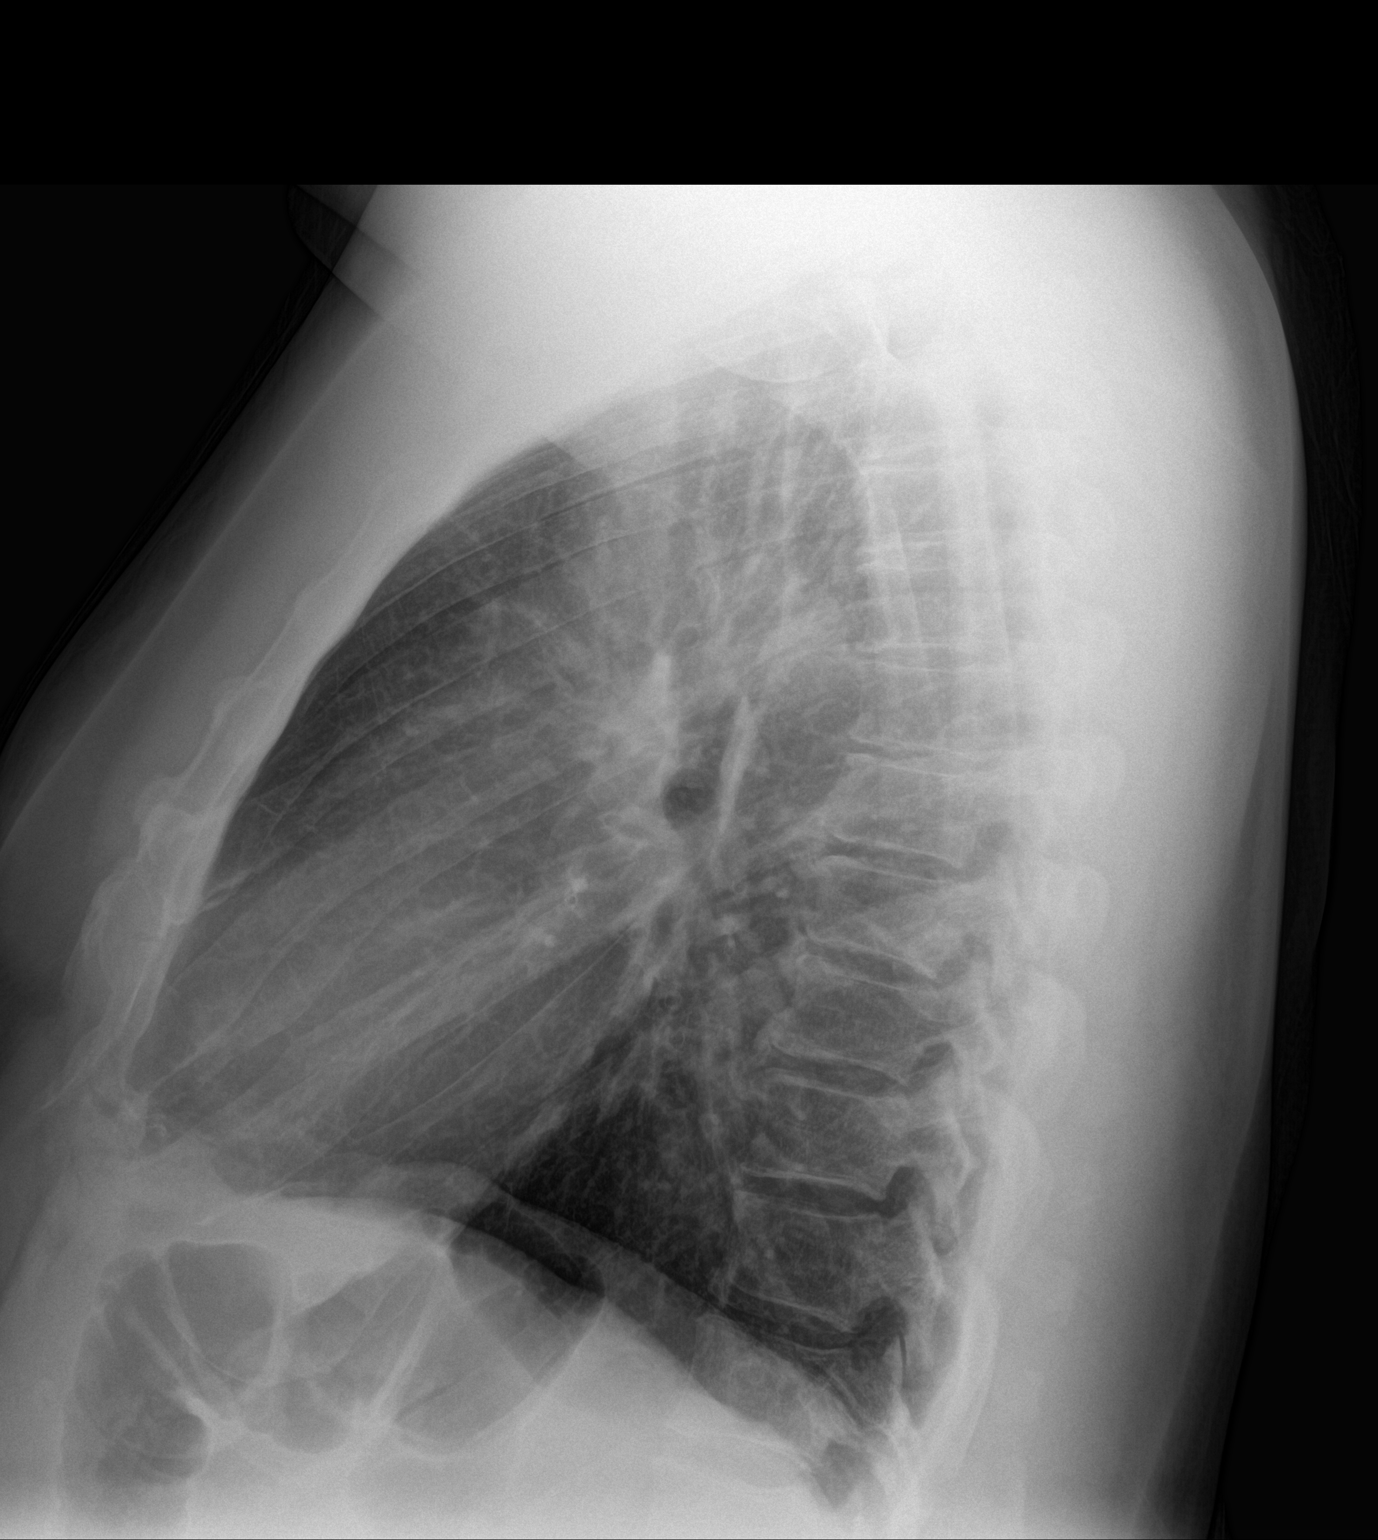

[2 of 2 positions shown; findings below may reference images not displayed]

FINDINGS: Diffuse peribronchial cuffing. Lung volumes are normal. No
consolidative airspace disease. No pleural effusions. No
pneumothorax. No pulmonary nodule or mass noted. Pulmonary
vasculature and the cardiomediastinal silhouette are within normal
limits.
IMPRESSION: 1. Diffuse peribronchial cuffing, suggestive of acute bronchitis.

## 2017-01-23 ENCOUNTER — Ambulatory Visit: Payer: Medicaid Other | Admitting: Family Medicine

## 2017-01-27 MED FILL — CARVEDILOL 12.5 MG TABLET: 12.5 | 30 days supply | Qty: 60 | Fill #3

## 2017-01-27 MED FILL — AMLODIPINE BESYLATE 10 MG T: 10 | 30 days supply | Qty: 30 | Fill #5

## 2017-01-27 MED FILL — $ELIQUIS 5 MG TABLET: 5 | 30 days supply | Qty: 60 | Fill #2

## 2017-05-07 MED FILL — $ELIQUIS 5 MG TABLET: 5 | 30 days supply | Qty: 60 | Fill #3

## 2017-05-12 MED FILL — ?CARVEDILOL 12.5 MG TABLET: 12.5 | 30 days supply | Qty: 60 | Fill #4

## 2017-06-13 ENCOUNTER — Ambulatory Visit: Payer: Self-pay | Admitting: Internal Medicine

## 2017-07-10 ENCOUNTER — Other Ambulatory Visit (HOSPITAL_COMMUNITY): Payer: Self-pay | Admitting: Nurse Practitioner

## 2017-07-21 ENCOUNTER — Ambulatory Visit: Payer: Medicare Other | Attending: Internal Medicine | Admitting: Internal Medicine

## 2017-07-21 ENCOUNTER — Encounter: Payer: Self-pay | Admitting: Internal Medicine

## 2017-07-21 VITALS — BP 175/100 | HR 96 | Temp 98.0°F | Resp 16 | Ht 71.0 in | Wt 247.8 lb

## 2017-07-21 DIAGNOSIS — M545 Low back pain: Secondary | ICD-10-CM | POA: Diagnosis not present

## 2017-07-21 DIAGNOSIS — I10 Essential (primary) hypertension: Secondary | ICD-10-CM | POA: Diagnosis not present

## 2017-07-21 DIAGNOSIS — E669 Obesity, unspecified: Secondary | ICD-10-CM | POA: Insufficient documentation

## 2017-07-21 DIAGNOSIS — R7303 Prediabetes: Secondary | ICD-10-CM | POA: Insufficient documentation

## 2017-07-21 DIAGNOSIS — Z833 Family history of diabetes mellitus: Secondary | ICD-10-CM | POA: Insufficient documentation

## 2017-07-21 DIAGNOSIS — M62830 Muscle spasm of back: Secondary | ICD-10-CM | POA: Insufficient documentation

## 2017-07-21 DIAGNOSIS — I4819 Other persistent atrial fibrillation: Secondary | ICD-10-CM

## 2017-07-21 DIAGNOSIS — F17210 Nicotine dependence, cigarettes, uncomplicated: Secondary | ICD-10-CM | POA: Insufficient documentation

## 2017-07-21 DIAGNOSIS — Z6834 Body mass index (BMI) 34.0-34.9, adult: Secondary | ICD-10-CM | POA: Diagnosis not present

## 2017-07-21 DIAGNOSIS — G8929 Other chronic pain: Secondary | ICD-10-CM | POA: Diagnosis not present

## 2017-07-21 DIAGNOSIS — F17200 Nicotine dependence, unspecified, uncomplicated: Secondary | ICD-10-CM | POA: Diagnosis not present

## 2017-07-21 DIAGNOSIS — Z79899 Other long term (current) drug therapy: Secondary | ICD-10-CM | POA: Diagnosis not present

## 2017-07-21 DIAGNOSIS — E559 Vitamin D deficiency, unspecified: Secondary | ICD-10-CM | POA: Insufficient documentation

## 2017-07-21 DIAGNOSIS — Z8249 Family history of ischemic heart disease and other diseases of the circulatory system: Secondary | ICD-10-CM | POA: Diagnosis not present

## 2017-07-21 DIAGNOSIS — Z2821 Immunization not carried out because of patient refusal: Secondary | ICD-10-CM | POA: Diagnosis not present

## 2017-07-21 DIAGNOSIS — I481 Persistent atrial fibrillation: Secondary | ICD-10-CM | POA: Insufficient documentation

## 2017-07-21 DIAGNOSIS — E785 Hyperlipidemia, unspecified: Secondary | ICD-10-CM | POA: Diagnosis not present

## 2017-07-21 MED ORDER — CYCLOBENZAPRINE HCL 10 MG PO TABS: 10.0000 mg | ORAL_TABLET | Freq: Every day | ORAL | 3 refills | Status: DC

## 2017-07-21 MED ORDER — NICOTINE 7 MG/24HR TD PT24: 7.0000 mg | MEDICATED_PATCH | Freq: Every day | TRANSDERMAL | 0 refills | Status: DC

## 2017-07-21 MED ORDER — NICOTINE 14 MG/24HR TD PT24: 14.0000 mg | MEDICATED_PATCH | Freq: Every day | TRANSDERMAL | 0 refills | Status: DC

## 2017-07-21 MED ORDER — CARVEDILOL 12.5 MG PO TABS: 12.5000 mg | ORAL_TABLET | Freq: Two times a day (BID) | ORAL | 11 refills | Status: DC

## 2017-07-21 MED ORDER — AMLODIPINE BESYLATE 10 MG PO TABS: 10.0000 mg | ORAL_TABLET | Freq: Every day | ORAL | 11 refills | Status: DC

## 2017-07-21 MED ORDER — APIXABAN 5 MG PO TABS: 5.0000 mg | ORAL_TABLET | Freq: Two times a day (BID) | ORAL | 5 refills | Status: DC

## 2017-07-21 MED FILL — CYCLOBENZAPRINE 10 MG TAB: 10 | 30 days supply | Qty: 30 | Fill #0

## 2017-07-21 MED FILL — ?CARVEDILOL 12.5 MG TABLET: 12.5 | 30 days supply | Qty: 60 | Fill #0

## 2017-07-21 MED FILL — AMLODIPINE BESYLATE 10 MG T: 10 | 30 days supply | Qty: 30 | Fill #0

## 2017-07-21 MED FILL — $ELIQUIS 5 MG TABLET: 5 | 30 days supply | Qty: 60 | Fill #0

## 2017-07-21 NOTE — Patient Instructions (Signed)
Start Nicotine patches 14 mg daily for one month, then step down to the 7 mg patches for 1 month.  Try taking the Amlodipine 1 hour after the Eliquis and Carvedilol.

## 2017-07-21 NOTE — Progress Notes (Signed)
Patient ID: Brian Moody, male    DOB: [birth year]  MRN: [id number]  CC: re-establish; Medication Refill; Hypertension; and Back Pain   Subjective: Brian Moody is a 52 y.o. male who presents for chronic ds managment His concerns today include:  Pt with hx of A.fib, HTN, tob dep, chronic LBP, LT shoulder OA and RT hip pain  1.  A.fib/HTN: out of Eliquis and Coreg x 1 wk.  Norvasc makes him feel weak when he takes it with Coreg and Eliquis.  [facility]; last seen about 1 year ago -endorses palpitations sometimes in a.m lasting several seconds -+ SOB when he walks around the block. Has to stop after 1.5 block -no CP.  No LE edema  2.  Tob dep:  Smokes 3-4/day. Smoked since age 1.  Wants to quit but afraid of wgh gain.  3.  C/o RT sided upper back pain/spasms for a while. Started after he was placed on Eliquis -intermittent.  Last 1-2 days.  If he works outside in his yard, his back hurts for few days.  No numbness or tingling down arms or legs -also gets some swelling in RT ankle which he attributes to previous fracture.  HM: due for influenza and Tdap. Has colonoscopy scheduled  Patient Active Problem List   Diagnosis Date Noted  . Midline low back pain without sciatica 03/29/2016  . Obesity (BMI 30-39.9) 03/29/2016  . Heel pain, bilateral 07/20/2015  . Essential hypertension   . Tobacco abuse   . Persistent atrial fibrillation (HCC)   . Pre-diabetes   . Vitamin D insufficiency 08/24/2013  . Smoking 08/24/2013  . Other and unspecified hyperlipidemia 08/24/2013  . Avulsion fracture of distal fibula 06/02/2013  . Smoker 12/30/2012  . Generalized headaches 12/30/2012     Current Outpatient Medications on File Prior to Visit  Medication Sig Dispense Refill  . albuterol (PROVENTIL HFA;VENTOLIN HFA) 108 (90 BASE) MCG/ACT inhaler Inhale 1-2 puffs into the lungs every 6 (six) hours as needed for wheezing. 1 Inhaler 0   No current  facility-administered medications on file prior to visit.     No Known Allergies  Social History   Socioeconomic History  . Marital status: Married    Spouse name: Not on file  . Number of children: Not on file  . Years of education: Not on file  . Highest education level: Not on file  Social Needs  . Financial resource strain: Not on file  . Food insecurity - worry: Not on file  . Food insecurity - inability: Not on file  . Transportation needs - medical: Not on file  . Transportation needs - non-medical: Not on file  Occupational History  . Not on file  Tobacco Use  . Smoking status: Current Every Day Smoker    Packs/day: 0.20    Types: Cigarettes  . Smokeless tobacco: Never Used  Substance and Sexual Activity  . Alcohol use: No  . Drug use: No  . Sexual activity: Not on file  Other Topics Concern  . Not on file  Social History Narrative  . Not on file    Family History  Problem Relation Age of Onset  . Hypertension Father   . Diabetes Maternal Grandmother   . Hypertension Maternal Grandmother   . Diabetes Maternal Grandfather   . Hypertension Maternal Grandfather     No past surgical history on file.  ROS: Review of Systems As above  PHYSICAL EXAM: BP Marland Kitchen)  175/100   Pulse 96   Temp 98 F (36.7 C) (Oral)   Resp 16   Ht 5\' 11"  (1.803 m)   Wt 247 lb 12.8 oz (112.4 kg)   SpO2 96%   BMI 34.56 kg/m   Wt Readings from Last 3 Encounters:  07/21/17 247 lb 12.8 oz (112.4 kg)  10/17/16 247 lb (112 kg)  08/15/16 249 lb 3.2 oz (113 kg)  Repeat BP 160/92  Physical Exam General appearance - alert, well appearing, obese middle age AAM and in no distress Mental status - alert, oriented to person, place, and time, normal mood, behavior, speech, dress, motor activity, and thought processes Neck - supple, no significant adenopathy Chest - clear to auscultation, no wheezes, rales or rhonchi, symmetric air entry Heart -  S1, S2, irregularly irregular.  no murmurs,  rubs, clicks or gallops Extremities - peripheral pulses normal, no pedal edema, no clubbing or cyanosis MSK:  Mild tenderness on palpation of RT upper thoracic paraspinal muscles just below scapular  ASSESSMENT AND PLAN: 1. Essential hypertension Refill amlodipine and carvedilol.  I recommend taking the amlodipine 1 hour apart from the carvedilol and Eliquis - amLODipine (NORVASC) 10 MG tablet; Take 1 tablet (10 mg total) by mouth daily.  Dispense: 30 tablet; Refill: 11 - carvedilol (COREG) 12.5 MG tablet; Take 1 tablet (12.5 mg total) by mouth 2 (two) times daily with a meal.  Dispense: 60 tablet; Refill: 11 - CBC - Comprehensive metabolic panel - Lipid panel  2. Persistent atrial fibrillation (HCC) - carvedilol (COREG) 12.5 MG tablet; Take 1 tablet (12.5 mg total) by mouth 2 (two) times daily with a meal.  Dispense: 60 tablet; Refill: 11 - apixaban (ELIQUIS) 5 MG TABS tablet; Take 1 tablet (5 mg total) by mouth 2 (two) times daily.  Dispense: 60 tablet; Refill: 5 - Ambulatory referral to Cardiology  3. Paraspinal muscle spasm - cyclobenzaprine (FLEXERIL) 10 MG tablet; Take 1 tablet (10 mg total) by mouth at bedtime.  Dispense: 30 tablet; Refill: 3  4. Influenza vaccination declined  5. Tetanus, diphtheria, and acellular pertussis (Tdap) vaccination declined   6. Tobacco dependence Patient advised to quit smoking. Discussed health risks associated with smoking including lung and other types of cancers, chronic lung diseases and CV risks.. Pt ready to give trail of quitting.  Discussed methods to help quit including quitting cold [place], use of NRT, Chantix and Bupropion.  He is wanting to try the nicotine patches.  I will start him on the 14 mg dose for 1 month then stepdown to 7 mg x 1 month  - nicotine (NICODERM CQ - DOSED IN MG/24 HOURS) 14 mg/24hr patch; Place 1 patch (14 mg total) onto the skin daily.  Dispense: 28 patch; Refill: 0 - nicotine (NICODERM CQ - DOSED IN MG/24 HR) 7  mg/24hr patch; Place 1 patch (7 mg total) onto the skin daily.  Dispense: 28 patch; Refill: 0   Patient was given the opportunity to ask questions.  Patient verbalized understanding of the plan and was able to repeat key elements of the plan.   Orders Placed This Encounter  Procedures  . CBC  . Comprehensive metabolic panel  . Lipid panel  . Ambulatory referral to Cardiology     Requested Prescriptions   Signed Prescriptions Disp Refills  . nicotine (NICODERM CQ - DOSED IN MG/24 HOURS) 14 mg/24hr patch 28 patch 0    Sig: Place 1 patch (14 mg total) onto the skin daily.  . nicotine (NICODERM  CQ - DOSED IN MG/24 HR) 7 mg/24hr patch 28 patch 0    Sig: Place 1 patch (7 mg total) onto the skin daily.  Marland Kitchen. amLODipine (NORVASC) 10 MG tablet 30 tablet 11    Sig: Take 1 tablet (10 mg total) by mouth daily.  . carvedilol (COREG) 12.5 MG tablet 60 tablet 11    Sig: Take 1 tablet (12.5 mg total) by mouth 2 (two) times daily with a meal.  . apixaban (ELIQUIS) 5 MG TABS tablet 60 tablet 5    Sig: Take 1 tablet (5 mg total) by mouth 2 (two) times daily.  . cyclobenzaprine (FLEXERIL) 10 MG tablet 30 tablet 3    Sig: Take 1 tablet (10 mg total) by mouth at bedtime.    Return in about 4 months (around 11/18/2017).  Jonah Blueeborah Braedon Sjogren, MD, FACP

## 2017-07-21 NOTE — Progress Notes (Signed)
Pt states his back pain comes and goes  Pt states amlodipine makes him sick  Pt states he is suppose to be seeing if he is suppose to be taking the eliquis and carvedilol.  Pt states he is going to make an appointment at the cardiologist to see if they want him to take it   Pt denies headaches, chest pains, numbness and tingling

## 2017-07-22 ENCOUNTER — Other Ambulatory Visit: Payer: Self-pay | Admitting: Internal Medicine

## 2017-07-22 LAB — LIPID PANEL
CHOL/HDL RATIO: 4.3 ratio (ref 0.0–5.0)
Cholesterol, Total: 193 mg/dL (ref 100–199)
HDL: 45 mg/dL (ref >39)
LDL CALC: 127 mg/dL — AB (ref 0–99)
TRIGLYCERIDES: 104 mg/dL (ref 0–149)
VLDL Cholesterol Cal: 21 mg/dL (ref 5–40)

## 2017-07-22 LAB — COMPREHENSIVE METABOLIC PANEL
ALT: 16 IU/L (ref 0–44)
AST: 19 IU/L (ref 0–40)
Albumin/Globulin Ratio: 1.3 (ref 1.2–2.2)
Albumin: 4.3 g/dL (ref 3.5–5.5)
Alkaline Phosphatase: 79 IU/L (ref 39–117)
BUN/Creatinine Ratio: 8 — ABNORMAL LOW (ref 9–20)
BUN: 9 mg/dL (ref 6–24)
Bilirubin Total: 0.4 mg/dL (ref 0.0–1.2)
CO2: 26 mmol/L (ref 20–29)
CREATININE: 1.18 mg/dL (ref 0.76–1.27)
Calcium: 9.6 mg/dL (ref 8.7–10.2)
Chloride: 101 mmol/L (ref 96–106)
GFR calc Af Amer: 82 mL/min/{1.73_m2} (ref >59)
GFR calc non Af Amer: 71 mL/min/{1.73_m2} (ref >59)
Globulin, Total: 3.2 g/dL (ref 1.5–4.5)
Glucose: 82 mg/dL (ref 65–99)
POTASSIUM: 4.2 mmol/L (ref 3.5–5.2)
Sodium: 142 mmol/L (ref 134–144)
Total Protein: 7.5 g/dL (ref 6.0–8.5)

## 2017-07-22 LAB — CBC
Hematocrit: 48.9 % (ref 37.5–51.0)
Hemoglobin: 16.6 g/dL (ref 13.0–17.7)
MCH: 29.6 pg (ref 26.6–33.0)
MCHC: 33.9 g/dL (ref 31.5–35.7)
MCV: 87 fL (ref 79–97)
PLATELETS: 230 10*3/uL (ref 150–379)
RBC: 5.6 x10E6/uL (ref 4.14–5.80)
RDW: 14.4 % (ref 12.3–15.4)
WBC: 6.6 10*3/uL (ref 3.4–10.8)

## 2017-07-22 MED ORDER — ATORVASTATIN CALCIUM 10 MG PO TABS: 10.0000 mg | ORAL_TABLET | Freq: Every day | ORAL | 3 refills | Status: DC

## 2017-07-29 ENCOUNTER — Encounter (HOSPITAL_COMMUNITY): Payer: Self-pay | Admitting: *Deleted

## 2017-08-18 ENCOUNTER — Ambulatory Visit (HOSPITAL_COMMUNITY): Payer: Medicare Other | Admitting: Nurse Practitioner

## 2017-08-18 ENCOUNTER — Encounter (HOSPITAL_COMMUNITY): Payer: Self-pay | Admitting: *Deleted

## 2017-08-21 ENCOUNTER — Encounter (HOSPITAL_COMMUNITY): Payer: Self-pay | Admitting: Nurse Practitioner

## 2017-08-21 ENCOUNTER — Ambulatory Visit (HOSPITAL_COMMUNITY)
Admission: RE | Admit: 2017-08-21 | Discharge: 2017-08-21 | Disposition: A | Discharge status: Home / Self Care | Payer: Medicare Other | Source: Ambulatory Visit | Attending: Nurse Practitioner | Admitting: Nurse Practitioner

## 2017-08-21 VITALS — BP 142/84 | HR 103 | Ht 71.0 in | Wt 243.4 lb

## 2017-08-21 DIAGNOSIS — I4819 Other persistent atrial fibrillation: Secondary | ICD-10-CM

## 2017-08-21 DIAGNOSIS — Z79899 Other long term (current) drug therapy: Secondary | ICD-10-CM | POA: Diagnosis not present

## 2017-08-21 DIAGNOSIS — I10 Essential (primary) hypertension: Secondary | ICD-10-CM | POA: Diagnosis not present

## 2017-08-21 DIAGNOSIS — F17210 Nicotine dependence, cigarettes, uncomplicated: Secondary | ICD-10-CM | POA: Insufficient documentation

## 2017-08-21 DIAGNOSIS — R7303 Prediabetes: Secondary | ICD-10-CM | POA: Insufficient documentation

## 2017-08-21 DIAGNOSIS — I481 Persistent atrial fibrillation: Secondary | ICD-10-CM | POA: Insufficient documentation

## 2017-08-21 MED FILL — ATORVASTATIN 10 MG TABLET: 10 | 30 days supply | Qty: 30 | Fill #0

## 2017-08-21 NOTE — Progress Notes (Signed)
Patient ID: Brian Moody, male   DOB: 1966-05-20, 52 y.o.   MRN: [id number]     Primary Care Physician: Marcine Matar, MD Referring Physician: [facility] f/u   Brian Moody is a 52 y.o. male with a h/o tobacco abuse and HTN who presented to [facility] on 06/22/15 with for new-onset dyspnea for the past few months found to be in atrial fibrillation w/ RVR.  He has a long history of HTN and has been taking BP meds for 3 years. He had been out of his meds for several months. He was recently restarted on his BP meds (Lisinopril HCTZ 20-12.5 a day about 2 weeks ago by the nurse at the [facility] and since that time he did not feel well ( frequently thirsty, weak, light headed). He had some lightheadedness, diaphoresis and right sided chest pain at work and presented to the [facility] ED where he was found to be in afib with RVR and AKI felt to be due to dehydration.   He was started on coumadin but then switched to xarelto for chadsvasc score of 1. He was rate controlled with plans for outpatient cardioversion once adequately anticoagulated. He does continue to smoke. Drinks a lot of caffeine, no alcohol, no regular exercise, obese. Admits to snoring and waking up choking and gasping for air at times. Daytime somnolence. 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 He states that he has taken xarelto on a regular basis without missed does and importance of no missed doses emphasized. He is rate controlled today with controlled blood pressure.  He returned to the ER for facial swelling thought possibly related to xarelto, was told to take benadryl, stayed on xarelto and swelling resolved.  On last visit in the afib clinic, he was set up for cardioversion, but he called and cancelled cardioversion because he felt better, thought he had returned to SR and the fact that his wife just had knee surgery and he had to be at home to take care of  her.  He returns today 1/26, and continues in afib. 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 He is willing to be set up for cardioversion in a few weeks after his wife has recovered from knee surgery. He is taking xarelto on a regular basis and is aware not to miss doses.  F/u in afib clinic 11/7. Pt never went thru with cardioversion and am now seeing back after not seeing pt since early 2017.  He is here to see if he needs to continue DOAC. He believes it is making him have back pain. He is living in afib this past year but today he has RVR in the 120's. He states that he is asymptomatic. I question if he may have developed TMC if he has had afib with rvr for the last 10+ months.   F/u in afib clinic, echo reviewed with pt and with normal structure. Being in chronic afib has not effected EF. I discussed trying to restore SR but he is not in favor of a cardioversion which is likely to be needed if AAD therapy is use. I doubt pt would be compliant with AAD because this is the second visit that he said he forgot to take his am meds prior to coming to clinic. His back  feels better on  eliquis but he also bought a new mattress. He will stay on this drug. He continues to  smoke heavily.  F/u in afib clinic 2/23. He is in afib with RVR because he ran out of BB one week ago and thought he would wait and get refills with this visit.He is almost out of eliquis but is waiting on the [facility] to get his paper work ready for pt assistance for this drug. He still reports that he does not feel bad in afib,not aware of irregular heart beat.  F/u in afib clinic, 2/28, for not seeing pt for almost a year, as pt failed to return for f/u.He continues tio smoke but has cut back. States that is is still taking xarelto and carvedilol but wanted to reduce BB dose, he feels BB "pulls him down.". However, with the  stimulant effects of the tobacco and drinking 6-8 Mountain Dew cans a day plus 2 cups of coffee in the am. I don't think I can decrease without having increase in v rate/increase in BP.  Today, he denies symptoms of palpitations, chest pain, shortness of breath, orthopnea, PND, lower extremity edema, dizziness, presyncope, syncope, or neurologic sequela. C/o back pain. The patient is tolerating medications without difficulties and is otherwise without complaint today.   Past Medical History:  Diagnosis Date  . Hypertension   . Persistent atrial fibrillation (HCC)    a. newly diagnosed on 05/2015 admission. started on coumadin.   . Pre-diabetes    a. HgA1c 6.3 05/2015  . Tobacco abuse    No past surgical history on file.  Current Outpatient Medications  Medication Sig Dispense Refill  . albuterol (PROVENTIL HFA;VENTOLIN HFA) 108 (90 BASE) MCG/ACT inhaler Inhale 1-2 puffs into the lungs every 6 (six) hours as needed for wheezing. 1 Inhaler 0  . amLODipine (NORVASC) 10 MG tablet Take 1 tablet (10 mg total) by mouth daily. 30 tablet 11  . apixaban (ELIQUIS) 5 MG TABS tablet Take 1 tablet (5 mg total) by mouth 2 (two) times daily. 60 tablet 5  . carvedilol (COREG) 12.5 MG tablet Take 1 tablet (12.5 mg total) by mouth 2 (two) times daily with a meal. 60 tablet 11  . cyclobenzaprine (FLEXERIL) 10 MG tablet Take 1 tablet (10 mg total) by mouth at bedtime. 30 tablet 3  . atorvastatin (LIPITOR) 10 MG tablet Take 1 tablet (10 mg total) by mouth daily. (Patient not taking: Reported on 08/21/2017) 90 tablet 3   No current facility-administered medications for this encounter.     No Known Allergies  Social History   Socioeconomic History  . Marital status: Married    Spouse name: Not on file  . Number of children: Not on file  . Years of education: Not on file  . Highest education level: Not on file  Social Needs  . Financial resource strain: Not on file  . Food insecurity - worry: Not on  file  . Food insecurity - inability: Not on file  . Transportation needs - medical: Not on file  . Transportation needs - non-medical: Not on file  Occupational History  . Not on file  Tobacco Use  . Smoking status: Current Every Day Smoker    Packs/day: 0.20    Types: Cigarettes  . Smokeless tobacco: Never Used  Substance and Sexual Activity  . Alcohol use: No  . Drug use: No  . Sexual activity: Not on file  Other Topics Concern  . Not on file  Social History Narrative  . Not on file    Family History  Problem Relation Age of Onset  . Hypertension Father   . Diabetes Maternal Grandmother   . Hypertension Maternal Grandmother   . Diabetes Maternal Grandfather   . Hypertension Maternal Grandfather     ROS- All systems are reviewed and negative except as per the HPI above  Physical Exam: Vitals:   08/21/17 0902  BP: (!) 142/84  Pulse: (!) 103  Weight: 243 lb 6.4 oz (110.4 kg)  Height:  (1.803 m)    GEN- The patient is well appearing, alert and oriented x 3 today.   Head- normocephalic, atraumatic Eyes-  Sclera clear, conjunctiva pink Ears- hearing intact Oropharynx- clear Neck- supple, no JVP Lymph- no cervical lymphadenopathy Lungs- Clear to ausculation bilaterally, normal work of breathing Heart- Irregular rate and rhythm, no murmurs, rubs or gallops, PMI not laterally displaced GI- soft, NT, ND, + BS Extremities- no clubbing, cyanosis, or edema MS- no significant deformity or atrophy Skin- no rash or lesion Psych- euthymic mood, full affect Neuro- strength and sensation are intact  EKG- Afib with rvr at 103 bpm, qrs int 78 ms, qtc 429 ms Epic records reviewed Myoview stress test 06/24/15-1. No reversible ischemia or infarction.  2. Moderate global hypokinesis.  3. Left ventricular ejection fraction 41%  4. Intermediate-risk stress test findings*.  Echo-11/17-Study Conclusions  - Left ventricle: The cavity size was normal. Wall thickness  was   normal. Systolic function was normal. The estimated ejection   fraction was in the range of 55% to 60%.   Assessment and Plan:  1.Persistent afib Pt cancelled cardioversion  in January 2017 and did not reschedule in afib clinic Rx for carvedilol 12.5 mg bid given and asked not to run out of drug. He has a chadsvasc score of  1 to soft 3(HTN,  LV dysfunction on previous echo, pre diabetic at one time), I would like him to continue drug to minimize risk of stroke He does not wish to purse getting back in SR at this point and I question if he would be compliant with drug, since he cant remember to take meds at times, and will go weeks sometimes without meds  2.Lifestyle issues Encouraged to lose weight and establish  regular exercise Encouraged tobacco cessation Decrease caffeine intake. IF he can cut back on excessive caffeine, then v rate may be much slower/BP better managed  and cutting dose of BB may be possible  Snoring, sleep study was scheduled but pt did not follow through with test  3. HTN Stable  F/u in afib clinic  in 6 months   Lupita Leash [facility] [address] [place], [place] [address] [phone]

## 2017-08-22 ENCOUNTER — Ambulatory Visit (HOSPITAL_COMMUNITY): Payer: Medicare Other | Admitting: Nurse Practitioner

## 2017-10-13 MED FILL — CYCLOBENZAPRINE 10 MG TAB: 10 | 30 days supply | Qty: 30 | Fill #1

## 2017-10-13 MED FILL — AMLODIPINE BESYLATE 10 MG T: 10 | 30 days supply | Qty: 30 | Fill #1

## 2017-10-13 MED FILL — ELIQUIS 5 MG TABLET: 5 | 30 days supply | Qty: 60 | Fill #0

## 2017-10-13 MED FILL — CARVEDILOL 12.5 MG TABLET: 12.5 | 30 days supply | Qty: 60 | Fill #1

## 2017-10-13 MED FILL — ATORVASTATIN 10 MG TABLET: 10 | 30 days supply | Qty: 30 | Fill #1

## 2018-02-09 MED FILL — ATORVASTATIN 10 MG TABLET: 10 | 30 days supply | Qty: 30 | Fill #2

## 2018-02-09 MED FILL — ELIQUIS 5 MG TABLET: 5 | 30 days supply | Qty: 60 | Fill #1

## 2018-02-09 MED FILL — AMLODIPINE BESYLATE 10 MG T: 10 | 30 days supply | Qty: 30 | Fill #2

## 2018-02-09 MED FILL — CYCLOBENZAPRINE 10 MG TAB: 10 | 30 days supply | Qty: 30 | Fill #2

## 2018-02-09 MED FILL — CARVEDILOL 12.5 MG TABLET: 12.5 | 30 days supply | Qty: 60 | Fill #2

## 2018-04-05 ENCOUNTER — Emergency Department (HOSPITAL_COMMUNITY): Payer: Medicare Other

## 2018-04-05 ENCOUNTER — Encounter (HOSPITAL_COMMUNITY): Payer: Self-pay | Admitting: Emergency Medicine

## 2018-04-05 ENCOUNTER — Emergency Department (HOSPITAL_COMMUNITY)
Admission: EM | Admit: 2018-04-05 | Discharge: 2018-04-05 | Disposition: A | Discharge status: Home / Self Care | Payer: Medicare Other | Attending: Emergency Medicine | Admitting: Emergency Medicine

## 2018-04-05 ENCOUNTER — Other Ambulatory Visit: Payer: Self-pay

## 2018-04-05 DIAGNOSIS — X58XXXA Exposure to other specified factors, initial encounter: Secondary | ICD-10-CM | POA: Insufficient documentation

## 2018-04-05 DIAGNOSIS — F17210 Nicotine dependence, cigarettes, uncomplicated: Secondary | ICD-10-CM | POA: Insufficient documentation

## 2018-04-05 DIAGNOSIS — Y939 Activity, unspecified: Secondary | ICD-10-CM | POA: Diagnosis not present

## 2018-04-05 DIAGNOSIS — S39012A Strain of muscle, fascia and tendon of lower back, initial encounter: Secondary | ICD-10-CM | POA: Diagnosis not present

## 2018-04-05 DIAGNOSIS — I10 Essential (primary) hypertension: Secondary | ICD-10-CM | POA: Diagnosis not present

## 2018-04-05 DIAGNOSIS — Z79899 Other long term (current) drug therapy: Secondary | ICD-10-CM | POA: Insufficient documentation

## 2018-04-05 DIAGNOSIS — Y999 Unspecified external cause status: Secondary | ICD-10-CM | POA: Diagnosis not present

## 2018-04-05 DIAGNOSIS — Y929 Unspecified place or not applicable: Secondary | ICD-10-CM | POA: Diagnosis not present

## 2018-04-05 DIAGNOSIS — M545 Low back pain: Secondary | ICD-10-CM | POA: Diagnosis not present

## 2018-04-05 MED ORDER — LIDOCAINE 5 % EX PTCH
1.0000 | MEDICATED_PATCH | CUTANEOUS | Status: DC
  Administered 2018-04-05: 1 via TRANSDERMAL
  Filled 2018-04-05: qty 1

## 2018-04-05 MED ORDER — LIDOCAINE 5 % EX PTCH: 1.0000 | MEDICATED_PATCH | CUTANEOUS | 0 refills | Status: DC

## 2018-04-05 MED ORDER — OXYCODONE-ACETAMINOPHEN 5-325 MG PO TABS
1.0000 | ORAL_TABLET | Freq: Once | ORAL | Status: AC
  Administered 2018-04-05: 1 via ORAL
  Filled 2018-04-05: qty 1

## 2018-04-05 MED ORDER — TRAMADOL HCL 50 MG PO TABS: 50.0000 mg | ORAL_TABLET | Freq: Four times a day (QID) | ORAL | 0 refills | Status: DC | PRN

## 2018-04-05 NOTE — ED Notes (Signed)
Pt states his nephew is on his way to pick him up

## 2018-04-05 NOTE — ED Triage Notes (Signed)
Pt. Stated, Brian Moody had back pain for a week after pressure washing last week, its in the middle to lower.

## 2018-04-05 NOTE — ED Provider Notes (Addendum)
[facility] EMERGENCY DEPARTMENT Provider Note   CSN: [id number] Arrival date & time: 04/05/18  7829     History   Chief Complaint Chief Complaint  Patient presents with  . Back Pain    HPI Brian Moody is a 52 y.o. male with a past medical history of hypertension, who presents to ED for evaluation of back pain for the past week.  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  States that the pain is located on the right side of his back.  He described it as a pressure, aching sensation.  He had similar symptoms several years ago and has been prescribed daily muscle relaxer since.  States that last time, " they gave me a couple Percocet and I felt better."  Denies any fever, chest pain, dysuria, hematuria, prior back surgeries, history of cancer, history of IV drug use, numbness in arms or legs.  HPI  Past Medical History:  Diagnosis Date  . Hypertension   . Persistent atrial fibrillation    a. newly diagnosed on 05/2015 admission. started on coumadin.   . Pre-diabetes    a. HgA1c 6.3 05/2015  . Tobacco abuse     Patient Active Problem List   Diagnosis Date Noted  . Midline low back pain without sciatica 03/29/2016  . Obesity (BMI 30-39.9) 03/29/2016  . Heel pain, bilateral 07/20/2015  . Essential hypertension   . Tobacco abuse   . Persistent atrial fibrillation   . Pre-diabetes   . Vitamin D insufficiency 08/24/2013  . Smoking 08/24/2013  . Other and unspecified hyperlipidemia 08/24/2013  . Avulsion fracture of distal fibula 06/02/2013  . Smoker 12/30/2012  . Generalized headaches 12/30/2012    History reviewed. No pertinent surgical history.      Home Medications    Prior to Admission medications   Medication Sig Start Date End Date Taking? Authorizing Provider  albuterol (PROVENTIL HFA;VENTOLIN HFA) 108 (90 BASE) MCG/ACT inhaler Inhale 1-2 puffs into the lungs every 6 (six)  hours as needed for wheezing. 07/01/14   Rolland Porter, MD  amLODipine (NORVASC) 10 MG tablet Take 1 tablet (10 mg total) by mouth daily. 07/21/17   Marcine Matar, MD  apixaban (ELIQUIS) 5 MG TABS tablet Take 1 tablet (5 mg total) by mouth 2 (two) times daily. 07/21/17   Marcine Matar, MD  atorvastatin (LIPITOR) 10 MG tablet Take 1 tablet (10 mg total) by mouth daily. Patient not taking: Reported on 08/21/2017 07/22/17   Marcine Matar, MD  carvedilol (COREG) 12.5 MG tablet Take 1 tablet (12.5 mg total) by mouth 2 (two) times daily with a meal. 07/21/17   Marcine Matar, MD  cyclobenzaprine (FLEXERIL) 10 MG tablet Take 1 tablet (10 mg total) by mouth at bedtime. 07/21/17   Marcine Matar, MD  traMADol (ULTRAM) 50 MG tablet Take 1 tablet (50 mg total) by mouth every 6 (six) hours as needed. 04/05/18   Dietrich Pates, PA-C    Family History Family History  Problem Relation Age of Onset  . Hypertension Father   . Diabetes Maternal Grandmother   . Hypertension Maternal Grandmother   . Diabetes Maternal Grandfather   . Hypertension Maternal Grandfather     Social History Social History   Tobacco Use  . Smoking status: Current Every Day Smoker    Packs/day: 0.20    Types: Cigarettes  . Smokeless tobacco: Never Used  Substance Use  Topics  . Alcohol use: No  . Drug use: No     Allergies   Patient has no known allergies.   Review of Systems Review of Systems  Constitutional: Negative for chills and fever.  Cardiovascular: Negative for chest pain.  Genitourinary: Negative for dysuria and hematuria.  Musculoskeletal: Positive for back pain and myalgias.  Skin: Negative for wound.     Physical Exam Updated Vital Signs BP (!) 144/101 (BP Location: Right Arm)   Pulse 92   Temp 98.7 F (37.1 C) (Oral)   Resp (!) 22   Ht 5' 10.5" (1.791 m)   SpO2 100%   BMI 34.43 kg/m   Physical Exam  Constitutional: He appears well-developed and well-nourished. No distress.    HENT:  Head: Normocephalic and atraumatic.  Eyes: Conjunctivae and EOM are normal. No scleral icterus.  Neck: Normal range of motion.  Cardiovascular: Normal rate, regular rhythm and normal heart sounds.  Pulmonary/Chest: Effort normal and breath sounds normal. No respiratory distress.  Abdominal: There is no tenderness.  Musculoskeletal:       Back:  No midline spinal tenderness present in lumbar, thoracic or cervical spine. No step-off palpated. No visible bruising, edema or temperature change noted. No objective signs of numbness present. No saddle anesthesia. 2+ DP pulses bilaterally. Sensation intact to light touch. Strength 5/5 in bilateral lower extremities.  Neurological: He is alert.  Skin: No rash noted. He is not diaphoretic.  Psychiatric: He has a normal mood and affect.  Nursing note and vitals reviewed.    ED Treatments / Results  Labs (all labs ordered are listed, but only abnormal results are displayed) Labs Reviewed - No data to display  EKG None  Radiology Dg Lumbar Spine Complete  Result Date: 04/05/2018 CLINICAL DATA:  Low back pain for 1 week following over exertion, initial encounter EXAM: LUMBAR SPINE - COMPLETE 4+ VIEW COMPARISON:  None. FINDINGS: Five lumbar type vertebral bodies are well visualized. Vertebral body height is well maintained. No pars defects are seen. Mild osteophytic changes are noted. IMPRESSION: Degenerative change without acute abnormality. Electronically Signed   By: Alcide Clever M.D.   On: 04/05/2018 10:39    Procedures Procedures (including critical care time)  Medications Ordered in ED Medications  oxyCODONE-acetaminophen (PERCOCET/ROXICET) 5-325 MG per tablet 1 tablet (1 tablet Oral Given 04/05/18 0946)     Initial Impression / Assessment and Plan / ED Course  I have reviewed the triage vital signs and the nursing notes.  Pertinent labs & imaging results that were available during my care of the patient were reviewed by  me and considered in my medical decision making (see chart for details).     Patient denies any concerning symptoms suggestive of cauda equina requiring urgent imaging at this time such as loss of sensation in the lower extremities, lower extremity weakness, loss of bowel or bladder control, saddle anesthesia, urinary retention, fever/chills, IVDU. Exam demonstrated no  weakness on exam today. No preceding injury or trauma to suggest acute fracture. Doubt pelvic or urinary pathology for patient's acute back pain, as patient denies urinary symptoms. Doubt AAA as cause of patient's back pain as patient lacks major risk factors, had no abdominal TTP, and has symmetric and intact distal pulses. Patient given strict return precautions for any symptoms indicating worsening neurologic function in the lower extremities. LXR negative, indicated due to patient's age and new back pain. Will give short course of tramadol and advise PCP f/u. Advised to return to ED  for any severe or worsening symptoms. [place] PMP queried with no discrepancies.   10:55 AM I spoke to patient regarding findings.  I offered him a prescription for tramadol.  However, he states that "no those things don't work for me, it's like eating skittles, I got bottles and bottles of that stuff at home and it don't work."  Instead will offer him a lidocaine patch.  Portions of this note were generated with Scientist, clinical (histocompatibility and immunogenetics). Dictation errors may occur despite best attempts at proofreading.   Final Clinical Impressions(s) / ED Diagnoses   Final diagnoses:  Strain of lumbar region, initial encounter    ED Discharge Orders         Ordered    traMADol (ULTRAM) 50 MG tablet  Every 6 hours PRN     04/05/18 1042           Dietrich Pates, PA-C 04/05/18 1043    Dietrich Pates, PA-C 04/05/18 1056    Mancel Bale, MD 04/05/18 1625

## 2018-04-05 NOTE — Discharge Instructions (Addendum)
Your x-ray showed that you have degenerative changes of your spine but otherwise was negative for any abnormalities. Take the pain medication as prescribed to you. Use heating pad, stretching and massaging the area will also help. Return to ED for worsening symptoms, numbness in arms or legs, chest pain, shortness of breath, injuries or falls.

## 2018-04-05 NOTE — ED Notes (Signed)
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  He is ambulatory without difficulty.  He denies tingling and numbness of his BLE.  He reports chronic back pain of which he already takes muscle relaxer for.  Took a dose this am.  Pt appears upset.  States he was getting ready to leave and go to [facility] ED d/t wait.

## 2018-08-31 ENCOUNTER — Other Ambulatory Visit: Payer: Self-pay | Admitting: Internal Medicine

## 2018-08-31 DIAGNOSIS — I4819 Other persistent atrial fibrillation: Secondary | ICD-10-CM

## 2018-08-31 DIAGNOSIS — I10 Essential (primary) hypertension: Secondary | ICD-10-CM

## 2018-09-01 ENCOUNTER — Emergency Department (HOSPITAL_COMMUNITY): Payer: Medicare Other

## 2018-09-01 ENCOUNTER — Emergency Department (HOSPITAL_COMMUNITY)
Admission: EM | Admit: 2018-09-01 | Discharge: 2018-09-02 | Disposition: A | Discharge status: Home / Self Care | Payer: Medicare Other | Attending: Emergency Medicine | Admitting: Emergency Medicine

## 2018-09-01 ENCOUNTER — Encounter (HOSPITAL_COMMUNITY): Payer: Self-pay

## 2018-09-01 ENCOUNTER — Other Ambulatory Visit: Payer: Self-pay

## 2018-09-01 DIAGNOSIS — Z7901 Long term (current) use of anticoagulants: Secondary | ICD-10-CM | POA: Diagnosis not present

## 2018-09-01 DIAGNOSIS — I10 Essential (primary) hypertension: Secondary | ICD-10-CM | POA: Diagnosis not present

## 2018-09-01 DIAGNOSIS — Z79899 Other long term (current) drug therapy: Secondary | ICD-10-CM | POA: Diagnosis not present

## 2018-09-01 DIAGNOSIS — I4891 Unspecified atrial fibrillation: Secondary | ICD-10-CM

## 2018-09-01 DIAGNOSIS — I4819 Other persistent atrial fibrillation: Secondary | ICD-10-CM | POA: Diagnosis not present

## 2018-09-01 DIAGNOSIS — F17210 Nicotine dependence, cigarettes, uncomplicated: Secondary | ICD-10-CM | POA: Diagnosis not present

## 2018-09-01 DIAGNOSIS — R079 Chest pain, unspecified: Secondary | ICD-10-CM | POA: Diagnosis not present

## 2018-09-01 LAB — CBC
HEMATOCRIT: 45 % (ref 39.0–52.0)
HEMOGLOBIN: 14.1 g/dL (ref 13.0–17.0)
MCH: 28.4 pg (ref 26.0–34.0)
MCHC: 31.3 g/dL (ref 30.0–36.0)
MCV: 90.7 fL (ref 80.0–100.0)
Platelets: 218 10*3/uL (ref 150–400)
RBC: 4.96 MIL/uL (ref 4.22–5.81)
RDW: 14.3 % (ref 11.5–15.5)
WBC: 9.5 10*3/uL (ref 4.0–10.5)
nRBC: 0 % (ref 0.0–0.2)

## 2018-09-01 LAB — BASIC METABOLIC PANEL
Anion gap: 10 (ref 5–15)
BUN: 13 mg/dL (ref 6–20)
CO2: 23 mmol/L (ref 22–32)
CREATININE: 1.23 mg/dL (ref 0.61–1.24)
Calcium: 8.9 mg/dL (ref 8.9–10.3)
Chloride: 105 mmol/L (ref 98–111)
GFR calc non Af Amer: >60 mL/min (ref >60)
Glucose, Bld: 85 mg/dL (ref 70–99)
Potassium: 3.1 mmol/L — ABNORMAL LOW (ref 3.5–5.1)
SODIUM: 138 mmol/L (ref 135–145)

## 2018-09-01 LAB — PROTIME-INR
INR: 1.3 — ABNORMAL HIGH (ref 0.8–1.2)
Prothrombin Time: 15.6 seconds — ABNORMAL HIGH (ref 11.4–15.2)

## 2018-09-01 LAB — I-STAT TROPONIN, ED: Troponin i, poc: 0.01 ng/mL (ref 0.00–0.08)

## 2018-09-01 MED ORDER — METOPROLOL TARTRATE 25 MG PO TABS
50.0000 mg | ORAL_TABLET | Freq: Once | ORAL | Status: AC
  Administered 2018-09-01: 50 mg via ORAL
  Filled 2018-09-01: qty 2

## 2018-09-01 MED ORDER — APIXABAN 5 MG PO TABS
5.0000 mg | ORAL_TABLET | Freq: Two times a day (BID) | ORAL | Status: DC
  Administered 2018-09-01: 5 mg via ORAL
  Filled 2018-09-01: qty 1

## 2018-09-01 NOTE — Discharge Instructions (Addendum)
I have prescribed medication to help with your blood pressure along to further manage your atrial fibrillation.  Please take this medication as prescribed.  I have placed an order for ambulatory referral to atrial fibrillation clinic.  They should contact you to follow-up with the atrial fibrillation clinic within 1 week.   Return to the ER for light headedness, passing out, chest pain or shortness of breath with activity or exertion, palpitations, severe headaches

## 2018-09-01 NOTE — ED Provider Notes (Addendum)
[facility] EMERGENCY DEPARTMENT Provider Note   CSN: [id number] Arrival date & time: 09/01/18  1535    History   Chief Complaint Chief Complaint  Patient presents with  . Hypertension    HPI Brian Moody. is a 53 y.o. male.     53 y.o male with a PMH of HTN, Asthma, Afib presents to the ED with a chief complaint of out of medication x 3 months.  Patient reports he is followed by the [facility], states he has not had his medication in about 3 months.  Patient reports checking his blood pressure at the store and stating it was very elevated.  He also endorses some episodes of dizziness, reports the dizziness is worse from sitting to standing along with bending.  Patient was originally prescribed carvedilol, reports he has not taken this.  He also reports taking Eliquis, has also been out of this medication for the past 3 months states that he does not know what he takes it for.  Patient denies any chest pain, shortness of breath, headache, abdominal pain or weakness.     Past Medical History:  Diagnosis Date  . Hypertension   . Persistent atrial fibrillation    a. newly diagnosed on 05/2015 admission. started on coumadin.   . Pre-diabetes    a. HgA1c 6.3 05/2015  . Tobacco abuse     Patient Active Problem List   Diagnosis Date Noted  . Midline low back pain without sciatica 03/29/2016  . Obesity (BMI 30-39.9) 03/29/2016  . Heel pain, bilateral 07/20/2015  . Essential hypertension   . Tobacco abuse   . Persistent atrial fibrillation   . Pre-diabetes   . Vitamin D insufficiency 08/24/2013  . Smoking 08/24/2013  . Other and unspecified hyperlipidemia 08/24/2013  . Avulsion fracture of distal fibula 06/02/2013  . Smoker 12/30/2012  . Generalized headaches 12/30/2012    History reviewed. No pertinent surgical history.      Home Medications    Prior to Admission medications   Medication Sig Start Date End Date Taking?  Authorizing Provider  acetaminophen (TYLENOL) 500 MG tablet Take 1,000 mg by mouth every 6 (six) hours as needed for headache.   Yes [provider]  amLODipine (NORVASC) 10 MG tablet Take 1 tablet (10 mg total) by mouth daily. 07/21/17  Yes Marcine Matar, MD  albuterol (PROVENTIL HFA;VENTOLIN HFA) 108 (90 BASE) MCG/ACT inhaler Inhale 1-2 puffs into the lungs every 6 (six) hours as needed for wheezing. Patient not taking: Reported on 09/01/2018 07/01/14   Rolland Porter, MD  apixaban (ELIQUIS) 5 MG TABS tablet Take 1 tablet (5 mg total) by mouth 2 (two) times daily for 30 days. 09/02/18 10/02/18  Claude Manges, PA-C  atorvastatin (LIPITOR) 10 MG tablet Take 1 tablet (10 mg total) by mouth daily. Patient not taking: Reported on 09/01/2018 07/22/17   Marcine Matar, MD  carvedilol (COREG) 12.5 MG tablet Take 1 tablet (12.5 mg total) by mouth 2 (two) times daily with a meal for 30 days. 09/02/18 10/02/18  Claude Manges, PA-C  cyclobenzaprine (FLEXERIL) 10 MG tablet Take 1 tablet (10 mg total) by mouth at bedtime. Patient not taking: Reported on 09/01/2018 07/21/17   Marcine Matar, MD  lidocaine (LIDODERM) 5 % Place 1 patch onto the skin daily. Remove & Discard patch within 12 hours or as directed by MD Patient not taking: Reported on 09/01/2018 04/05/18   Dietrich Pates, PA-C    Family  History Family History  Problem Relation Age of Onset  . Hypertension Father   . Diabetes Maternal Grandmother   . Hypertension Maternal Grandmother   . Diabetes Maternal Grandfather   . Hypertension Maternal Grandfather     Social History Social History   Tobacco Use  . Smoking status: Current Every Day Smoker    Packs/day: 0.20    Types: Cigarettes  . Smokeless tobacco: Never Used  Substance Use Topics  . Alcohol use: No  . Drug use: No     Allergies   Patient has no known allergies.   Review of Systems Review of Systems  Constitutional: Negative for chills and fever.  HENT: Negative for  ear pain and sore throat.   Eyes: Negative for pain and visual disturbance.  Respiratory: Negative for cough and shortness of breath.   Cardiovascular: Negative for chest pain and palpitations.  Gastrointestinal: Negative for abdominal pain and vomiting.  Genitourinary: Negative for dysuria and hematuria.  Musculoskeletal: Negative for arthralgias and back pain.  Skin: Negative for color change and rash.  Neurological: Positive for dizziness. Negative for seizures and syncope.  All other systems reviewed and are negative.    Physical Exam Updated Vital Signs BP (!) 166/102 (BP Location: Right Arm)   Pulse (!) 106   Temp 98.4 F (36.9 C) (Oral)   Resp 16   SpO2 100%   Physical Exam Vitals signs and nursing note reviewed.  Constitutional:      Appearance: He is well-developed.  HENT:     Head: Normocephalic and atraumatic.     Mouth/Throat:     Comments: Oropharynx was clear, no erythema, edema present.  Eyes:     General: No scleral icterus.    Pupils: Pupils are equal, round, and reactive to light.  Neck:     Musculoskeletal: Normal range of motion.  Cardiovascular:     Heart sounds: Normal heart sounds.  Pulmonary:     Effort: Pulmonary effort is normal.     Breath sounds: Normal breath sounds. No wheezing.     Comments: Lung sounds are diminished. Chest:     Chest wall: No tenderness.  Abdominal:     General: Bowel sounds are normal. There is no distension.     Palpations: Abdomen is soft.     Tenderness: There is no abdominal tenderness.  Musculoskeletal:        General: No tenderness or deformity.  Skin:    General: Skin is warm and dry.  Neurological:     Mental Status: He is alert and oriented to person, place, and time.     Comments: Alert, oriented, thought content appropriate. Speech fluent without evidence of aphasia. Able to follow 2 step commands without difficulty.  Cranial Nerves:  II:  Peripheral visual fields grossly normal, pupils, round,  reactive to light III,IV, VI: ptosis not present, extra-ocular motions intact bilaterally  V,VII: smile symmetric, facial light touch sensation equal VIII: hearing grossly normal bilaterally  IX,X: midline uvula rise  XI: bilateral shoulder shrug equal and strong XII: midline tongue extension  Motor:  5/5 in upper and lower extremities bilaterally including strong and equal grip strength and dorsiflexion/plantar flexion Sensory: light touch normal in all extremities.  Cerebellar: normal finger-to-nose with bilateral upper extremities, pronator drift negative        ED Treatments / Results  Labs (all labs ordered are listed, but only abnormal results are displayed) Labs Reviewed  BASIC METABOLIC PANEL - Abnormal; Notable for the following components:  Result Value   Potassium 3.1 (*)    All other components within normal limits  PROTIME-INR - Abnormal; Notable for the following components:   Prothrombin Time 15.6 (*)    INR 1.3 (*)    All other components within normal limits  CBC  I-STAT TROPONIN, ED    EKG EKG Interpretation  Date/Time:  Tuesday September 01 2018 21:56:40 EDT Ventricular Rate:  108 PR Interval:    QRS Duration: 76 QT Interval:  342 QTC Calculation: 459 R Axis:   107 Text Interpretation:  Atrial fibrillation Lateral infarct, old Probable anteroseptal infarct, old c/w 08/21/2017 Confirmed by Eber Hong ([id number]) on 09/02/2018 12:08:15 AM   Radiology Dg Chest 2 View  Result Date: 09/01/2018 CLINICAL DATA:  Chest pain EXAM: CHEST - 2 VIEW COMPARISON:  07/19/2016 FINDINGS: The heart size and mediastinal contours are within normal limits. Both lungs are clear. The visualized skeletal structures are unremarkable. IMPRESSION: No active cardiopulmonary disease. Electronically Signed   By: Deatra Robinson M.D.   On: 09/01/2018 22:22    Procedures Procedures (including critical care time)  Medications Ordered in ED Medications  apixaban (ELIQUIS) tablet 5  mg (5 mg Oral Given 09/01/18 2320)  diltiazem (CARDIZEM) injection 20 mg (has no administration in time range)  metoprolol tartrate (LOPRESSOR) tablet 50 mg (50 mg Oral Given 09/01/18 2319)     Initial Impression / Assessment and Plan / ED Course  I have reviewed the triage vital signs and the nursing notes.  Pertinent labs & imaging results that were available during my care of the patient were reviewed by me and considered in my medical decision making (see chart for details).      Patient with a previous history of hypertension, A. fib presents to the ED for out of medication for the past 3 months.  Patient reports he was on carvedilol Eliquis but has not taken this medication in the past 3 months.  He reports some episodes of dizziness, no syncope.  Denies any chest pain, shortness of breath, headache.  BMP showed slight decrease in potassium at 3.1, CBC showed no leukocytosis, hemoglobin is within normal limits.  First troponin was negative.  EKG shows patient in A. fib, will provide him with Eliquis 5 mg along with carvedilol to help with his high blood pressure.  His neurological exam is reassuring.  No CT imaging needed at this time.  Chest x-ray showed: No consolidation, pneumothorax, pleural effusion.  12:05 AM Patient evaluated by my attending Dr. Hyacinth Meeker, will provide him with IV Cardizem 20 mg at this time to help with his blood pressure along with heart rate.  12:18 AM Patient care transferred to incoming PA, pending reassess HR and BP.  Patient understands and agrees with management.  Patient would not like to stay at this time. Patient will be disposed home.  Final Clinical Impressions(s) / ED Diagnoses   Final diagnoses:  Essential hypertension  Atrial fibrillation, unspecified type [facility])    ED Discharge Orders         Ordered    apixaban (ELIQUIS) 5 MG TABS tablet  2 times daily    Note to Pharmacy:  Waiting on patient assistance   09/02/18 0009    carvedilol (COREG)  12.5 MG tablet  2 times daily with meals     09/02/18 0009           Claude Manges, PA-C 09/02/18 0018    Claude Manges, PA-C 09/02/18 0023    Eber Hong, MD  09/02/18 0722  

## 2018-09-01 NOTE — ED Triage Notes (Signed)
Pt states he has been out his BP medications for 3 months now, unable to check his BP at home but states he feel like its high due to his intermittent dizziness. Bp 161/109 in triage. Pt also having SOB on exertion. Pt a.o, nad noted

## 2018-09-02 DIAGNOSIS — I10 Essential (primary) hypertension: Secondary | ICD-10-CM | POA: Diagnosis not present

## 2018-09-02 MED ORDER — CARVEDILOL 12.5 MG PO TABS: 12.5000 mg | ORAL_TABLET | Freq: Two times a day (BID) | ORAL | 0 refills | Status: DC

## 2018-09-02 MED ORDER — APIXABAN 5 MG PO TABS: 5.0000 mg | ORAL_TABLET | Freq: Two times a day (BID) | ORAL | 0 refills | Status: DC

## 2018-09-02 MED ORDER — DILTIAZEM HCL 25 MG/5ML IV SOLN
20.0000 mg | Freq: Once | INTRAVENOUS | Status: AC
  Administered 2018-09-02: 20 mg via INTRAVENOUS
  Filled 2018-09-02: qty 5

## 2018-09-02 MED FILL — ELIQUIS 5 MG TABLET: 5 | 30 days supply | Qty: 60 | Fill #0

## 2018-09-02 MED FILL — CARVEDILOL 12.5 MG TABLET: 12.5 | 30 days supply | Qty: 60 | Fill #0

## 2018-09-02 NOTE — ED Provider Notes (Addendum)
The patient is a 53 year old male, unfortunately he has been off his medications for several months including blood pressure cholesterol and his anticoagulant for his history of atrial fibrillation which seems to be persistent.  He comes in this evening because of some intermittent dizziness and concerned that his blood pressure has been rising.  On my exam the patient is in no distress, he is in atrial fibrillation with rates ranging between 90 and 115, he has a soft abdomen which is nontender, no edema and has a totally normal mental status and neurologic exam.  The patient will need to have some rate control, he will need to be replaced on his Eliquis, anticipate that he can be discharged after this as he is very stable appearing and does not want to stay in the hospital.      This patients CHA2DS2-VASc Score and unadjusted Ischemic Stroke Rate (% per year) is equal to 0.6 % stroke rate/year from a score of 1  Above score calculated as 1 point each if present [CHF, HTN, DM, Vascular=MI/PAD/Aortic Plaque, Age if 41-74, or Male] Above score calculated as 2 points each if present [Age > 75, or Stroke/TIA/TE]  CHA2DS2/VAS Stroke Risk Points  Current as of 30 minutes ago     1 >= 2 Points: High Risk  1 - 1.99 Points: Medium Risk  0 Points: Low Risk    This is the only CHA2DS2/VAS Stroke Risk Points available for the past  year.:  Last Change: N/A     Details    This score determines the patient's risk of having a stroke if the  patient has atrial fibrillation.       Points Metrics  0 Has Congestive Heart Failure:  No    Current as of 30 minutes ago  0 Has Vascular Disease:  No    Current as of 30 minutes ago  1 Has Hypertension:  Yes    Current as of 30 minutes ago  0 Age:  46    Current as of 30 minutes ago  0 Has Diabetes:  No    Current as of 30 minutes ago  0 Had Stroke:  No  Had TIA:  No  Had thromboembolism:  No    Current as of 30 minutes ago  0 Male:  No    Current as  of 30 minutes ago   At change of shift - the patient has a slight tachycardia which I would like to be lower prior to d/c - however he is very stable appearing and I expect and anticipate d/c when this improves - Cardizem ordered   EKG Interpretation  Date/Time:  Tuesday September 01 2018 21:56:40 EDT Ventricular Rate:  108 PR Interval:    QRS Duration: 76 QT Interval:  342 QTC Calculation: 459 R Axis:   107 Text Interpretation:  Atrial fibrillation Lateral infarct, old Probable anteroseptal infarct, old c/w 08/21/2017 Confirmed by Eber Hong ([id number]) on 09/02/2018 12:08:15 AM      Medical screening examination/treatment/procedure(s) were conducted as a shared visit with non-physician practitioner(s) and myself.  I personally evaluated the patient during the encounter.  Clinical Impression:   Final diagnoses:  Essential hypertension  Atrial fibrillation, unspecified type [facility])         Eber Hong, MD 09/02/18 3212    Eber Hong, MD 09/02/18 [phone]

## 2018-09-02 NOTE — ED Notes (Signed)
Patient verbalized understanding on his discharge instructions/medications .

## 2018-09-02 NOTE — ED Provider Notes (Signed)
0020: Pt handed off to me by previous EDPA at shift change pending re-check BP and HR. See previous note for full details. Briefly, pt here for elevated BP.  Non compliant with meds for 3 months.  Plan is to re-check BP/HR and discharge if improving.  Physical Exam  BP 139/82 (BP Location: Right Arm)   Pulse 71   Temp 98.4 F (36.9 C) (Oral)   Resp 18   SpO2 99%   Physical Exam Constitutional:      Appearance: He is well-developed. He is not toxic-appearing.     Comments: No distress, sitting in hall bed   HENT:     Head: Normocephalic.     Right Ear: External ear normal.     Left Ear: External ear normal.     Nose: Nose normal.  Eyes:     Conjunctiva/sclera: Conjunctivae normal.  Neck:     Musculoskeletal: Full passive range of motion without pain.  Cardiovascular:     Rate and Rhythm: Normal rate.  Pulmonary:     Effort: Pulmonary effort is normal. No tachypnea or respiratory distress.  Musculoskeletal: Normal range of motion.  Skin:    General: Skin is warm and dry.     Capillary Refill: Capillary refill takes less than 2 seconds.  Neurological:     Mental Status: He is alert and oriented to person, place, and time.  Psychiatric:        Behavior: Behavior normal.        Thought Content: Thought content normal.     ED Course/Procedures     Procedures  MDM   0140: K 3.1. EKG shows atrial fibrillation HR 108.  BP and HR normalized after cardizem and lopressor.  Discussed plan with pt to dc, rx sent to pharmacy by previous EDPA.  I have sent ambulatory referral to a-fib clinic to facilitate follow up, pt aware he needs to f/u with a-fib clinic. Return precautions given at dc         Liberty Handy, PA-C 09/02/18 0140    Eber Hong, MD 09/02/18 [phone]

## 2018-09-03 ENCOUNTER — Other Ambulatory Visit: Payer: Self-pay

## 2018-09-03 ENCOUNTER — Encounter: Payer: Self-pay | Admitting: Internal Medicine

## 2018-09-03 ENCOUNTER — Ambulatory Visit: Payer: Medicare Other | Attending: Internal Medicine | Admitting: Internal Medicine

## 2018-09-03 VITALS — BP 171/129 | HR 87 | Temp 98.2°F | Resp 16 | Ht 70.0 in | Wt 236.6 lb

## 2018-09-03 DIAGNOSIS — F17200 Nicotine dependence, unspecified, uncomplicated: Secondary | ICD-10-CM

## 2018-09-03 DIAGNOSIS — Z6833 Body mass index (BMI) 33.0-33.9, adult: Secondary | ICD-10-CM | POA: Insufficient documentation

## 2018-09-03 DIAGNOSIS — Z79899 Other long term (current) drug therapy: Secondary | ICD-10-CM | POA: Insufficient documentation

## 2018-09-03 DIAGNOSIS — F17210 Nicotine dependence, cigarettes, uncomplicated: Secondary | ICD-10-CM | POA: Insufficient documentation

## 2018-09-03 DIAGNOSIS — R7303 Prediabetes: Secondary | ICD-10-CM | POA: Diagnosis not present

## 2018-09-03 DIAGNOSIS — E7849 Other hyperlipidemia: Secondary | ICD-10-CM | POA: Insufficient documentation

## 2018-09-03 DIAGNOSIS — G8929 Other chronic pain: Secondary | ICD-10-CM | POA: Diagnosis not present

## 2018-09-03 DIAGNOSIS — I482 Chronic atrial fibrillation, unspecified: Secondary | ICD-10-CM | POA: Insufficient documentation

## 2018-09-03 DIAGNOSIS — Z7901 Long term (current) use of anticoagulants: Secondary | ICD-10-CM | POA: Diagnosis not present

## 2018-09-03 DIAGNOSIS — M19012 Primary osteoarthritis, left shoulder: Secondary | ICD-10-CM | POA: Insufficient documentation

## 2018-09-03 DIAGNOSIS — I10 Essential (primary) hypertension: Secondary | ICD-10-CM | POA: Diagnosis not present

## 2018-09-03 DIAGNOSIS — E669 Obesity, unspecified: Secondary | ICD-10-CM | POA: Insufficient documentation

## 2018-09-03 DIAGNOSIS — Z1211 Encounter for screening for malignant neoplasm of colon: Secondary | ICD-10-CM | POA: Insufficient documentation

## 2018-09-03 DIAGNOSIS — Z8249 Family history of ischemic heart disease and other diseases of the circulatory system: Secondary | ICD-10-CM | POA: Insufficient documentation

## 2018-09-03 MED ORDER — AMLODIPINE BESYLATE 10 MG PO TABS: 10.0000 mg | ORAL_TABLET | Freq: Every day | ORAL | 6 refills | Status: DC

## 2018-09-03 MED ORDER — CARVEDILOL 12.5 MG PO TABS: 12.5000 mg | ORAL_TABLET | Freq: Two times a day (BID) | ORAL | 6 refills | Status: DC

## 2018-09-03 MED ORDER — APIXABAN 5 MG PO TABS: 5.0000 mg | ORAL_TABLET | Freq: Two times a day (BID) | ORAL | 6 refills | Status: DC

## 2018-09-03 MED ORDER — ATORVASTATIN CALCIUM 10 MG PO TABS: 10.0000 mg | ORAL_TABLET | Freq: Every day | ORAL | 6 refills | Status: DC

## 2018-09-03 MED FILL — AMLODIPINE BESYLATE 10 MG T: 10 | 30 days supply | Qty: 30 | Fill #0

## 2018-09-03 MED FILL — ATORVASTATIN 10 MG TABLET: 10 | 30 days supply | Qty: 30 | Fill #0

## 2018-09-03 NOTE — Patient Instructions (Addendum)
Please give patient an appointment with the clinical pharmacist in 2 weeks for repeat blood pressure check.  Your blood pressure is elevated.  We have restarted the carvedilol and Norvasc.  Try to check your blood pressure at least once or twice a week.  The goal is 130/80 or lower.  Continue to limit salt in the foods.  Congratulations on quitting smoking.  Try to remain free of tobacco.

## 2018-09-03 NOTE — Progress Notes (Signed)
Patient ID: Brian Sale., male    DOB: 1965-07-23  MRN: [id number]  CC: Hypertension   Subjective: Brian Moody is a 53 y.o. male who presents for chronic ds management.  Last seen in January 2019.  Patient states that he did not follow-up because he was trying to catch up on some bills His concerns today include:  Pt with hx of A.fib, HTN, tob dep, chronic LBP, LT shoulder OA and RT hip pain  A.fib/HTN: out of meds for 5-6 mths.  Seen in ER recently for med refills.  They refilled carvedilol.  They did not refill amlodipine. Endorses HA/dizziness.  Little SOB.  Did have some swelling in feet when he went to the ER. Sleeps on 2 pillows.  No PND/orthopnea Tries to limit salt  HL:  Needing refill on Lipitor  Tobacco dependence: stopped smoking 3 days ago.   Patient Active Problem List   Diagnosis Date Noted  . Midline low back pain without sciatica 03/29/2016  . Obesity (BMI 30-39.9) 03/29/2016  . Heel pain, bilateral 07/20/2015  . Essential hypertension   . Tobacco abuse   . Persistent atrial fibrillation   . Pre-diabetes   . Vitamin D insufficiency 08/24/2013  . Smoking 08/24/2013  . Other and unspecified hyperlipidemia 08/24/2013  . Avulsion fracture of distal fibula 06/02/2013  . Smoker 12/30/2012  . Generalized headaches 12/30/2012     Current Outpatient Medications on File Prior to Visit  Medication Sig Dispense Refill  . acetaminophen (TYLENOL) 500 MG tablet Take 1,000 mg by mouth every 6 (six) hours as needed for headache.    . albuterol (PROVENTIL HFA;VENTOLIN HFA) 108 (90 BASE) MCG/ACT inhaler Inhale 1-2 puffs into the lungs every 6 (six) hours as needed for wheezing. (Patient not taking: Reported on 09/01/2018) 1 Inhaler 0   No current facility-administered medications on file prior to visit.     No Known Allergies  Social History   Socioeconomic History  . Marital status: Married    Spouse name: Not on file  . Number of children: Not on file   . Years of education: Not on file  . Highest education level: Not on file  Occupational History  . Not on file  Social Needs  . Financial resource strain: Not on file  . Food insecurity:    Worry: Not on file    Inability: Not on file  . Transportation needs:    Medical: Not on file    Non-medical: Not on file  Tobacco Use  . Smoking status: Current Every Day Smoker    Packs/day: 0.20    Types: Cigarettes  . Smokeless tobacco: Never Used  Substance and Sexual Activity  . Alcohol use: No  . Drug use: No  . Sexual activity: Not on file  Lifestyle  . Physical activity:    Days per week: Not on file    Minutes per session: Not on file  . Stress: Not on file  Relationships  . Social connections:    Talks on phone: Not on file    Gets together: Not on file    Attends religious service: Not on file    Active member of club or organization: Not on file    Attends meetings of clubs or organizations: Not on file    Relationship status: Not on file  . Intimate partner violence:    Fear of current or ex partner: Not on file    Emotionally abused: Not on file  Physically abused: Not on file    Forced sexual activity: Not on file  Other Topics Concern  . Not on file  Social History Narrative  . Not on file    Family History  Problem Relation Age of Onset  . Hypertension Father   . Diabetes Maternal Grandmother   . Hypertension Maternal Grandmother   . Diabetes Maternal Grandfather   . Hypertension Maternal Grandfather     No past surgical history on file.  ROS: Review of Systems Negative except as stated above  PHYSICAL EXAM: BP (!) 171/129   Pulse 87   Temp 98.2 F (36.8 C) (Oral)   Resp 16   Ht  (1.778 m)   Wt 236 lb 9.6 oz (107.3 kg)   SpO2 95%   BMI 33.95 kg/m   Physical Exam  General appearance - alert, well appearing, middle-aged older African-American male and in no distress Mental status - normal mood, behavior, speech, dress, motor  activity, and thought processes Neck - supple, no significant adenopathy Chest - clear to auscultation, no wheezes, rales or rhonchi, symmetric air entry Heart -heart rate is irregularly irregular but rate controlled.  Murmurs.  No JVD. Extremities - peripheral pulses normal, no pedal edema, no clubbing or cyanosis  CMP Latest Ref Rng & Units 09/01/2018 07/21/2017 04/18/2016  Glucose 70 - 99 mg/dL 85 82 161(W)  BUN 6 - 20 mg/dL Creatinine 0.61 - 1.24 mg/dL 9.60 4.54 0.98  Sodium 135 - 145 mmol/L 138 142 142  Potassium 3.5 - 5.1 mmol/L 3.1(L) 4.2 3.3(L)  Chloride 98 - 111 mmol/L 105 101 102  CO2 22 - 32 mmol/L 23 26 -  Calcium 8.9 - 10.3 mg/dL 8.9 9.6 -  Total Protein 6.0 - 8.5 g/dL - 7.5 -  Total Bilirubin 0.0 - 1.2 mg/dL - 0.4 -  Alkaline Phos 39 - 117 IU/L - 79 -  AST 0 - 40 IU/L - 19 -  ALT 0 - 44 IU/L - 16 -   Lipid Panel     Component Value Date/Time   CHOL 193 07/21/2017 1159   TRIG 104 07/21/2017 1159   HDL 45 07/21/2017 1159   CHOLHDL 4.3 07/21/2017 1159   CHOLHDL 5.1 06/23/2015 0406   VLDL 21 06/23/2015 0406   LDLCALC 127 (H) 07/21/2017 1159    CBC    Component Value Date/Time   WBC 9.5 09/01/2018 1624   RBC 4.96 09/01/2018 1624   HGB 14.1 09/01/2018 1624   HGB 16.6 07/21/2017 1159   HCT 45.0 09/01/2018 1624   HCT 48.9 07/21/2017 1159   PLT 218 09/01/2018 1624   PLT 230 07/21/2017 1159   MCV 90.7 09/01/2018 1624   MCV 87 07/21/2017 1159   MCH 28.4 09/01/2018 1624   MCHC 31.3 09/01/2018 1624   RDW 14.3 09/01/2018 1624   RDW 14.4 07/21/2017 1159   LYMPHSABS 3.1 07/01/2014 1803   MONOABS 1.0 07/01/2014 1803   EOSABS 0.1 07/01/2014 1803   BASOSABS 0.0 07/01/2014 1803    ASSESSMENT AND PLAN: 1. Essential hypertension Not at goal.  Refill amlodipine and carvedilol.  DASH diet encouraged. Follow-up with clinical pharmacist in 2 weeks for repeat blood pressure check.  If not at goal, we can increase the carvedilol. - amLODipine (NORVASC) 10 MG  tablet; Take 1 tablet (10 mg total) by mouth daily.  Dispense: 30 tablet; Refill: 6 - carvedilol (COREG) 12.5 MG tablet; Take 1 tablet (12.5 mg total) by mouth 2 (two) times  daily with a meal.  Dispense: 60 tablet; Refill: 6  2. Chronic atrial fibrillation Rate control on auscultation. Recent CBC done through the emergency room was okay.  Refill Eliquis - carvedilol (COREG) 12.5 MG tablet; Take 1 tablet (12.5 mg total) by mouth 2 (two) times daily with a meal.  Dispense: 60 tablet; Refill: 6 - apixaban (ELIQUIS) 5 MG TABS tablet; Take 1 tablet (5 mg total) by mouth 2 (two) times daily.  Dispense: 60 tablet; Refill: 6  3. Tobacco dependence Congratulated him on quitting.  He does not feel that he needs anything at this time for cravings.  Encouraged him to remain tobacco free.  Less than 5 minutes spent on counseling.  4. Other hyperlipidemia Patient declines blood draw today for cholesterol check and LFTs.  He states he will do them on his next visit. - atorvastatin (LIPITOR) 10 MG tablet; Take 1 tablet (10 mg total) by mouth daily.  Dispense: 30 tablet; Refill: 6  5. Screening for colon cancer Discussed colon cancer screening and methods.  He is willing to do the fit test. - Fecal occult blood, imunochemical([facility])    Patient was given the opportunity to ask questions.  Patient verbalized understanding of the plan and was able to repeat key elements of the plan.   Orders Placed This Encounter  Procedures  . Fecal occult blood, imunochemical([facility])     Requested Prescriptions   Signed Prescriptions Disp Refills  . amLODipine (NORVASC) 10 MG tablet 30 tablet 6    Sig: Take 1 tablet (10 mg total) by mouth daily.  Marland Kitchen atorvastatin (LIPITOR) 10 MG tablet 30 tablet 6    Sig: Take 1 tablet (10 mg total) by mouth daily.  . carvedilol (COREG) 12.5 MG tablet 60 tablet 6    Sig: Take 1 tablet (12.5 mg total) by mouth 2 (two) times daily with a meal.  . apixaban  (ELIQUIS) 5 MG TABS tablet 60 tablet 6    Sig: Take 1 tablet (5 mg total) by mouth 2 (two) times daily.    Return in about 3 months (around 12/04/2018).  Jonah Blue, MD, FACP

## 2018-09-10 ENCOUNTER — Ambulatory Visit (HOSPITAL_COMMUNITY): Payer: Medicare Other | Admitting: Nurse Practitioner

## 2018-09-23 ENCOUNTER — Encounter: Payer: Medicare Other | Admitting: Pharmacist

## 2018-10-08 ENCOUNTER — Inpatient Hospital Stay (HOSPITAL_COMMUNITY): Admission: RE | Admit: 2018-10-08 | Payer: Medicare Other | Source: Ambulatory Visit | Admitting: Nurse Practitioner

## 2018-10-17 MED FILL — ATORVASTATIN 10 MG TABLET: 10 | 30 days supply | Qty: 30 | Fill #1

## 2018-10-17 MED FILL — AMLODIPINE BESYLATE 10 MG T: 10 | 30 days supply | Qty: 30 | Fill #1

## 2018-10-17 MED FILL — ELIQUIS 5 MG TABLET: 5 | 30 days supply | Qty: 60 | Fill #0

## 2018-10-17 MED FILL — CARVEDILOL 12.5 MG TABLET: 12.5 | 30 days supply | Qty: 60 | Fill #0

## 2018-12-10 ENCOUNTER — Ambulatory Visit: Payer: Medicare Other | Attending: Internal Medicine | Admitting: Internal Medicine

## 2018-12-10 ENCOUNTER — Other Ambulatory Visit: Payer: Self-pay

## 2018-12-10 DIAGNOSIS — Z5329 Procedure and treatment not carried out because of patient's decision for other reasons: Secondary | ICD-10-CM

## 2018-12-10 DIAGNOSIS — Z91199 Patient's noncompliance with other medical treatment and regimen due to unspecified reason: Secondary | ICD-10-CM

## 2018-12-10 NOTE — Progress Notes (Signed)
My CMA had spoken with the patient and got him prepped for tele-visit with me.  I called him at 2:11 PM and got voicemail message.  I tried calling him again and got his voicemail.  I left a message for him to give [place] a call back to reschedule his appointment.

## 2018-12-21 MED FILL — ATORVASTATIN 10 MG TABLET: 10 | 30 days supply | Qty: 30 | Fill #2

## 2018-12-21 MED FILL — CARVEDILOL 12.5 MG TABLET: 12.5 | 30 days supply | Qty: 60 | Fill #1

## 2018-12-21 MED FILL — AMLODIPINE BESYLATE 10 MG T: 10 | 30 days supply | Qty: 30 | Fill #2

## 2018-12-21 MED FILL — ELIQUIS 5 MG TABLET: 5 | 30 days supply | Qty: 60 | Fill #1

## 2019-02-18 MED FILL — ATORVASTATIN 10 MG TABLET: 10 | 30 days supply | Qty: 30 | Fill #2

## 2019-02-18 MED FILL — AMLODIPINE BESYLATE 10 MG T: 10 | 30 days supply | Qty: 30 | Fill #2

## 2019-02-26 MED FILL — AMLODIPINE BESYLATE 10 MG T: 10 | 30 days supply | Qty: 30 | Fill #2

## 2019-05-05 MED FILL — CARVEDILOL 12.5 MG TABLET: 12.5 | 30 days supply | Qty: 60 | Fill #1

## 2019-05-07 MED FILL — ATORVASTATIN 10 MG TABLET: 10 | 30 days supply | Qty: 30 | Fill #2

## 2019-05-11 ENCOUNTER — Other Ambulatory Visit: Payer: Self-pay

## 2019-05-11 ENCOUNTER — Ambulatory Visit: Payer: Medicare Other | Attending: Critical Care Medicine | Admitting: Critical Care Medicine

## 2019-05-11 ENCOUNTER — Encounter: Payer: Self-pay | Admitting: Critical Care Medicine

## 2019-05-11 VITALS — BP 138/87 | HR 99 | Temp 98.3°F | Resp 18 | Ht 71.0 in | Wt 248.0 lb

## 2019-05-11 DIAGNOSIS — I10 Essential (primary) hypertension: Secondary | ICD-10-CM | POA: Insufficient documentation

## 2019-05-11 DIAGNOSIS — E7849 Other hyperlipidemia: Secondary | ICD-10-CM | POA: Diagnosis not present

## 2019-05-11 DIAGNOSIS — E559 Vitamin D deficiency, unspecified: Secondary | ICD-10-CM | POA: Insufficient documentation

## 2019-05-11 DIAGNOSIS — Z6830 Body mass index (BMI) 30.0-30.9, adult: Secondary | ICD-10-CM | POA: Insufficient documentation

## 2019-05-11 DIAGNOSIS — I482 Chronic atrial fibrillation, unspecified: Secondary | ICD-10-CM | POA: Diagnosis not present

## 2019-05-11 DIAGNOSIS — Z720 Tobacco use: Secondary | ICD-10-CM | POA: Diagnosis not present

## 2019-05-11 DIAGNOSIS — F17210 Nicotine dependence, cigarettes, uncomplicated: Secondary | ICD-10-CM | POA: Diagnosis not present

## 2019-05-11 DIAGNOSIS — Z1211 Encounter for screening for malignant neoplasm of colon: Secondary | ICD-10-CM | POA: Insufficient documentation

## 2019-05-11 DIAGNOSIS — R7303 Prediabetes: Secondary | ICD-10-CM | POA: Diagnosis not present

## 2019-05-11 DIAGNOSIS — N529 Male erectile dysfunction, unspecified: Secondary | ICD-10-CM | POA: Insufficient documentation

## 2019-05-11 DIAGNOSIS — E669 Obesity, unspecified: Secondary | ICD-10-CM | POA: Diagnosis not present

## 2019-05-11 DIAGNOSIS — Z79899 Other long term (current) drug therapy: Secondary | ICD-10-CM | POA: Insufficient documentation

## 2019-05-11 DIAGNOSIS — Z8249 Family history of ischemic heart disease and other diseases of the circulatory system: Secondary | ICD-10-CM | POA: Diagnosis not present

## 2019-05-11 DIAGNOSIS — Z7901 Long term (current) use of anticoagulants: Secondary | ICD-10-CM | POA: Diagnosis not present

## 2019-05-11 DIAGNOSIS — N521 Erectile dysfunction due to diseases classified elsewhere: Secondary | ICD-10-CM | POA: Insufficient documentation

## 2019-05-11 MED ORDER — TADALAFIL 5 MG PO TABS: 5.0000 mg | ORAL_TABLET | Freq: Every day | ORAL | 0 refills | Status: DC | PRN

## 2019-05-11 MED ORDER — AMLODIPINE BESYLATE 10 MG PO TABS: 10.0000 mg | ORAL_TABLET | Freq: Every day | ORAL | 6 refills | Status: DC

## 2019-05-11 MED ORDER — CARVEDILOL 12.5 MG PO TABS: 12.5000 mg | ORAL_TABLET | Freq: Two times a day (BID) | ORAL | 6 refills | Status: DC

## 2019-05-11 MED ORDER — APIXABAN 5 MG PO TABS: 5.0000 mg | ORAL_TABLET | Freq: Two times a day (BID) | ORAL | 6 refills | Status: DC

## 2019-05-11 MED ORDER — ATORVASTATIN CALCIUM 10 MG PO TABS: 10.0000 mg | ORAL_TABLET | Freq: Every day | ORAL | 6 refills | Status: DC

## 2019-05-11 MED FILL — AMLODIPINE BESYLATE 10 MG T: 10 | 30 days supply | Qty: 30 | Fill #0

## 2019-05-11 MED FILL — ELIQUIS 5 MG TABLET: 5 | 30 days supply | Qty: 60 | Fill #0

## 2019-05-11 NOTE — Assessment & Plan Note (Signed)
Ongoing tobacco use    . Current smoking consumption amount: 2-3 cig /day  . Dicsussion on advise to quit smoking and smoking impacts:  Effects on CV and lung function  Patient's willingness to quit:  Wants to quit  . Methods to quit smoking discussed:  Nicotine replacement best option .  Marland Kitchen Medication management of smoking session drugs discussed: prefer nicotine patch  . Resources provided:  Engineer, manufacturing systems  . Setting quit date  End of this year  . Follow-up arranged  Return 1 month   Time spent counseling the patient:  51min

## 2019-05-11 NOTE — Assessment & Plan Note (Signed)
Question of low testosterone in this patient and also side effects of beta-blocker  Plan will be to prescribe Cialis to use daily as needed for erectile dysfunction and I gave him 10 tablets  We will check a testosterone level as well

## 2019-05-11 NOTE — Assessment & Plan Note (Signed)
Hypertension reasonably well controlled.  I encouraged the patient to remain on amlodipine 10 mg daily along with the Coreg and to follow a hypertensive diet and reeducated him as to this today at this visit

## 2019-05-11 NOTE — Assessment & Plan Note (Signed)
Chronic atrial fibrillation with rate relatively well controlled at this time  Plan will be to refill the Coreg at 12-1/2 mg twice daily.  We will also refill the Eliquis at 5 mg twice daily and provide patient assistance  I have encouraged the patient to obtain another appointment with the atrial fibrillation clinic

## 2019-05-11 NOTE — Patient Instructions (Signed)
We will check vitamin D and testosterone levels along with the rest of your labs today  Refills on your medications were made please stay on all of your medications as prescribed  A prescription for Cialis was sent for erectile dysfunction take as needed prior to intercourse  Please call the atrial fibrillation clinic and get a follow-up appointment with them  Focus on smoking cessation and use nicotine replacement patches for this  Focus on improving your diet as outlined below  You declined a tetanus vaccine today  We will see you back in return in 6 weeks  Patient assistance is offered for Eliquis   Hypertension, Adult High blood pressure (hypertension) is when the force of blood pumping through the arteries is too strong. The arteries are the blood vessels that carry blood from the heart throughout the body. Hypertension forces the heart to work harder to pump blood and may cause arteries to become narrow or stiff. Untreated or uncontrolled hypertension can cause a heart attack, heart failure, a stroke, kidney disease, and other problems. A blood pressure reading consists of a higher number over a lower number. Ideally, your blood pressure should be below 120/80. The first ("top") number is called the systolic pressure. It is a measure of the pressure in your arteries as your heart beats. The second ("bottom") number is called the diastolic pressure. It is a measure of the pressure in your arteries as the heart relaxes. What are the causes? The exact cause of this condition is not known. There are some conditions that result in or are related to high blood pressure. What increases the risk? Some risk factors for high blood pressure are under your control. The following factors may make you more likely to develop this condition:  Smoking.  Having type 2 diabetes mellitus, high cholesterol, or both.  Not getting enough exercise or physical activity.  Being overweight.  Having too  much fat, sugar, calories, or salt (sodium) in your diet.  Drinking too much alcohol. Some risk factors for high blood pressure may be difficult or impossible to change. Some of these factors include:  Having chronic kidney disease.  Having a family history of high blood pressure.  Age. Risk increases with age.  Race. You may be at higher risk if you are African American.  Gender. Men are at higher risk than women before age 54. After age 54, women are at higher risk than men.  Having obstructive sleep apnea.  Stress. What are the signs or symptoms? High blood pressure may not cause symptoms. Very high blood pressure (hypertensive crisis) may cause:  Headache.  Anxiety.  Shortness of breath.  Nosebleed.  Nausea and vomiting.  Vision changes.  Severe chest pain.  Seizures. How is this diagnosed? This condition is diagnosed by measuring your blood pressure while you are seated, with your arm resting on a flat surface, your legs uncrossed, and your feet flat on the floor. The cuff of the blood pressure monitor will be placed directly against the skin of your upper arm at the level of your heart. It should be measured at least twice using the same arm. Certain conditions can cause a difference in blood pressure between your right and left arms. Certain factors can cause blood pressure readings to be lower or higher than normal for a short period of time:  When your blood pressure is higher when you are in a health care provider's office than when you are at home, this is called white  coat hypertension. Most people with this condition do not need medicines.  When your blood pressure is higher at home than when you are in a health care provider's office, this is called masked hypertension. Most people with this condition may need medicines to control blood pressure. If you have a high blood pressure reading during one visit or you have normal blood pressure with other risk  factors, you may be asked to:  Return on a different day to have your blood pressure checked again.  Monitor your blood pressure at home for 1 week or longer. If you are diagnosed with hypertension, you may have other blood or imaging tests to help your health care provider understand your overall risk for other conditions. How is this treated? This condition is treated by making healthy lifestyle changes, such as eating healthy foods, exercising more, and reducing your alcohol intake. Your health care provider may prescribe medicine if lifestyle changes are not enough to get your blood pressure under control, and if:  Your systolic blood pressure is above 130.  Your diastolic blood pressure is above 80. Your personal target blood pressure may vary depending on your medical conditions, your age, and other factors. Follow these instructions at home: Eating and drinking   Eat a diet that is high in fiber and potassium, and low in sodium, added sugar, and fat. An example eating plan is called the DASH (Dietary Approaches to Stop Hypertension) diet. To eat this way: ? Eat plenty of fresh fruits and vegetables. Try to fill one half of your plate at each meal with fruits and vegetables. ? Eat whole grains, such as whole-wheat pasta, brown rice, or whole-grain bread. Fill about one fourth of your plate with whole grains. ? Eat or drink low-fat dairy products, such as skim milk or low-fat yogurt. ? Avoid fatty cuts of meat, processed or cured meats, and poultry with skin. Fill about one fourth of your plate with lean proteins, such as fish, chicken without skin, beans, eggs, or tofu. ? Avoid pre-made and processed foods. These tend to be higher in sodium, added sugar, and fat.  Reduce your daily sodium intake. Most people with hypertension should eat less than 1,500 mg of sodium a day.  Do not drink alcohol if: ? Your health care provider tells you not to drink. ? You are pregnant, may be  pregnant, or are planning to become pregnant.  If you drink alcohol: ? Limit how much you use to:  0-1 drink a day for women.  0-2 drinks a day for men. ? Be aware of how much alcohol is in your drink. In the [place] one drink equals one 12 oz bottle of beer (355 mL), one 5 oz glass of wine (148 mL), or one 1 oz glass of hard liquor (44 mL). Lifestyle   Work with your health care provider to maintain a healthy body weight or to lose weight. Ask what an ideal weight is for you.  Get at least 30 minutes of exercise most days of the week. Activities may include walking, swimming, or biking.  Include exercise to strengthen your muscles (resistance exercise), such as Pilates or lifting weights, as part of your weekly exercise routine. Try to do these types of exercises for 30 minutes at least 3 days a week.  Do not use any products that contain nicotine or tobacco, such as cigarettes, e-cigarettes, and chewing tobacco. If you need help quitting, ask your health care provider.  Monitor your blood pressure  at home as told by your health care provider.  Keep all follow-up visits as told by your health care provider. This is important. Medicines  Take over-the-counter and prescription medicines only as told by your health care provider. Follow directions carefully. Blood pressure medicines must be taken as prescribed.  Do not skip doses of blood pressure medicine. Doing this puts you at risk for problems and can make the medicine less effective.  Ask your health care provider about side effects or reactions to medicines that you should watch for. Contact a health care provider if you:  Think you are having a reaction to a medicine you are taking.  Have headaches that keep coming back (recurring).  Feel dizzy.  Have swelling in your ankles.  Have trouble with your vision. Get help right away if you:  Develop a severe headache or confusion.  Have unusual weakness or numbness.  Feel  faint.  Have severe pain in your chest or abdomen.  Vomit repeatedly.  Have trouble breathing. Summary  Hypertension is when the force of blood pumping through your arteries is too strong. If this condition is not controlled, it may put you at risk for serious complications.  Your personal target blood pressure may vary depending on your medical conditions, your age, and other factors. For most people, a normal blood pressure is less than 120/80.  Hypertension is treated with lifestyle changes, medicines, or a combination of both. Lifestyle changes include losing weight, eating a healthy, low-sodium diet, exercising more, and limiting alcohol. This information is not intended to replace advice given to you by your health care provider. Make sure you discuss any questions you have with your health care provider. Document Released: 06/10/2005 Document Revised: 02/18/2018 Document Reviewed: 02/18/2018 Elsevier Patient Education  2020 [employer].

## 2019-05-11 NOTE — Assessment & Plan Note (Signed)
Prediabetes without evidence of need for medication we will follow-up with a hemoglobin A1c

## 2019-05-11 NOTE — Progress Notes (Signed)
Subjective:    Patient ID: Brian Moody., male    DOB: 11/09/1965, 53 y.o.   MRN: [id number]  This is a 53 year old male seen in return follow-up for hypertension, chronic atrial fibrillation, hyperlipidemia, tobacco use.  Patient was last seen in March of this year.  He is yet to turn his fecal occult blood kit in for testing.  He has previously declined flu vaccine and tetanus vaccine.  He has been out of his blood pressure medicines for 1 week.  He continues to smoke to 3 cigarettes daily.  He is determined to quit smoking this year.  He has had difficulty affording the Eliquis.  The patient does complain of erectile dysfunction has not had testosterone levels checked.  The patient is also not been compliant with a-hypertensive diet.  Past Medical History:  Diagnosis Date  . Avulsion fracture of distal fibula 06/02/2013   Right distal fibula occurred 04/30/2013   . Hypertension   . Persistent atrial fibrillation ([place])    a. newly diagnosed on 05/2015 admission. started on coumadin.   . Pre-diabetes    a. HgA1c 6.3 05/2015  . Tobacco abuse      Family History  Problem Relation Age of Onset  . Hypertension Father   . Diabetes Maternal Grandmother   . Hypertension Maternal Grandmother   . Diabetes Maternal Grandfather   . Hypertension Maternal Grandfather      Social History   Socioeconomic History  . Marital status: Married    Spouse name: Not on file  . Number of children: Not on file  . Years of education: Not on file  . Highest education level: Not on file  Occupational History  . Not on file  Social Needs  . Financial resource strain: Not on file  . Food insecurity    Worry: Not on file    Inability: Not on file  . Transportation needs    Medical: Not on file    Non-medical: Not on file  Tobacco Use  . Smoking status: Current Every Day Smoker    Packs/day: 0.20    Types: Cigarettes  . Smokeless tobacco: Never Used  Substance and Sexual Activity   . Alcohol use: No  . Drug use: No  . Sexual activity: Not Currently  Lifestyle  . Physical activity    Days per week: Not on file    Minutes per session: Not on file  . Stress: Not on file  Relationships  . Social Herbalist on phone: Not on file    Gets together: Not on file    Attends religious service: Not on file    Active member of club or organization: Not on file    Attends meetings of clubs or organizations: Not on file    Relationship status: Not on file  . Intimate partner violence    Fear of current or ex partner: Not on file    Emotionally abused: Not on file    Physically abused: Not on file    Forced sexual activity: Not on file  Other Topics Concern  . Not on file  Social History Narrative  . Not on file     No Known Allergies   Outpatient Medications Prior to Visit  Medication Sig Dispense Refill  . acetaminophen (TYLENOL) 500 MG tablet Take 1,000 mg by mouth every 6 (six) hours as needed for headache.    . albuterol (PROVENTIL HFA;VENTOLIN HFA) 108 (90 BASE) MCG/ACT inhaler Inhale 1-2  puffs into the lungs every 6 (six) hours as needed for wheezing. 1 Inhaler 0  . amLODipine (NORVASC) 10 MG tablet Take 1 tablet (10 mg total) by mouth daily. 30 tablet 6  . apixaban (ELIQUIS) 5 MG TABS tablet Take 1 tablet (5 mg total) by mouth 2 (two) times daily. 60 tablet 6  . atorvastatin (LIPITOR) 10 MG tablet Take 1 tablet (10 mg total) by mouth daily. 30 tablet 6  . carvedilol (COREG) 12.5 MG tablet Take 1 tablet (12.5 mg total) by mouth 2 (two) times daily with a meal. 60 tablet 6   No facility-administered medications prior to visit.     Review of Systems Constitutional:   No  weight loss, night sweats,  Fevers, chills, fatigue, lassitude. HEENT:   No headaches,  Difficulty swallowing,  Tooth/dental problems,  Sore throat,                No sneezing, itching, ear ache, nasal congestion, post nasal drip,   CV:  No chest pain,  Orthopnea, PND, swelling in  lower extremities, anasarca, dizziness, palpitations  GI  No heartburn, indigestion, abdominal pain, nausea, vomiting, diarrhea, change in bowel habits, loss of appetite  Resp: No shortness of breath with exertion or at rest.  No excess mucus, no productive cough,  No non-productive cough,  No coughing up of blood.  No change in color of mucus.  No wheezing.  No chest wall deformity  Skin: no rash or lesions.  GU: no dysuria, change in color of urine, no urgency or frequency.  No flank pain. ERECTILE DYSFUNCTION  MS:  No joint pain or swelling.  No decreased range of motion.  No back pain.  Psych:  No change in mood or affect. No depression or anxiety.  No memory loss.     Objective:   Physical Exam  Vitals:   05/11/19 0854  BP: 138/87  Pulse: 99  Resp: 18  Temp: 98.3 F (36.8 C)  TempSrc: Oral  SpO2: 95%  Weight: 248 lb (112.5 kg)  Height: '5\' 11"'  (1.803 m)    Gen: Pleasant, obese, in no distress,  normal affect  ENT: No lesions,  mouth clear,  oropharynx clear, no postnasal drip  Neck: No JVD, no TMG, no carotid bruits  Lungs: No use of accessory muscles, no dullness to percussion, clear without rales or rhonchi  Cardiovascular: RRR, heart sounds normal, no murmur or gallops, no peripheral edema  Abdomen: soft and NT, no HSM,  BS normal  Musculoskeletal: No deformities, no cyanosis or clubbing  Neuro: alert, non focal  Skin: Warm, no lesions or rashes       Assessment & Plan:  I personally reviewed all images and lab data in the [facility] system as well as any outside material available during this office visit and agree with the  radiology impressions.   Chronic atrial fibrillation Chronic atrial fibrillation with rate relatively well controlled at this time  Plan will be to refill the Coreg at 12-1/2 mg twice daily.  We will also refill the Eliquis at 5 mg twice daily and provide patient assistance  I have encouraged the patient to obtain another appointment  with the atrial fibrillation clinic  Essential hypertension Hypertension reasonably well controlled.  I encouraged the patient to remain on amlodipine 10 mg daily along with the Coreg and to follow a hypertensive diet and reeducated him as to this today at this visit  Erectile disorder due to medical condition in male Question of  low testosterone in this patient and also side effects of beta-blocker  Plan will be to prescribe Cialis to use daily as needed for erectile dysfunction and I gave him 10 tablets  We will check a testosterone level as well  Obesity (BMI 30-39.9) With ongoing obesity I educated the patient as to the proper hypertensive diet for weight loss and salt control  Pre-diabetes Prediabetes without evidence of need for medication we will follow-up with a hemoglobin A1c  Tobacco abuse Ongoing tobacco use    . Current smoking consumption amount: 2-3 cig /day  . Dicsussion on advise to quit smoking and smoking impacts:  Effects on CV and lung function  Patient's willingness to quit:  Wants to quit  . Methods to quit smoking discussed:  Nicotine replacement best option .  Marland Kitchen Medication management of smoking session drugs discussed: prefer nicotine patch  . Resources provided:  Engineer, manufacturing systems  . Setting quit date  End of this year  . Follow-up arranged  Return 1 month   Time spent counseling the patient:  40mn    PDoren Custardwas seen today for blood pressure check.  Diagnoses and all orders for this visit:  Essential hypertension -     Comprehensive metabolic panel -     CBC with Differential/Platelet; Future -     amLODipine (NORVASC) 10 MG tablet; Take 1 tablet (10 mg total) by mouth daily. -     carvedilol (COREG) 12.5 MG tablet; Take 1 tablet (12.5 mg total) by mouth 2 (two) times daily with a meal. -     CBC with Differential/Platelet  Pre-diabetes -     Hemoglobin A1c  Tobacco abuse  Obesity (BMI 30-39.9)  Other hyperlipidemia -      atorvastatin (LIPITOR) 10 MG tablet; Take 1 tablet (10 mg total) by mouth daily.  Vitamin D insufficiency -     Vitamin D, 25-hydroxy  Chronic atrial fibrillation (HCC) -     apixaban (ELIQUIS) 5 MG TABS tablet; Take 1 tablet (5 mg total) by mouth 2 (two) times daily. -     carvedilol (COREG) 12.5 MG tablet; Take 1 tablet (12.5 mg total) by mouth 2 (two) times daily with a meal.  Colon cancer screening -     Cancel: Fecal occult blood, imunochemical  Erectile disorder due to medical condition in male -     Testosterone  Other orders -     tadalafil (CIALIS) 5 MG tablet; Take 1 tablet (5 mg total) by mouth daily as needed for erectile dysfunction.

## 2019-05-11 NOTE — Assessment & Plan Note (Signed)
With ongoing obesity I educated the patient as to the proper hypertensive diet for weight loss and salt control

## 2019-05-12 LAB — COMPREHENSIVE METABOLIC PANEL
ALT: 20 IU/L (ref 0–44)
AST: 18 IU/L (ref 0–40)
Albumin/Globulin Ratio: 1.3 (ref 1.2–2.2)
Albumin: 4 g/dL (ref 3.8–4.9)
Alkaline Phosphatase: 92 IU/L (ref 39–117)
BUN/Creatinine Ratio: 7 — ABNORMAL LOW (ref 9–20)
BUN: 8 mg/dL (ref 6–24)
Bilirubin Total: 0.2 mg/dL (ref 0.0–1.2)
CO2: 25 mmol/L (ref 20–29)
Calcium: 9.3 mg/dL (ref 8.7–10.2)
Chloride: 103 mmol/L (ref 96–106)
Creatinine, Ser: 1.07 mg/dL (ref 0.76–1.27)
GFR calc Af Amer: 90 mL/min/{1.73_m2} (ref >59)
GFR calc non Af Amer: 78 mL/min/{1.73_m2} (ref >59)
Globulin, Total: 3 g/dL (ref 1.5–4.5)
Glucose: 104 mg/dL — ABNORMAL HIGH (ref 65–99)
Potassium: 4.5 mmol/L (ref 3.5–5.2)
Sodium: 143 mmol/L (ref 134–144)
Total Protein: 7 g/dL (ref 6.0–8.5)

## 2019-05-12 LAB — TESTOSTERONE: Testosterone: 514 ng/dL (ref 264–916)

## 2019-05-12 LAB — HEMOGLOBIN A1C
Est. average glucose Bld gHb Est-mCnc: 143 mg/dL
Hgb A1c MFr Bld: 6.6 % — ABNORMAL HIGH (ref 4.8–5.6)

## 2019-05-12 LAB — VITAMIN D 25 HYDROXY (VIT D DEFICIENCY, FRACTURES): Vit D, 25-Hydroxy: 10.3 ng/mL — ABNORMAL LOW (ref 30.0–100.0)

## 2019-05-13 ENCOUNTER — Other Ambulatory Visit: Payer: Self-pay | Admitting: Critical Care Medicine

## 2019-05-13 DIAGNOSIS — E559 Vitamin D deficiency, unspecified: Secondary | ICD-10-CM

## 2019-05-13 MED ORDER — VITAMIN D (ERGOCALCIFEROL) 1.25 MG (50000 UNIT) PO CAPS: 50000.0000 [IU] | ORAL_CAPSULE | ORAL | 2 refills | Status: DC

## 2019-05-13 NOTE — Progress Notes (Signed)
Vit D levels quite low.  Ordered vit D 50000 weekly

## 2019-05-15 LAB — CBC WITH DIFFERENTIAL/PLATELET

## 2019-05-15 LAB — SPECIMEN STATUS REPORT

## 2019-05-17 ENCOUNTER — Telehealth: Payer: Self-pay | Admitting: *Deleted

## 2019-05-17 NOTE — Telephone Encounter (Signed)
-----   Message from Elsie Stain, MD sent at 05/13/2019  1:36 PM EST ----- Let mr Sharpnack know  CMET normal.  Testosterone level normal   Ok to try cialis   Vit D level quite low.  Hgb A1C slightly elevated.  Does not need medication at present.   I recommend Vit D to start at 50,000 units weekly,  Rx sent to our pharmacy

## 2019-05-17 NOTE — Telephone Encounter (Signed)
Medical Assistant left message on patient's home and cell voicemail. Voicemail states to give a call back to [place] with [facility] at [phone]. Patient is aware of levels being normal except for vitamin d being low and A1C being elevated. Patient advised to take weekly vitamin d supplement and limit sugar intake during this time.

## 2019-06-23 ENCOUNTER — Ambulatory Visit: Payer: Medicare Other | Admitting: Critical Care Medicine

## 2019-06-24 ENCOUNTER — Encounter (HOSPITAL_COMMUNITY): Payer: Self-pay

## 2019-06-24 ENCOUNTER — Other Ambulatory Visit: Payer: Self-pay

## 2019-06-24 ENCOUNTER — Ambulatory Visit (HOSPITAL_COMMUNITY)
Admission: EM | Admit: 2019-06-24 | Discharge: 2019-06-24 | Disposition: A | Discharge status: Home / Self Care | Payer: Medicare Other | Attending: Family Medicine | Admitting: Family Medicine

## 2019-06-24 DIAGNOSIS — H00014 Hordeolum externum left upper eyelid: Secondary | ICD-10-CM

## 2019-06-24 MED ORDER — HYDROCODONE-ACETAMINOPHEN 7.5-325 MG PO TABS: 1.0000 | ORAL_TABLET | Freq: Four times a day (QID) | ORAL | 0 refills | Status: DC | PRN

## 2019-06-24 MED ORDER — CLINDAMYCIN HCL 300 MG PO CAPS: 300.0000 mg | ORAL_CAPSULE | Freq: Three times a day (TID) | ORAL | 0 refills | Status: DC

## 2019-06-24 MED FILL — CLINDAMYCIN HCL 300 MG CAPS: 300 | 5 days supply | Qty: 15 | Fill #0

## 2019-06-24 MED FILL — HYDROCODON-APAP 7.5-325: 7.5-325 | 4 days supply | Qty: 15 | Fill #0

## 2019-06-24 NOTE — ED Provider Notes (Signed)
[facility]    CSN: [id number] Arrival date & time: 06/24/19  1259      History   Chief Complaint Chief Complaint  Patient presents with  . eyelid infection    HPI Brian Moody. is a 53 y.o. male.   HPI  Patient has had an infection in his left eyelid worsening for the last 2 to 3 days.  He is using warm compresses.  He has pus draining out of his eye.  In spite of this he has a very painful eye, he can hardly touch the eye, he states it shoots pain into his upper eye every time he tries to blink.  He is light sensitive. No trauma or injury This is happened before.  Usually with warm compresses he can avoid coming to the doctor Patient does have hypertension, hyperlipidemia, prediabetes, obesity, and chronic atrial fibrillation requiring anticoagulation with Eliquis.  Past Medical History:  Diagnosis Date  . Avulsion fracture of distal fibula 06/02/2013   Right distal fibula occurred 04/30/2013   . Hypertension   . Persistent atrial fibrillation ([facility])    a. newly diagnosed on 05/2015 admission. started on coumadin.   . Pre-diabetes    a. HgA1c 6.3 05/2015  . Tobacco abuse     Patient Active Problem List   Diagnosis Date Noted  . Erectile disorder due to medical condition in male 05/11/2019  . Chronic atrial fibrillation ([facility]) 09/03/2018  . Midline low back pain without sciatica 03/29/2016  . Obesity (BMI 30-39.9) 03/29/2016  . Heel pain, bilateral 07/20/2015  . Essential hypertension   . Tobacco abuse   . Pre-diabetes   . Vitamin D insufficiency 08/24/2013  . Other hyperlipidemia 08/24/2013    History reviewed. No pertinent surgical history.     Home Medications    Prior to Admission medications   Medication Sig Start Date End Date Taking? Authorizing Provider  acetaminophen (TYLENOL) 500 MG tablet Take 1,000 mg by mouth every 6 (six) hours as needed for headache.    [provider]  albuterol (PROVENTIL HFA;VENTOLIN HFA) 108  (90 BASE) MCG/ACT inhaler Inhale 1-2 puffs into the lungs every 6 (six) hours as needed for wheezing. 07/01/14   Rolland Porter, MD  amLODipine (NORVASC) 10 MG tablet Take 1 tablet (10 mg total) by mouth daily. 05/11/19   Storm Frisk, MD  apixaban (ELIQUIS) 5 MG TABS tablet Take 1 tablet (5 mg total) by mouth 2 (two) times daily. 05/11/19   Storm Frisk, MD  atorvastatin (LIPITOR) 10 MG tablet Take 1 tablet (10 mg total) by mouth daily. 05/11/19   Storm Frisk, MD  carvedilol (COREG) 12.5 MG tablet Take 1 tablet (12.5 mg total) by mouth 2 (two) times daily with a meal. 05/11/19   Storm Frisk, MD  clindamycin (CLEOCIN) 300 MG capsule Take 1 capsule (300 mg total) by mouth 3 (three) times daily. 06/24/19   Eustace Moore, MD  HYDROcodone-acetaminophen (NORCO) 7.5-325 MG tablet Take 1 tablet by mouth every 6 (six) hours as needed for moderate pain. 06/24/19   Eustace Moore, MD  tadalafil (CIALIS) 5 MG tablet Take 1 tablet (5 mg total) by mouth daily as needed for erectile dysfunction. 05/11/19   Storm Frisk, MD  Vitamin D, Ergocalciferol, (DRISDOL) 1.25 MG (50000 UT) CAPS capsule Take 1 capsule (50,000 Units total) by mouth every 7 (seven) days. 05/13/19   Storm Frisk, MD    Family History Family History  Problem Relation Age  of Onset  . Hypertension Father   . Diabetes Maternal Grandmother   . Hypertension Maternal Grandmother   . Diabetes Maternal Grandfather   . Hypertension Maternal Grandfather     Social History Social History   Tobacco Use  . Smoking status: Current Every Day Smoker    Packs/day: 0.20    Types: Cigarettes  . Smokeless tobacco: Never Used  Substance Use Topics  . Alcohol use: No  . Drug use: No     Allergies   Patient has no known allergies.   Review of Systems Review of Systems  Constitutional: Negative for chills and fever.  HENT: Negative for congestion and hearing loss.   Eyes: Positive for photophobia, pain,  discharge and redness. Negative for itching and visual disturbance.  Respiratory: Negative for cough and shortness of breath.   Cardiovascular: Negative for chest pain and leg swelling.  Gastrointestinal: Negative for abdominal pain, constipation and diarrhea.  Genitourinary: Negative for dysuria and frequency.  Musculoskeletal: Negative for myalgias.  Neurological: Negative for dizziness, seizures and headaches.  Psychiatric/Behavioral: The patient is not nervous/anxious.      Physical Exam Triage Vital Signs ED Triage Vitals  Enc Vitals Group     BP 06/24/19 1403 119/87     Pulse Rate 06/24/19 1403 92     Resp 06/24/19 1403 18     Temp 06/24/19 1403 98.7 F (37.1 C)     Temp Source 06/24/19 1403 Oral     SpO2 06/24/19 1403 100 %     Weight 06/24/19 1353 285 lb (129.3 kg)     Height --      Head Circumference --      Peak Flow --      Pain Score 06/24/19 1353 6     Pain Loc --      Pain Edu? --      Excl. in GC? --    No data found.  No visual impairment reported  Updated Vital Signs BP 119/87 (BP Location: Right Arm)   Pulse 92   Temp 98.7 F (37.1 C) (Oral)   Resp 18   Wt 129.3 kg   SpO2 100%   BMI 39.75 kg/m        Physical Exam Constitutional:      General: He is not in acute distress.    Appearance: He is well-developed.  HENT:     Head: Normocephalic and atraumatic.  Eyes:     Conjunctiva/sclera: Conjunctivae normal.     Pupils: Pupils are equal, round, and reactive to light.   Cardiovascular:     Rate and Rhythm: Normal rate.  Pulmonary:     Effort: Pulmonary effort is normal. No respiratory distress.  Abdominal:     General: There is no distension.     Palpations: Abdomen is soft.  Musculoskeletal:        General: Normal range of motion.     Cervical back: Normal range of motion.  Skin:    General: Skin is warm and dry.  Neurological:     General: No focal deficit present.     Mental Status: He is alert.  Psychiatric:        Mood and  Affect: Mood normal.        Behavior: Behavior normal.      UC Treatments / Results  Labs (all labs ordered are listed, but only abnormal results are displayed) Labs Reviewed - No data to display  EKG   Radiology No results found.  Procedures Procedures (including critical care time)  Medications Ordered in UC Medications - No data to display  Initial Impression / Assessment and Plan / UC Course  I have reviewed the triage vital signs and the nursing notes.  Pertinent labs & imaging results that were available during my care of the patient were reviewed by me and considered in my medical decision making (see chart for details).     Discussed management Need to follow up with EYE specialist Final Clinical Impressions(s) / UC Diagnoses   Final diagnoses:  Hordeolum externum of left upper eyelid     Discharge Instructions     Take antibiotic 3 x a day Take 2 doses today Continue warm compresses Massage as tolerated Wash hands often Take pain medicine as needed - do not drive on the hydrocodone   ED Prescriptions    Medication Sig Dispense Auth. Provider   HYDROcodone-acetaminophen (NORCO) 7.5-325 MG tablet Take 1 tablet by mouth every 6 (six) hours as needed for moderate pain. 15 tablet Raylene Everts, MD   clindamycin (CLEOCIN) 300 MG capsule Take 1 capsule (300 mg total) by mouth 3 (three) times daily. 15 capsule Raylene Everts, MD     I have reviewed the PDMP during this encounter.   Raylene Everts, MD 06/24/19 [id number]

## 2019-06-24 NOTE — ED Triage Notes (Signed)
Pt states he has eyelid discomfort. Pt states this has happened before.  Pt is not sure what causes this issue.

## 2019-06-24 NOTE — Discharge Instructions (Signed)
Take antibiotic 3 x a day Take 2 doses today Continue warm compresses Massage as tolerated Wash hands often Take pain medicine as needed - do not drive on the hydrocodone

## 2019-06-28 ENCOUNTER — Ambulatory Visit: Payer: Medicare Other | Admitting: Critical Care Medicine

## 2019-06-29 ENCOUNTER — Ambulatory Visit: Payer: Medicare Other | Attending: Critical Care Medicine | Admitting: Critical Care Medicine

## 2019-06-29 ENCOUNTER — Encounter: Payer: Self-pay | Admitting: Critical Care Medicine

## 2019-06-29 ENCOUNTER — Other Ambulatory Visit: Payer: Self-pay

## 2019-06-29 DIAGNOSIS — H00014 Hordeolum externum left upper eyelid: Secondary | ICD-10-CM | POA: Diagnosis not present

## 2019-06-29 DIAGNOSIS — Z720 Tobacco use: Secondary | ICD-10-CM

## 2019-06-29 DIAGNOSIS — E7849 Other hyperlipidemia: Secondary | ICD-10-CM | POA: Diagnosis not present

## 2019-06-29 DIAGNOSIS — I482 Chronic atrial fibrillation, unspecified: Secondary | ICD-10-CM

## 2019-06-29 DIAGNOSIS — M79671 Pain in right foot: Secondary | ICD-10-CM

## 2019-06-29 DIAGNOSIS — I10 Essential (primary) hypertension: Secondary | ICD-10-CM | POA: Diagnosis not present

## 2019-06-29 DIAGNOSIS — R7303 Prediabetes: Secondary | ICD-10-CM

## 2019-06-29 DIAGNOSIS — E559 Vitamin D deficiency, unspecified: Secondary | ICD-10-CM

## 2019-06-29 DIAGNOSIS — M79672 Pain in left foot: Secondary | ICD-10-CM

## 2019-06-29 DIAGNOSIS — N521 Erectile dysfunction due to diseases classified elsewhere: Secondary | ICD-10-CM

## 2019-06-29 DIAGNOSIS — E669 Obesity, unspecified: Secondary | ICD-10-CM

## 2019-06-29 HISTORY — DX: Hordeolum externum left upper eyelid: H00.014

## 2019-06-29 MED ORDER — 5 SERIES BP MONITOR DEVI: 1.0000 [IU] | Freq: Every day | 0 refills | Status: AC

## 2019-06-29 MED ORDER — BLOOD GLUCOSE MONITOR KIT: PACK | 0 refills | Status: AC

## 2019-06-29 MED ORDER — SILDENAFIL CITRATE 50 MG PO TABS: 50.0000 mg | ORAL_TABLET | Freq: Every day | ORAL | 0 refills | Status: DC | PRN

## 2019-06-29 NOTE — Assessment & Plan Note (Signed)
Significant eye infection in the left upper eyelid now on clindamycin and oral pain medicines.  I would like for an ophthalmology follow-up and because he is prediabetic a retinal exam would be in order as well.  Referral to ophthalmology was made

## 2019-06-29 NOTE — Assessment & Plan Note (Signed)
Prediabetes may actually now be type II but diabetes.  His last hemoglobin A1c was 6.6 earlier this year  Plan here will be for the patient to obtain a home glucose monitor and bring him in his numbers he is achieving and will continue the Lipitor daily but will hold off on oral antihyper glycemic agents

## 2019-06-29 NOTE — Assessment & Plan Note (Signed)
  .   Current smoking consumption amount: 1/2 ppd  . Dicsussion on advise to quit smoking and smoking impacts: CV and lung health  . Patient's willingness to quit:  commited to quit in 2021  . Methods to quit smoking discussed:    . Medication management of smoking session drugs discussed: nicotine replacement     . Setting quit date  pending  . Follow-up arranged f/u 6 weeks   Time spent counseling the patient:  5 min

## 2019-06-29 NOTE — Progress Notes (Signed)
Pt states his right ankle gets numb

## 2019-06-29 NOTE — Progress Notes (Signed)
Subjective:    Patient ID: Brian Moody., male    DOB: 1965-11-01, 54 y.o.   MRN: [id number] Virtual Visit via Telephone Note  I connected with Brian Moody. on 06/29/19 at 10:30 AM EST by telephone and verified that I am speaking with the correct person using two identifiers.   Consent:  I discussed the limitations, risks, security and privacy concerns of performing an evaluation and management service by telephone and the availability of in person appointments. I also discussed with the patient that there may be a patient responsible charge related to this service. The patient expressed understanding and agreed to proceed.  Location of patient: The patient was at home  Location of provider: I was in my office  Persons participating in the televisit with the patient.   No one else on the call  History of Present Illness: This is a 54 year old male seen in return follow-up for hypertension, chronic atrial fibrillation, hyperlipidemia, tobacco use.  Patient was last seen in March of this year.  He is yet to turn his fecal occult blood kit in for testing.  He has previously declined flu vaccine and tetanus vaccine.  He has been out of his blood pressure medicines for 1 week.  He continues to smoke to 3 cigarettes daily.  He is determined to quit smoking this year.  He has had difficulty affording the Eliquis.  The patient does complain of erectile dysfunction has not had testosterone levels checked.  The patient is also not been compliant with a-hypertensive diet.   06/29/2019 This patient requested this visit be converted to a telehealth telephone visit.  Since the last visit the patient states his heart rate has been under good control and he is maintaining his Coreg twice daily.  He is also maintaining the Eliquis twice daily as well.  The patient is yet to achieve a blood pressure monitor at home and is not certain what his blood pressure is running indicated to him we  would try to obtain this for him.  Patient also struggles with erectile dysfunction likely on the basis of the beta-blocker he is on.  He states the low-dose Cialis did not help him and would like a change to another product.  Patient still has ongoing obesity however his weight is now down to 240 pounds and he is following a diet.  The patient also was in the urgent care recently with an infection in the left upper eyelid he received antibiotics for this and pain medication.  It was quite severe.  He does not have an ophthalmologist at this time.  The other issue is that this patient is not able to regularly follow his blood sugars and he has been labeled prediabetic.  He states he does have increased urination increased thirst he states he has numbness in the feet and coldness in the feet when he is out in the cold air.  He has sharp pains in the heels and this is been a chronic problem as well.  He is focused on tobacco cessation is decreasing slowly on this but is still smoking quite a bit at this time.  He is taking the vitamin D supplement we prescribed at the last visit when he was found to have significantly low levels of vitamin D    Past Medical History:  Diagnosis Date  . Avulsion fracture of distal fibula 06/02/2013   Right distal fibula occurred 04/30/2013   . Hypertension   . Persistent  atrial fibrillation ([place])    a. newly diagnosed on 05/2015 admission. started on coumadin.   . Pre-diabetes    a. HgA1c 6.3 05/2015  . Tobacco abuse      Family History  Problem Relation Age of Onset  . Hypertension Father   . Diabetes Maternal Grandmother   . Hypertension Maternal Grandmother   . Diabetes Maternal Grandfather   . Hypertension Maternal Grandfather      Social History   Socioeconomic History  . Marital status: Married    Spouse name: Not on file  . Number of children: Not on file  . Years of education: Not on file  . Highest education level: Not on file  Occupational  History  . Not on file  Tobacco Use  . Smoking status: Current Every Day Smoker    Packs/day: 0.20    Types: Cigarettes  . Smokeless tobacco: Never Used  Substance and Sexual Activity  . Alcohol use: No  . Drug use: No  . Sexual activity: Not Currently  Other Topics Concern  . Not on file  Social History Narrative  . Not on file   Social Determinants of Health   Financial Resource Strain:   . Difficulty of Paying Living Expenses: Not on file  Food Insecurity:   . Worried About Charity fundraiser in the Last Year: Not on file  . Ran Out of Food in the Last Year: Not on file  Transportation Needs:   . Lack of Transportation (Medical): Not on file  . Lack of Transportation (Non-Medical): Not on file  Physical Activity:   . Days of Exercise per Week: Not on file  . Minutes of Exercise per Session: Not on file  Stress:   . Feeling of Stress : Not on file  Social Connections:   . Frequency of Communication with Friends and Family: Not on file  . Frequency of Social Gatherings with Friends and Family: Not on file  . Attends Religious Services: Not on file  . Active Member of Clubs or Organizations: Not on file  . Attends Archivist Meetings: Not on file  . Marital Status: Not on file  Intimate Partner Violence:   . Fear of Current or Ex-Partner: Not on file  . Emotionally Abused: Not on file  . Physically Abused: Not on file  . Sexually Abused: Not on file     No Known Allergies   Outpatient Medications Prior to Visit  Medication Sig Dispense Refill  . acetaminophen (TYLENOL) 500 MG tablet Take 1,000 mg by mouth every 6 (six) hours as needed for headache.    . albuterol (PROVENTIL HFA;VENTOLIN HFA) 108 (90 BASE) MCG/ACT inhaler Inhale 1-2 puffs into the lungs every 6 (six) hours as needed for wheezing. 1 Inhaler 0  . amLODipine (NORVASC) 10 MG tablet Take 1 tablet (10 mg total) by mouth daily. 30 tablet 6  . apixaban (ELIQUIS) 5 MG TABS tablet Take 1 tablet (5  mg total) by mouth 2 (two) times daily. 60 tablet 6  . atorvastatin (LIPITOR) 10 MG tablet Take 1 tablet (10 mg total) by mouth daily. 30 tablet 6  . carvedilol (COREG) 12.5 MG tablet Take 1 tablet (12.5 mg total) by mouth 2 (two) times daily with a meal. 60 tablet 6  . clindamycin (CLEOCIN) 300 MG capsule Take 1 capsule (300 mg total) by mouth 3 (three) times daily. 15 capsule 0  . HYDROcodone-acetaminophen (NORCO) 7.5-325 MG tablet Take 1 tablet by mouth every 6 (six)  hours as needed for moderate pain. 15 tablet 0  . Vitamin D, Ergocalciferol, (DRISDOL) 1.25 MG (50000 UT) CAPS capsule Take 1 capsule (50,000 Units total) by mouth every 7 (seven) days. 5 capsule 2  . tadalafil (CIALIS) 5 MG tablet Take 1 tablet (5 mg total) by mouth daily as needed for erectile dysfunction. 10 tablet 0   No facility-administered medications prior to visit.    Review of Systems Constitutional:   No  weight loss, night sweats,  Fevers, chills, fatigue, lassitude. HEENT:   No headaches,  Difficulty swallowing,  Tooth/dental problems,  Sore throat,                No sneezing, itching, ear ache, nasal congestion, post nasal drip,   CV:  No chest pain,  Orthopnea, PND, swelling in lower extremities, anasarca, dizziness, palpitations  GI  No heartburn, indigestion, abdominal pain, nausea, vomiting, diarrhea, change in bowel habits, loss of appetite  Resp: No shortness of breath with exertion or at rest.  No excess mucus, no productive cough,  No non-productive cough,  No coughing up of blood.  No change in color of mucus.  No wheezing.  No chest wall deformity  Skin: no rash or lesions.  GU: no dysuria, change in color of urine, no urgency or frequency.  No flank pain. ERECTILE DYSFUNCTION  MS:  o joint pain or swelling.  No decreased range of motion.  No back pain.  Psych:  No change in mood or affect. No depression or anxiety.  No memory loss.     Objective:   Physical Exam  There were no vitals filed  for this visit.  No exam this was a telephone visit     Assessment & Plan:  I personally reviewed all images and lab data in the [facility] system as well as any outside material available during this office visit and agree with the  radiology impressions.  Below from yet Essential hypertension Difficult to say how well controlled blood pressure is at this time.  Patient is on the amlodipine 10 mg a day along with Coreg 12 and half milligrams twice daily  I will order a blood pressure cuff to be delivered to his home and we will see if we can get Medicare coverage on this  Erectile disorder due to medical condition in male Ongoing erectile dysfunction for this will change to Viagra 50 mg daily as needed  Heel pain, bilateral Chronic bilateral heel pain  The patient does have Medicare may need to consider repeat podiatry referral would like to bring the patient in for an actual exam to make sure there is no arterial flow disruption in the lower extremities  Hordeolum externum left upper eyelid Significant eye infection in the left upper eyelid now on clindamycin and oral pain medicines.  I would like for an ophthalmology follow-up and because he is prediabetic a retinal exam would be in order as well.  Referral to ophthalmology was made  Pre-diabetes Prediabetes may actually now be type II but diabetes.  His last hemoglobin A1c was 6.6 earlier this year  Plan here will be for the patient to obtain a home glucose monitor and bring him in his numbers he is achieving and will continue the Lipitor daily but will hold off on oral antihyper glycemic agents  Tobacco abuse    . Current smoking consumption amount: 1/2 ppd  . Dicsussion on advise to quit smoking and smoking impacts: CV and lung health  . Patient's  willingness to quit:  commited to quit in 2021  . Methods to quit smoking discussed:    . Medication management of smoking session drugs discussed: nicotine replacement      . Setting quit date  pending  . Follow-up arranged f/u 6 weeks   Time spent counseling the patient:  5 min    [name] was seen today for hypertension.  Diagnoses and all orders for this visit:  Hordeolum externum left upper eyelid -     Ambulatory referral to Ophthalmology  Pre-diabetes -     Ambulatory referral to Ophthalmology  Tobacco abuse  Vitamin D insufficiency  Other hyperlipidemia  Essential hypertension  Heel pain, bilateral  Chronic atrial fibrillation (HCC)  Obesity (BMI 30-39.9)  Erectile disorder due to medical condition in male  Other orders -     sildenafil (VIAGRA) 50 MG tablet; Take 1 tablet (50 mg total) by mouth daily as needed for erectile dysfunction. -     Blood Pressure Monitoring (5 SERIES BP MONITOR) DEVI; 1 Units by Does not apply route daily. Measure blood pressure daily -     blood glucose meter kit and supplies KIT; Dispense based on patient and insurance preference. Use up to four times daily as directed. (FOR ICD-9 250.00, 250.01).     Follow Up Instructions:    I discussed the assessment and treatment plan with the patient. The patient was provided an opportunity to ask questions and all were answered. The patient agreed with the plan and demonstrated an understanding of the instructions.   The patient was advised to call back or seek an in-person evaluation if the symptoms worsen or if the condition fails to improve as anticipated.  I provided 30 minutes of non-face-to-face time during this encounter  including  median intraservice time , review of notes, labs, imaging, medications  and explaining diagnosis and management to the patient .    Asencion Noble, MD

## 2019-06-29 NOTE — Assessment & Plan Note (Signed)
Ongoing erectile dysfunction for this will change to Viagra 50 mg daily as needed

## 2019-06-29 NOTE — Assessment & Plan Note (Signed)
Difficult to say how well controlled blood pressure is at this time.  Patient is on the amlodipine 10 mg a day along with Coreg 12 and half milligrams twice daily  I will order a blood pressure cuff to be delivered to his home and we will see if we can get Medicare coverage on this

## 2019-06-29 NOTE — Assessment & Plan Note (Signed)
Chronic bilateral heel pain  The patient does have Medicare may need to consider repeat podiatry referral would like to bring the patient in for an actual exam to make sure there is no arterial flow disruption in the lower extremities

## 2019-08-09 MED FILL — AMLODIPINE BESYLATE 10 MG T: 10 | 30 days supply | Qty: 30 | Fill #1

## 2019-08-09 MED FILL — ATORVASTATIN 10 MG TABLET: 10 | 30 days supply | Qty: 30 | Fill #3

## 2019-08-09 MED FILL — ELIQUIS 5 MG TABLET: 5 | 30 days supply | Qty: 60 | Fill #1

## 2019-08-09 MED FILL — CARVEDILOL 12.5 MG TABLET: 12.5 | 30 days supply | Qty: 60 | Fill #2

## 2019-08-09 MED FILL — VIT D2 1.25 MG (50,000 UNIT: 1.25 MG | 28 days supply | Qty: 4 | Fill #0

## 2019-10-19 MED FILL — ATORVASTATIN 10 MG TABLET: 10 | 30 days supply | Qty: 30 | Fill #0

## 2019-10-19 MED FILL — AMLODIPINE BESYLATE 10 MG T: 10 | 30 days supply | Qty: 30 | Fill #2

## 2019-10-19 MED FILL — VIT D2 1.25 MG (50,000 UNIT: 1.25 MG | 28 days supply | Qty: 4 | Fill #0

## 2020-02-02 ENCOUNTER — Other Ambulatory Visit: Payer: Self-pay | Admitting: Internal Medicine

## 2020-02-02 DIAGNOSIS — I482 Chronic atrial fibrillation, unspecified: Secondary | ICD-10-CM

## 2020-02-02 DIAGNOSIS — I10 Essential (primary) hypertension: Secondary | ICD-10-CM

## 2020-02-02 DIAGNOSIS — E7849 Other hyperlipidemia: Secondary | ICD-10-CM

## 2020-02-02 MED ORDER — APIXABAN 5 MG PO TABS: 5.0000 mg | ORAL_TABLET | Freq: Two times a day (BID) | ORAL | 0 refills | Status: DC

## 2020-02-02 MED ORDER — CARVEDILOL 12.5 MG PO TABS: 12.5000 mg | ORAL_TABLET | Freq: Two times a day (BID) | ORAL | 0 refills | Status: DC

## 2020-02-02 MED ORDER — SILDENAFIL CITRATE 50 MG PO TABS: 50.0000 mg | ORAL_TABLET | Freq: Every day | ORAL | 0 refills | Status: DC | PRN

## 2020-02-02 MED ORDER — ATORVASTATIN CALCIUM 10 MG PO TABS: 10.0000 mg | ORAL_TABLET | Freq: Every day | ORAL | 0 refills | Status: DC

## 2020-02-02 MED ORDER — AMLODIPINE BESYLATE 10 MG PO TABS: 10.0000 mg | ORAL_TABLET | Freq: Every day | ORAL | 0 refills | Status: DC

## 2020-02-02 NOTE — Telephone Encounter (Signed)
Pt request refill  carvedilol (COREG) 12.5 MG tablet amLODipine (NORVASC) 10 MG tablet atorvastatin (LIPITOR) 10 MG tablet apixaban (ELIQUIS) 5 MG TABS tablet sildenafil (VIAGRA) 50 MG tablet  [facility] - [place], [place] - [address] [employer] Phone:  [phone]  Fax:  [phone]

## 2020-02-02 NOTE — Telephone Encounter (Signed)
Courtesy refill provided. Patient scheduled for appt on 03/02/20.

## 2020-02-03 MED FILL — ELIQUIS 5 MG TABLET: 5 | 30 days supply | Qty: 60 | Fill #0

## 2020-02-03 MED FILL — CARVEDILOL 12.5 MG TABLET: 12.5 | 30 days supply | Qty: 60 | Fill #0

## 2020-02-03 MED FILL — ATORVASTATIN 10 MG TABLET: 10 | 30 days supply | Qty: 30 | Fill #0

## 2020-03-02 ENCOUNTER — Encounter: Payer: Self-pay | Admitting: Critical Care Medicine

## 2020-03-02 ENCOUNTER — Other Ambulatory Visit: Payer: Self-pay

## 2020-03-02 ENCOUNTER — Other Ambulatory Visit: Payer: Self-pay | Admitting: Critical Care Medicine

## 2020-03-02 ENCOUNTER — Ambulatory Visit: Payer: Medicare Other | Attending: Critical Care Medicine | Admitting: Critical Care Medicine

## 2020-03-02 VITALS — BP 139/81 | HR 91 | Temp 96.4°F | Resp 20 | Ht 70.0 in | Wt 245.6 lb

## 2020-03-02 DIAGNOSIS — M79671 Pain in right foot: Secondary | ICD-10-CM

## 2020-03-02 DIAGNOSIS — R7303 Prediabetes: Secondary | ICD-10-CM

## 2020-03-02 DIAGNOSIS — I482 Chronic atrial fibrillation, unspecified: Secondary | ICD-10-CM | POA: Diagnosis not present

## 2020-03-02 DIAGNOSIS — F17210 Nicotine dependence, cigarettes, uncomplicated: Secondary | ICD-10-CM

## 2020-03-02 DIAGNOSIS — I10 Essential (primary) hypertension: Secondary | ICD-10-CM

## 2020-03-02 DIAGNOSIS — M79672 Pain in left foot: Secondary | ICD-10-CM

## 2020-03-02 DIAGNOSIS — Z720 Tobacco use: Secondary | ICD-10-CM

## 2020-03-02 DIAGNOSIS — E7849 Other hyperlipidemia: Secondary | ICD-10-CM | POA: Diagnosis not present

## 2020-03-02 DIAGNOSIS — Z1211 Encounter for screening for malignant neoplasm of colon: Secondary | ICD-10-CM

## 2020-03-02 MED ORDER — ATORVASTATIN CALCIUM 10 MG PO TABS: 10.0000 mg | ORAL_TABLET | Freq: Every day | ORAL | 6 refills | Status: DC

## 2020-03-02 MED ORDER — CARVEDILOL 12.5 MG PO TABS: 12.5000 mg | ORAL_TABLET | Freq: Two times a day (BID) | ORAL | 6 refills | Status: DC

## 2020-03-02 MED ORDER — AMLODIPINE BESYLATE 10 MG PO TABS: 10.0000 mg | ORAL_TABLET | Freq: Every day | ORAL | 6 refills | Status: DC

## 2020-03-02 MED ORDER — APIXABAN 5 MG PO TABS: 5.0000 mg | ORAL_TABLET | Freq: Two times a day (BID) | ORAL | 6 refills | Status: DC

## 2020-03-02 MED ORDER — GABAPENTIN 400 MG PO CAPS: 400.0000 mg | ORAL_CAPSULE | Freq: Three times a day (TID) | ORAL | 0 refills | Status: DC

## 2020-03-02 MED FILL — CARVEDILOL 12.5 MG TABLET: 12.5 | 30 days supply | Qty: 60 | Fill #0

## 2020-03-02 MED FILL — GABAPENTIN 400 MG CAPSULE: 400 | 60 days supply | Qty: 180 | Fill #0

## 2020-03-02 MED FILL — ELIQUIS 5 MG TABLET: 5 | 30 days supply | Qty: 60 | Fill #0

## 2020-03-02 MED FILL — AMLODIPINE BESYLATE 10 MG T: 10 | 30 days supply | Qty: 30 | Fill #0

## 2020-03-02 MED FILL — ATORVASTATIN 10 MG TABLET: 10 | 30 days supply | Qty: 30 | Fill #0

## 2020-03-02 NOTE — Patient Instructions (Signed)
Refills on medications given  Trial gabapentin 400mg  three times a day for foot pain  Referral to sports medicine given for foot pain  Focus on reduction in tobacco use  Please obtain a covid vaccine  COVID-19 Vaccine Information can be found at: For questions related to vaccine distribution or appointments, please email [email or web address] or call [phone].   Return to Dr [phone] in follow up in 4 months

## 2020-03-02 NOTE — Assessment & Plan Note (Signed)
Bilateral foot pain with tenderness in the midfoot compatible with plantar fasciitis  I have refilled the gabapentin and will refer patient to sports medicine

## 2020-03-02 NOTE — Progress Notes (Signed)
Subjective:    Patient ID: Brian Sakai., male    DOB: December 14, 1965, 54 y.o.   MRN: [id number]  History of Present Illness: 05/11/19 This is a 54 year old male seen in return follow-up for hypertension, chronic atrial fibrillation, hyperlipidemia, tobacco use.  Patient was last seen in March of this year.  He is yet to turn his fecal occult blood kit in for testing.  He has previously declined flu vaccine and tetanus vaccine.  He has been out of his blood pressure medicines for 1 week.  He continues to smoke to 3 cigarettes daily.  He is determined to quit smoking this year.  He has had difficulty affording the Eliquis.  The patient does complain of erectile dysfunction has not had testosterone levels checked.  The patient is also not been compliant with a-hypertensive diet.   06/29/2019 This patient requested this visit be converted to a telehealth telephone visit.  Since the last visit the patient states his heart rate has been under good control and he is maintaining his Coreg twice daily.  He is also maintaining the Eliquis twice daily as well.  The patient is yet to achieve a blood pressure monitor at home and is not certain what his blood pressure is running indicated to him we would try to obtain this for him.  Patient also struggles with erectile dysfunction likely on the basis of the beta-blocker he is on.  He states the low-dose Cialis did not help him and would like a change to another product.  Patient still has ongoing obesity however his weight is now down to 240 pounds and he is following a diet.  The patient also was in the urgent care recently with an infection in the left upper eyelid he received antibiotics for this and pain medication.  It was quite severe.  He does not have an ophthalmologist at this time.  The other issue is that this patient is not able to regularly follow his blood sugars and he has been labeled prediabetic.  He states he does have increased urination  increased thirst he states he has numbness in the feet and coldness in the feet when he is out in the cold air.  He has sharp pains in the heels and this is been a chronic problem as well.  He is focused on tobacco cessation is decreasing slowly on this but is still smoking quite a bit at this time.  He is taking the vitamin D supplement we prescribed at the last visit when he was found to have significantly low levels of vitamin D  03/02/2020 Patient seen in return follow-up still complains of bilateral ankle and foot pain.  He is having increased falls with this.  He brings paperwork from his insurance company that he had a nurse visit he has a hemoglobin A1c of 5.7  Patient's been compliant with his blood pressure medications and has no other complaints except for some low back discomfort  Past Medical History:  Diagnosis Date  . Avulsion fracture of distal fibula 06/02/2013   Right distal fibula occurred 04/30/2013   . Hypertension   . Persistent atrial fibrillation ([place])    a. newly diagnosed on 05/2015 admission. started on coumadin.   . Pre-diabetes    a. HgA1c 6.3 05/2015  . Tobacco abuse      Family History  Problem Relation Age of Onset  . Hypertension Father   . Diabetes Maternal Grandmother   . Hypertension Maternal Grandmother   .  Diabetes Maternal Grandfather   . Hypertension Maternal Grandfather      Social History   Socioeconomic History  . Marital status: Married    Spouse name: Not on file  . Number of children: Not on file  . Years of education: Not on file  . Highest education level: Not on file  Occupational History  . Not on file  Tobacco Use  . Smoking status: Current Some Day Smoker    Packs/day: 0.20    Types: Cigarettes  . Smokeless tobacco: Never Used  Substance and Sexual Activity  . Alcohol use: No  . Drug use: No  . Sexual activity: Not Currently  Other Topics Concern  . Not on file  Social History Narrative  . Not on file   Social  Determinants of Health   Financial Resource Strain:   . Difficulty of Paying Living Expenses: Not on file  Food Insecurity:   . Worried About Charity fundraiser in the Last Year: Not on file  . Ran Out of Food in the Last Year: Not on file  Transportation Needs:   . Lack of Transportation (Medical): Not on file  . Lack of Transportation (Non-Medical): Not on file  Physical Activity:   . Days of Exercise per Week: Not on file  . Minutes of Exercise per Session: Not on file  Stress:   . Feeling of Stress : Not on file  Social Connections:   . Frequency of Communication with Friends and Family: Not on file  . Frequency of Social Gatherings with Friends and Family: Not on file  . Attends Religious Services: Not on file  . Active Member of Clubs or Organizations: Not on file  . Attends Archivist Meetings: Not on file  . Marital Status: Not on file  Intimate Partner Violence:   . Fear of Current or Ex-Partner: Not on file  . Emotionally Abused: Not on file  . Physically Abused: Not on file  . Sexually Abused: Not on file     No Known Allergies   Outpatient Medications Prior to Visit  Medication Sig Dispense Refill  . acetaminophen (TYLENOL) 500 MG tablet Take 1,000 mg by mouth every 6 (six) hours as needed for headache.    . albuterol (PROVENTIL HFA;VENTOLIN HFA) 108 (90 BASE) MCG/ACT inhaler Inhale 1-2 puffs into the lungs every 6 (six) hours as needed for wheezing. 1 Inhaler 0  . Blood Pressure Monitoring (5 SERIES BP MONITOR) DEVI 1 Units by Does not apply route daily. Measure blood pressure daily 1 each 0  . Cholecalciferol (DIALYVITE VITAMIN D 5000 PO) Take 1 capsule by mouth daily.    . sildenafil (VIAGRA) 50 MG tablet Take 1 tablet (50 mg total) by mouth daily as needed for erectile dysfunction. 10 tablet 0  . amLODipine (NORVASC) 10 MG tablet Take 1 tablet (10 mg total) by mouth daily. 30 tablet 0  . apixaban (ELIQUIS) 5 MG TABS tablet Take 1 tablet (5 mg total)  by mouth 2 (two) times daily. 60 tablet 0  . atorvastatin (LIPITOR) 10 MG tablet Take 1 tablet (10 mg total) by mouth daily. 30 tablet 0  . carvedilol (COREG) 12.5 MG tablet Take 1 tablet (12.5 mg total) by mouth 2 (two) times daily with a meal. 60 tablet 0  . HYDROcodone-acetaminophen (NORCO) 7.5-325 MG tablet Take 1 tablet by mouth every 6 (six) hours as needed for moderate pain. 15 tablet 0  . blood glucose meter kit and supplies KIT Dispense  based on patient and insurance preference. Use up to four times daily as directed. (FOR ICD-9 250.00, 250.01). (Patient not taking: Reported on 03/02/2020) 1 each 0  . clindamycin (CLEOCIN) 300 MG capsule Take 1 capsule (300 mg total) by mouth 3 (three) times daily. (Patient not taking: Reported on 03/02/2020) 15 capsule 0  . Vitamin D, Ergocalciferol, (DRISDOL) 1.25 MG (50000 UT) CAPS capsule Take 1 capsule (50,000 Units total) by mouth every 7 (seven) days. (Patient not taking: Reported on 03/02/2020) 5 capsule 2   No facility-administered medications prior to visit.    Review of Systems Constitutional:   No  weight loss, night sweats,  Fevers, chills, fatigue, lassitude. HEENT:   No headaches,  Difficulty swallowing,  Tooth/dental problems,  Sore throat,                No sneezing, itching, ear ache, nasal congestion, post nasal drip,   CV:  No chest pain,  Orthopnea, PND, swelling in lower extremities, anasarca, dizziness, palpitations  GI  No heartburn, indigestion, abdominal pain, nausea, vomiting, diarrhea, change in bowel habits, loss of appetite  Resp: No shortness of breath with exertion or at rest.  No excess mucus, no productive cough,  No non-productive cough,  No coughing up of blood.  No change in color of mucus.  No wheezing.  No chest wall deformity  Skin: no rash or lesions.  GU: no dysuria, change in color of urine, no urgency or frequency.  No flank pain. ERECTILE DYSFUNCTION  MS:  o joint pain or swelling.  No decreased range of  motion.  No back pain.  Psych:  No change in mood or affect. No depression or anxiety.  No memory loss.     Objective:   Physical Exam  Vitals:   03/02/20 0937  BP: 139/81  Pulse: 91  Resp: 20  Temp: (!) 96.4 F (35.8 C)  TempSrc: Temporal  SpO2: 98%  Weight: 245 lb 9.6 oz (111.4 kg)  Height: '5\' 10"'  (1.778 m)  Gen: Pleasant, well-nourished, in no distress,  normal affect  ENT: No lesions,  mouth clear,  oropharynx clear, no postnasal drip  Neck: No JVD, no TMG, no carotid bruits  Lungs: No use of accessory muscles, no dullness to percussion, clear without rales or rhonchi  Cardiovascular: RRR, heart sounds normal, no murmur or gallops, no peripheral edema  Abdomen: soft and NT, no HSM,  BS normal  Musculoskeletal: No deformities, no cyanosis or clubbing  Neuro: alert, non focal  Skin: Warm, no lesions or rashes        Assessment & Plan:  I personally reviewed all images and lab data in the [facility] system as well as any outside material available during this office visit and agree with the  radiology impressions.  Below from yet Bilateral foot pain Bilateral foot pain with tenderness in the midfoot compatible with plantar fasciitis  I have refilled the gabapentin and will refer patient to sports medicine  Essential hypertension Essential hypertension stable at this time  Continue current medication profile  Pre-diabetes History of prediabetes without full-blown diabetes A1c now 5.7 will monitor with diet and exercise alone  Tobacco abuse    . Current smoking consumption amount: 1/4 pack a day  . Dicsussion on advise to quit smoking and smoking impacts: Cardiovascular health  . Patient's willingness to quit: Willing to consider  . Methods to quit smoking discussed: Behavioral modification  . Medication management of smoking session drugs discussed: None  . Resources  provided:  AVS   . Setting quit date not determined  . Follow-up arranged 2  months   Time spent counseling the patient: 5 minutes    Brian Moody was seen today for follow-up.  Diagnoses and all orders for this visit:  Bilateral foot pain -     Ambulatory referral to Sports Medicine  Essential hypertension -     amLODipine (NORVASC) 10 MG tablet; Take 1 tablet (10 mg total) by mouth daily. -     carvedilol (COREG) 12.5 MG tablet; Take 1 tablet (12.5 mg total) by mouth 2 (two) times daily with a meal.  Other hyperlipidemia -     atorvastatin (LIPITOR) 10 MG tablet; Take 1 tablet (10 mg total) by mouth daily.  Chronic atrial fibrillation (HCC) -     carvedilol (COREG) 12.5 MG tablet; Take 1 tablet (12.5 mg total) by mouth 2 (two) times daily with a meal. -     apixaban (ELIQUIS) 5 MG TABS tablet; Take 1 tablet (5 mg total) by mouth 2 (two) times daily.  Colon cancer screening -     Fecal occult blood, imunochemical  Pre-diabetes  Tobacco abuse  Other orders -     gabapentin (NEURONTIN) 400 MG capsule; Take 1 capsule (400 mg total) by mouth 3 (three) times daily.   All medications were refilled  Send fecal occult kit for cancer screening of the colon

## 2020-03-02 NOTE — Assessment & Plan Note (Signed)
  .   Current smoking consumption amount: 1/4 pack a day  . Dicsussion on advise to quit smoking and smoking impacts: Cardiovascular health  . Patient's willingness to quit: Willing to consider  . Methods to quit smoking discussed: Behavioral modification  . Medication management of smoking session drugs discussed: None  . Resources provided:  AVS   . Setting quit date not determined  . Follow-up arranged 2 months   Time spent counseling the patient: 5 minutes

## 2020-03-02 NOTE — Progress Notes (Signed)
Needs refill on amlodipine   C/o heel spurs x 1 month, worse @ night

## 2020-03-02 NOTE — Assessment & Plan Note (Signed)
Essential hypertension stable at this time  Continue current medication profile

## 2020-03-02 NOTE — Assessment & Plan Note (Signed)
History of prediabetes without full-blown diabetes A1c now 5.7 will monitor with diet and exercise alone

## 2020-03-14 ENCOUNTER — Ambulatory Visit (INDEPENDENT_AMBULATORY_CARE_PROVIDER_SITE_OTHER): Payer: Medicare Other | Admitting: Sports Medicine

## 2020-03-14 ENCOUNTER — Other Ambulatory Visit: Payer: Self-pay

## 2020-03-14 VITALS — BP 136/85 | Ht 70.0 in | Wt 240.0 lb

## 2020-03-14 DIAGNOSIS — B070 Plantar wart: Secondary | ICD-10-CM | POA: Diagnosis not present

## 2020-03-14 DIAGNOSIS — M2140 Flat foot [pes planus] (acquired), unspecified foot: Secondary | ICD-10-CM

## 2020-03-14 DIAGNOSIS — M79671 Pain in right foot: Secondary | ICD-10-CM | POA: Diagnosis not present

## 2020-03-14 DIAGNOSIS — M722 Plantar fascial fibromatosis: Secondary | ICD-10-CM

## 2020-03-14 DIAGNOSIS — M79672 Pain in left foot: Secondary | ICD-10-CM
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
## 2020-03-15 NOTE — Addendum Note (Signed)
Addended by: Reino Bellis R on: 03/15/2020 09:15 AM   Modules accepted: Level of Service

## 2020-05-01 ENCOUNTER — Telehealth: Payer: Self-pay | Admitting: Internal Medicine
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
## 2020-05-02 NOTE — Telephone Encounter (Signed)
Will forward to pcp

## 2020-05-03 NOTE — Telephone Encounter (Signed)
Contacted pt and left a detailed vm informing pt of Dr. Laural Benes message and if he has any questions or concerns to give a call

## 2020-05-04 ENCOUNTER — Other Ambulatory Visit: Payer: Self-pay | Admitting: Critical Care Medicine

## 2020-05-04 MED FILL — CARVEDILOL 12.5 MG TABLET: 12.5 | 30 days supply | Qty: 60 | Fill #1

## 2020-05-04 MED FILL — ELIQUIS 5 MG TABLET: 5 | 30 days supply | Qty: 60 | Fill #1

## 2020-05-04 MED FILL — ATORVASTATIN 10 MG TABLET: 10 | 30 days supply | Qty: 30 | Fill #1

## 2020-05-04 MED FILL — AMLODIPINE BESYLATE 10 MG T: 10 | 30 days supply | Qty: 30 | Fill #1

## 2020-05-05 ENCOUNTER — Ambulatory Visit: Payer: Medicare Other | Attending: Internal Medicine | Admitting: Internal Medicine

## 2020-05-05 ENCOUNTER — Other Ambulatory Visit: Payer: Self-pay

## 2020-05-05 ENCOUNTER — Encounter: Payer: Self-pay | Admitting: Internal Medicine

## 2020-05-05 ENCOUNTER — Other Ambulatory Visit: Payer: Self-pay | Admitting: Internal Medicine

## 2020-05-05 VITALS — BP 133/85 | HR 100 | Resp 16 | Ht 70.5 in | Wt 246.2 lb

## 2020-05-05 DIAGNOSIS — R7303 Prediabetes: Secondary | ICD-10-CM

## 2020-05-05 DIAGNOSIS — I482 Chronic atrial fibrillation, unspecified: Secondary | ICD-10-CM

## 2020-05-05 DIAGNOSIS — M25571 Pain in right ankle and joints of right foot: Secondary | ICD-10-CM | POA: Diagnosis not present

## 2020-05-05 DIAGNOSIS — I10 Essential (primary) hypertension: Secondary | ICD-10-CM | POA: Diagnosis not present

## 2020-05-05 DIAGNOSIS — G8929 Other chronic pain: Secondary | ICD-10-CM

## 2020-05-05 MED ORDER — DICLOFENAC SODIUM 1 % EX GEL: 2.0000 g | Freq: Four times a day (QID) | CUTANEOUS | 2 refills | Status: DC | PRN

## 2020-05-05 MED ORDER — ACETAMINOPHEN 500 MG PO TABS: 1000.0000 mg | ORAL_TABLET | Freq: Four times a day (QID) | ORAL | 2 refills | Status: AC | PRN

## 2020-05-05 MED FILL — DICLOFENAC SODIUM 1% GEL: 1 | 12 days supply | Qty: 100 | Fill #0

## 2020-05-05 NOTE — Patient Instructions (Signed)
I have referred you to orthopedics. Go and have x-ray done at [facility] Radiology department.  Use the Tylenol as needed.  Use the Voltaren gel as needed.

## 2020-05-05 NOTE — Progress Notes (Signed)
Patient ID: Brian Shedd., male    DOB: 1965/08/02  MRN: [id number]  CC: Right ankle pain  Subjective: Brian Moody is a 54 y.o. male who presents for chronic ds management.  He last saw me in March of last year.  He has  been seeing Dr. Joya Gaskins  for his last several visits.   His concerns today include: Pt with hx ofA.fib, HTN, HL, preDM, tob dep, ED, chronic LBP, LT shoulder OA and RT hip pain  C/o problems with RT ankle C/o pain and swelling RT ankle.  Swelling started 6 days ago.  .Pain also started last wkend. Goes and comes.  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  Reportedly broke this ankle 4 times in past none of which required surgery.  Instead each time it was casted.   -currently wearing an ankle brace -soaks foot in warm water and elevates it.  Helps some.  Did not work this past wk.  Referred to sports medicine by Dr. Joya Gaskins on last visit for bilateral foot pain which he thought was due to plantar fasciitis.  Prescribed gabapentin and referred him to sports medicine.  He saw the sports medicine resident.  He recommended insoles with patient did not accept due to financial cost.  He was given some scaphoid pads for the sock liner of his shoes.  Patient states he has found this helpful.  He has not found the gabapentin to be helpful.  HYPERTENSION/A.fib Currently taking: see medication list.  He is on carvedilol and amlodipine. Med Adherence: '[x]'  Yes    '[]'  No Medication side effects: '[]'  Yes    '[x]'  No Adherence with salt restriction: '[x]'  Yes    '[]'  No Home Monitoring?: '[]'  Yes    '[x]'  No Monitoring Frequency: '[]'  Yes    '[]'  No Home BP results range: '[]'  Yes    '[]'  No SOB? '[x]'  Yes    '[]'  No Chest Pain?: '[]'  Yes    '[x]'  No Leg swelling?: '[x]'  Yes - in RT ankle    '[]'  No Headaches?: '[]'  Yes    '[x]'  No Dizziness? '[]'  Yes    '[x]'  No Comments: no bruising or bleeding on Eliquis.  No palpitations  HL: reports compliance with  taking Lipitor.  PreDM:  Feels he does well with eating habits  Tob Dep: trying to quit.  Smoking 8-10 cigarettes a day.  HM: given FIT kit on last visit.  He has not used it as yet.  Patient Active Problem List   Diagnosis Date Noted  . Hordeolum externum left upper eyelid 06/29/2019  . Erectile disorder due to medical condition in male 05/11/2019  . Chronic atrial fibrillation ([place]) 09/03/2018  . Midline low back pain without sciatica 03/29/2016  . Obesity (BMI 30-39.9) 03/29/2016  . Bilateral foot pain 07/20/2015  . Essential hypertension   . Tobacco abuse   . Pre-diabetes   . Vitamin D insufficiency 08/24/2013  . Other hyperlipidemia 08/24/2013     Current Outpatient Medications on File Prior to Visit  Medication Sig Dispense Refill  . albuterol (PROVENTIL HFA;VENTOLIN HFA) 108 (90 BASE) MCG/ACT inhaler Inhale 1-2 puffs into the lungs every 6 (six) hours as needed for wheezing. 1 Inhaler 0  . amLODipine (NORVASC) 10 MG tablet Take 1 tablet (10 mg total) by mouth daily. 30 tablet 6  . apixaban (ELIQUIS) 5 MG TABS tablet Take 1 tablet (5 mg total) by  mouth 2 (two) times daily. 60 tablet 6  . atorvastatin (LIPITOR) 10 MG tablet Take 1 tablet (10 mg total) by mouth daily. 30 tablet 6  . blood glucose meter kit and supplies KIT Dispense based on patient and insurance preference. Use up to four times daily as directed. (FOR ICD-9 250.00, 250.01). (Patient not taking: Reported on 03/02/2020) 1 each 0  . Blood Pressure Monitoring (5 SERIES BP MONITOR) DEVI 1 Units by Does not apply route daily. Measure blood pressure daily 1 each 0  . carvedilol (COREG) 12.5 MG tablet Take 1 tablet (12.5 mg total) by mouth 2 (two) times daily with a meal. 60 tablet 6  . Cholecalciferol (DIALYVITE VITAMIN D 5000 PO) Take 1 capsule by mouth daily.    Marland Kitchen gabapentin (NEURONTIN) 400 MG capsule TAKE 1 CAPSULE (400 MG TOTAL) BY MOUTH 3 (THREE) TIMES DAILY. 90 capsule 0  . sildenafil (VIAGRA) 50 MG tablet Take 1  tablet (50 mg total) by mouth daily as needed for erectile dysfunction. 10 tablet 0   No current facility-administered medications on file prior to visit.    No Known Allergies  Social History   Socioeconomic History  . Marital status: Married    Spouse name: Not on file  . Number of children: Not on file  . Years of education: Not on file  . Highest education level: Not on file  Occupational History  . Not on file  Tobacco Use  . Smoking status: Current Some Day Smoker    Packs/day: 0.20    Types: Cigarettes  . Smokeless tobacco: Never Used  Substance and Sexual Activity  . Alcohol use: No  . Drug use: No  . Sexual activity: Not Currently  Other Topics Concern  . Not on file  Social History Narrative  . Not on file   Social Determinants of Health   Financial Resource Strain:   . Difficulty of Paying Living Expenses: Not on file  Food Insecurity:   . Worried About Charity fundraiser in the Last Year: Not on file  . Ran Out of Food in the Last Year: Not on file  Transportation Needs:   . Lack of Transportation (Medical): Not on file  . Lack of Transportation (Non-Medical): Not on file  Physical Activity:   . Days of Exercise per Week: Not on file  . Minutes of Exercise per Session: Not on file  Stress:   . Feeling of Stress : Not on file  Social Connections:   . Frequency of Communication with Friends and Family: Not on file  . Frequency of Social Gatherings with Friends and Family: Not on file  . Attends Religious Services: Not on file  . Active Member of Clubs or Organizations: Not on file  . Attends Archivist Meetings: Not on file  . Marital Status: Not on file  Intimate Partner Violence:   . Fear of Current or Ex-Partner: Not on file  . Emotionally Abused: Not on file  . Physically Abused: Not on file  . Sexually Abused: Not on file    Family History  Problem Relation Age of Onset  . Hypertension Father   . Diabetes Maternal Grandmother     . Hypertension Maternal Grandmother   . Diabetes Maternal Grandfather   . Hypertension Maternal Grandfather     No past surgical history on file.  ROS: Review of Systems Negative except as stated above  PHYSICAL EXAM: BP 133/85   Pulse 100   Resp 16  Ht 5' 10.5" (1.791 m)   Wt 246 lb 3.2 oz (111.7 kg)   SpO2 98%   BMI 34.83 kg/m   Physical Exam   General appearance - alert, well appearing, middle-aged older African-American male, obese and in no distress Mental status - normal mood, behavior, speech, dress, motor activity, and thought processes Neck - supple, no significant adenopathy Chest - clear to auscultation, no wheezes, rales or rhonchi, symmetric air entry Heart - normal rate, regular rhythm, normal S1, S2, no murmurs, rubs, clicks or gallops Musculoskeletal -right ankle: Mild edema over the lateral aspect.  Mild tenderness on palpation over the lateral malleolus.  Discomfort with attempted flexion and extension and rotation.  extremities -no edema in the lower legs. CMP Latest Ref Rng & Units 05/11/2019 09/01/2018 07/21/2017  Glucose 65 - 99 mg/dL 104(H) 85 82  BUN 6 - 24 mg/dL '8 13 9  ' Creatinine 0.76 - 1.27 mg/dL 1.07 1.23 1.18  Sodium 134 - 144 mmol/L 143 138 142  Potassium 3.5 - 5.2 mmol/L 4.5 3.1(L) 4.2  Chloride 96 - 106 mmol/L 103 105 101  CO2 20 - 29 mmol/L '25 23 26  ' Calcium 8.7 - 10.2 mg/dL 9.3 8.9 9.6  Total Protein 6.0 - 8.5 g/dL 7.0 - 7.5  Total Bilirubin 0.0 - 1.2 mg/dL 0.2 - 0.4  Alkaline Phos 39 - 117 IU/L 92 - 79  AST 0 - 40 IU/L 18 - 19  ALT 0 - 44 IU/L 20 - 16   Lipid Panel     Component Value Date/Time   CHOL 193 07/21/2017 1159   TRIG 104 07/21/2017 1159   HDL 45 07/21/2017 1159   CHOLHDL 4.3 07/21/2017 1159   CHOLHDL 5.1 06/23/2015 0406   VLDL 21 06/23/2015 0406   LDLCALC 127 (H) 07/21/2017 1159    CBC    Component Value Date/Time   WBC CANCELED 05/11/2019 0922   WBC 9.5 09/01/2018 1624   RBC CANCELED 05/11/2019 0922   RBC  4.96 09/01/2018 1624   HGB CANCELED 05/11/2019 0922   HCT CANCELED 05/11/2019 0922   PLT CANCELED 05/11/2019 0922   MCV 90.7 09/01/2018 1624   MCV 87 07/21/2017 1159   MCH 28.4 09/01/2018 1624   MCHC 31.3 09/01/2018 1624   RDW 14.3 09/01/2018 1624   RDW 14.4 07/21/2017 1159   LYMPHSABS CANCELED 05/11/2019 0922   MONOABS 1.0 07/01/2014 1803   EOSABS CANCELED 05/11/2019 0922   BASOSABS CANCELED 05/11/2019 0922    ASSESSMENT AND PLAN:  1. Chronic pain of right ankle This is acute on chronic pain of the right ankle.  He has mild swelling over the lateral malleolus.  X-ray ordered.  Referred to orthopedics.  I had offered a prescription for tramadol but patient declined preferring just Tylenol.  Also given Voltaren gel.  Advised to elevate and apply heat.  Note given for work keeping him out for another week. - DG Ankle Complete Right; Future - Ambulatory referral to Orthopedics - acetaminophen (TYLENOL) 500 MG tablet; Take 2 tablets (1,000 mg total) by mouth every 6 (six) hours as needed for headache.  Dispense: 60 tablet; Refill: 2 - diclofenac Sodium (VOLTAREN) 1 % GEL; Apply 2 g topically 4 (four) times daily as needed.  Dispense: 100 g; Refill: 2  2. Essential hypertension Close to goal.  Continue amlodipine and metoprolol.  3. Chronic atrial fibrillation (HCC) On Eliquis.  4. Pre-diabetes Discussed and encourage healthy eating habits.  Patient due for baseline blood test today including CBC, chemistry,  lipid profile and A1c.  He declines.  He states he will get his blood test done when he sees Dr. Joya Gaskins again.  He is requesting to have Dr. Joya Gaskins be his PCP.     Patient was given the opportunity to ask questions.  Patient verbalized understanding of the plan and was able to repeat key elements of the plan.   Orders Placed This Encounter  Procedures  . DG Ankle Complete Right  . Ambulatory referral to Orthopedics     Requested Prescriptions   Signed Prescriptions  Disp Refills  . acetaminophen (TYLENOL) 500 MG tablet 60 tablet 2    Sig: Take 2 tablets (1,000 mg total) by mouth every 6 (six) hours as needed for headache.  . diclofenac Sodium (VOLTAREN) 1 % GEL 100 g 2    Sig: Apply 2 g topically 4 (four) times daily as needed.    Return in about 6 weeks (around 06/16/2020) for with Dr. Joya Gaskins.  Pt wants Dr. Joya Gaskins to be his PCP.  Karle Plumber, MD, FACP

## 2020-05-09 ENCOUNTER — Ambulatory Visit (INDEPENDENT_AMBULATORY_CARE_PROVIDER_SITE_OTHER): Payer: Medicare Other | Admitting: Orthopedic Surgery

## 2020-05-09 ENCOUNTER — Ambulatory Visit: Payer: Medicare Other

## 2020-05-09 DIAGNOSIS — M25871 Other specified joint disorders, right ankle and foot: Secondary | ICD-10-CM

## 2020-05-09 DIAGNOSIS — M25571 Pain in right ankle and joints of right foot: Secondary | ICD-10-CM

## 2020-05-11 ENCOUNTER — Encounter: Payer: Self-pay | Admitting: Orthopedic Surgery

## 2020-05-11 DIAGNOSIS — M25871 Other specified joint disorders, right ankle and foot: Secondary | ICD-10-CM

## 2020-05-11 MED ORDER — LIDOCAINE HCL 1 % IJ SOLN
2.0000 mL | INTRAMUSCULAR | Status: AC | PRN
  Administered 2020-05-11: 2 mL

## 2020-05-11 MED ORDER — METHYLPREDNISOLONE ACETATE 40 MG/ML IJ SUSP
40.0000 mg | INTRAMUSCULAR | Status: AC | PRN
  Administered 2020-05-11: 40 mg via INTRA_ARTICULAR
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
## 2020-06-06 ENCOUNTER — Ambulatory Visit: Payer: Medicare Other | Admitting: Orthopedic Surgery

## 2020-06-29 MED FILL — AMLODIPINE BESYLATE 10 MG T: 10 | 30 days supply | Qty: 30 | Fill #2

## 2020-07-03 NOTE — Progress Notes (Deleted)
Subjective:    Patient ID: Brian Moody., male    DOB: December 14, 1965, 55 y.o.   MRN: [id number]  History of Present Illness: 05/11/19 This is a 55 year old male seen in return follow-up for hypertension, chronic atrial fibrillation, hyperlipidemia, tobacco use.  Patient was last seen in March of this year.  He is yet to turn his fecal occult blood kit in for testing.  He has previously declined flu vaccine and tetanus vaccine.  He has been out of his blood pressure medicines for 1 week.  He continues to smoke to 3 cigarettes daily.  He is determined to quit smoking this year.  He has had difficulty affording the Eliquis.  The patient does complain of erectile dysfunction has not had testosterone levels checked.  The patient is also not been compliant with a-hypertensive diet.   06/29/2019 This patient requested this visit be converted to a telehealth telephone visit.  Since the last visit the patient states his heart rate has been under good control and he is maintaining his Coreg twice daily.  He is also maintaining the Eliquis twice daily as well.  The patient is yet to achieve a blood pressure monitor at home and is not certain what his blood pressure is running indicated to him we would try to obtain this for him.  Patient also struggles with erectile dysfunction likely on the basis of the beta-blocker he is on.  He states the low-dose Cialis did not help him and would like a change to another product.  Patient still has ongoing obesity however his weight is now down to 240 pounds and he is following a diet.  The patient also was in the urgent care recently with an infection in the left upper eyelid he received antibiotics for this and pain medication.  It was quite severe.  He does not have an ophthalmologist at this time.  The other issue is that this patient is not able to regularly follow his blood sugars and he has been labeled prediabetic.  He states he does have increased urination  increased thirst he states he has numbness in the feet and coldness in the feet when he is out in the cold air.  He has sharp pains in the heels and this is been a chronic problem as well.  He is focused on tobacco cessation is decreasing slowly on this but is still smoking quite a bit at this time.  He is taking the vitamin D supplement we prescribed at the last visit when he was found to have significantly low levels of vitamin D  03/02/2020 Patient seen in return follow-up still complains of bilateral ankle and foot pain.  He is having increased falls with this.  He brings paperwork from his insurance company that he had a nurse visit he has a hemoglobin A1c of 5.7  Patient's been compliant with his blood pressure medications and has no other complaints except for some low back discomfort  07/04/20 Not seen since Nov per Dr Wynetta Emery  Bilateral foot pain Bilateral foot pain with tenderness in the midfoot compatible with plantar fasciitis  I have refilled the gabapentin and will refer patient to sports medicine  Essential hypertension Essential hypertension stable at this time  Continue current medication profile  Pre-diabetes History of prediabetes without full-blown diabetes A1c now 5.7 will monitor with diet and exercise alone  Tobacco abuse    Current smoking consumption amount: 1/4 pack a day  Dicsussion on advise to quit smoking  and smoking impacts: Cardiovascular health  Patient's willingness to quit: Willing to consider  Methods to quit smoking discussed: Behavioral modification  Medication management of smoking session drugs discussed: None  Resources provided:  AVS   Setting quit date not determined  Follow-up arranged 2 months   Time spent counseling the patient: 5 minutes    [name] was seen today for follow-up.  Diagnoses and all orders for this visit:  Bilateral foot pain -     Ambulatory referral to Sports Medicine  Essential hypertension -      amLODipine (NORVASC) 10 MG tablet; Take 1 tablet (10 mg total) by mouth daily. -     carvedilol (COREG) 12.5 MG tablet; Take 1 tablet (12.5 mg total) by mouth 2 (two) times daily with a meal.  Other hyperlipidemia -     atorvastatin (LIPITOR) 10 MG tablet; Take 1 tablet (10 mg total) by mouth daily.  Chronic atrial fibrillation (HCC) -     carvedilol (COREG) 12.5 MG tablet; Take 1 tablet (12.5 mg total) by mouth 2 (two) times daily with a meal. -     apixaban (ELIQUIS) 5 MG TABS tablet; Take 1 tablet (5 mg total) by mouth 2 (two) times daily.  Colon cancer screening -     Fecal occult blood, imunochemical  Pre-diabetes  Tobacco abuse  Other orders -     gabapentin (NEURONTIN) 400 MG capsule; Take 1 capsule (400 mg total) by mouth 3 (three) times daily.   All medications were refilled  Send fecal occult kit for cancer screening of the colon  04/2020:   1. Chronic pain of right ankle This is acute on chronic pain of the right ankle.  He has mild swelling over the lateral malleolus.  X-ray ordered.  Referred to orthopedics.  I had offered a prescription for tramadol but patient declined preferring just Tylenol.  Also given Voltaren gel.  Advised to elevate and apply heat.  Note given for work keeping him out for another week. - DG Ankle Complete Right; Future - Ambulatory referral to Orthopedics - acetaminophen (TYLENOL) 500 MG tablet; Take 2 tablets (1,000 mg total) by mouth every 6 (six) hours as needed for headache.  Dispense: 60 tablet; Refill: 2 - diclofenac Sodium (VOLTAREN) 1 % GEL; Apply 2 g topically 4 (four) times daily as needed.  Dispense: 100 g; Refill: 2  2. Essential hypertension Close to goal.  Continue amlodipine and metoprolol.  3. Chronic atrial fibrillation (HCC) On Eliquis.  4. Pre-diabetes Discussed and encourage healthy eating habits.  Patient due for baseline blood test today including CBC, chemistry, lipid profile and A1c.  He declines.  He states  he will get his blood test done when he sees Dr. Joya Gaskins again.  He is requesting to have Dr. Joya Gaskins be his PCP.  Then saw Frederica ;pw for PCP     Past Medical History:  Diagnosis Date  . Avulsion fracture of distal fibula 06/02/2013   Right distal fibula occurred 04/30/2013   . Hypertension   . Persistent atrial fibrillation ([place])    a. newly diagnosed on 05/2015 admission. started on coumadin.   . Pre-diabetes    a. HgA1c 6.3 05/2015  . Tobacco abuse      Family History  Problem Relation Age of Onset  . Hypertension Father   . Diabetes Maternal Grandmother   . Hypertension Maternal Grandmother   . Diabetes Maternal Grandfather   . Hypertension Maternal Grandfather      Social History  Socioeconomic History  . Marital status: Married    Spouse name: Not on file  . Number of children: Not on file  . Years of education: Not on file  . Highest education level: Not on file  Occupational History  . Not on file  Tobacco Use  . Smoking status: Current Some Day Smoker    Packs/day: 0.20    Types: Cigarettes  . Smokeless tobacco: Never Used  Substance and Sexual Activity  . Alcohol use: No  . Drug use: No  . Sexual activity: Not Currently  Other Topics Concern  . Not on file  Social History Narrative  . Not on file   Social Determinants of Health   Financial Resource Strain: Not on file  Food Insecurity: Not on file  Transportation Needs: Not on file  Physical Activity: Not on file  Stress: Not on file  Social Connections: Not on file  Intimate Partner Violence: Not on file     No Known Allergies   Outpatient Medications Prior to Visit  Medication Sig Dispense Refill  . acetaminophen (TYLENOL) 500 MG tablet Take 2 tablets (1,000 mg total) by mouth every 6 (six) hours as needed for headache. 60 tablet 2  . albuterol (PROVENTIL HFA;VENTOLIN HFA) 108 (90 BASE) MCG/ACT inhaler Inhale 1-2 puffs into the lungs every 6 (six) hours as needed for wheezing. 1  Inhaler 0  . amLODipine (NORVASC) 10 MG tablet Take 1 tablet (10 mg total) by mouth daily. 30 tablet 6  . apixaban (ELIQUIS) 5 MG TABS tablet Take 1 tablet (5 mg total) by mouth 2 (two) times daily. 60 tablet 6  . atorvastatin (LIPITOR) 10 MG tablet Take 1 tablet (10 mg total) by mouth daily. 30 tablet 6  . blood glucose meter kit and supplies KIT Dispense based on patient and insurance preference. Use up to four times daily as directed. (FOR ICD-9 250.00, 250.01). (Patient not taking: Reported on 03/02/2020) 1 each 0  . Blood Pressure Monitoring (5 SERIES BP MONITOR) DEVI 1 Units by Does not apply route daily. Measure blood pressure daily 1 each 0  . carvedilol (COREG) 12.5 MG tablet Take 1 tablet (12.5 mg total) by mouth 2 (two) times daily with a meal. 60 tablet 6  . Cholecalciferol (DIALYVITE VITAMIN D 5000 PO) Take 1 capsule by mouth daily.    . diclofenac Sodium (VOLTAREN) 1 % GEL Apply 2 g topically 4 (four) times daily as needed. 100 g 2  . gabapentin (NEURONTIN) 400 MG capsule TAKE 1 CAPSULE (400 MG TOTAL) BY MOUTH 3 (THREE) TIMES DAILY. 90 capsule 0  . sildenafil (VIAGRA) 50 MG tablet Take 1 tablet (50 mg total) by mouth daily as needed for erectile dysfunction. 10 tablet 0   No facility-administered medications prior to visit.    Review of Systems Constitutional:   No  weight loss, night sweats,  Fevers, chills, fatigue, lassitude. HEENT:   No headaches,  Difficulty swallowing,  Tooth/dental problems,  Sore throat,                No sneezing, itching, ear ache, nasal congestion, post nasal drip,   CV:  No chest pain,  Orthopnea, PND, swelling in lower extremities, anasarca, dizziness, palpitations  GI  No heartburn, indigestion, abdominal pain, nausea, vomiting, diarrhea, change in bowel habits, loss of appetite  Resp: No shortness of breath with exertion or at rest.  No excess mucus, no productive cough,  No non-productive cough,  No coughing up of blood.  No  change in color of  mucus.  No wheezing.  No chest wall deformity  Skin: no rash or lesions.  GU: no dysuria, change in color of urine, no urgency or frequency.  No flank pain. ERECTILE DYSFUNCTION  MS:  o joint pain or swelling.  No decreased range of motion.  No back pain.  Psych:  No change in mood or affect. No depression or anxiety.  No memory loss.     Objective:   Physical Exam  There were no vitals filed for this visit.Gen: Pleasant, well-nourished, in no distress,  normal affect  ENT: No lesions,  mouth clear,  oropharynx clear, no postnasal drip  Neck: No JVD, no TMG, no carotid bruits  Lungs: No use of accessory muscles, no dullness to percussion, clear without rales or rhonchi  Cardiovascular: RRR, heart sounds normal, no murmur or gallops, no peripheral edema  Abdomen: soft and NT, no HSM,  BS normal  Musculoskeletal: No deformities, no cyanosis or clubbing  Neuro: alert, non focal  Skin: Warm, no lesions or rashes        Assessment & Plan:  I personally reviewed all images and lab data in the [facility] system as well as any outside material available during this office visit and agree with the  radiology impressions.  Below from yet No problem-specific Assessment & Plan notes found for this encounter.   There are no diagnoses linked to this encounter. All medications were refilled  Send fecal occult kit for cancer screening of the colon

## 2020-07-04 ENCOUNTER — Ambulatory Visit: Payer: Medicare Other | Admitting: Critical Care Medicine

## 2020-08-11 NOTE — Progress Notes (Signed)
Subjective:    Patient ID: Brian Moody., male    DOB: 1965-12-10, 55 y.o.   MRN: [id number]  History of Present Illness: 05/11/19 This is a 55 year old male seen in return follow-up for hypertension, chronic atrial fibrillation, hyperlipidemia, tobacco use.  Patient was last seen in March of this year.  He is yet to turn his fecal occult blood kit in for testing.  He has previously declined flu vaccine and tetanus vaccine.  He has been out of his blood pressure medicines for 1 week.  He continues to smoke to 3 cigarettes daily.  He is determined to quit smoking this year.  He has had difficulty affording the Eliquis.  The patient does complain of erectile dysfunction has not had testosterone levels checked.  The patient is also not been compliant with a-hypertensive diet.   06/29/2019 This patient requested this visit be converted to a telehealth telephone visit.  Since the last visit the patient states his heart rate has been under good control and he is maintaining his Coreg twice daily.  He is also maintaining the Eliquis twice daily as well.  The patient is yet to achieve a blood pressure monitor at home and is not certain what his blood pressure is running indicated to him we would try to obtain this for him.  Patient also struggles with erectile dysfunction likely on the basis of the beta-blocker he is on.  He states the low-dose Cialis did not help him and would like a change to another product.  Patient still has ongoing obesity however his weight is now down to 240 pounds and he is following a diet.  The patient also was in the urgent care recently with an infection in the left upper eyelid he received antibiotics for this and pain medication.  It was quite severe.  He does not have an ophthalmologist at this time.  The other issue is that this patient is not able to regularly follow his blood sugars and he has been labeled prediabetic.  He states he does have increased urination  increased thirst he states he has numbness in the feet and coldness in the feet when he is out in the cold air.  He has sharp pains in the heels and this is been a chronic problem as well.  He is focused on tobacco cessation is decreasing slowly on this but is still smoking quite a bit at this time.  He is taking the vitamin D supplement we prescribed at the last visit when he was found to have significantly low levels of vitamin D  03/02/2020 Patient seen in return follow-up still complains of bilateral ankle and foot pain.  He is having increased falls with this.  He brings paperwork from his insurance company that he had a nurse visit he has a hemoglobin A1c of 5.7  Patient's been compliant with his blood pressure medications and has no other complaints except for some low back discomfort  08/14/20: Patient is returned today for follow-up for primary care complains of bilateral flank pain and some burning on urination also complains of right ankle pain that is worse.  He saw Dr. Sharol Given in November had an injection of the ankle with steroid and lidocaine this seemed to help but now has worsened again.  There was a question of whether he may need arthroscopy of the ankle.  He had prior fractures and injury to the ankle in the past.  Patient has quit smoking at this time.  He has a hemoglobin A1c of 5.7 in the past does need this followed up.  Blood pressure on arrival was elevated but he did not take his medicines this morning and often forget to take the second dose of Coreg.  He also will forget to take his second dose of apixaban with his history of atrial fibrillation and stroke risk.  Patient also complains of some chronic low back pain as well at this visit.  He states the gabapentin has not helped the right ankle.  Patient also states tramadol has not been helpful in the past as well. Patient does have some of the diclofenac gel left and he does state this helps to some degree and is still  using  Past Medical History:  Diagnosis Date  . Avulsion fracture of distal fibula 06/02/2013   Right distal fibula occurred 04/30/2013   . Hordeolum externum left upper eyelid 06/29/2019  . Hypertension   . Persistent atrial fibrillation ([place])    a. newly diagnosed on 05/2015 admission. started on coumadin.   . Pre-diabetes    a. HgA1c 6.3 05/2015  . Tobacco abuse      Family History  Problem Relation Age of Onset  . Hypertension Father   . Diabetes Maternal Grandmother   . Hypertension Maternal Grandmother   . Diabetes Maternal Grandfather   . Hypertension Maternal Grandfather      Social History   Socioeconomic History  . Marital status: Married    Spouse name: Not on file  . Number of children: Not on file  . Years of education: Not on file  . Highest education level: Not on file  Occupational History  . Not on file  Tobacco Use  . Smoking status: Former Smoker    Types: Cigarettes  . Smokeless tobacco: Never Used  Substance and Sexual Activity  . Alcohol use: No  . Drug use: No  . Sexual activity: Not Currently  Other Topics Concern  . Not on file  Social History Narrative  . Not on file   Social Determinants of Health   Financial Resource Strain: Not on file  Food Insecurity: Not on file  Transportation Needs: Not on file  Physical Activity: Not on file  Stress: Not on file  Social Connections: Not on file  Intimate Partner Violence: Not on file     No Known Allergies   Outpatient Medications Prior to Visit  Medication Sig Dispense Refill  . acetaminophen (TYLENOL) 500 MG tablet Take 2 tablets (1,000 mg total) by mouth every 6 (six) hours as needed for headache. 60 tablet 2  . albuterol (PROVENTIL HFA;VENTOLIN HFA) 108 (90 BASE) MCG/ACT inhaler Inhale 1-2 puffs into the lungs every 6 (six) hours as needed for wheezing. 1 Inhaler 0  . blood glucose meter kit and supplies KIT Dispense based on patient and insurance preference. Use up to four times  daily as directed. (FOR ICD-9 250.00, 250.01). 1 each 0  . Blood Pressure Monitoring (5 SERIES BP MONITOR) DEVI 1 Units by Does not apply route daily. Measure blood pressure daily 1 each 0  . Cholecalciferol (DIALYVITE VITAMIN D 5000 PO) Take 1 capsule by mouth daily.    . diclofenac Sodium (VOLTAREN) 1 % GEL Apply 2 g topically 4 (four) times daily as needed. 100 g 2  . amLODipine (NORVASC) 10 MG tablet Take 1 tablet (10 mg total) by mouth daily. 30 tablet 6  . apixaban (ELIQUIS) 5 MG TABS tablet Take 1 tablet (5 mg total) by  mouth 2 (two) times daily. 60 tablet 6  . atorvastatin (LIPITOR) 10 MG tablet Take 1 tablet (10 mg total) by mouth daily. 30 tablet 6  . carvedilol (COREG) 12.5 MG tablet Take 1 tablet (12.5 mg total) by mouth 2 (two) times daily with a meal. 60 tablet 6  . gabapentin (NEURONTIN) 400 MG capsule TAKE 1 CAPSULE (400 MG TOTAL) BY MOUTH 3 (THREE) TIMES DAILY. 90 capsule 0  . sildenafil (VIAGRA) 50 MG tablet Take 1 tablet (50 mg total) by mouth daily as needed for erectile dysfunction. 10 tablet 0   No facility-administered medications prior to visit.    Review of Systems Constitutional:   No  weight loss, night sweats,  Fevers, chills, fatigue, lassitude. HEENT:   No headaches,  Difficulty swallowing,  Tooth/dental problems,  Sore throat,                No sneezing, itching, ear ache, nasal congestion, post nasal drip,   CV:  No chest pain,  Orthopnea, PND, swelling in lower extremities, anasarca, dizziness, palpitations  GI  No heartburn, indigestion, abdominal pain, nausea, vomiting, diarrhea, change in bowel habits, loss of appetite  Resp: No shortness of breath with exertion or at rest.  No excess mucus, no productive cough,  No non-productive cough,  No coughing up of blood.  No change in color of mucus.  No wheezing.  No chest wall deformity  Skin: no rash or lesions.  GU: no dysuria, change in color of urine, no urgency or frequency.  No flank pain. ERECTILE  DYSFUNCTION  MS:  o joint pain or swelling.  No decreased range of motion.  No back pain.  Psych:  No change in mood or affect. No depression or anxiety.  No memory loss.     Objective:   Physical Exam  Vitals:   08/14/20 1440  BP: (!) 152/89  Pulse: 91  SpO2: 99%  Weight: 250 lb 6.4 oz (113.6 kg)  Gen: Pleasant, well-nourished, in no distress,  normal affect  ENT: No lesions,  mouth clear,  oropharynx clear, no postnasal drip  Neck: No JVD, no TMG, no carotid bruits  Lungs: No use of accessory muscles, no dullness to percussion, clear without rales or rhonchi  Cardiovascular: RRR, heart sounds normal, no murmur or gallops, no peripheral edema  Abdomen: soft and NT, no HSM,  BS normal  Musculoskeletal: Decreased range of motion right ankle Neuro: alert, non focal  Skin: Warm, no lesions or rashes        Assessment & Plan:  I personally reviewed all images and lab data in the [facility] system as well as any outside material available during this office visit and agree with the  radiology impressions.  Below from yet Dysuria Dysuria with bilateral flank pain  Obtain urinalysis and urine culture  Essential hypertension Plan to change Coreg to bisoprolol 10 mg daily to improve adherence to medications and also continue amlodipine daily  Chronic pain of right ankle Patient sent back in referral to Dr. Sharol Given for further evaluations  Midline low back pain without sciatica Chronic low back pain will observe for now  Erectile dysfunction Refill Viagra at increased dose 100 mg daily if needed  Other hyperlipidemia Hold off on therapy for now  Former smoker Patient successfully quit smoking I congratulated him on this   [name] was seen today for establish care.  Diagnoses and all orders for this visit:  Dysuria -     Urinalysis -  Urine Culture  Chronic atrial fibrillation (HCC) -     apixaban (ELIQUIS) 5 MG TABS tablet; Take 1 tablet (5 mg total) by mouth 2  (two) times daily. -     CBC with Differential/Platelet  Essential hypertension -     amLODipine (NORVASC) 10 MG tablet; Take 1 tablet (10 mg total) by mouth daily. -     Comprehensive metabolic panel  Other hyperlipidemia -     atorvastatin (LIPITOR) 10 MG tablet; Take 1 tablet (10 mg total) by mouth daily. -     Hemoglobin A1c -     Lipid panel  Chronic pain of right ankle -     Ambulatory referral to Orthopedic Surgery  Pre-diabetes -     Hemoglobin A1c  Erectile dysfunction, unspecified erectile dysfunction type  Midline low back pain without sciatica, unspecified chronicity  Former smoker  Other orders -     sildenafil (VIAGRA) 100 MG tablet; Take 1 tablet (100 mg total) by mouth daily as needed for erectile dysfunction. -     bisoprolol (ZEBETA) 10 MG tablet; Take 1 tablet (10 mg total) by mouth daily.   Patient still has fecal occult kit at home reminded to process this  The patient steadfastly refuses to receive Covid vaccinations or tetanus vaccine

## 2020-08-14 ENCOUNTER — Encounter: Payer: Self-pay | Admitting: Critical Care Medicine

## 2020-08-14 ENCOUNTER — Other Ambulatory Visit: Payer: Self-pay

## 2020-08-14 ENCOUNTER — Ambulatory Visit: Payer: Medicare Other | Attending: Critical Care Medicine | Admitting: Critical Care Medicine

## 2020-08-14 ENCOUNTER — Other Ambulatory Visit: Payer: Self-pay | Admitting: Critical Care Medicine

## 2020-08-14 VITALS — BP 152/89 | HR 91 | Wt 250.4 lb

## 2020-08-14 DIAGNOSIS — M545 Low back pain, unspecified: Secondary | ICD-10-CM | POA: Diagnosis not present

## 2020-08-14 DIAGNOSIS — R7303 Prediabetes: Secondary | ICD-10-CM | POA: Diagnosis not present

## 2020-08-14 DIAGNOSIS — I482 Chronic atrial fibrillation, unspecified: Secondary | ICD-10-CM

## 2020-08-14 DIAGNOSIS — R300 Dysuria: Secondary | ICD-10-CM

## 2020-08-14 DIAGNOSIS — G8929 Other chronic pain: Secondary | ICD-10-CM | POA: Diagnosis not present

## 2020-08-14 DIAGNOSIS — E7849 Other hyperlipidemia: Secondary | ICD-10-CM

## 2020-08-14 DIAGNOSIS — I10 Essential (primary) hypertension: Secondary | ICD-10-CM

## 2020-08-14 DIAGNOSIS — Z87891 Personal history of nicotine dependence: Secondary | ICD-10-CM

## 2020-08-14 DIAGNOSIS — N529 Male erectile dysfunction, unspecified: Secondary | ICD-10-CM

## 2020-08-14 DIAGNOSIS — M25571 Pain in right ankle and joints of right foot: Secondary | ICD-10-CM | POA: Diagnosis not present

## 2020-08-14 HISTORY — DX: Dysuria: R30.0

## 2020-08-14 MED ORDER — SILDENAFIL CITRATE 100 MG PO TABS: 100.0000 mg | ORAL_TABLET | Freq: Every day | ORAL | 1 refills | Status: DC | PRN

## 2020-08-14 MED ORDER — BISOPROLOL FUMARATE 10 MG PO TABS: 10.0000 mg | ORAL_TABLET | Freq: Every day | ORAL | 2 refills | Status: DC

## 2020-08-14 MED ORDER — AMLODIPINE BESYLATE 10 MG PO TABS: 10.0000 mg | ORAL_TABLET | Freq: Every day | ORAL | 2 refills | Status: DC

## 2020-08-14 MED ORDER — ATORVASTATIN CALCIUM 10 MG PO TABS: 10.0000 mg | ORAL_TABLET | Freq: Every day | ORAL | 2 refills | Status: DC

## 2020-08-14 MED ORDER — APIXABAN 5 MG PO TABS: 5.0000 mg | ORAL_TABLET | Freq: Two times a day (BID) | ORAL | 6 refills | Status: DC

## 2020-08-14 MED FILL — ATORVASTATIN 10 MG TABLET: 10 | 30 days supply | Qty: 30 | Fill #0

## 2020-08-14 MED FILL — AMLODIPINE BESYLATE 10 MG T: 10 | 30 days supply | Qty: 30 | Fill #0

## 2020-08-14 MED FILL — BISOPROLOL FUMARATE 10 MG T: 10 | 30 days supply | Qty: 30 | Fill #0

## 2020-08-14 MED FILL — ELIQUIS 5 MG TABLET: 5 | 30 days supply | Qty: 60 | Fill #0

## 2020-08-14 NOTE — Assessment & Plan Note (Signed)
Plan to change Coreg to bisoprolol 10 mg daily to improve adherence to medications and also continue amlodipine daily

## 2020-08-14 NOTE — Assessment & Plan Note (Signed)
Patient sent back in referral to Dr. Lajoyce Corners for further evaluations

## 2020-08-14 NOTE — Assessment & Plan Note (Signed)
Hold off on therapy for now

## 2020-08-14 NOTE — Assessment & Plan Note (Signed)
Patient successfully quit smoking I congratulated him on this

## 2020-08-14 NOTE — Assessment & Plan Note (Signed)
Refill Viagra at increased dose 100 mg daily if needed

## 2020-08-14 NOTE — Assessment & Plan Note (Signed)
Dysuria with bilateral flank pain  Obtain urinalysis and urine culture

## 2020-08-14 NOTE — Assessment & Plan Note (Signed)
Chronic low back pain will observe for now

## 2020-08-14 NOTE — Patient Instructions (Signed)
Discontinue Coreg and begin bisoprolol 1 daily for blood pressure  No change in other medications all medicines refilled sent to our pharmacy  Labs today include hemoglobin A1c cholesterol metabolic panel blood counts  Recommend checking urinalysis and urine culture I will call you with results  Referral back to Dr. Lajoyce Corners to be seen as soon as possible for the right ankle pain  Discontinue gabapentin for now as it is not effective  Congratulations on quitting smoking  Please stay on the Eliquis twice daily get a pill organizer to help you with your medication adherence  Follow the DASH diet as outlined below for your blood pressure  I did increase the Viagra to 100 mg tablet to take as needed  Return to see Dr. Delford Field in 2 months   [email or web address]">  DASH Eating Plan DASH stands for Dietary Approaches to Stop Hypertension. The DASH eating plan is a healthy eating plan that has been shown to:  Reduce high blood pressure (hypertension).  Reduce your risk for type 2 diabetes, heart disease, and stroke.  Help with weight loss. What are tips for following this plan? Reading food labels  Check food labels for the amount of salt (sodium) per serving. Choose foods with less than 5 percent of the Daily Value of sodium. Generally, foods with less than 300 milligrams (mg) of sodium per serving fit into this eating plan.  To find whole grains, look for the word "whole" as the first word in the ingredient list. Shopping  Buy products labeled as "low-sodium" or "no salt added."  Buy fresh foods. Avoid canned foods and pre-made or frozen meals. Cooking  Avoid adding salt when cooking. Use salt-free seasonings or herbs instead of table salt or sea salt. Check with your health care provider or pharmacist before using salt substitutes.  Do not fry foods. Cook foods using healthy methods such as baking, boiling, grilling, roasting, and  broiling instead.  Cook with heart-healthy oils, such as olive, canola, avocado, soybean, or sunflower oil. Meal planning  Eat a balanced diet that includes: ? 4 or more servings of fruits and 4 or more servings of vegetables each day. Try to fill one-half of your plate with fruits and vegetables. ? 6-8 servings of whole grains each day. ? Less than 6 oz (170 g) of lean meat, poultry, or fish each day. A 3-oz (85-g) serving of meat is about the same size as a deck of cards. One egg equals 1 oz (28 g). ? 2-3 servings of low-fat dairy each day. One serving is 1 cup (237 mL). ? 1 serving of nuts, seeds, or beans 5 times each week. ? 2-3 servings of heart-healthy fats. Healthy fats called omega-3 fatty acids are found in foods such as walnuts, flaxseeds, fortified milks, and eggs. These fats are also found in cold-water fish, such as sardines, salmon, and mackerel.  Limit how much you eat of: ? Canned or prepackaged foods. ? Food that is high in trans fat, such as some fried foods. ? Food that is high in saturated fat, such as fatty meat. ? Desserts and other sweets, sugary drinks, and other foods with added sugar. ? Full-fat dairy products.  Do not salt foods before eating.  Do not eat more than 4 egg yolks a week.  Try to eat at least 2 vegetarian meals a week.  Eat more home-cooked food and less restaurant, buffet, and fast food.   Lifestyle  When eating at a restaurant, ask that your food be  prepared with less salt or no salt, if possible.  If you drink alcohol: ? Limit how much you use to:  0-1 drink a day for women who are not pregnant.  0-2 drinks a day for men. ? Be aware of how much alcohol is in your drink. In the [place] one drink equals one 12 oz bottle of beer (355 mL), one 5 oz glass of wine (148 mL), or one 1 oz glass of hard liquor (44 mL). General information  Avoid eating more than 2,300 mg of salt a day. If you have hypertension, you may need to reduce your  sodium intake to 1,500 mg a day.  Work with your health care provider to maintain a healthy body weight or to lose weight. Ask what an ideal weight is for you.  Get at least 30 minutes of exercise that causes your heart to beat faster (aerobic exercise) most days of the week. Activities may include walking, swimming, or biking.  Work with your health care provider or dietitian to adjust your eating plan to your individual calorie needs. What foods should I eat? Fruits All fresh, dried, or frozen fruit. Canned fruit in natural juice (without added sugar). Vegetables Fresh or frozen vegetables (raw, steamed, roasted, or grilled). Low-sodium or reduced-sodium tomato and vegetable juice. Low-sodium or reduced-sodium tomato sauce and tomato paste. Low-sodium or reduced-sodium canned vegetables. Grains Whole-grain or whole-wheat bread. Whole-grain or whole-wheat pasta. Brown rice. Orpah Cobb. Bulgur. Whole-grain and low-sodium cereals. Pita bread. Low-fat, low-sodium crackers. Whole-wheat flour tortillas. Meats and other proteins Skinless chicken or [place]. Ground chicken or [place]. Pork with fat trimmed off. Fish and seafood. Egg whites. Dried beans, peas, or lentils. Unsalted nuts, nut butters, and seeds. Unsalted canned beans. Lean cuts of beef with fat trimmed off. Low-sodium, lean precooked or cured meat, such as sausages or meat loaves. Dairy Low-fat (1%) or fat-free (skim) milk. Reduced-fat, low-fat, or fat-free cheeses. Nonfat, low-sodium ricotta or cottage cheese. Low-fat or nonfat yogurt. Low-fat, low-sodium cheese. Fats and oils Soft margarine without trans fats. Vegetable oil. Reduced-fat, low-fat, or light mayonnaise and salad dressings (reduced-sodium). Canola, safflower, olive, avocado, soybean, and sunflower oils. Avocado. Seasonings and condiments Herbs. Spices. Seasoning mixes without salt. Other foods Unsalted popcorn and pretzels. Fat-free sweets. The items listed above may  not be a complete list of foods and beverages you can eat. Contact a dietitian for more information. What foods should I avoid? Fruits Canned fruit in a light or heavy syrup. Fried fruit. Fruit in cream or butter sauce. Vegetables Creamed or fried vegetables. Vegetables in a cheese sauce. Regular canned vegetables (not low-sodium or reduced-sodium). Regular canned tomato sauce and paste (not low-sodium or reduced-sodium). Regular tomato and vegetable juice (not low-sodium or reduced-sodium). Rosita Fire. Olives. Grains Baked goods made with fat, such as croissants, muffins, or some breads. Dry pasta or rice meal packs. Meats and other proteins Fatty cuts of meat. Ribs. Fried meat. Tomasa Blase. Bologna, salami, and other precooked or cured meats, such as sausages or meat loaves. Fat from the back of a pig (fatback). Bratwurst. Salted nuts and seeds. Canned beans with added salt. Canned or smoked fish. Whole eggs or egg yolks. Chicken or [place] with skin. Dairy Whole or 2% milk, cream, and half-and-half. Whole or full-fat cream cheese. Whole-fat or sweetened yogurt. Full-fat cheese. Nondairy creamers. Whipped toppings. Processed cheese and cheese spreads. Fats and oils Butter. Stick margarine. Lard. Shortening. Ghee. Bacon fat. Tropical oils, such as coconut, palm kernel, or palm oil. Seasonings and  condiments Onion salt, garlic salt, seasoned salt, table salt, and sea salt. Worcestershire sauce. Tartar sauce. Barbecue sauce. Teriyaki sauce. Soy sauce, including reduced-sodium. Steak sauce. Canned and packaged gravies. Fish sauce. Oyster sauce. Cocktail sauce. Store-bought horseradish. Ketchup. Mustard. Meat flavorings and tenderizers. Bouillon cubes. Hot sauces. Pre-made or packaged marinades. Pre-made or packaged taco seasonings. Relishes. Regular salad dressings. Other foods Salted popcorn and pretzels. The items listed above may not be a complete list of foods and beverages you should avoid. Contact a  dietitian for more information. Where to find more information  National Heart, Lung, and Blood Institute: PopSteam.is  American Heart Association: www.heart.org  Academy of Nutrition and Dietetics: www.eatright.org  National Kidney Foundation: www.kidney.org Summary  The DASH eating plan is a healthy eating plan that has been shown to reduce high blood pressure (hypertension). It may also reduce your risk for type 2 diabetes, heart disease, and stroke.  When on the DASH eating plan, aim to eat more fresh fruits and vegetables, whole grains, lean proteins, low-fat dairy, and heart-healthy fats.  With the DASH eating plan, you should limit salt (sodium) intake to 2,300 mg a day. If you have hypertension, you may need to reduce your sodium intake to 1,500 mg a day.  Work with your health care provider or dietitian to adjust your eating plan to your individual calorie needs. This information is not intended to replace advice given to you by your health care provider. Make sure you discuss any questions you have with your health care provider. Document Revised: 05/14/2019 Document Reviewed: 05/14/2019 Elsevier Patient Education  2021 [employer].

## 2020-08-15 ENCOUNTER — Other Ambulatory Visit: Payer: Self-pay | Admitting: Critical Care Medicine

## 2020-08-15 LAB — LIPID PANEL
Chol/HDL Ratio: 4 ratio (ref 0.0–5.0)
Cholesterol, Total: 185 mg/dL (ref 100–199)
HDL: 46 mg/dL (ref >39)
LDL Chol Calc (NIH): 119 mg/dL — ABNORMAL HIGH (ref 0–99)
Triglycerides: 112 mg/dL (ref 0–149)
VLDL Cholesterol Cal: 20 mg/dL (ref 5–40)

## 2020-08-15 LAB — URINALYSIS
Bilirubin, UA: NEGATIVE
Glucose, UA: NEGATIVE
Nitrite, UA: NEGATIVE
RBC, UA: NEGATIVE
Specific Gravity, UA: >=1.03 — AB (ref 1.005–1.030)
Urobilinogen, Ur: 0.2 mg/dL (ref 0.2–1.0)
pH, UA: 5.5 (ref 5.0–7.5)

## 2020-08-15 LAB — CBC WITH DIFFERENTIAL/PLATELET
Basophils Absolute: 0 10*3/uL (ref 0.0–0.2)
Basos: 0 %
EOS (ABSOLUTE): 0.1 10*3/uL (ref 0.0–0.4)
Eos: 1 %
Hematocrit: 49.2 % (ref 37.5–51.0)
Hemoglobin: 16.2 g/dL (ref 13.0–17.7)
Immature Grans (Abs): 0 10*3/uL (ref 0.0–0.1)
Immature Granulocytes: 0 %
Lymphocytes Absolute: 3.2 10*3/uL — ABNORMAL HIGH (ref 0.7–3.1)
Lymphs: 39 %
MCH: 29.8 pg (ref 26.6–33.0)
MCHC: 32.9 g/dL (ref 31.5–35.7)
MCV: 90 fL (ref 79–97)
Monocytes Absolute: 0.8 10*3/uL (ref 0.1–0.9)
Monocytes: 10 %
Neutrophils Absolute: 4.1 10*3/uL (ref 1.4–7.0)
Neutrophils: 50 %
Platelets: 244 10*3/uL (ref 150–450)
RBC: 5.44 x10E6/uL (ref 4.14–5.80)
RDW: 13.5 % (ref 11.6–15.4)
WBC: 8.1 10*3/uL (ref 3.4–10.8)

## 2020-08-15 LAB — COMPREHENSIVE METABOLIC PANEL
ALT: 21 IU/L (ref 0–44)
AST: 18 IU/L (ref 0–40)
Albumin/Globulin Ratio: 1.3 (ref 1.2–2.2)
Albumin: 4.2 g/dL (ref 3.8–4.9)
Alkaline Phosphatase: 90 IU/L (ref 44–121)
BUN/Creatinine Ratio: 12 (ref 9–20)
BUN: 13 mg/dL (ref 6–24)
Bilirubin Total: 0.3 mg/dL (ref 0.0–1.2)
CO2: 25 mmol/L (ref 20–29)
Calcium: 9.2 mg/dL (ref 8.7–10.2)
Chloride: 101 mmol/L (ref 96–106)
Creatinine, Ser: 1.08 mg/dL (ref 0.76–1.27)
GFR calc Af Amer: 89 mL/min/{1.73_m2} (ref >59)
GFR calc non Af Amer: 77 mL/min/{1.73_m2} (ref >59)
Globulin, Total: 3.3 g/dL (ref 1.5–4.5)
Glucose: 57 mg/dL — ABNORMAL LOW (ref 65–99)
Potassium: 4 mmol/L (ref 3.5–5.2)
Sodium: 143 mmol/L (ref 134–144)
Total Protein: 7.5 g/dL (ref 6.0–8.5)

## 2020-08-15 LAB — HEMOGLOBIN A1C
Est. average glucose Bld gHb Est-mCnc: 137 mg/dL
Hgb A1c MFr Bld: 6.4 % — ABNORMAL HIGH (ref 4.8–5.6)

## 2020-08-15 MED ORDER — CIPROFLOXACIN HCL 500 MG PO TABS: 500.0000 mg | ORAL_TABLET | Freq: Two times a day (BID) | ORAL | 0 refills | Status: DC

## 2020-08-15 MED ORDER — SILDENAFIL CITRATE 100 MG PO TABS: 100.0000 mg | ORAL_TABLET | Freq: Every day | ORAL | 1 refills | Status: DC | PRN

## 2020-08-16 LAB — URINE CULTURE: Organism ID, Bacteria: NO GROWTH

## 2020-08-22 ENCOUNTER — Ambulatory Visit (INDEPENDENT_AMBULATORY_CARE_PROVIDER_SITE_OTHER): Payer: Medicare Other | Admitting: Physician Assistant

## 2020-08-22 ENCOUNTER — Encounter: Payer: Self-pay | Admitting: Orthopedic Surgery

## 2020-08-22 DIAGNOSIS — G8929 Other chronic pain: Secondary | ICD-10-CM

## 2020-08-22 DIAGNOSIS — M25571 Pain in right ankle and joints of right foot: Secondary | ICD-10-CM

## 2020-08-22 NOTE — Progress Notes (Signed)
Office Visit Note   Patient: Brian Moody.           Date of Birth: 06/18/1966           MRN: [id number] Visit Date: 08/22/2020              Requested by: Storm Frisk, MD [address] [place],  [place] [address] PCP: Storm Frisk, MD  Chief Complaint  Patient presents with  . Right Ankle - Follow-up      HPI: Patient is a pleasant 55 year old gentleman who is status post ankle injection over 3 months ago.  He said the injection was quite helpful.  The only time he has significant difficulties with his ankle is with the weather is cold.  He is inquiring today if he could get another injection if the weather turns and this occurs.  He does not want an injection today  Assessment & Plan: Visit Diagnoses: No diagnosis found.  Plan: Discussed with patient that he may call anytime during the week and be happy to take care of this for him.  May follow-up as needed  Follow-Up Instructions: No follow-ups on file.   Ortho Exam  Patient is alert, oriented, no adenopathy, well-dressed, normal affect, normal respiratory effort. Right ankle nontender today but when tender points to medial lateral gutters.  No swelling no erythema ankle range of motion is mildly stiff.  Some mild stiffness with subtalar range of motion good endpoint on anterior drawer testing  Imaging: No results found. No images are attached to the encounter.  Labs: Lab Results  Component Value Date   HGBA1C 6.4 (H) 08/14/2020   HGBA1C 6.6 (H) 05/11/2019   HGBA1C 5.9 12/04/2015   LABURIC 4.4 04/12/2014   REPTSTATUS 04/20/2016 FINAL 04/18/2016   CULT MULTIPLE SPECIES PRESENT, SUGGEST RECOLLECTION (A) 04/18/2016   LABORGA NO GROWTH 04/12/2014     Lab Results  Component Value Date   ALBUMIN 4.2 08/14/2020   ALBUMIN 4.0 05/11/2019   ALBUMIN 4.3 07/21/2017   LABURIC 4.4 04/12/2014    No results found for: MG Lab Results  Component Value Date   VD25OH 10.3 (L) 05/11/2019   VD25OH 26 (L)  03/29/2016   VD25OH 11 (L) 12/04/2015    No results found for: PREALBUMIN CBC EXTENDED Latest Ref Rng & Units 08/14/2020 05/11/2019 09/01/2018  WBC 3.4 - 10.8 x10E3/uL 8.1 CANCELED 9.5  RBC 4.14 - 5.80 x10E6/uL 5.44 CANCELED 4.96  HGB 13.0 - 17.7 g/dL 97.6 CANCELED 73.4  HCT 37.5 - 51.0 % 49.2 CANCELED 45.0  PLT 150 - 450 x10E3/uL 244 CANCELED 218  NEUTROABS 1.4 - 7.0 x10E3/uL 4.1 - -  LYMPHSABS 0.7 - 3.1 x10E3/uL 3.2(H) CANCELED -     There is no height or weight on file to calculate BMI.  Orders:  No orders of the defined types were placed in this encounter.  No orders of the defined types were placed in this encounter.    Procedures: No procedures performed  Clinical Data: No additional findings.  ROS:  All other systems negative, except as noted in the HPI. Review of Systems  Objective: Vital Signs: There were no vitals taken for this visit.  Specialty Comments:  No specialty comments available.  PMFS History: Patient Active Problem List   Diagnosis Date Noted  . Dysuria 08/14/2020  . Chronic pain of right ankle 08/14/2020  . Erectile dysfunction 05/11/2019  . Chronic atrial fibrillation (HCC) 09/03/2018  . Midline low back pain without sciatica  03/29/2016  . Obesity (BMI 30-39.9) 03/29/2016  . Bilateral foot pain 07/20/2015  . Essential hypertension   . Former smoker   . Pre-diabetes   . Vitamin D insufficiency 08/24/2013  . Other hyperlipidemia 08/24/2013   Past Medical History:  Diagnosis Date  . Avulsion fracture of distal fibula 06/02/2013   Right distal fibula occurred 04/30/2013   . Hordeolum externum left upper eyelid 06/29/2019  . Hypertension   . Persistent atrial fibrillation (HCC)    a. newly diagnosed on 05/2015 admission. started on coumadin.   . Pre-diabetes    a. HgA1c 6.3 05/2015  . Tobacco abuse     Family History  Problem Relation Age of Onset  . Hypertension Father   . Diabetes Maternal Grandmother   . Hypertension Maternal  Grandmother   . Diabetes Maternal Grandfather   . Hypertension Maternal Grandfather     No past surgical history on file. Social History   Occupational History  . Not on file  Tobacco Use  . Smoking status: Former Smoker    Types: Cigarettes  . Smokeless tobacco: Never Used  Substance and Sexual Activity  . Alcohol use: No  . Drug use: No  . Sexual activity: Not Currently

## 2020-09-19 ENCOUNTER — Other Ambulatory Visit: Payer: Self-pay | Admitting: Critical Care Medicine

## 2020-10-01 ENCOUNTER — Other Ambulatory Visit: Payer: Self-pay | Admitting: Critical Care Medicine

## 2020-10-01 NOTE — Telephone Encounter (Signed)
Requested Prescriptions  Pending Prescriptions Disp Refills  . sildenafil (VIAGRA) 100 MG tablet [Pharmacy Med Name: Sildenafil Citrate 100mg  Tablet] 10 tablet 11    Sig: Take 1 tablet by mouth every day as needed for erectile dysfunction     Urology: Erectile Dysfunction Agents Failed - 10/01/2020  2:03 AM      Failed - Last BP in normal range    BP Readings from Last 1 Encounters:  08/14/20 (!) 152/89         Passed - Valid encounter within last 12 months    Recent Outpatient Visits          1 month ago Dysuria   [facility] 08/16/20, MD   4 months ago Chronic pain of right ankle   [facility], MD   7 months ago Essential hypertension   [facility] Marcine Matar, MD   1 year ago Hordeolum externum left upper eyelid   [facility] Storm Frisk, MD   1 year ago Essential hypertension   [facility] Storm Frisk, MD      Future Appointments            In 1 week Storm Frisk, MD [facility]

## 2020-10-09 NOTE — Progress Notes (Deleted)
Subjective:    Patient ID: Brian Moody., male    DOB: 1965-12-10, 55 y.o.   MRN: [id number]  History of Present Illness: 05/11/19 This is a 55 year old male seen in return follow-up for hypertension, chronic atrial fibrillation, hyperlipidemia, tobacco use.  Patient was last seen in March of this year.  He is yet to turn his fecal occult blood kit in for testing.  He has previously declined flu vaccine and tetanus vaccine.  He has been out of his blood pressure medicines for 1 week.  He continues to smoke to 3 cigarettes daily.  He is determined to quit smoking this year.  He has had difficulty affording the Eliquis.  The patient does complain of erectile dysfunction has not had testosterone levels checked.  The patient is also not been compliant with a-hypertensive diet.   06/29/2019 This patient requested this visit be converted to a telehealth telephone visit.  Since the last visit the patient states his heart rate has been under good control and he is maintaining his Coreg twice daily.  He is also maintaining the Eliquis twice daily as well.  The patient is yet to achieve a blood pressure monitor at home and is not certain what his blood pressure is running indicated to him we would try to obtain this for him.  Patient also struggles with erectile dysfunction likely on the basis of the beta-blocker he is on.  He states the low-dose Cialis did not help him and would like a change to another product.  Patient still has ongoing obesity however his weight is now down to 240 pounds and he is following a diet.  The patient also was in the urgent care recently with an infection in the left upper eyelid he received antibiotics for this and pain medication.  It was quite severe.  He does not have an ophthalmologist at this time.  The other issue is that this patient is not able to regularly follow his blood sugars and he has been labeled prediabetic.  He states he does have increased urination  increased thirst he states he has numbness in the feet and coldness in the feet when he is out in the cold air.  He has sharp pains in the heels and this is been a chronic problem as well.  He is focused on tobacco cessation is decreasing slowly on this but is still smoking quite a bit at this time.  He is taking the vitamin D supplement we prescribed at the last visit when he was found to have significantly low levels of vitamin D  03/02/2020 Patient seen in return follow-up still complains of bilateral ankle and foot pain.  He is having increased falls with this.  He brings paperwork from his insurance company that he had a nurse visit he has a hemoglobin A1c of 5.7  Patient's been compliant with his blood pressure medications and has no other complaints except for some low back discomfort  08/14/20: Patient is returned today for follow-up for primary care complains of bilateral flank pain and some burning on urination also complains of right ankle pain that is worse.  He saw Dr. Sharol Given in November had an injection of the ankle with steroid and lidocaine this seemed to help but now has worsened again.  There was a question of whether he may need arthroscopy of the ankle.  He had prior fractures and injury to the ankle in the past.  Patient has quit smoking at this time.  He has a hemoglobin A1c of 5.7 in the past does need this followed up.  Blood pressure on arrival was elevated but he did not take his medicines this morning and often forget to take the second dose of Coreg.  He also will forget to take his second dose of apixaban with his history of atrial fibrillation and stroke risk.  Patient also complains of some chronic low back pain as well at this visit.  He states the gabapentin has not helped the right ankle.  Patient also states tramadol has not been helpful in the past as well. Patient does have some of the diclofenac gel left and he does state this helps to some degree and is still  using  4/19 Dysuria Dysuria with bilateral flank pain  Obtain urinalysis and urine culture  Essential hypertension Plan to change Coreg to bisoprolol 10 mg daily to improve adherence to medications and also continue amlodipine daily  Chronic pain of right ankle Patient sent back in referral to Dr. Sharol Given for further evaluations  Midline low back pain without sciatica Chronic low back pain will observe for now  Erectile dysfunction Refill Viagra at increased dose 100 mg daily if needed  Other hyperlipidemia Hold off on therapy for now  Former smoker Patient successfully quit smoking I congratulated him on this   [name] was seen today for establish care.  Diagnoses and all orders for this visit:  Dysuria -     Urinalysis -     Urine Culture  Chronic atrial fibrillation (HCC) -     apixaban (ELIQUIS) 5 MG TABS tablet; Take 1 tablet (5 mg total) by mouth 2 (two) times daily. -     CBC with Differential/Platelet  Essential hypertension -     amLODipine (NORVASC) 10 MG tablet; Take 1 tablet (10 mg total) by mouth daily. -     Comprehensive metabolic panel  Other hyperlipidemia -     atorvastatin (LIPITOR) 10 MG tablet; Take 1 tablet (10 mg total) by mouth daily. -     Hemoglobin A1c -     Lipid panel  Chronic pain of right ankle -     Ambulatory referral to Orthopedic Surgery  Pre-diabetes -     Hemoglobin A1c  Erectile dysfunction, unspecified erectile dysfunction type  Midline low back pain without sciatica, unspecified chronicity  Former smoker  Other orders -     sildenafil (VIAGRA) 100 MG tablet; Take 1 tablet (100 mg total) by mouth daily as needed for erectile dysfunction. -     bisoprolol (ZEBETA) 10 MG tablet; Take 1 tablet (10 mg total) by mouth daily.   Patient still has fecal occult kit at home reminded to process this  The patient steadfastly refuses to receive Covid vaccinations or tetanus vaccine   Past Medical History:  Diagnosis Date   . Avulsion fracture of distal fibula 06/02/2013   Right distal fibula occurred 04/30/2013   . Hordeolum externum left upper eyelid 06/29/2019  . Hypertension   . Persistent atrial fibrillation ([place])    a. newly diagnosed on 05/2015 admission. started on coumadin.   . Pre-diabetes    a. HgA1c 6.3 05/2015  . Tobacco abuse      Family History  Problem Relation Age of Onset  . Hypertension Father   . Diabetes Maternal Grandmother   . Hypertension Maternal Grandmother   . Diabetes Maternal Grandfather   . Hypertension Maternal Grandfather      Social History   Socioeconomic History  .  Marital status: Married    Spouse name: Not on file  . Number of children: Not on file  . Years of education: Not on file  . Highest education level: Not on file  Occupational History  . Not on file  Tobacco Use  . Smoking status: Former Smoker    Types: Cigarettes  . Smokeless tobacco: Never Used  Substance and Sexual Activity  . Alcohol use: No  . Drug use: No  . Sexual activity: Not Currently  Other Topics Concern  . Not on file  Social History Narrative  . Not on file   Social Determinants of Health   Financial Resource Strain: Not on file  Food Insecurity: Not on file  Transportation Needs: Not on file  Physical Activity: Not on file  Stress: Not on file  Social Connections: Not on file  Intimate Partner Violence: Not on file     No Known Allergies   Outpatient Medications Prior to Visit  Medication Sig Dispense Refill  . acetaminophen (TYLENOL) 500 MG tablet Take 2 tablets (1,000 mg total) by mouth every 6 (six) hours as needed for headache. 60 tablet 2  . albuterol (PROVENTIL HFA;VENTOLIN HFA) 108 (90 BASE) MCG/ACT inhaler Inhale 1-2 puffs into the lungs every 6 (six) hours as needed for wheezing. 1 Inhaler 0  . amLODipine (NORVASC) 10 MG tablet Take 1 tablet (10 mg total) by mouth daily. 90 tablet 2  . apixaban (ELIQUIS) 5 MG TABS tablet Take 1 tablet (5 mg total) by mouth  2 (two) times daily. 120 tablet 6  . atorvastatin (LIPITOR) 10 MG tablet Take 1 tablet (10 mg total) by mouth daily. 90 tablet 2  . bisoprolol (ZEBETA) 10 MG tablet [id number] 30 tablet 0  . blood glucose meter kit and supplies KIT Dispense based on patient and insurance preference. Use up to four times daily as directed. (FOR ICD-9 250.00, 250.01). 1 each 0  . Blood Pressure Monitoring (5 SERIES BP MONITOR) DEVI 1 Units by Does not apply route daily. Measure blood pressure daily 1 each 0  . Cholecalciferol (DIALYVITE VITAMIN D 5000 PO) Take 1 capsule by mouth daily.    . ciprofloxacin (CIPRO) 500 MG tablet Take 1 tablet (500 mg total) by mouth 2 (two) times daily. 6 tablet 0  . diclofenac Sodium (VOLTAREN) 1 % GEL APPLY 2 G TOPICALLY 4 (FOUR) TIMES DAILY AS NEEDED. 100 g 2  . sildenafil (VIAGRA) 100 MG tablet Take 1 tablet by mouth every day as needed for erectile dysfunction 10 tablet 11   No facility-administered medications prior to visit.    Review of Systems Constitutional:   No  weight loss, night sweats,  Fevers, chills, fatigue, lassitude. HEENT:   No headaches,  Difficulty swallowing,  Tooth/dental problems,  Sore throat,                No sneezing, itching, ear ache, nasal congestion, post nasal drip,   CV:  No chest pain,  Orthopnea, PND, swelling in lower extremities, anasarca, dizziness, palpitations  GI  No heartburn, indigestion, abdominal pain, nausea, vomiting, diarrhea, change in bowel habits, loss of appetite  Resp: No shortness of breath with exertion or at rest.  No excess mucus, no productive cough,  No non-productive cough,  No coughing up of blood.  No change in color of mucus.  No wheezing.  No chest wall deformity  Skin: no rash or lesions.  GU: no dysuria, change in color of urine, no urgency or frequency.  No flank pain. ERECTILE DYSFUNCTION  MS:  o joint pain or swelling.  No decreased range of motion.  No back pain.  Psych:  No change in mood or affect. No  depression or anxiety.  No memory loss.     Objective:   Physical Exam  There were no vitals filed for this visit.Gen: Pleasant, well-nourished, in no distress,  normal affect  ENT: No lesions,  mouth clear,  oropharynx clear, no postnasal drip  Neck: No JVD, no TMG, no carotid bruits  Lungs: No use of accessory muscles, no dullness to percussion, clear without rales or rhonchi  Cardiovascular: RRR, heart sounds normal, no murmur or gallops, no peripheral edema  Abdomen: soft and NT, no HSM,  BS normal  Musculoskeletal: Decreased range of motion right ankle Neuro: alert, non focal  Skin: Warm, no lesions or rashes        Assessment & Plan:  I personally reviewed all images and lab data in the [facility] system as well as any outside material available during this office visit and agree with the  radiology impressions.  Below from yet No problem-specific Assessment & Plan notes found for this encounter.   There are no diagnoses linked to this encounter. Patient still has fecal occult kit at home reminded to process this  The patient steadfastly refuses to receive Covid vaccinations or tetanus vaccine

## 2020-10-10 ENCOUNTER — Ambulatory Visit: Payer: Medicare Other | Admitting: Critical Care Medicine

## 2020-10-18 ENCOUNTER — Other Ambulatory Visit: Payer: Self-pay | Admitting: Critical Care Medicine

## 2020-10-18 MED ORDER — BISOPROLOL FUMARATE 10 MG PO TABS: ORAL_TABLET | ORAL | 0 refills | Status: DC

## 2020-10-18 NOTE — Telephone Encounter (Signed)
Copied from CRM [id number]. Topic: Quick Communication - Rx Refill/Question >> Oct 18, 2020 12:21 PM Jaquita Rector A wrote: Medication: bisoprolol (ZEBETA) 10 MG tablet   Has the patient contacted their pharmacy? Yes.   (Agent: If no, request that the patient contact the pharmacy for the refill.) (Agent: If yes, when and what did the pharmacy advise?)  Preferred Pharmacy (with phone number or street name): [email or web address] [place], [place] - [address]  Phone:  [phone] Fax:  [phone]     Agent: Please be advised that RX refills may take up to 3 business days. We ask that you follow-up with your pharmacy.

## 2020-11-15 ENCOUNTER — Other Ambulatory Visit: Payer: Self-pay | Admitting: Critical Care Medicine

## 2020-11-15 MED ORDER — BISOPROLOL FUMARATE 10 MG PO TABS: ORAL_TABLET | ORAL | 0 refills | Status: DC

## 2020-11-15 NOTE — Telephone Encounter (Signed)
Medication Refill - Medication: bisoprolol (ZEBETA) 10 MG tablet    Preferred Pharmacy (with phone number or street name):  [email or web address] - [place], [place] - [address]   Agent: Please be advised that RX refills may take up to 3 business days. We ask that you follow-up with your pharmacy.

## 2020-12-30 ENCOUNTER — Other Ambulatory Visit: Payer: Self-pay | Admitting: Critical Care Medicine

## 2020-12-30 NOTE — Telephone Encounter (Signed)
Requested medications are due for refill today yes  Requested medications are on the active medication list yes  Last refill 12/14/20  Last visit 07/2020  Future visit scheduled no  Notes to clinic already given 3 months rx, no upcoming visit scheduled

## 2021-02-18 NOTE — Progress Notes (Addendum)
Subjective:   Brian Moody. is a 55 y.o. male who presents for an Initial Medicare Annual Wellness Visit.   I connected with  Brian Moody. on 02/24/21 by an audio only telemedicine application and verified that I am speaking with the correct person using two identifiers.   I discussed the limitations, risks, security and privacy concerns of performing an evaluation and management service by telephone and the availability of in person appointments. I also discussed with the patient that there may be a patient responsible charge related to this service. The patient expressed understanding and verbally consented to this telephonic visit.  Location of Patient: Home Location of Provider: Office  List any persons and their role that are participating in the visit with the patient.   Tiburcio Pea. Shepard General, CMA   Review of Systems    Defer to PCP       Objective:    There were no vitals filed for this visit. There is no height or weight on file to calculate BMI.  Advanced Directives 02/24/2021 10/17/2016 07/19/2016 07/01/2016 04/18/2016 03/29/2016 07/20/2015  Does Patient Have a Medical Advance Directive? _0  No No  Would patient like information on creating a medical advance directive? Yes (MAU/Ambulatory/Procedural Areas - Information given) - Yes (ED - Information included in AVS) - No - patient declined information No - patient declined information -    Current Medications (verified) Outpatient Encounter Medications as of 02/24/2021  Medication Sig   acetaminophen (TYLENOL) 500 MG tablet Take 2 tablets (1,000 mg total) by mouth every 6 (six) hours as needed for headache.   albuterol (PROVENTIL HFA;VENTOLIN HFA) 108 (90 BASE) MCG/ACT inhaler Inhale 1-2 puffs into the lungs every 6 (six) hours as needed for wheezing.   amLODipine (NORVASC) 10 MG tablet Take 1 tablet (10 mg total) by mouth daily.   apixaban (ELIQUIS) 5 MG TABS tablet Take 1 tablet (5 mg  total) by mouth 2 (two) times daily.   atorvastatin (LIPITOR) 10 MG tablet Take 1 tablet (10 mg total) by mouth daily.   bisoprolol (ZEBETA) 10 MG tablet [id number]   blood glucose meter kit and supplies KIT Dispense based on patient and insurance preference. Use up to four times daily as directed. (FOR ICD-9 250.00, 250.01).   Blood Pressure Monitoring (5 SERIES BP MONITOR) DEVI 1 Units by Does not apply route daily. Measure blood pressure daily   Cholecalciferol (DIALYVITE VITAMIN D 5000 PO) Take 1 capsule by mouth daily.   ciprofloxacin (CIPRO) 500 MG tablet Take 1 tablet (500 mg total) by mouth 2 (two) times daily.   diclofenac Sodium (VOLTAREN) 1 % GEL APPLY 2 G TOPICALLY 4 (FOUR) TIMES DAILY AS NEEDED.   sildenafil (VIAGRA) 100 MG tablet Take 1 tablet by mouth every day as needed for erectile dysfunction   [DISCONTINUED] carvedilol (COREG) 12.5 MG tablet Take 1 tablet (12.5 mg total) by mouth 2 (two) times daily with a meal.   No facility-administered encounter medications on file as of 02/24/2021.    Allergies (verified) Patient has no known allergies.   History: Past Medical History:  Diagnosis Date   Avulsion fracture of distal fibula 06/02/2013   Right distal fibula occurred 04/30/2013    Hordeolum externum left upper eyelid 06/29/2019   Hypertension    Persistent atrial fibrillation ([place])    a. newly diagnosed on 05/2015 admission. started on coumadin.    Pre-diabetes    a. HgA1c 6.3 05/2015  Tobacco abuse    History reviewed. No pertinent surgical history. Family History  Problem Relation Age of Onset   Hypertension Father    Diabetes Maternal Grandmother    Hypertension Maternal Grandmother    Diabetes Maternal Grandfather    Hypertension Maternal Grandfather    Social History   Socioeconomic History   Marital status: Married    Spouse name: Not on file   Number of children: Not on file   Years of education: Not on file   Highest education level: Not on file   Occupational History   Not on file  Tobacco Use   Smoking status: Former    Types: Cigarettes   Smokeless tobacco: Never  Substance and Sexual Activity   Alcohol use: No   Drug use: No   Sexual activity: Not Currently  Other Topics Concern   Not on file  Social History Narrative   Not on file   Social Determinants of Health   Financial Resource Strain: Not on file  Food Insecurity: Not on file  Transportation Needs: Not on file  Physical Activity: Not on file  Stress: Not on file  Social Connections: Not on file    Tobacco Counseling Counseling given: Not Answered   Clinical Intake:  Pre-visit preparation completed: Yes  Pain : No/denies pain     Nutritional Risks: None Diabetes: No  How often do you need to have someone help you when you read instructions, pamphlets, or other written materials from your doctor or pharmacy?: 1 - Never  Diabetic?no  Interpreter Needed?: No      Activities of Daily Living In your present state of health, do you have any difficulty performing the following activities: 02/24/2021  Hearing? N  Vision? N  Difficulty concentrating or making decisions? N  Walking or climbing stairs? Y  Dressing or bathing? N  Doing errands, shopping? N  Preparing Food and eating ? N  Using the Toilet? N  In the past six months, have you accidently leaked urine? N  Do you have problems with loss of bowel control? N  Managing your Medications? N  Managing your Finances? N  Housekeeping or managing your Housekeeping? N  Some recent data might be hidden    Patient Care Team: Elsie Stain, MD as PCP - General (Pulmonary Disease)  Indicate any recent Medical Services you may have received from other than [facility] providers in the past year (date may be approximate).     Assessment:   This is a routine wellness examination for Brian Moody.  Hearing/Vision screen No results found.  Dietary issues and exercise activities discussed: Current  Exercise Habits: Home exercise routine, Type of exercise: stretching, Exercise limited by: None identified   Goals Addressed   None    Depression Screen PHQ 2/9 Scores 02/24/2021 02/24/2021 08/14/2020 05/05/2020 03/02/2020 12/10/2018 09/03/2018  PHQ - 2 Score 0 0 - 0 0 0 0  PHQ- 9 Score - - - - - - -  Exception Documentation - - Patient refusal - - - -    Fall Risk Fall Risk  02/24/2021 05/05/2020 03/02/2020 07/21/2017 08/24/2013  Falls in the past year? 0 1 1 Yes Yes  Number falls in past yr: 0 _0 or more -  Injury with Fall? 0 0 0 No -  Risk for fall due to : - - Impaired mobility;History of fall(s) Impaired balance/gait -  Risk for fall due to: Comment - - - pt states he feel when he ankle lock  up and when it cold. Pt states he broke his ankle the one that give out -  Follow up Falls evaluation completed - Education provided - -    FALL RISK PREVENTION PERTAINING TO THE HOME:  Any stairs in or around the home? Yes  If so, are there any without handrails? Yes  Home free of loose throw rugs in walkways, pet beds, electrical cords, etc? Yes  Adequate lighting in your home to reduce risk of falls? Yes   ASSISTIVE DEVICES UTILIZED TO PREVENT FALLS:  Life alert? No  Use of a cane, walker or w/c? Yes  Grab bars in the bathroom? No  Shower chair or bench in shower? No  Elevated toilet seat or a handicapped toilet? No   TIMED UP AND GO:  Was the test performed?  n/a .  Length of time to ambulate 10 feet: n/a sec.   *These questions cannot be answered via Telephone Encounter.  Cognitive Function:     6CIT Screen 02/24/2021  What Year? 0 points  What month? 0 points  What time? 0 points  Count back from 20 0 points  Months in reverse 0 points    Immunizations  There is no immunization history on file for this patient.  TDAP status: Up to date  Flu Vaccine status: Declined, Education has been provided regarding the importance of this vaccine but patient still declined. Advised  may receive this vaccine at local pharmacy or Health Dept. Aware to provide a copy of the vaccination record if obtained from local pharmacy or Health Dept. Verbalized acceptance and understanding.  Pneumococcal vaccine status: Up to date  Covid-19 vaccine status: Declined, Education has been provided regarding the importance of this vaccine but patient still declined. Advised may receive this vaccine at local pharmacy or Health Dept.or vaccine clinic. Aware to provide a copy of the vaccination record if obtained from local pharmacy or Health Dept. Verbalized acceptance and understanding.  Qualifies for Shingles Vaccine? Yes   Zostavax completed No   Shingrix Completed?: No.    Education has been provided regarding the importance of this vaccine. Patient has been advised to call insurance company to determine out of pocket expense if they have not yet received this vaccine. Advised may also receive vaccine at local pharmacy or Health Dept. Verbalized acceptance and understanding.  Screening Tests Health Maintenance  Topic Date Due   COLON CANCER SCREENING ANNUAL FOBT  Never done   Zoster Vaccines- Shingrix (1 of 2) Never done   INFLUENZA VACCINE  Never done   TETANUS/TDAP  08/14/2021 (Originally 12/24/1984)   HIV Screening  Completed   Pneumococcal Vaccine 74-30 Years old  Aged Out   HPV VACCINES  Aged Out   COLONOSCOPY (Pts 45-86yr Insurance coverage will need to be confirmed)  Discontinued   COVID-19 Vaccine  Discontinued   Hepatitis C Screening  Discontinued    Health Maintenance  Health Maintenance Due  Topic Date Due   COLON CANCER SCREENING ANNUAL FOBT  Never done   Zoster Vaccines- Shingrix (1 of 2) Never done   INFLUENZA VACCINE  Never done    Colorectal cancer screening: No longer required.   Lung Cancer Screening: (Low Dose CT Chest recommended if Age 56-80years, 30 pack-year currently smoking OR have quit w/in 15years.) does not qualify.   Lung Cancer Screening  Referral: n/a  Additional Screening:  Hepatitis C Screening: does not qualify; Completed 03/02/20  Vision Screening: Recommended annual ophthalmology exams for early detection of glaucoma and  other disorders of the eye. Is the patient up to date with their annual eye exam?  Yes  Who is the provider or what is the name of the office in which the patient attends annual eye exams? N/A If pt is not established with a provider, would they like to be referred to a provider to establish care? Yes .   Dental Screening: Recommended annual dental exams for proper oral hygiene  Community Resource Referral / Chronic Care Management: CRR required this visit?  No   CCM required this visit?  Yes      Plan:     I have personally reviewed and noted the following in the patient's chart:   Medical and social history Use of alcohol, tobacco or illicit drugs  Current medications and supplements including opioid prescriptions. Patient is not currently taking opioid prescriptions. Functional ability and status Nutritional status Physical activity Advanced directives List of other physicians Hospitalizations, surgeries, and ER visits in previous 12 months Vitals Screenings to include cognitive, depression, and falls Referrals and appointments  In addition, I have reviewed and discussed with patient certain preventive protocols, quality metrics, and best practice recommendations. A written personalized care plan for preventive services as well as general preventive health recommendations were provided to patient.     Octaviano Glow, CMA   02/24/2021   Nurse Notes: Non-Face to Face 20 minute visit Encounter.   Mr. Pflaum , Thank you for taking time to come for your Medicare Wellness Visit. I appreciate your ongoing commitment to your health goals. Please review the following plan we discussed and let me know if I can assist you in the future.   These are the goals we discussed:  Goals       Blood Pressure < 140/90     Quit smoking / using tobacco        This is a list of the screening recommended for you and due dates:  Health Maintenance  Topic Date Due   Stool Blood Test  Never done   Zoster (Shingles) Vaccine (1 of 2) Never done   Flu Shot  Never done   Tetanus Vaccine  08/14/2021*   HIV Screening  Completed   Pneumococcal Vaccination  Aged Out   HPV Vaccine  Aged Out   Colon Cancer Screening  Discontinued   COVID-19 Vaccine  Discontinued   Hepatitis C Screening: USPSTF Recommendation to screen - Ages 28-79 yo.  Discontinued  *Topic was postponed. The date shown is not the original due date.

## 2021-02-21 ENCOUNTER — Ambulatory Visit: Payer: Medicare Other | Admitting: Physician Assistant

## 2021-02-24 ENCOUNTER — Ambulatory Visit (HOSPITAL_BASED_OUTPATIENT_CLINIC_OR_DEPARTMENT_OTHER): Payer: Medicare Other

## 2021-02-24 DIAGNOSIS — Z0000 Encounter for general adult medical examination without abnormal findings: Secondary | ICD-10-CM

## 2021-03-11 ENCOUNTER — Other Ambulatory Visit: Payer: Self-pay | Admitting: Critical Care Medicine

## 2021-03-11 DIAGNOSIS — E7849 Other hyperlipidemia: Secondary | ICD-10-CM

## 2021-03-11 DIAGNOSIS — I10 Essential (primary) hypertension: Secondary | ICD-10-CM

## 2021-03-12 ENCOUNTER — Telehealth: Payer: Self-pay | Admitting: *Deleted

## 2021-03-12 NOTE — Telephone Encounter (Signed)
I called pt and left a voicemail for him to call [facility] and wellness and make an appt for his 6 month check up.   Phone left with voicemail.   Courtesy refills will be given.

## 2021-04-10 ENCOUNTER — Other Ambulatory Visit: Payer: Self-pay | Admitting: Critical Care Medicine

## 2021-04-10 NOTE — Telephone Encounter (Signed)
Requested medications are due for refill today yes  Requested medications are on the active medication list yes  Last refill 06/25/1999??  Last visit 08/14/20  Future visit scheduled no  Notes to clinic Has already had a curtesy refill and there is no upcoming appointment scheduled.  . Requested Prescriptions  Pending Prescriptions Disp Refills   bisoprolol (ZEBETA) 10 MG tablet [Pharmacy Med Name: Bisoprolol Fumarate 10mg  Tablet] 30 tablet 11    Sig: Take 1 tablet by mouth every day     Cardiovascular:  Beta Blockers Failed - 04/10/2021  2:04 AM      Failed - Last BP in normal range    BP Readings from Last 1 Encounters:  08/14/20 (!) 152/89          Failed - Valid encounter within last 6 months    Recent Outpatient Visits           7 months ago Dysuria   [facility], MD   11 months ago Chronic pain of right ankle   [facility], MD   1 year ago Essential hypertension   [facility] Marcine Matar, MD   1 year ago Hordeolum externum left upper eyelid   [facility] Storm Frisk, MD   1 year ago Essential hypertension   [facility], MD              Passed - Last Heart Rate in normal range    Pulse Readings from Last 1 Encounters:  08/14/20 91

## 2021-05-17 ENCOUNTER — Other Ambulatory Visit: Payer: Self-pay | Admitting: Critical Care Medicine

## 2021-05-17 DIAGNOSIS — E7849 Other hyperlipidemia: Secondary | ICD-10-CM

## 2021-05-17 DIAGNOSIS — I10 Essential (primary) hypertension: Secondary | ICD-10-CM

## 2021-05-17 NOTE — Telephone Encounter (Signed)
Requested medications are due for refill today.  yes  Requested medications are on the active medications list.  yes  Last refill. 03/12/2021  Future visit scheduled.   no  Notes to clinic.  Patient already given a courtesy refill.

## 2021-05-17 NOTE — Telephone Encounter (Signed)
Requested medications are due for refill today.  yes  Requested medications are on the active medications list.  yes  Last refill. 03/12/2021  Future visit scheduled.   no  Notes to clinic.  Courtesy refill already given.

## 2021-06-15 ENCOUNTER — Other Ambulatory Visit: Payer: Self-pay | Admitting: Critical Care Medicine

## 2021-06-15 DIAGNOSIS — I482 Chronic atrial fibrillation, unspecified: Secondary | ICD-10-CM

## 2021-06-15 NOTE — Telephone Encounter (Signed)
Requested medication (s) are due for refill today: Should still have one refill, it  was just filled, it is mail order.  Requested medication (s) are on the active medication list: yes  Last refill:  06/11/21  Future visit scheduled: no, has had two NO SHOW appts since was seen, no upcoming visit.  Notes to clinic:  Please assess.   Requested Prescriptions  Pending Prescriptions Disp Refills   ELIQUIS 5 MG TABS tablet [Pharmacy Med Name: Eliquis 5mg  Tablet] 60 tablet 11    Sig: Take 1 tablet by mouth twice daily     Hematology:  Anticoagulants Passed - 06/15/2021  1:04 AM      Passed - HGB in normal range and within 360 days    Hemoglobin  Date Value Ref Range Status  08/14/2020 16.2 13.0 - 17.7 g/dL Final          Passed - PLT in normal range and within 360 days    Platelets  Date Value Ref Range Status  08/14/2020 244 150 - 450 x10E3/uL Final          Passed - HCT in normal range and within 360 days    Hematocrit  Date Value Ref Range Status  08/14/2020 49.2 37.5 - 51.0 % Final          Passed - Cr in normal range and within 360 days    Creat  Date Value Ref Range Status  12/04/2015 1.15 0.70 - 1.33 mg/dL Final    Comment:      For patients > or = 56 years of age: The upper reference limit for Creatinine is approximately 13% higher for people identified as African-American.      Creatinine, Ser  Date Value Ref Range Status  08/14/2020 1.08 0.76 - 1.27 mg/dL Final    Comment:                   **Effective August 21, 2020 [facility] will begin**                  reporting the 2021 CKD-EPI creatinine equation that                  estimates kidney function without a race variable.           Passed - Valid encounter within last 12 months    Recent Outpatient Visits           10 months ago Dysuria   [facility] 2022, MD   1 year ago Chronic pain of right ankle   [facility]  [facility], MD   1 year ago Essential hypertension   [facility] Marcine Matar, MD   1 year ago Hordeolum externum left upper eyelid   [facility] Storm Frisk, MD   2 years ago Essential hypertension   [facility], MD

## 2021-06-28 ENCOUNTER — Other Ambulatory Visit: Payer: Self-pay

## 2021-06-28 ENCOUNTER — Ambulatory Visit (HOSPITAL_COMMUNITY)
Admission: EM | Admit: 2021-06-28 | Discharge: 2021-06-28 | Disposition: A | Discharge status: Home / Self Care | Payer: Medicare Other | Attending: Student | Admitting: Student

## 2021-06-28 ENCOUNTER — Encounter (HOSPITAL_COMMUNITY): Payer: Self-pay | Admitting: Emergency Medicine

## 2021-06-28 DIAGNOSIS — Z760 Encounter for issue of repeat prescription: Secondary | ICD-10-CM | POA: Diagnosis not present

## 2021-06-28 DIAGNOSIS — I10 Essential (primary) hypertension: Secondary | ICD-10-CM | POA: Insufficient documentation

## 2021-06-28 DIAGNOSIS — N10 Acute tubulo-interstitial nephritis: Secondary | ICD-10-CM | POA: Diagnosis not present

## 2021-06-28 LAB — BASIC METABOLIC PANEL
Anion gap: 9 (ref 5–15)
BUN: 11 mg/dL (ref 6–20)
CO2: 27 mmol/L (ref 22–32)
Calcium: 8.8 mg/dL — ABNORMAL LOW (ref 8.9–10.3)
Chloride: 100 mmol/L (ref 98–111)
Creatinine, Ser: 1 mg/dL (ref 0.61–1.24)
GFR, Estimated: >60 mL/min (ref >60)
Glucose, Bld: 76 mg/dL (ref 70–99)
Potassium: 3.8 mmol/L (ref 3.5–5.1)
Sodium: 136 mmol/L (ref 135–145)

## 2021-06-28 LAB — POCT URINALYSIS DIPSTICK, ED / UC
Glucose, UA: NEGATIVE mg/dL
Nitrite: NEGATIVE
Protein, ur: NEGATIVE mg/dL
Specific Gravity, Urine: >=1.03 (ref 1.005–1.030)
Urobilinogen, UA: 0.2 mg/dL (ref 0.0–1.0)
pH: 5.5 (ref 5.0–8.0)

## 2021-06-28 MED ORDER — AMLODIPINE BESYLATE 10 MG PO TABS: 10.0000 mg | ORAL_TABLET | Freq: Every day | ORAL | 0 refills | Status: DC

## 2021-06-28 MED ORDER — SULFAMETHOXAZOLE-TRIMETHOPRIM 800-160 MG PO TABS
1.0000 | ORAL_TABLET | Freq: Two times a day (BID) | ORAL | 0 refills | Status: AC
Start: 2021-06-28 — End: 2021-07-12

## 2021-06-28 NOTE — Discharge Instructions (Addendum)
-  Please check your blood pressure at home or at the pharmacy. If this continues to be >140/90, follow-up with your primary care provider for further blood pressure management/ medication titration. If you develop chest pain, shortness of breath, vision changes, the worst headache of your life- head straight to the ED or call 911. -Follow-up with PCP as scheduled 07/02/2021 -For your kidney function - Start the antibiotic-Bactrim (trimethoprim-sulfamethoxazole) twice daily x14 days. Take with food if you have a sensitive stomach. Head to the ED if symptoms worsen - abd pain, back pain, fevers/chills, etc.

## 2021-06-28 NOTE — ED Provider Notes (Signed)
[place]    CSN: [id number] Arrival date & time: 06/28/21  1442      History   Chief Complaint Chief Complaint  Patient presents with   Back Pain    HPI Brian Moody. is a 56 y.o. male presenting with R flank pain x5 days since 06/23/2021.  Patient notes right-sided flank pain and urinary weak stream for about 5 days following New Year's holiday.  States he did have a kidney infection about 2 months ago, related to not drinking enough water and drinking too much soda.  States that he has been drinking a lot of sodas again, and so thinks that the kidney infection is back.  Denies STI risk. Denies hematuria, dysuria, frequency, urgency, n/v/d/abd pain, fevers/chills, abdnormal penile discharge.  Also requesting refill on his amlodipine, he has been out of this for 1 month.  Denies headaches, dizziness, chest pain, abdominal pain, vision changes.  Still taking the Eliquis as directed.   HPI  Past Medical History:  Diagnosis Date   Avulsion fracture of distal fibula 06/02/2013   Right distal fibula occurred 04/30/2013    Hordeolum externum left upper eyelid 06/29/2019   Hypertension    Persistent atrial fibrillation ([place])    a. newly diagnosed on 05/2015 admission. started on coumadin.    Pre-diabetes    a. HgA1c 6.3 05/2015   Tobacco abuse     Patient Active Problem List   Diagnosis Date Noted   Dysuria 08/14/2020   Chronic pain of right ankle 08/14/2020   Erectile dysfunction 05/11/2019   Chronic atrial fibrillation ([place]) 09/03/2018   Midline low back pain without sciatica 03/29/2016   Obesity (BMI 30-39.9) 03/29/2016   Bilateral foot pain 07/20/2015   Essential hypertension    Former smoker    Pre-diabetes    Vitamin D insufficiency 08/24/2013   Other hyperlipidemia 08/24/2013    History reviewed. No pertinent surgical history.     Home Medications    Prior to Admission medications   Medication Sig Start Date End Date Taking? Authorizing  Provider  amLODipine (NORVASC) 10 MG tablet Take 1 tablet (10 mg total) by mouth daily for 5 days. 06/28/21 07/03/21 Yes Hazel Sams, PA-C  apixaban (ELIQUIS) 5 MG TABS tablet Take 1 tablet (5 mg total) by mouth 2 (two) times daily. Must have office visit for refills 06/15/21   Elsie Stain, MD  sulfamethoxazole-trimethoprim (BACTRIM DS) 800-160 MG tablet Take 1 tablet by mouth 2 (two) times daily for 14 days. 06/28/21 07/12/21 Yes Hazel Sams, PA-C  acetaminophen (TYLENOL) 500 MG tablet Take 2 tablets (1,000 mg total) by mouth every 6 (six) hours as needed for headache. 05/05/20   Ladell Pier, MD  albuterol (PROVENTIL HFA;VENTOLIN HFA) 108 (90 BASE) MCG/ACT inhaler Inhale 1-2 puffs into the lungs every 6 (six) hours as needed for wheezing. 07/01/14   Tanna Furry, MD  atorvastatin (LIPITOR) 10 MG tablet Take 1 tablet by mouth every day 03/12/21   Elsie Stain, MD  bisoprolol (ZEBETA) 10 MG tablet Take 1 tablet by mouth every day 03/12/21   Elsie Stain, MD  blood glucose meter kit and supplies KIT Dispense based on patient and insurance preference. Use up to four times daily as directed. (FOR ICD-9 250.00, 250.01). 06/29/19   Elsie Stain, MD  Blood Pressure Monitoring (5 SERIES BP MONITOR) DEVI 1 Units by Does not apply route daily. Measure blood pressure daily 06/29/19   Elsie Stain, MD  Cholecalciferol (DIALYVITE VITAMIN D 5000 PO) Take 1 capsule by mouth daily.    [provider]  ciprofloxacin (CIPRO) 500 MG tablet Take 1 tablet (500 mg total) by mouth 2 (two) times daily. 08/15/20   Elsie Stain, MD  sildenafil (VIAGRA) 100 MG tablet Take 1 tablet by mouth every day as needed for erectile dysfunction 10/01/20   Elsie Stain, MD  carvedilol (COREG) 12.5 MG tablet Take 1 tablet (12.5 mg total) by mouth 2 (two) times daily with a meal. 03/02/20 08/14/20  Elsie Stain, MD    Family History Family History  Problem Relation Age of Onset   Hypertension  Father    Diabetes Maternal Grandmother    Hypertension Maternal Grandmother    Diabetes Maternal Grandfather    Hypertension Maternal Grandfather     Social History Social History   Tobacco Use   Smoking status: Former    Types: Cigarettes   Smokeless tobacco: Never  Vaping Use   Vaping Use: Never used  Substance Use Topics   Alcohol use: No   Drug use: No     Allergies   Patient has no known allergies.   Review of Systems Review of Systems  Constitutional:  Negative for appetite change, chills, diaphoresis and fever.  Respiratory:  Negative for shortness of breath.   Cardiovascular:  Negative for chest pain.  Gastrointestinal:  Negative for abdominal pain, blood in stool, constipation, diarrhea, nausea and vomiting.  Genitourinary:  Positive for flank pain. Negative for decreased urine volume, difficulty urinating, dysuria, frequency, genital sores, hematuria and urgency.  Musculoskeletal:  Negative for back pain.  Neurological:  Negative for dizziness, weakness and light-headedness.  All other systems reviewed and are negative.   Physical Exam Triage Vital Signs ED Triage Vitals  Enc Vitals Group     BP 06/28/21 1625 (!) 153/105     Pulse Rate 06/28/21 1625 88     Resp 06/28/21 1625 20     Temp 06/28/21 1625 98.5 F (36.9 C)     Temp Source 06/28/21 1625 Oral     SpO2 06/28/21 1625 98 %     Weight --      Height --      Head Circumference --      Peak Flow --      Pain Score 06/28/21 1621 9     Pain Loc --      Pain Edu? --      Excl. in [place]? --    No data found.  Updated Vital Signs BP (!) 153/105 (BP Location: Left Arm) Comment: does not have blood pressure medicine   Pulse 88    Temp 98.5 F (36.9 C) (Oral)    Resp 20    SpO2 98%   Visual Acuity Right Eye Distance:   Left Eye Distance:   Bilateral Distance:    Right Eye Near:   Left Eye Near:    Bilateral Near:     Physical Exam Vitals reviewed.  Constitutional:      General: He is not  in acute distress.    Appearance: Normal appearance. He is not ill-appearing.  HENT:     Head: Normocephalic and atraumatic.     Mouth/Throat:     Mouth: Mucous membranes are moist.     Comments: Moist mucous membranes Eyes:     Extraocular Movements: Extraocular movements intact.     Pupils: Pupils are equal, round, and reactive to light.  Cardiovascular:  Rate and Rhythm: Normal rate and regular rhythm.     Heart sounds: Normal heart sounds.  Pulmonary:     Effort: Pulmonary effort is normal.     Breath sounds: Normal breath sounds. No wheezing, rhonchi or rales.  Abdominal:     General: Abdomen is protuberant. Bowel sounds are normal. There is no distension.     Palpations: Abdomen is soft. There is no mass.     Tenderness: There is no abdominal tenderness. There is right CVA tenderness. There is no left CVA tenderness, guarding or rebound. Negative signs include Murphy's sign, Rovsing's sign and McBurney's sign.  Skin:    General: Skin is warm.     Capillary Refill: Capillary refill takes less than 2 seconds.     Comments: Good skin turgor  Neurological:     General: No focal deficit present.     Mental Status: He is alert and oriented to person, place, and time.  Psychiatric:        Mood and Affect: Mood normal.        Behavior: Behavior normal.     UC Treatments / Results  Labs (all labs ordered are listed, but only abnormal results are displayed) Labs Reviewed  POCT URINALYSIS DIPSTICK, ED / UC - Abnormal; Notable for the following components:      Result Value   Bilirubin Urine SMALL (*)    Ketones, ur TRACE (*)    Hgb urine dipstick TRACE (*)    Leukocytes,Ua SMALL (*)    All other components within normal limits  URINE CULTURE  BASIC METABOLIC PANEL    EKG   Radiology No results found.  Procedures Procedures (including critical care time)  Medications Ordered in UC Medications - No data to display  Initial Impression / Assessment and Plan / UC  Course  I have reviewed the triage vital signs and the nursing notes.  Pertinent labs & imaging results that were available during my care of the patient were reviewed by me and considered in my medical decision making (see chart for details).  Clinical Course as of 06/28/21 1654  Thu Jun 28, 2021  1637 Nitrite: NEGATIVE [LG]    Clinical Course User Index [LG] Hazel Sams, PA-C    This patient is a very pleasant 56 y.o. year old male presenting with acute pyelonephritis and medication refill. Afebrile, nontachy. R CVAT.   UA with trace blood, small leuk, small bili, trace ketones. Culture sent.  Will check a BMP. Denies STI risk.   Kidney function wnl 07/2020. Bactrim x14 days sent.   F/u with PCP within 1 week for recheck, or ED if symptoms worsen before then.  Out of BP medication x1 month. Refilled amlodipine x5 days. He has an appt with PCP on 07/02/20.   ED return precautions discussed. Patient verbalizes understanding and agreement.   Coding Level 4 for acute illness with systemic symptoms, and prescription drug management  Discussed treatment plan with attending physician Dr. Alphonzo Cruise who is in agreement.  Final Clinical Impressions(s) / UC Diagnoses   Final diagnoses:  Acute pyelonephritis  Essential hypertension  Medication refill     Discharge Instructions      -Please check your blood pressure at home or at the pharmacy. If this continues to be >140/90, follow-up with your primary care provider for further blood pressure management/ medication titration. If you develop chest pain, shortness of breath, vision changes, the worst headache of your life- head straight to the ED or call  911. -Follow-up with PCP as scheduled 07/02/2021 -For your kidney function - Start the antibiotic-Bactrim (trimethoprim-sulfamethoxazole) twice daily x14 days. Take with food if you have a sensitive stomach. Head to the ED if symptoms worsen - abd pain, back pain, fevers/chills, etc.        ED Prescriptions     Medication Sig Dispense Auth. Provider   amLODipine (NORVASC) 10 MG tablet Take 1 tablet (10 mg total) by mouth daily for 5 days. 5 tablet Hazel Sams, PA-C   sulfamethoxazole-trimethoprim (BACTRIM DS) 800-160 MG tablet Take 1 tablet by mouth 2 (two) times daily for 14 days. 28 tablet Hazel Sams, PA-C      PDMP not reviewed this encounter.   Hazel Sams, PA-C 06/28/21 1658

## 2021-06-28 NOTE — ED Notes (Signed)
Urine in lab 

## 2021-06-28 NOTE — ED Triage Notes (Signed)
Right mid back/flank pain that started around new years.  Reports difficulty emptying bladder.

## 2021-06-29 LAB — URINE CULTURE: Culture: NO GROWTH

## 2021-07-01 NOTE — Progress Notes (Signed)
Established Patient Office Visit  Subjective:  Patient ID: Brian Moody., male    DOB: [birth year]  Age: 56 y.o. MRN: [id number]  CC:  Chief Complaint  Patient presents with   Hypertension    HPI Brian Moody. presents for follow-up of hypertension.  Patient became confused as to whether he should be taking his blood pressure medicines daily or not but tickly when his insurance company sent him a mail order supply of prepackaged medications.  He has not been on his bisoprolol or amlodipine for about 2 weeks and on arrival blood pressure is 148/83.  When he has been compliant with his medicines blood pressure has been well controlled.  He currently does not have any symptoms referable to the blood pressure.  He is also still smoking 5 cigarettes daily.  Patient was in the emergency room last week for acute onset of pyelonephritis with left-sided flank pain.  He is now taking a course of Bactrim and has seen improvement in symptoms.  He is trying to hydrate himself more at this time.  Patient did declined the flu vaccine at this visit  Patient is due colon cancer screening and was sent a Cologuard kit but did not process it properly.  We will have him do a fecal occult kit here in the office.    Past Medical History:  Diagnosis Date   Avulsion fracture of distal fibula 06/02/2013   Right distal fibula occurred 04/30/2013    Dysuria 08/14/2020   Hordeolum externum left upper eyelid 06/29/2019   Hypertension    Persistent atrial fibrillation ([place])    a. newly diagnosed on 05/2015 admission. started on coumadin.    Pre-diabetes    a. HgA1c 6.3 05/2015   Tobacco abuse     History reviewed. No pertinent surgical history.  Family History  Problem Relation Age of Onset   Hypertension Father    Diabetes Maternal Grandmother    Hypertension Maternal Grandmother    Diabetes Maternal Grandfather    Hypertension Maternal Grandfather     Social History   Socioeconomic  History   Marital status: Married    Spouse name: Not on file   Number of children: Not on file   Years of education: Not on file   Highest education level: Not on file  Occupational History   Not on file  Tobacco Use   Smoking status: Every Day    Packs/day: 0.10    Types: Cigarettes    Passive exposure: Never   Smokeless tobacco: Never  Vaping Use   Vaping Use: Never used  Substance and Sexual Activity   Alcohol use: No   Drug use: No   Sexual activity: Not Currently  Other Topics Concern   Not on file  Social History Narrative   Not on file   Social Determinants of Health   Financial Resource Strain: Not on file  Food Insecurity: Not on file  Transportation Needs: Not on file  Physical Activity: Not on file  Stress: Not on file  Social Connections: Not on file  Intimate Partner Violence: Not on file    Outpatient Medications Prior to Visit  Medication Sig Dispense Refill   acetaminophen (TYLENOL) 500 MG tablet Take 2 tablets (1,000 mg total) by mouth every 6 (six) hours as needed for headache. 60 tablet 2   amLODipine (NORVASC) 10 MG tablet Take 1 tablet (10 mg total) by mouth daily for 5 days. 5 tablet 0   apixaban (ELIQUIS) 5  MG TABS tablet Take 1 tablet (5 mg total) by mouth 2 (two) times daily. Must have office visit for refills 60 tablet 0   Blood Pressure Monitoring (5 SERIES BP MONITOR) DEVI 1 Units by Does not apply route daily. Measure blood pressure daily 1 each 0   sulfamethoxazole-trimethoprim (BACTRIM DS) 800-160 MG tablet Take 1 tablet by mouth 2 (two) times daily for 14 days. 28 tablet 0   atorvastatin (LIPITOR) 10 MG tablet Take 1 tablet by mouth every day 30 tablet 0   bisoprolol (ZEBETA) 10 MG tablet Take 1 tablet by mouth every day 30 tablet 0   blood glucose meter kit and supplies KIT Dispense based on patient and insurance preference. Use up to four times daily as directed. (FOR ICD-9 250.00, 250.01). 1 each 0   Cholecalciferol (DIALYVITE VITAMIN  D 5000 PO) Take 1 capsule by mouth daily.     albuterol (PROVENTIL HFA;VENTOLIN HFA) 108 (90 BASE) MCG/ACT inhaler Inhale 1-2 puffs into the lungs every 6 (six) hours as needed for wheezing. 1 Inhaler 0   ciprofloxacin (CIPRO) 500 MG tablet Take 1 tablet (500 mg total) by mouth 2 (two) times daily. 6 tablet 0   sildenafil (VIAGRA) 100 MG tablet Take 1 tablet by mouth every day as needed for erectile dysfunction 10 tablet 11   No facility-administered medications prior to visit.    No Known Allergies  ROS Review of Systems  Constitutional:  Negative for chills, diaphoresis and fever.  HENT:  Negative for congestion, hearing loss, nosebleeds, sore throat and tinnitus.   Eyes:  Negative for photophobia and redness.  Respiratory:  Negative for cough, shortness of breath, wheezing and stridor.   Cardiovascular:  Negative for chest pain, palpitations and leg swelling.  Gastrointestinal:  Negative for abdominal pain, blood in stool, constipation, diarrhea, nausea and vomiting.  Endocrine: Negative for polydipsia.  Genitourinary:  Positive for dysuria and flank pain. Negative for frequency, hematuria and urgency.  Musculoskeletal:  Negative for back pain, myalgias and neck pain.  Skin:  Negative for rash.  Allergic/Immunologic: Negative for environmental allergies.  Neurological:  Negative for dizziness, tremors, seizures, weakness and headaches.  Hematological:  Does not bruise/bleed easily.  Psychiatric/Behavioral:  Negative for suicidal ideas. The patient is not nervous/anxious.      Objective:    Physical Exam Vitals reviewed.  Constitutional:      Appearance: Normal appearance. He is well-developed. He is not diaphoretic.  HENT:     Head: Normocephalic and atraumatic.     Nose: No nasal deformity, septal deviation, mucosal edema or rhinorrhea.     Right Sinus: No maxillary sinus tenderness or frontal sinus tenderness.     Left Sinus: No maxillary sinus tenderness or frontal sinus  tenderness.     Mouth/Throat:     Pharynx: No oropharyngeal exudate.  Eyes:     General: No scleral icterus.    Conjunctiva/sclera: Conjunctivae normal.     Pupils: Pupils are equal, round, and reactive to light.  Neck:     Thyroid: No thyromegaly.     Vascular: No carotid bruit or JVD.     Trachea: Trachea normal. No tracheal tenderness or tracheal deviation.  Cardiovascular:     Rate and Rhythm: Normal rate and regular rhythm.     Chest Wall: PMI is not displaced.     Pulses: Normal pulses. No decreased pulses.     Heart sounds: Normal heart sounds, S1 normal and S2 normal. Heart sounds not distant. No murmur heard.  No systolic murmur is present.  No diastolic murmur is present.    No friction rub. No gallop. No S3 or S4 sounds.  Pulmonary:     Effort: Pulmonary effort is normal. No tachypnea, accessory muscle usage or respiratory distress.     Breath sounds: Normal breath sounds. No stridor. No decreased breath sounds, wheezing, rhonchi or rales.  Chest:     Chest wall: No tenderness.  Abdominal:     General: Bowel sounds are normal. There is no distension.     Palpations: Abdomen is soft. Abdomen is not rigid.     Tenderness: There is no abdominal tenderness. There is right CVA tenderness. There is no left CVA tenderness, guarding or rebound.     Comments: Right costovertebral angle tenderness is improved from documentation in the emergency room  Musculoskeletal:        General: Normal range of motion.     Cervical back: Normal range of motion and neck supple. No edema, erythema or rigidity. No muscular tenderness. Normal range of motion.     Right lower leg: No edema.     Left lower leg: No edema.  Lymphadenopathy:     Head:     Right side of head: No submental or submandibular adenopathy.     Left side of head: No submental or submandibular adenopathy.     Cervical: No cervical adenopathy.  Skin:    General: Skin is warm and dry.     Coloration: Skin is not pale.      Findings: No rash.     Nails: There is no clubbing.  Neurological:     General: No focal deficit present.     Mental Status: He is alert and oriented to person, place, and time. Mental status is at baseline.     Sensory: No sensory deficit.  Psychiatric:        Mood and Affect: Mood normal.        Speech: Speech normal.        Behavior: Behavior normal.        Thought Content: Thought content normal.    BP (!) 148/83    Pulse 90    Resp 16    Wt 263 lb 9.6 oz (119.6 kg)    SpO2 98%    BMI 37.29 kg/m  Wt Readings from Last 3 Encounters:  07/02/21 263 lb 9.6 oz (119.6 kg)  08/14/20 250 lb 6.4 oz (113.6 kg)  05/05/20 246 lb 3.2 oz (111.7 kg)     Health Maintenance Due  Topic Date Due   COLON CANCER SCREENING ANNUAL FOBT  Never done   Zoster Vaccines- Shingrix (1 of 2) Never done    There are no preventive care reminders to display for this patient.  Lab Results  Component Value Date   TSH 2.678 06/22/2015   Lab Results  Component Value Date   WBC 8.1 08/14/2020   HGB 16.2 08/14/2020   HCT 49.2 08/14/2020   MCV 90 08/14/2020   PLT 244 08/14/2020   Lab Results  Component Value Date   NA 136 06/28/2021   K 3.8 06/28/2021   CO2 27 06/28/2021   GLUCOSE 76 06/28/2021   BUN 11 06/28/2021   CREATININE 1.00 06/28/2021   BILITOT 0.3 08/14/2020   ALKPHOS 90 08/14/2020   AST 18 08/14/2020   ALT 21 08/14/2020   PROT 7.5 08/14/2020   ALBUMIN 4.2 08/14/2020   CALCIUM 8.8 (L) 06/28/2021   ANIONGAP 9 06/28/2021  Lab Results  Component Value Date   CHOL 185 08/14/2020   Lab Results  Component Value Date   HDL 46 08/14/2020   Lab Results  Component Value Date   LDLCALC 119 (H) 08/14/2020   Lab Results  Component Value Date   TRIG 112 08/14/2020   Lab Results  Component Value Date   CHOLHDL 4.0 08/14/2020   Lab Results  Component Value Date   HGBA1C 6.4 (H) 08/14/2020      Assessment & Plan:   Problem List Items Addressed This Visit        Cardiovascular and Mediastinum   Essential hypertension - Primary (Chronic)    Not well controlled as patient's not currently taking the medications prescribed I went over his medicines in detail and he produced the organized medication kit sent to him by his insurance company and has the correct dose of bisoprolol and amlodipine I told him to resume this medication      Relevant Medications   sildenafil (VIAGRA) 100 MG tablet   Chronic atrial fibrillation ([facility])    History of chronic atrial fibrillation we will continue Eliquis      Relevant Medications   sildenafil (VIAGRA) 100 MG tablet     Other   Other hyperlipidemia (Chronic)    Recommended patient stay on atorvastatin      Relevant Medications   sildenafil (VIAGRA) 100 MG tablet   Tobacco use       Current smoking consumption amount: Patient relapsed into smoking 5 cigarettes daily  Dicsussion on advise to quit smoking and smoking impacts: Cardiovascular impacts and lung impacts  Patient's willingness to quit: Focused on quitting again  Methods to quit smoking discussed: Nicotine replacement behavioral modification  Medication management of smoking session drugs discussed: Nicotine replacement  Resources provided:  AVS   Setting quit date not established  Follow-up arranged 6 weeks   Time spent counseling the patient: 3 minutes       Pre-diabetes    A1c was 6.4 at the last visit in February we will repeat at this visit      Relevant Orders   Hemoglobin N1Z   Basic Metabolic Panel   Erectile dysfunction    Refills on Viagra given      RESOLVED: Dysuria    Dysuria has resolved and flank pain resolving on Bactrim      Other Visit Diagnoses     Colon cancer screening       Relevant Orders   Fecal occult blood, imunochemical       Meds ordered this encounter  Medications   DISCONTD: albuterol (VENTOLIN HFA) 108 (90 Base) MCG/ACT inhaler    Sig: Inhale 1-2 puffs into the lungs every 6 (six)  hours as needed for wheezing.    Dispense:  1 each    Refill:  0   sildenafil (VIAGRA) 100 MG tablet    Sig: Take 1 tablet by mouth every day as needed for erectile dysfunction    Dispense:  10 tablet    Refill:  11   albuterol (VENTOLIN HFA) 108 (90 Base) MCG/ACT inhaler    Sig: Inhale 1-2 puffs into the lungs every 6 (six) hours as needed for wheezing.    Dispense:  1 each    Refill:  0   Patient will receive fecal occult kit for colon cancer screening Follow-up: Return in about 6 weeks (around 08/13/2021).    Asencion Noble, MD

## 2021-07-02 ENCOUNTER — Encounter: Payer: Self-pay | Admitting: Critical Care Medicine

## 2021-07-02 ENCOUNTER — Ambulatory Visit: Payer: Commercial Managed Care - HMO | Attending: Critical Care Medicine | Admitting: Critical Care Medicine

## 2021-07-02 ENCOUNTER — Other Ambulatory Visit: Payer: Self-pay

## 2021-07-02 VITALS — BP 148/83 | HR 90 | Resp 16 | Wt 263.6 lb

## 2021-07-02 DIAGNOSIS — I10 Essential (primary) hypertension: Secondary | ICD-10-CM

## 2021-07-02 DIAGNOSIS — Z720 Tobacco use: Secondary | ICD-10-CM

## 2021-07-02 DIAGNOSIS — R300 Dysuria: Secondary | ICD-10-CM | POA: Diagnosis not present

## 2021-07-02 DIAGNOSIS — Z1211 Encounter for screening for malignant neoplasm of colon: Secondary | ICD-10-CM

## 2021-07-02 DIAGNOSIS — R7303 Prediabetes: Secondary | ICD-10-CM

## 2021-07-02 DIAGNOSIS — I482 Chronic atrial fibrillation, unspecified: Secondary | ICD-10-CM

## 2021-07-02 DIAGNOSIS — E7849 Other hyperlipidemia: Secondary | ICD-10-CM

## 2021-07-02 DIAGNOSIS — N529 Male erectile dysfunction, unspecified: Secondary | ICD-10-CM

## 2021-07-02 MED ORDER — ALBUTEROL SULFATE HFA 108 (90 BASE) MCG/ACT IN AERS: 1.0000 | INHALATION_SPRAY | Freq: Four times a day (QID) | RESPIRATORY_TRACT | 0 refills | Status: DC | PRN

## 2021-07-02 MED ORDER — SILDENAFIL CITRATE 100 MG PO TABS: ORAL_TABLET | ORAL | 11 refills | Status: DC

## 2021-07-02 NOTE — Assessment & Plan Note (Signed)
Recommended patient stay on atorvastatin

## 2021-07-02 NOTE — Assessment & Plan Note (Signed)
History of chronic atrial fibrillation we will continue Eliquis

## 2021-07-02 NOTE — Assessment & Plan Note (Signed)
° ° °•   Current smoking consumption amount: Patient relapsed into smoking 5 cigarettes daily   Dicsussion on advise to quit smoking and smoking impacts: Cardiovascular impacts and lung impacts   Patient's willingness to quit: Focused on quitting again   Methods to quit smoking discussed: Nicotine replacement behavioral modification   Medication management of smoking session drugs discussed: Nicotine replacement   Resources provided:  AVS    Setting quit date not established   Follow-up arranged 6 weeks   Time spent counseling the patient: 3 minutes

## 2021-07-02 NOTE — Assessment & Plan Note (Signed)
Dysuria has resolved and flank pain resolving on Bactrim

## 2021-07-02 NOTE — Assessment & Plan Note (Signed)
Not well controlled as patient's not currently taking the medications prescribed I went over his medicines in detail and he produced the organized medication kit sent to him by his insurance company and has the correct dose of bisoprolol and amlodipine I told him to resume this medication °

## 2021-07-02 NOTE — Progress Notes (Signed)
Pt did states that he had an kidney infection, he went to urgent care Thursday

## 2021-07-02 NOTE — Patient Instructions (Signed)
Finish your current antibiotic for your bladder infection  Stop by the lab for lab draw for diabetes screening metabolic panel and pick up a colon cancer screening kit  Take your current medications you are receiving in the mail as prescribed these are correct  Focus on smoking cessation as we discussed see attachment  Return to see Dr. Joya Gaskins 6 weeks  Prescription for albuterol and Viagra were sent to your [facility] pharmacy

## 2021-07-02 NOTE — Assessment & Plan Note (Signed)
A1c was 6.4 at the last visit in February we will repeat at this visit

## 2021-07-02 NOTE — Assessment & Plan Note (Signed)
Refills on Viagra given

## 2021-07-03 ENCOUNTER — Telehealth: Payer: Self-pay | Admitting: *Deleted

## 2021-07-03 ENCOUNTER — Other Ambulatory Visit: Payer: Self-pay | Admitting: Critical Care Medicine

## 2021-07-03 ENCOUNTER — Telehealth: Payer: Self-pay

## 2021-07-03 DIAGNOSIS — E1165 Type 2 diabetes mellitus with hyperglycemia: Secondary | ICD-10-CM

## 2021-07-03 LAB — HEMOGLOBIN A1C
Est. average glucose Bld gHb Est-mCnc: 148 mg/dL
Hgb A1c MFr Bld: 6.8 % — ABNORMAL HIGH (ref 4.8–5.6)

## 2021-07-03 LAB — BASIC METABOLIC PANEL
BUN/Creatinine Ratio: 9 (ref 9–20)
BUN: 11 mg/dL (ref 6–24)
CO2: 24 mmol/L (ref 20–29)
Calcium: 9.3 mg/dL (ref 8.7–10.2)
Chloride: 102 mmol/L (ref 96–106)
Creatinine, Ser: 1.24 mg/dL (ref 0.76–1.27)
Glucose: 139 mg/dL — ABNORMAL HIGH (ref 70–99)
Potassium: 4.6 mmol/L (ref 3.5–5.2)
Sodium: 141 mmol/L (ref 134–144)
eGFR: 68 mL/min/{1.73_m2} (ref >59)

## 2021-07-03 MED ORDER — METFORMIN HCL 500 MG PO TABS: 500.0000 mg | ORAL_TABLET | Freq: Two times a day (BID) | ORAL | 3 refills | Status: DC

## 2021-07-03 NOTE — Telephone Encounter (Signed)
Pt was called and vm was left, Information has been sent to nurse pool and letter will be sent as well

## 2021-07-03 NOTE — Telephone Encounter (Signed)
Copied from CRM [id number]. 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   Lethea Killings is her name, note needs to state that she will return to work tomorrow and that she needed to provide transportation for the patient today.  Best contact: [phone]

## 2021-07-03 NOTE — Telephone Encounter (Signed)
-----   Message from Elsie Stain, MD sent at 07/03/2021  5:58 AM EST ----- Let pt know Hga1C has increased to 6.8   I recommend he start one metformin pill daily this was [facility] to his [facility]     he can have the home delivery pharmacy pick up on this and add to his delivery box in future

## 2021-07-04 ENCOUNTER — Encounter: Payer: Medicare Other | Admitting: Critical Care Medicine

## 2021-07-04 ENCOUNTER — Encounter: Payer: Self-pay | Admitting: Critical Care Medicine

## 2021-07-04 NOTE — Telephone Encounter (Signed)
Call placed to number provided and VM was left informing patient that letter is ready for pick up.

## 2021-07-04 NOTE — Telephone Encounter (Signed)
Letter was produced and given to [facility]

## 2021-07-27 ENCOUNTER — Other Ambulatory Visit: Payer: Self-pay | Admitting: Critical Care Medicine

## 2021-07-27 DIAGNOSIS — E7849 Other hyperlipidemia: Secondary | ICD-10-CM

## 2021-07-27 NOTE — Telephone Encounter (Signed)
Requested Prescriptions  Pending Prescriptions Disp Refills   atorvastatin (LIPITOR) 10 MG tablet [Pharmacy Med Name: Atorvastatin Calcium 10mg  Tablet] 30 tablet 11    Sig: Take 1 tablet by mouth every day     Cardiovascular:  Antilipid - Statins Failed - 07/27/2021  4:39 PM      Failed - Lipid Panel in normal range within the last 12 months    Cholesterol, Total  Date Value Ref Range Status  08/14/2020 185 100 - 199 mg/dL Final   LDL Chol Calc (NIH)  Date Value Ref Range Status  08/14/2020 119 (H) 0 - 99 mg/dL Final   HDL  Date Value Ref Range Status  08/14/2020 46 >39 mg/dL Final   Triglycerides  Date Value Ref Range Status  08/14/2020 112 0 - 149 mg/dL Final         Passed - Patient is not pregnant      Passed - Valid encounter within last 12 months    Recent Outpatient Visits          3 weeks ago Essential hypertension   [place], [place]   11 months ago [place] Elsie Stain, MD   1 year ago Chronic pain of right ankle   [place], Deborah B, MD   1 year ago Essential hypertension   [place], Patrick E, MD   2 years ago Hordeolum externum left upper eyelid   [place], MD      Future Appointments            In 2 weeks Elsie Stain, MD [place]

## 2021-08-02 ENCOUNTER — Other Ambulatory Visit: Payer: Self-pay | Admitting: Critical Care Medicine

## 2021-08-02 NOTE — Telephone Encounter (Signed)
Has newer rx, this is from 2001. Requested Prescriptions  Pending Prescriptions Disp Refills   sildenafil (VIAGRA) 100 MG tablet [Pharmacy Med Name: Sildenafil Citrate 100mg  Tablet] 10 tablet 11    Sig: Take 1 tablet by mouth every day as needed for erectile dysfunction     Urology: Erectile Dysfunction Agents Failed - 08/02/2021  1:01 AM      Failed - Last BP in normal range    BP Readings from Last 1 Encounters:  07/02/21 (!) 148/83         Passed - AST in normal range and within 360 days    AST  Date Value Ref Range Status  08/14/2020 18 0 - 40 IU/L Final         Passed - ALT in normal range and within 360 days    ALT  Date Value Ref Range Status  08/14/2020 21 0 - 44 IU/L Final         Passed - Valid encounter within last 12 months    Recent Outpatient Visits          1 month ago Essential hypertension   [facility] 08/16/2020, MD   11 months ago Dysuria   [facility], MD   1 year ago Chronic pain of right ankle   [facility], MD   1 year ago Essential hypertension   [facility] Marcine Matar, MD   2 years ago Hordeolum externum left upper eyelid   [facility] Storm Frisk, MD      Future Appointments            In 1 week Storm Frisk, MD [facility]

## 2021-08-12 NOTE — Progress Notes (Incomplete)
Established Patient Office Visit  Subjective:  Patient ID: Brian Moody., male    DOB: [birth year]  Age: 56 y.o. MRN: [id number]  CC: No chief complaint on file.   HPI Brian Moody. presents for  FIT, eye, foot , alb,  07/02/21 Brian Moody. presents for follow-up of hypertension.  Patient became confused as to whether he should be taking his blood pressure medicines daily or not but tickly when his insurance company sent him a mail order supply of prepackaged medications.  He has not been on his bisoprolol or amlodipine for about 2 weeks and on arrival blood pressure is 148/83.  When he has been compliant with his medicines blood pressure has been well controlled.  He currently does not have any symptoms referable to the blood pressure.  He is also still smoking 5 cigarettes daily.  Patient was in the emergency room last week for acute onset of pyelonephritis with left-sided flank pain.  He is now taking a course of Bactrim and has seen improvement in symptoms.  He is trying to hydrate himself more at this time.  Patient did declined the flu vaccine at this visit  Patient is due colon cancer screening and was sent a Cologuard kit but did not process it properly.  We will have him do a fecal occult kit here in the office.   Essential hypertension - Primary (Chronic)       Not well controlled as patient's not currently taking the medications prescribed I went over his medicines in detail and he produced the organized medication kit sent to him by his insurance company and has the correct dose of bisoprolol and amlodipine I told him to resume this medication        Relevant Medications    sildenafil (VIAGRA) 100 MG tablet    Chronic atrial fibrillation (HCC)      History of chronic atrial fibrillation we will continue Eliquis        Relevant Medications    sildenafil (VIAGRA) 100 MG tablet        Other    Other hyperlipidemia (Chronic)      Recommended patient stay  on atorvastatin        Relevant Medications    sildenafil (VIAGRA) 100 MG tablet    Tobacco use   Past Medical History:  Diagnosis Date   Avulsion fracture of distal fibula 06/02/2013   Right distal fibula occurred 04/30/2013    Dysuria 08/14/2020   Hordeolum externum left upper eyelid 06/29/2019   Hypertension    Persistent atrial fibrillation ([place])    a. newly diagnosed on 05/2015 admission. started on coumadin.    Pre-diabetes    a. HgA1c 6.3 05/2015   Tobacco abuse     No past surgical history on file.  Family History  Problem Relation Age of Onset   Hypertension Father    Diabetes Maternal Grandmother    Hypertension Maternal Grandmother    Diabetes Maternal Grandfather    Hypertension Maternal Grandfather     Social History   Socioeconomic History   Marital status: Married    Spouse name: Not on file   Number of children: Not on file   Years of education: Not on file   Highest education level: Not on file  Occupational History   Not on file  Tobacco Use   Smoking status: Every Day    Packs/day: 0.10    Types: Cigarettes    Passive exposure: Never  Smokeless tobacco: Never  Vaping Use   Vaping Use: Never used  Substance and Sexual Activity   Alcohol use: No   Drug use: No   Sexual activity: Not Currently  Other Topics Concern   Not on file  Social History Narrative   Not on file   Social Determinants of Health   Financial Resource Strain: Not on file  Food Insecurity: Not on file  Transportation Needs: Not on file  Physical Activity: Not on file  Stress: Not on file  Social Connections: Not on file  Intimate Partner Violence: Not on file    Outpatient Medications Prior to Visit  Medication Sig Dispense Refill   acetaminophen (TYLENOL) 500 MG tablet Take 2 tablets (1,000 mg total) by mouth every 6 (six) hours as needed for headache. 60 tablet 2   albuterol (VENTOLIN HFA) 108 (90 Base) MCG/ACT inhaler Inhale 1-2  puffs into the lungs every 6 (six) hours as needed for wheezing. 1 each 0   amLODipine (NORVASC) 10 MG tablet Take 1 tablet (10 mg total) by mouth daily for 5 days. 5 tablet 0   apixaban (ELIQUIS) 5 MG TABS tablet Take 1 tablet (5 mg total) by mouth 2 (two) times daily. Must have office visit for refills 60 tablet 0   atorvastatin (LIPITOR) 10 MG tablet Take 1 tablet by mouth every day 90 tablet 1   bisoprolol (ZEBETA) 10 MG tablet Take 1 tablet by mouth every day 30 tablet 0   blood glucose meter kit and supplies KIT Dispense based on patient and insurance preference. Use up to four times daily as directed. (FOR ICD-9 250.00, 250.01). 1 each 0   Blood Pressure Monitoring (5 SERIES BP MONITOR) DEVI 1 Units by Does not apply route daily. Measure blood pressure daily 1 each 0   Cholecalciferol (DIALYVITE VITAMIN D 5000 PO) Take 1 capsule by mouth daily.     metFORMIN (GLUCOPHAGE) 500 MG tablet Take 1 tablet (500 mg total) by mouth 2 (two) times daily with a meal. 180 tablet 3   sildenafil (VIAGRA) 100 MG tablet Take 1 tablet by mouth every day as needed for erectile dysfunction 10 tablet 11   No facility-administered medications prior to visit.    No Known Allergies  ROS Review of Systems    Objective:    Physical Exam  There were no vitals taken for this visit. Wt Readings from Last 3 Encounters:  07/02/21 263 lb 9.6 oz (119.6 kg)  08/14/20 250 lb 6.4 oz (113.6 kg)  05/05/20 246 lb 3.2 oz (111.7 kg)     Health Maintenance Due  Topic Date Due   FOOT EXAM  Never done   OPHTHALMOLOGY EXAM  Never done   URINE MICROALBUMIN  Never done   COLON CANCER SCREENING ANNUAL FOBT  Never done   Zoster Vaccines- Shingrix (1 of 2) Never done    There are no preventive care reminders to display for this patient.  Lab Results  Component Value Date   TSH 2.678 06/22/2015   Lab Results  Component Value Date   WBC 8.1 08/14/2020   HGB 16.2 08/14/2020   HCT 49.2 08/14/2020    MCV 90 08/14/2020   PLT 244 08/14/2020   Lab Results  Component Value Date   NA 141 07/02/2021   K 4.6 07/02/2021   CO2 24 07/02/2021   GLUCOSE 139 (H) 07/02/2021   BUN 11 07/02/2021   CREATININE 1.24 07/02/2021   BILITOT 0.3 08/14/2020   ALKPHOS 90 08/14/2020  AST 18 08/14/2020   ALT 21 08/14/2020   PROT 7.5 08/14/2020   ALBUMIN 4.2 08/14/2020   CALCIUM 9.3 07/02/2021   ANIONGAP 9 06/28/2021   EGFR 68 07/02/2021   Lab Results  Component Value Date   CHOL 185 08/14/2020   Lab Results  Component Value Date   HDL 46 08/14/2020   Lab Results  Component Value Date   LDLCALC 119 (H) 08/14/2020   Lab Results  Component Value Date   TRIG 112 08/14/2020   Lab Results  Component Value Date   CHOLHDL 4.0 08/14/2020   Lab Results  Component Value Date   HGBA1C 6.8 (H) 07/02/2021      Assessment & Plan:   Problem List Items Addressed This Visit   None   No orders of the defined types were placed in this encounter.   Follow-up: No follow-ups on file.    Asencion Noble, MD

## 2021-08-13 ENCOUNTER — Ambulatory Visit: Payer: Commercial Managed Care - HMO | Admitting: Critical Care Medicine

## 2021-08-15 ENCOUNTER — Other Ambulatory Visit: Payer: Self-pay | Admitting: Critical Care Medicine

## 2021-08-15 DIAGNOSIS — I482 Chronic atrial fibrillation, unspecified: Secondary | ICD-10-CM

## 2021-08-15 NOTE — Telephone Encounter (Signed)
Requested medication (s) are due for refill today: Yes  Requested medication (s) are on the active medication list: Yes  Last refill:  06/15/21  Future visit scheduled: No  Notes to clinic:  Protocol indicates pt. Needs lab work.    Requested Prescriptions  Pending Prescriptions Disp Refills   ELIQUIS 5 MG TABS tablet [Pharmacy Med Name: Eliquis 5mg  Tablet] 60 tablet 11    Sig: Take 1 tablet by mouth twice daily. Appt needed     Hematology:  Anticoagulants - apixaban Failed - 08/15/2021  1:03 AM      Failed - PLT in normal range and within 360 days    Platelets  Date Value Ref Range Status  08/14/2020 244 150 - 450 x10E3/uL Final          Failed - HGB in normal range and within 360 days    Hemoglobin  Date Value Ref Range Status  08/14/2020 16.2 13.0 - 17.7 g/dL Final          Failed - HCT in normal range and within 360 days    Hematocrit  Date Value Ref Range Status  08/14/2020 49.2 37.5 - 51.0 % Final          Failed - AST in normal range and within 360 days    AST  Date Value Ref Range Status  08/14/2020 18 0 - 40 IU/L Final          Failed - ALT in normal range and within 360 days    ALT  Date Value Ref Range Status  08/14/2020 21 0 - 44 IU/L Final          Passed - Cr in normal range and within 360 days    Creat  Date Value Ref Range Status  12/04/2015 1.15 0.70 - 1.33 mg/dL Final    Comment:      For patients > or = 57 years of age: The upper reference limit for Creatinine is approximately 13% higher for people identified as African-American.      Creatinine, Ser  Date Value Ref Range Status  07/02/2021 1.24 0.76 - 1.27 mg/dL Final          Passed - Valid encounter within last 12 months    Recent Outpatient Visits           1 month ago Essential hypertension   [place] Elsie Stain, MD   1 year ago [place] Elsie Stain, MD   1 year ago Chronic  pain of right ankle   [place] Ladell Pier, MD   1 year ago Essential hypertension   [place] Elsie Stain, MD   2 years ago Hordeolum externum left upper eyelid   [place] Elsie Stain, MD

## 2021-09-01 ENCOUNTER — Other Ambulatory Visit: Payer: Self-pay | Admitting: Critical Care Medicine

## 2021-09-03 NOTE — Telephone Encounter (Signed)
Requested medications are due for refill today.  Should not be ? ?Requested medications are on the active medications list.  yes ? ?Last refill. 07/02/2021 #10 11 refills ? ?Future visit scheduled.   no ? ?Notes to clinic.  Med was refilled 07/02/2021 #10 with 11 refills. Also labs are expired. ? ? ? ?Requested Prescriptions  ?Pending Prescriptions Disp Refills  ? sildenafil (VIAGRA) 100 MG tablet [Pharmacy Med Name: Sildenafil Citrate 100mg  Tablet] 10 tablet 11  ?  Sig: Take 1 tablet by mouth every day as needed for erectile dysfunction  ?  ? Urology: Erectile Dysfunction Agents Failed - 09/01/2021 12:01 AM  ?  ?  Failed - AST in normal range and within 360 days  ?  AST  ?Date Value Ref Range Status  ?08/14/2020 18 0 - 40 IU/L Final  ?  ?  ?  ?  Failed - ALT in normal range and within 360 days  ?  ALT  ?Date Value Ref Range Status  ?08/14/2020 21 0 - 44 IU/L Final  ?  ?  ?  ?  Failed - Last BP in normal range  ?  BP Readings from Last 1 Encounters:  ?07/02/21 (!) 148/83  ?  ?  ?  ?  Passed - Valid encounter within last 12 months  ?  Recent Outpatient Visits   ? ?      ? 2 months ago Essential hypertension  ? [facility], MD  ? 1 year ago Dysuria  ? [facility], MD  ? 1 year ago Chronic pain of right ankle  ? [facility], MD  ? 1 year ago Essential hypertension  ? [facility], MD  ? 2 years ago Hordeolum externum left upper eyelid  ? [facility], MD  ? ?  ?  ? ?  ?  ?  ?  ?

## 2021-12-03 ENCOUNTER — Encounter (HOSPITAL_COMMUNITY): Payer: Self-pay

## 2021-12-03 ENCOUNTER — Ambulatory Visit (HOSPITAL_COMMUNITY)
Admission: EM | Admit: 2021-12-03 | Discharge: 2021-12-03 | Disposition: A | Discharge status: Home / Self Care | Payer: Medicare Other | Attending: Emergency Medicine | Admitting: Emergency Medicine

## 2021-12-03 ENCOUNTER — Telehealth (HOSPITAL_COMMUNITY): Payer: Self-pay | Admitting: Emergency Medicine

## 2021-12-03 DIAGNOSIS — R609 Edema, unspecified: Secondary | ICD-10-CM

## 2021-12-03 DIAGNOSIS — I10 Essential (primary) hypertension: Secondary | ICD-10-CM | POA: Diagnosis not present

## 2021-12-03 MED ORDER — AMLODIPINE BESYLATE 10 MG PO TABS: 5.0000 mg | ORAL_TABLET | Freq: Every day | ORAL | 0 refills | Status: DC

## 2021-12-03 MED ORDER — AMLODIPINE BESYLATE 10 MG PO TABS
5.0000 mg | ORAL_TABLET | Freq: Every day | ORAL | 0 refills | Status: DC
  Filled 2021-12-03: qty 30, 60d supply, fill #0

## 2021-12-03 NOTE — ED Provider Notes (Signed)
[facility]    CSN: [id number] Arrival date & time: 12/03/21  1500     History   Chief Complaint Chief Complaint  Patient presents with   Joint Swelling    HPI Brian A Dawood Jr. is a 56 y.o. male.  Presents with bilateral ankle swelling for the past week.  He notices swelling increases when he stands too long at work.  He does have some pain in the ankles.  He has a history of high blood pressure for which he used to be prescribed beta-blocker and amlodipine.  He had these medicines delivered to his home but has not received a delivery in the past 2 to 3 months.  He does have a primary care provider and has an appointment with them for July. Does not wear compression socks.  Blood pressure is elevated in clinic today.  He denies headache, vision changes, dizziness, blurry vision, chest pain, chest tightness, shortness of breath, abdominal pain, weakness.  Past Medical History:  Diagnosis Date   Avulsion fracture of distal fibula 06/02/2013   Right distal fibula occurred 04/30/2013    Dysuria 08/14/2020   Hordeolum externum left upper eyelid 06/29/2019   Hypertension    Persistent atrial fibrillation (HCC)    a. newly diagnosed on 05/2015 admission. started on coumadin.    Pre-diabetes    a. HgA1c 6.3 05/2015   Tobacco abuse     Patient Active Problem List   Diagnosis Date Noted   Controlled type 2 diabetes mellitus with hyperglycemia, without long-term current use of insulin (HCC) 07/03/2021   Chronic pain of right ankle 08/14/2020   Erectile dysfunction 05/11/2019   Chronic atrial fibrillation (HCC) 09/03/2018   Midline low back pain without sciatica 03/29/2016   Obesity (BMI 30-39.9) 03/29/2016   Bilateral foot pain 07/20/2015   Essential hypertension    Tobacco use    Pre-diabetes    Vitamin D insufficiency 08/24/2013   Other hyperlipidemia 08/24/2013    History reviewed. No pertinent surgical history.     Home Medications    Prior to Admission  medications   Medication Sig Start Date End Date Taking? Authorizing Provider  acetaminophen (TYLENOL) 500 MG tablet Take 2 tablets (1,000 mg total) by mouth every 6 (six) hours as needed for headache. 05/05/20   Johnson, Deborah B, MD  albuterol (VENTOLIN HFA) 108 (90 Base) MCG/ACT inhaler Inhale 1-2 puffs into the lungs every 6 (six) hours as needed for wheezing. 07/02/21   Wright, Patrick E, MD  amLODipine (NORVASC) 10 MG tablet Take 0.5 tablets (5 mg total) by mouth daily. 12/03/21   Rising, Rebecca, PA-C  atorvastatin (LIPITOR) 10 MG tablet Take 1 tablet by mouth every day 07/27/21   Wright, Patrick E, MD  bisoprolol (ZEBETA) 10 MG tablet Take 1 tablet by mouth every day 03/12/21   Wright, Patrick E, MD  blood glucose meter kit and supplies KIT Dispense based on patient and insurance preference. Use up to four times daily as directed. (FOR ICD-9 250.00, 250.01). 06/29/19   Wright, Patrick E, MD  Blood Pressure Monitoring (5 SERIES BP MONITOR) DEVI 1 Units by Does not apply route daily. Measure blood pressure daily 06/29/19   Wright, Patrick E, MD  Cholecalciferol (DIALYVITE VITAMIN D 5000 PO) Take 1 capsule by mouth daily.    [provider]  ELIQUIS 5 MG TABS tablet Take 1 tablet by mouth twice daily. Appt needed 08/15/21   Wright, Patrick E, MD  metFORMIN (GLUCOPHAGE) 500 MG tablet Take 1   tablet (500 mg total) by mouth 2 (two) times daily with a meal. 07/03/21   Elsie Stain, MD  sildenafil (VIAGRA) 100 MG tablet Take 1 tablet by mouth every day as needed for erectile dysfunction 09/05/21   Elsie Stain, MD  carvedilol (COREG) 12.5 MG tablet Take 1 tablet (12.5 mg total) by mouth 2 (two) times daily with a meal. 03/02/20 08/14/20  Elsie Stain, MD    Family History Family History  Problem Relation Age of Onset   Hypertension Father    Diabetes Maternal Grandmother    Hypertension Maternal Grandmother    Diabetes Maternal Grandfather    Hypertension Maternal Grandfather      Social History Social History   Tobacco Use   Smoking status: Every Day    Packs/day: 0.10    Types: Cigarettes    Passive exposure: Never   Smokeless tobacco: Never  Vaping Use   Vaping Use: Never used  Substance Use Topics   Alcohol use: No   Drug use: No     Allergies   Patient has no known allergies.   Review of Systems Review of Systems  Per HPI  Physical Exam Triage Vital Signs ED Triage Vitals  Enc Vitals Group     BP 12/03/21 1701 (!) 176/113     Pulse Rate 12/03/21 1701 99     Resp 12/03/21 1701 18     Temp 12/03/21 1701 98.2 F (36.8 C)     Temp Source 12/03/21 1701 Oral     SpO2 12/03/21 1701 98 %     Weight --      Height --      Head Circumference --      Peak Flow --      Pain Score 12/03/21 1700 10     Pain Loc --      Pain Edu? --      Excl. in [place]? --    No data found.  Updated Vital Signs BP (!) 150/110 (BP Location: Left Arm)   Pulse 95   Temp 98.2 F (36.8 C) (Oral)   Resp 18   SpO2 97%     Physical Exam Vitals and nursing note reviewed.  Constitutional:      General: He is not in acute distress. HENT:     Head: Normocephalic and atraumatic.     Right Ear: Tympanic membrane and ear canal normal.     Left Ear: Tympanic membrane and ear canal normal.     Nose: Nose normal.     Mouth/Throat:     Mouth: Mucous membranes are moist.     Pharynx: Oropharynx is clear.  Eyes:     Extraocular Movements: Extraocular movements intact.     Conjunctiva/sclera: Conjunctivae normal.     Pupils: Pupils are equal, round, and reactive to light.  Cardiovascular:     Rate and Rhythm: Normal rate and regular rhythm.     Pulses: Normal pulses.     Heart sounds: Normal heart sounds.  Pulmonary:     Effort: Pulmonary effort is normal. No respiratory distress.     Breath sounds: Normal breath sounds.  Abdominal:     Palpations: Abdomen is soft.     Tenderness: There is no abdominal tenderness.  Musculoskeletal:        General: Normal  range of motion.     Cervical back: Normal range of motion.     Right lower leg: 1+ Edema present.     Left lower  leg: 1+ Edema present.     Comments: Not pitting  Neurological:     Mental Status: He is alert and oriented to person, place, and time.     Sensory: No sensory deficit.     Motor: No weakness.     Coordination: Coordination normal.     Gait: Gait normal.     Comments: Usually walks with cane but did not bring today    UC Treatments / Results  Labs (all labs ordered are listed, but only abnormal results are displayed) Labs Reviewed - No data to display  EKG  Radiology No results found.  Procedures Procedures (including critical care time)  Medications Ordered in UC Medications - No data to display  Initial Impression / Assessment and Plan / UC Course  I have reviewed the triage vital signs and the nursing notes.  Pertinent labs & imaging results that were available during my care of the patient were reviewed by me and considered in my medical decision making (see chart for details).  Blood pressure came down with recheck.  Patient is otherwise asymptomatic.  I recommend that he try half dose of amlodipine for now.  He will try 5 mg daily until he can see his primary care provider.  He has appointment with them in July but he will call tomorrow morning to try and move his appointment up.  Discussed return precautions. Patient agrees to plan and is discharged in stable condition.  Final Clinical Impressions(s) / UC Diagnoses   Final diagnoses:  Peripheral edema  Hypertension, unspecified type     Discharge Instructions      Please start the amlodipine 5 mg once daily. I recommend you schedule with your primary care provider as soon as possible.  If you develop any symptoms (chest pain or tightness, shortness of breath, headache, dizziness, vision changes) please go to the emergency department.    ED Prescriptions     Medication Sig Dispense Auth.  Provider   amLODipine (NORVASC) 10 MG tablet Take 0.5 tablets (5 mg total) by mouth daily. 30 tablet Rising, Rebecca, PA-C      PDMP not reviewed this encounter.   Rising, Rebecca, PA-C 12/03/21 1757  

## 2021-12-03 NOTE — ED Triage Notes (Addendum)
Pt states swelling on both of his ankle. Pt states his ankles swell up from standing too long at work. Pt states its been ongoing for a 1 week. Pt states he does not take BP med

## 2021-12-03 NOTE — Discharge Instructions (Addendum)
Please start the amlodipine 5 mg once daily. I recommend you schedule with your primary care provider as soon as possible.  If you develop any symptoms (chest pain or tightness, shortness of breath, headache, dizziness, vision changes) please go to the emergency department.

## 2021-12-04 ENCOUNTER — Other Ambulatory Visit: Payer: Self-pay

## 2021-12-06 ENCOUNTER — Other Ambulatory Visit: Payer: Self-pay | Admitting: Critical Care Medicine

## 2021-12-06 DIAGNOSIS — E7849 Other hyperlipidemia: Secondary | ICD-10-CM

## 2021-12-06 NOTE — Telephone Encounter (Signed)
Requested Prescriptions  Pending Prescriptions Disp Refills  . atorvastatin (LIPITOR) 10 MG tablet [Pharmacy Med Name: Atorvastatin Calcium 10mg  Tablet] 30 tablet 11    Sig: Take 1 tablet by mouth every day     Cardiovascular:  Antilipid - Statins Failed - 12/06/2021  2:02 AM      Failed - Lipid Panel in normal range within the last 12 months    Cholesterol, Total  Date Value Ref Range Status  08/14/2020 185 100 - 199 mg/dL Final   LDL Chol Calc (NIH)  Date Value Ref Range Status  08/14/2020 119 (H) 0 - 99 mg/dL Final   HDL  Date Value Ref Range Status  08/14/2020 46 >39 mg/dL Final   Triglycerides  Date Value Ref Range Status  08/14/2020 112 0 - 149 mg/dL Final         Passed - Patient is not pregnant      Passed - Valid encounter within last 12 months    Recent Outpatient Visits          5 months ago Essential hypertension   [facility] 08/16/2020, MD   1 year ago Dysuria   [facility] Storm Frisk, MD   1 year ago Chronic pain of right ankle   [facility], MD   1 year ago Essential hypertension   [facility] Marcine Matar, MD   2 years ago Hordeolum externum left upper eyelid   [facility] Storm Frisk, MD

## 2022-01-09 ENCOUNTER — Encounter: Payer: Self-pay | Admitting: Physician Assistant

## 2022-01-09 ENCOUNTER — Ambulatory Visit (INDEPENDENT_AMBULATORY_CARE_PROVIDER_SITE_OTHER): Payer: Medicare Other | Admitting: Physician Assistant

## 2022-01-09 VITALS — BP 119/74 | HR 99 | Temp 98.2°F | Resp 16 | Wt 254.0 lb

## 2022-01-09 DIAGNOSIS — I482 Chronic atrial fibrillation, unspecified: Secondary | ICD-10-CM

## 2022-01-09 DIAGNOSIS — I10 Essential (primary) hypertension: Secondary | ICD-10-CM | POA: Diagnosis not present

## 2022-01-09 DIAGNOSIS — E559 Vitamin D deficiency, unspecified: Secondary | ICD-10-CM

## 2022-01-09 DIAGNOSIS — E7849 Other hyperlipidemia: Secondary | ICD-10-CM | POA: Diagnosis not present

## 2022-01-09 DIAGNOSIS — M545 Low back pain, unspecified: Secondary | ICD-10-CM | POA: Diagnosis not present

## 2022-01-09 DIAGNOSIS — G8929 Other chronic pain: Secondary | ICD-10-CM

## 2022-01-09 DIAGNOSIS — E1165 Type 2 diabetes mellitus with hyperglycemia: Secondary | ICD-10-CM | POA: Diagnosis not present

## 2022-01-09 MED ORDER — CYCLOBENZAPRINE HCL 5 MG PO TABS: 5.0000 mg | ORAL_TABLET | Freq: Three times a day (TID) | ORAL | 0 refills | Status: DC | PRN

## 2022-01-09 MED ORDER — BISOPROLOL FUMARATE 10 MG PO TABS: ORAL_TABLET | ORAL | 2 refills | Status: DC

## 2022-01-09 MED ORDER — AMLODIPINE BESYLATE 10 MG PO TABS: 5.0000 mg | ORAL_TABLET | Freq: Every day | ORAL | 1 refills | Status: DC

## 2022-01-09 MED ORDER — METFORMIN HCL 500 MG PO TABS: 500.0000 mg | ORAL_TABLET | Freq: Every day | ORAL | 1 refills | Status: DC

## 2022-01-09 NOTE — Progress Notes (Signed)
Medication refill on metformin, amlodipine, and bisoprolol

## 2022-01-09 NOTE — Patient Instructions (Signed)
I have refilled your medications for you.  I strongly encourage you to make sure that you never run out of these medications, please feel free to be seen at the mobile unit if you are unable to see Dr. Delford Field.  To help with your back pain, I strongly encourage you to use gentle stretching, ice, and you can use the muscle relaxer every 8 hours as needed.  We will call you with today's lab results  Roney Jaffe, PA-C Physician Assistant [facility] [email or web address]   Acute Back Pain, Adult Acute back pain is sudden and usually short-lived. It is often caused by an injury to the muscles and tissues in the back. The injury may result from: A muscle, tendon, or ligament getting overstretched or torn. Ligaments are tissues that connect bones to each other. Lifting something improperly can cause a back strain. Wear and tear (degeneration) of the spinal disks. Spinal disks are circular tissue that provide cushioning between the bones of the spine (vertebrae). Twisting motions, such as while playing sports or doing yard work. A hit to the back. Arthritis. You may have a physical exam, lab tests, and imaging tests to find the cause of your pain. Acute back pain usually goes away with rest and home care. Follow these instructions at home: Managing pain, stiffness, and swelling Take over-the-counter and prescription medicines only as told by your health care provider. Treatment may include medicines for pain and inflammation that are taken by mouth or applied to the skin, or muscle relaxants. Your health care provider may recommend applying ice during the first 24-48 hours after your pain starts. To do this: Put ice in a plastic bag. Place a towel between your skin and the bag. Leave the ice on for 20 minutes, 2-3 times a day. Remove the ice if your skin turns bright red. This is very important. If you cannot feel pain, heat, or cold, you  have a greater risk of damage to the area. If directed, apply heat to the affected area as often as told by your health care provider. Use the heat source that your health care provider recommends, such as a moist heat pack or a heating pad. Place a towel between your skin and the heat source. Leave the heat on for 20-30 minutes. Remove the heat if your skin turns bright red. This is especially important if you are unable to feel pain, heat, or cold. You have a greater risk of getting burned. Activity  Do not stay in bed. Staying in bed for more than 1-2 days can delay your recovery. Sit up and stand up straight. Avoid leaning forward when you sit or hunching over when you stand. If you work at a desk, sit close to it so you do not need to lean over. Keep your chin tucked in. Keep your neck drawn back, and keep your elbows bent at a 90-degree angle (right angle). Sit high and close to the steering wheel when you drive. Add lower back (lumbar) support to your car seat, if needed. Take short walks on even surfaces as soon as you are able. Try to increase the length of time you walk each day. Do not sit, drive, or stand in one place for more than 30 minutes at a time. Sitting or standing for long periods of time can put stress on your back. Do not drive or use heavy machinery while taking prescription pain medicine. Use proper lifting techniques. When you bend and lift, use  positions that put less stress on your back: [place] your knees. Keep the load close to your body. Avoid twisting. Exercise regularly as told by your health care provider. Exercising helps your back heal faster and helps prevent back injuries by keeping muscles strong and flexible. Work with a physical therapist to make a safe exercise program, as recommended by your health care provider. Do any exercises as told by your physical therapist. Lifestyle Maintain a healthy weight. Extra weight puts stress on your back and makes it  difficult to have good posture. Avoid activities or situations that make you feel anxious or stressed. Stress and anxiety increase muscle tension and can make back pain worse. Learn ways to manage anxiety and stress, such as through exercise. General instructions Sleep on a firm mattress in a comfortable position. Try lying on your side with your knees slightly bent. If you lie on your back, put a pillow under your knees. Keep your head and neck in a straight line with your spine (neutral position) when using electronic equipment like smartphones or pads. To do this: Raise your smartphone or pad to look at it instead of bending your head or neck to look down. Put the smartphone or pad at the level of your face while looking at the screen. Follow your treatment plan as told by your health care provider. This may include: Cognitive or behavioral therapy. Acupuncture or massage therapy. Meditation or yoga. Contact a health care provider if: You have pain that is not relieved with rest or medicine. You have increasing pain going down into your legs or buttocks. Your pain does not improve after 2 weeks. You have pain at night. You lose weight without trying. You have a fever or chills. You develop nausea or vomiting. You develop abdominal pain. Get help right away if: You develop new bowel or bladder control problems. You have unusual weakness or numbness in your arms or legs. You feel faint. These symptoms may represent a serious problem that is an emergency. Do not wait to see if the symptoms will go away. Get medical help right away. Call your local emergency services (911 in the [place] Do not drive yourself to the hospital. Summary Acute back pain is sudden and usually short-lived. Use proper lifting techniques. When you bend and lift, use positions that put less stress on your back. Take over-the-counter and prescription medicines only as told by your health care provider, and apply heat  or ice as told. This information is not intended to replace advice given to you by your health care provider. Make sure you discuss any questions you have with your health care provider. Document Revised: 09/01/2020 Document Reviewed: 09/01/2020 Elsevier Patient Education  2023 [employer].

## 2022-01-09 NOTE — Progress Notes (Signed)
Established Patient Office Visit  Subjective   Patient ID: Brian Moody., male    DOB: [birth year]  Age: 56 y.o. MRN: [id number]  Chief Complaint  Patient presents with   Medication Refill    States that he has been out of his bisoprolol for the past month.  States that he continues to get medications but his box of meds only has Eliquis and his cholesterol medication.  States that he has not been checking his blood pressure at home.  States that he did not start the [place], states that he did not receive the medication.  States that he has not been checking his blood glucose levels at home.  States that he has been having a low right back pain for the past month, states it is worse in the morning.  Describes pain as sharp and achy.  Denies injury or trauma but does endorse that he is unable to complete his task at work and did have to quit his job.  Denies numbness or tingling, denies radiation.    Past Medical History:  Diagnosis Date   Avulsion fracture of distal fibula 06/02/2013   Right distal fibula occurred 04/30/2013    Dysuria 08/14/2020   Hordeolum externum left upper eyelid 06/29/2019   Hypertension    Persistent atrial fibrillation (HCC)    a. newly diagnosed on 05/2015 admission. started on coumadin.    Pre-diabetes    a. HgA1c 6.3 05/2015   Tobacco abuse    Social History   Socioeconomic History   Marital status: Married    Spouse name: Not on file   Number of children: Not on file   Years of education: Not on file   Highest education level: Not on file  Occupational History   Not on file  Tobacco Use   Smoking status: Every Day    Packs/day: 0.10    Types: Cigarettes    Passive exposure: Never   Smokeless tobacco: Never  Vaping Use   Vaping Use: Never used  Substance and Sexual Activity   Alcohol use: No   Drug use: No   Sexual activity: Not Currently  Other Topics Concern   Not on file  Social History Narrative   Not on file   Social  Determinants of Health   Financial Resource Strain: Not on file  Food Insecurity: Not on file  Transportation Needs: Not on file  Physical Activity: Not on file  Stress: Not on file  Social Connections: Not on file  Intimate Partner Violence: Not on file   Family History  Problem Relation Age of Onset   Hypertension Father    Diabetes Maternal Grandmother    Hypertension Maternal Grandmother    Diabetes Maternal Grandfather    Hypertension Maternal Grandfather    No Known Allergies  Review of Systems  Constitutional: Negative.   HENT: Negative.    Eyes: Negative.   Respiratory:  Negative for shortness of breath.   Cardiovascular:  Negative for chest pain.  Gastrointestinal: Negative.   Genitourinary:  Negative for dysuria and hematuria.  Musculoskeletal:  Positive for back pain and myalgias.  Skin: Negative.   Neurological: Negative.   Endo/Heme/Allergies: Negative.   Psychiatric/Behavioral: Negative.        Objective:     BP 119/74 (BP Location: Right Arm, Patient Position: Sitting, Cuff Size: Large)   Pulse 99   Temp 98.2 F (36.8 C) (Oral)   Resp 16   Wt 254 lb (115.2 kg)   SpO2  97%   BMI 35.93 kg/m  BP Readings from Last 3 Encounters:  01/09/22 119/74  12/03/21 (!) 150/110  07/02/21 (!) 148/83   Wt Readings from Last 3 Encounters:  01/09/22 254 lb (115.2 kg)  07/02/21 263 lb 9.6 oz (119.6 kg)  08/14/20 250 lb 6.4 oz (113.6 kg)      Physical Exam Vitals and nursing note reviewed.  Constitutional:      Appearance: Normal appearance.  HENT:     Head: Normocephalic and atraumatic.     Right Ear: External ear normal.     Left Ear: External ear normal.     Nose: Nose normal.     Mouth/Throat:     Mouth: Mucous membranes are moist.     Pharynx: Oropharynx is clear.  Eyes:     Extraocular Movements: Extraocular movements intact.     Conjunctiva/sclera: Conjunctivae normal.     Pupils: Pupils are equal, round, and reactive to light.   Cardiovascular:     Rate and Rhythm: Rhythm irregular.  Pulmonary:     Effort: Pulmonary effort is normal.     Breath sounds: Normal breath sounds.  Musculoskeletal:        General: Normal range of motion.     Cervical back: Normal range of motion and neck supple.  Skin:    General: Skin is warm and dry.  Neurological:     General: No focal deficit present.     Mental Status: He is alert and oriented to person, place, and time.  Psychiatric:        Mood and Affect: Mood normal.        Behavior: Behavior normal.        Thought Content: Thought content normal.        Judgment: Judgment normal.        Assessment & Plan:   Problem List Items Addressed This Visit       Cardiovascular and Mediastinum   Essential hypertension (Chronic)   Relevant Medications   bisoprolol (ZEBETA) 10 MG tablet   amLODipine (NORVASC) 10 MG tablet   Chronic atrial fibrillation (HCC)   Relevant Medications   bisoprolol (ZEBETA) 10 MG tablet   amLODipine (NORVASC) 10 MG tablet     Endocrine   Controlled type 2 diabetes mellitus with hyperglycemia, without long-term current use of insulin (HCC) - Primary   Relevant Medications   metFORMIN (GLUCOPHAGE) 500 MG tablet   Other Relevant Orders   Comp. Metabolic Panel (12)     Other   Other hyperlipidemia (Chronic)   Relevant Medications   bisoprolol (ZEBETA) 10 MG tablet   amLODipine (NORVASC) 10 MG tablet   Vitamin D insufficiency   Relevant Orders   Vitamin D, 25-hydroxy   Acute right-sided low back pain without sciatica   Relevant Medications   cyclobenzaprine (FLEXERIL) 5 MG tablet   1. Controlled type 2 diabetes mellitus with hyperglycemia, without long-term current use of insulin (HCC) Encourage patient to start taking metformin on a daily basis, encouraged patient to follow low sugar diet, encouraged to check blood glucose levels, keep a written log and have available for all office visits.  Patient has upcoming appointment with  primary care provider in 1 month.  Patient unable to give urine sample for microalbumin/creatinine ratio lab - Comp. Metabolic Panel (12) - metFORMIN (GLUCOPHAGE) 500 MG tablet; Take 1 tablet (500 mg total) by mouth daily with breakfast.  Dispense: 90 tablet; Refill: 1   2. Chronic atrial fibrillation (HCC) Strongly encouraged  compliance to all medications.  Patient strongly encouraged to follow-up if missing any medications.  Patient understands and agrees.  3. Essential hypertension Resume bisoprolol, continue amlodipine.  Patient encouraged to check blood pressure at home, keep a written log and have available for all office visits.  Red flags given for prompt reevaluation - bisoprolol (ZEBETA) 10 MG tablet; Take 1 tablet by mouth every day  Dispense: 30 tablet; Refill: 2 - amLODipine (NORVASC) 10 MG tablet; Take 0.5 tablets (5 mg total) by mouth daily.  Dispense: 30 tablet; Refill: 1  4. Acute right-sided low back pain without sciatica Trial Flexeril.  Patient education given on supportive care, red flags for prompt reevaluation. - cyclobenzaprine (FLEXERIL) 5 MG tablet; Take 1 tablet (5 mg total) by mouth 3 (three) times daily as needed for muscle spasms.  Dispense: 30 tablet; Refill: 0  5. Vitamin D insufficiency  - Vitamin D, 25-hydroxy  6. Other hyperlipidemia Continue current regimen, no refill needed today.   I have reviewed the patient's medical history (PMH, PSH, Social History, Family History, Medications, and allergies) , and have been updated if relevant. I spent 30 minutes reviewing chart and  face to face time with patient.     Return in about 9 weeks (around 03/13/2022) for At [facility] (has appt with Dr. Delford Field).    Kasandra Knudsen Mayers, PA-C

## 2022-01-10 ENCOUNTER — Telehealth: Payer: Self-pay | Admitting: Critical Care Medicine

## 2022-01-10 ENCOUNTER — Telehealth: Payer: Self-pay

## 2022-01-10 ENCOUNTER — Encounter: Payer: Self-pay | Admitting: Physician Assistant

## 2022-01-10 DIAGNOSIS — M545 Low back pain, unspecified: Secondary | ICD-10-CM | POA: Insufficient documentation

## 2022-01-10 NOTE — Telephone Encounter (Signed)
[employer] called and spoke to [employer], Clinical biochemist rep about the message below. She says they received a lavendar top tube and want to know if a test needs to be run on it or if it's just an extra tube. I placed her on hold, called the office and spoke to [place], CMA. She says Dr. Delford Field is out of the office today and she will send the message to him and someone will call to let them know. I advised Crystal, she verbalized understanding.

## 2022-01-10 NOTE — Telephone Encounter (Signed)
[facility] stating they had an extra tube(lavender top) and is wanting to know if you want and extra test done?

## 2022-01-10 NOTE — Telephone Encounter (Signed)
Pls partner with Cari. She saw him   Dr Delford Field

## 2022-01-10 NOTE — Telephone Encounter (Signed)
Brian Moody calling from [facility] is calling to notify an extra sample. Are any additional testing needed? CB- [id number] Option [phone]

## 2022-01-11 LAB — COMP. METABOLIC PANEL (12)
AST: 16 IU/L (ref 0–40)
Albumin/Globulin Ratio: 1.4 (ref 1.2–2.2)
Albumin: 4.1 g/dL (ref 3.8–4.9)
Alkaline Phosphatase: 97 IU/L (ref 44–121)
BUN/Creatinine Ratio: 11 (ref 9–20)
BUN: 11 mg/dL (ref 6–24)
Bilirubin Total: <0.2 mg/dL (ref 0.0–1.2)
Calcium: 9.1 mg/dL (ref 8.7–10.2)
Chloride: 104 mmol/L (ref 96–106)
Creatinine, Ser: 1.04 mg/dL (ref 0.76–1.27)
Globulin, Total: 3 g/dL (ref 1.5–4.5)
Glucose: 71 mg/dL (ref 70–99)
Potassium: 3.9 mmol/L (ref 3.5–5.2)
Sodium: 138 mmol/L (ref 134–144)
Total Protein: 7.1 g/dL (ref 6.0–8.5)
eGFR: 84 mL/min/{1.73_m2} (ref >59)

## 2022-01-11 LAB — MICROALBUMIN / CREATININE URINE RATIO

## 2022-01-11 LAB — SPECIMEN STATUS REPORT

## 2022-01-11 LAB — VITAMIN D 25 HYDROXY (VIT D DEFICIENCY, FRACTURES): Vit D, 25-Hydroxy: 13.5 ng/mL — ABNORMAL LOW (ref 30.0–100.0)

## 2022-01-12 NOTE — Telephone Encounter (Signed)
Call was received on Triage phone and CMA Carly has informed [facility] of what to do with sample.

## 2022-01-14 ENCOUNTER — Ambulatory Visit: Payer: Medicare Other | Attending: Internal Medicine

## 2022-01-14 ENCOUNTER — Other Ambulatory Visit (HOSPITAL_BASED_OUTPATIENT_CLINIC_OR_DEPARTMENT_OTHER): Payer: Self-pay

## 2022-01-14 DIAGNOSIS — Z23 Encounter for immunization: Secondary | ICD-10-CM

## 2022-01-14 MED ORDER — MODERNA COVID-19 BIVAL BOOSTER 50 MCG/0.5ML IM SUSP
INTRAMUSCULAR | 0 refills | Status: DC
  Filled 2022-01-14: qty 0.5, 1d supply, fill #0

## 2022-01-14 MED ORDER — VITAMIN D (ERGOCALCIFEROL) 1.25 MG (50000 UNIT) PO CAPS: 50000.0000 [IU] | ORAL_CAPSULE | ORAL | 2 refills | Status: DC

## 2022-01-14 NOTE — Progress Notes (Signed)
   [id number]  Name:  Brian Moody.    MRN: [id number] DOB: 11-21-1965  01/14/2022  Brian Moody was observed post Covid-19 immunization for 15 minutes without incident. He was provided with Vaccine Information Sheet and instruction to access the V-Safe system.   Brian Moody was instructed to call 911 with any severe reactions post vaccine: Difficulty breathing  Swelling of face and throat  A fast heartbeat  A bad rash all over body  Dizziness and weakness   Immunizations Administered     Name Date Dose VIS Date Route   Moderna Covid-19 vaccine Bivalent Booster 01/14/2022  2:34 PM 0.5 mL 02/03/2021 Intramuscular   Manufacturer: Gala Murdoch   Lot: [id number]   NDC: 75916-384-66

## 2022-01-14 NOTE — Addendum Note (Signed)
Addended by: Roney Jaffe on: 01/14/2022 09:20 AM   Modules accepted: Orders

## 2022-03-06 ENCOUNTER — Other Ambulatory Visit: Payer: Self-pay | Admitting: Critical Care Medicine

## 2022-03-06 DIAGNOSIS — E7849 Other hyperlipidemia: Secondary | ICD-10-CM

## 2022-03-13 ENCOUNTER — Ambulatory Visit: Payer: Medicare Other | Admitting: Critical Care Medicine

## 2022-03-13 NOTE — Progress Notes (Deleted)
Established Patient Office Visit  Subjective:  Patient ID: Brian Sakai., male    DOB: 1966/03/22  Age: 56 y.o. MRN: [id number]  CC:  No chief complaint on file.   HPI Brian Moody. presents for follow-up of hypertension.   07/02/2021 Patient became confused as to whether he should be taking his blood pressure medicines daily or not but tickly when his insurance company sent him a mail order supply of prepackaged medications.  He has not been on his bisoprolol or amlodipine for about 2 weeks and on arrival blood pressure is 148/83.  When he has been compliant with his medicines blood pressure has been well controlled.  He currently does not have any symptoms referable to the blood pressure.  He is also still smoking 5 cigarettes daily.  Patient was in the emergency room last week for acute onset of pyelonephritis with left-sided flank pain.  He is now taking a course of Bactrim and has seen improvement in symptoms.  He is trying to hydrate himself more at this time.  Patient did declined the flu vaccine at this visit  Patient is due colon cancer screening and was sent a Cologuard kit but did not process it properly.  We will have him do a fecal occult kit here in the office.   9/20  Cardiovascular and Mediastinum   Essential hypertension - Primary (Chronic)    Not well controlled as patient's not currently taking the medications prescribed I went over his medicines in detail and he produced the organized medication kit sent to him by his insurance company and has the correct dose of bisoprolol and amlodipine I told him to resume this medication      Relevant Medications   sildenafil (VIAGRA) 100 MG tablet   Chronic atrial fibrillation (HCC)    History of chronic atrial fibrillation we will continue Eliquis      Relevant Medications   sildenafil (VIAGRA) 100 MG tablet     Other   Other hyperlipidemia (Chronic)    Recommended patient stay on atorvastatin      Relevant  Medications   sildenafil (VIAGRA) 100 MG tablet   Tobacco use       Current smoking consumption amount: Patient relapsed into smoking 5 cigarettes daily  Dicsussion on advise to quit smoking and smoking impacts: Cardiovascular impacts and lung impacts  Patient's willingness to quit: Focused on quitting again  Methods to quit smoking discussed: Nicotine replacement behavioral modification  Medication management of smoking session drugs discussed: Nicotine replacement  Resources provided:  AVS   Setting quit date not established  Follow-up arranged 6 weeks   Time spent counseling the patient: 3 minutes       Pre-diabetes    A1c was 6.4 at the last visit in February we will repeat at this visit      Relevant Orders   Hemoglobin M0L   Basic Metabolic Panel   Erectile dysfunction    Refills on Viagra given      RESOLVED: Dysuria    Dysuria has resolved and flank pain resolving on Bactrim      Other Visit Diagnoses     Colon cancer screening       Relevant Orders   Fecal occult blood, imunochemical   Past Medical History:  Diagnosis Date   Avulsion fracture of distal fibula 06/02/2013   Right distal fibula occurred 04/30/2013    Dysuria 08/14/2020   Hordeolum externum left upper eyelid 06/29/2019   Hypertension  Persistent atrial fibrillation ([place])    a. newly diagnosed on 05/2015 admission. started on coumadin.    Pre-diabetes    a. HgA1c 6.3 05/2015   Tobacco abuse     No past surgical history on file.  Family History  Problem Relation Age of Onset   Hypertension Father    Diabetes Maternal Grandmother    Hypertension Maternal Grandmother    Diabetes Maternal Grandfather    Hypertension Maternal Grandfather     Social History   Socioeconomic History   Marital status: Married    Spouse name: Not on file   Number of children: Not on file   Years of education: Not on file   Highest education level: Not on file  Occupational History   Not on  file  Tobacco Use   Smoking status: Every Day    Packs/day: 0.10    Types: Cigarettes    Passive exposure: Never   Smokeless tobacco: Never  Vaping Use   Vaping Use: Never used  Substance and Sexual Activity   Alcohol use: No   Drug use: No   Sexual activity: Not Currently  Other Topics Concern   Not on file  Social History Narrative   Not on file   Social Determinants of Health   Financial Resource Strain: Not on file  Food Insecurity: Not on file  Transportation Needs: Not on file  Physical Activity: Not on file  Stress: Not on file  Social Connections: Not on file  Intimate Partner Violence: Not on file    Outpatient Medications Prior to Visit  Medication Sig Dispense Refill   atorvastatin (LIPITOR) 10 MG tablet Take 1 tablet by mouth every day 30 tablet 0   acetaminophen (TYLENOL) 500 MG tablet Take 2 tablets (1,000 mg total) by mouth every 6 (six) hours as needed for headache. 60 tablet 2   albuterol (VENTOLIN HFA) 108 (90 Base) MCG/ACT inhaler Inhale 1-2 puffs into the lungs every 6 (six) hours as needed for wheezing. (Patient not taking: Reported on 01/09/2022) 1 each 0   amLODipine (NORVASC) 10 MG tablet Take 0.5 tablets (5 mg total) by mouth daily. 30 tablet 1   bisoprolol (ZEBETA) 10 MG tablet Take 1 tablet by mouth every day 30 tablet 2   blood glucose meter kit and supplies KIT Dispense based on patient and insurance preference. Use up to four times daily as directed. (FOR ICD-9 250.00, 250.01). (Patient not taking: Reported on 01/09/2022) 1 each 0   Blood Pressure Monitoring (5 SERIES BP MONITOR) DEVI 1 Units by Does not apply route daily. Measure blood pressure daily 1 each 0   COVID-19 mRNA bivalent vaccine, Moderna, (MODERNA COVID-19 BIVAL BOOSTER) 50 MCG/0.5ML injection Inject into the muscle. 0.5 mL 0   cyclobenzaprine (FLEXERIL) 5 MG tablet Take 1 tablet (5 mg total) by mouth 3 (three) times daily as needed for muscle spasms. 30 tablet 0   ELIQUIS 5 MG TABS  tablet Take 1 tablet by mouth twice daily. Appt needed 60 tablet 11   metFORMIN (GLUCOPHAGE) 500 MG tablet Take 1 tablet (500 mg total) by mouth daily with breakfast. 90 tablet 1   sildenafil (VIAGRA) 100 MG tablet Take 1 tablet by mouth every day as needed for erectile dysfunction (Patient not taking: Reported on 01/09/2022) 10 tablet 11   Vitamin D, Ergocalciferol, (DRISDOL) 1.25 MG (50000 UNIT) CAPS capsule Take 1 capsule (50,000 Units total) by mouth every 7 (seven) days. 4 capsule 2   No facility-administered medications prior to  visit.    No Known Allergies  ROS Review of Systems  Constitutional:  Negative for chills, diaphoresis and fever.  HENT:  Negative for congestion, hearing loss, nosebleeds, sore throat and tinnitus.   Eyes:  Negative for photophobia and redness.  Respiratory:  Negative for cough, shortness of breath, wheezing and stridor.   Cardiovascular:  Negative for chest pain, palpitations and leg swelling.  Gastrointestinal:  Negative for abdominal pain, blood in stool, constipation, diarrhea, nausea and vomiting.  Endocrine: Negative for polydipsia.  Genitourinary:  Positive for dysuria and flank pain. Negative for frequency, hematuria and urgency.  Musculoskeletal:  Negative for back pain, myalgias and neck pain.  Skin:  Negative for rash.  Allergic/Immunologic: Negative for environmental allergies.  Neurological:  Negative for dizziness, tremors, seizures, weakness and headaches.  Hematological:  Does not bruise/bleed easily.  Psychiatric/Behavioral:  Negative for suicidal ideas. The patient is not nervous/anxious.       Objective:    Physical Exam Vitals reviewed.  Constitutional:      Appearance: Normal appearance. He is well-developed. He is not diaphoretic.  HENT:     Head: Normocephalic and atraumatic.     Nose: No nasal deformity, septal deviation, mucosal edema or rhinorrhea.     Right Sinus: No maxillary sinus tenderness or frontal sinus tenderness.      Left Sinus: No maxillary sinus tenderness or frontal sinus tenderness.     Mouth/Throat:     Pharynx: No oropharyngeal exudate.  Eyes:     General: No scleral icterus.    Conjunctiva/sclera: Conjunctivae normal.     Pupils: Pupils are equal, round, and reactive to light.  Neck:     Thyroid: No thyromegaly.     Vascular: No carotid bruit or JVD.     Trachea: Trachea normal. No tracheal tenderness or tracheal deviation.  Cardiovascular:     Rate and Rhythm: Normal rate and regular rhythm.     Chest Wall: PMI is not displaced.     Pulses: Normal pulses. No decreased pulses.     Heart sounds: Normal heart sounds, S1 normal and S2 normal. Heart sounds not distant. No murmur heard.    No systolic murmur is present.     No diastolic murmur is present.     No friction rub. No gallop. No S3 or S4 sounds.  Pulmonary:     Effort: Pulmonary effort is normal. No tachypnea, accessory muscle usage or respiratory distress.     Breath sounds: Normal breath sounds. No stridor. No decreased breath sounds, wheezing, rhonchi or rales.  Chest:     Chest wall: No tenderness.  Abdominal:     General: Bowel sounds are normal. There is no distension.     Palpations: Abdomen is soft. Abdomen is not rigid.     Tenderness: There is no abdominal tenderness. There is right CVA tenderness. There is no left CVA tenderness, guarding or rebound.     Comments: Right costovertebral angle tenderness is improved from documentation in the emergency room  Musculoskeletal:        General: Normal range of motion.     Cervical back: Normal range of motion and neck supple. No edema, erythema or rigidity. No muscular tenderness. Normal range of motion.     Right lower leg: No edema.     Left lower leg: No edema.  Lymphadenopathy:     Head:     Right side of head: No submental or submandibular adenopathy.     Left side of head:  No submental or submandibular adenopathy.     Cervical: No cervical adenopathy.  Skin:     General: Skin is warm and dry.     Coloration: Skin is not pale.     Findings: No rash.     Nails: There is no clubbing.  Neurological:     General: No focal deficit present.     Mental Status: He is alert and oriented to person, place, and time. Mental status is at baseline.     Sensory: No sensory deficit.  Psychiatric:        Mood and Affect: Mood normal.        Speech: Speech normal.        Behavior: Behavior normal.        Thought Content: Thought content normal.     There were no vitals taken for this visit. Wt Readings from Last 3 Encounters:  01/09/22 254 lb (115.2 kg)  07/02/21 263 lb 9.6 oz (119.6 kg)  08/14/20 250 lb 6.4 oz (113.6 kg)     Health Maintenance Due  Topic Date Due   FOOT EXAM  Never done   OPHTHALMOLOGY EXAM  Never done   TETANUS/TDAP  Never done   COLON CANCER SCREENING ANNUAL FOBT  Never done   Zoster Vaccines- Shingrix (1 of 2) Never done   HEMOGLOBIN A1C  12/30/2021   INFLUENZA VACCINE  Never done    There are no preventive care reminders to display for this patient.  Lab Results  Component Value Date   TSH 2.678 06/22/2015   Lab Results  Component Value Date   WBC 8.1 08/14/2020   HGB 16.2 08/14/2020   HCT 49.2 08/14/2020   MCV 90 08/14/2020   PLT 244 08/14/2020   Lab Results  Component Value Date   NA 138 01/09/2022   K 3.9 01/09/2022   CO2 24 07/02/2021   GLUCOSE 71 01/09/2022   BUN 11 01/09/2022   CREATININE 1.04 01/09/2022   BILITOT <0.2 01/09/2022   ALKPHOS 97 01/09/2022   AST 16 01/09/2022   ALT 21 08/14/2020   PROT 7.1 01/09/2022   ALBUMIN 4.1 01/09/2022   CALCIUM 9.1 01/09/2022   ANIONGAP 9 06/28/2021   EGFR 84 01/09/2022   Lab Results  Component Value Date   CHOL 185 08/14/2020   Lab Results  Component Value Date   HDL 46 08/14/2020   Lab Results  Component Value Date   LDLCALC 119 (H) 08/14/2020   Lab Results  Component Value Date   TRIG 112 08/14/2020   Lab Results  Component Value Date    CHOLHDL 4.0 08/14/2020   Lab Results  Component Value Date   HGBA1C 6.8 (H) 07/02/2021      Assessment & Plan:   Problem List Items Addressed This Visit   None  No orders of the defined types were placed in this encounter.  Patient will receive fecal occult kit for colon cancer screening Follow-up: No follow-ups on file.    Asencion Noble, MD

## 2022-03-23 ENCOUNTER — Emergency Department (HOSPITAL_COMMUNITY): Payer: Medicare Other

## 2022-03-23 ENCOUNTER — Inpatient Hospital Stay (HOSPITAL_COMMUNITY)
Admission: EM | Admit: 2022-03-23 | Discharge: 2022-03-25 | DRG: 024 | Disposition: A | Discharge status: Home / Self Care | Payer: Medicare Other | Attending: Neurology | Admitting: Neurology

## 2022-03-23 ENCOUNTER — Other Ambulatory Visit: Payer: Self-pay

## 2022-03-23 ENCOUNTER — Encounter (HOSPITAL_COMMUNITY): Payer: Self-pay | Admitting: Emergency Medicine

## 2022-03-23 DIAGNOSIS — R29706 NIHSS score 6: Secondary | ICD-10-CM | POA: Diagnosis not present

## 2022-03-23 DIAGNOSIS — E785 Hyperlipidemia, unspecified: Secondary | ICD-10-CM | POA: Diagnosis present

## 2022-03-23 DIAGNOSIS — Z833 Family history of diabetes mellitus: Secondary | ICD-10-CM | POA: Diagnosis not present

## 2022-03-23 DIAGNOSIS — Z91148 Patient's other noncompliance with medication regimen for other reason: Secondary | ICD-10-CM | POA: Diagnosis not present

## 2022-03-23 DIAGNOSIS — I10 Essential (primary) hypertension: Secondary | ICD-10-CM | POA: Diagnosis not present

## 2022-03-23 DIAGNOSIS — I482 Chronic atrial fibrillation, unspecified: Secondary | ICD-10-CM | POA: Diagnosis not present

## 2022-03-23 DIAGNOSIS — F17210 Nicotine dependence, cigarettes, uncomplicated: Secondary | ICD-10-CM | POA: Diagnosis present

## 2022-03-23 DIAGNOSIS — E119 Type 2 diabetes mellitus without complications: Secondary | ICD-10-CM | POA: Diagnosis not present

## 2022-03-23 DIAGNOSIS — E1165 Type 2 diabetes mellitus with hyperglycemia: Secondary | ICD-10-CM | POA: Diagnosis present

## 2022-03-23 DIAGNOSIS — W1839XA Other fall on same level, initial encounter: Secondary | ICD-10-CM | POA: Diagnosis present

## 2022-03-23 DIAGNOSIS — H1133 Conjunctival hemorrhage, bilateral: Secondary | ICD-10-CM | POA: Diagnosis not present

## 2022-03-23 DIAGNOSIS — I639 Cerebral infarction, unspecified: Secondary | ICD-10-CM | POA: Diagnosis present

## 2022-03-23 DIAGNOSIS — W19XXXA Unspecified fall, initial encounter: Secondary | ICD-10-CM | POA: Diagnosis not present

## 2022-03-23 DIAGNOSIS — Z7901 Long term (current) use of anticoagulants: Secondary | ICD-10-CM | POA: Diagnosis not present

## 2022-03-23 DIAGNOSIS — G8194 Hemiplegia, unspecified affecting left nondominant side: Secondary | ICD-10-CM | POA: Diagnosis not present

## 2022-03-23 DIAGNOSIS — R414 Neurologic neglect syndrome: Secondary | ICD-10-CM | POA: Diagnosis present

## 2022-03-23 DIAGNOSIS — I6601 Occlusion and stenosis of right middle cerebral artery: Secondary | ICD-10-CM | POA: Diagnosis not present

## 2022-03-23 DIAGNOSIS — I4819 Other persistent atrial fibrillation: Secondary | ICD-10-CM | POA: Diagnosis not present

## 2022-03-23 DIAGNOSIS — R29810 Facial weakness: Secondary | ICD-10-CM | POA: Diagnosis not present

## 2022-03-23 DIAGNOSIS — E7849 Other hyperlipidemia: Secondary | ICD-10-CM | POA: Diagnosis present

## 2022-03-23 DIAGNOSIS — E669 Obesity, unspecified: Secondary | ICD-10-CM | POA: Diagnosis present

## 2022-03-23 DIAGNOSIS — Z79899 Other long term (current) drug therapy: Secondary | ICD-10-CM | POA: Diagnosis not present

## 2022-03-23 DIAGNOSIS — I6389 Other cerebral infarction: Secondary | ICD-10-CM | POA: Diagnosis not present

## 2022-03-23 DIAGNOSIS — R410 Disorientation, unspecified: Secondary | ICD-10-CM | POA: Diagnosis not present

## 2022-03-23 DIAGNOSIS — Z87891 Personal history of nicotine dependence: Secondary | ICD-10-CM | POA: Diagnosis present

## 2022-03-23 DIAGNOSIS — Z6835 Body mass index (BMI) 35.0-35.9, adult: Secondary | ICD-10-CM

## 2022-03-23 DIAGNOSIS — Z8249 Family history of ischemic heart disease and other diseases of the circulatory system: Secondary | ICD-10-CM

## 2022-03-23 DIAGNOSIS — Z720 Tobacco use: Secondary | ICD-10-CM | POA: Diagnosis present

## 2022-03-23 DIAGNOSIS — R531 Weakness: Secondary | ICD-10-CM | POA: Diagnosis not present

## 2022-03-23 DIAGNOSIS — G8190 Hemiplegia, unspecified affecting unspecified side: Secondary | ICD-10-CM | POA: Diagnosis not present

## 2022-03-23 DIAGNOSIS — R4781 Slurred speech: Secondary | ICD-10-CM | POA: Diagnosis present

## 2022-03-23 DIAGNOSIS — I63411 Cerebral infarction due to embolism of right middle cerebral artery: Secondary | ICD-10-CM | POA: Diagnosis not present

## 2022-03-23 DIAGNOSIS — Z7984 Long term (current) use of oral hypoglycemic drugs: Secondary | ICD-10-CM | POA: Diagnosis not present

## 2022-03-23 DIAGNOSIS — Y92002 Bathroom of unspecified non-institutional (private) residence single-family (private) house as the place of occurrence of the external cause: Secondary | ICD-10-CM

## 2022-03-23 DIAGNOSIS — R29898 Other symptoms and signs involving the musculoskeletal system: Secondary | ICD-10-CM | POA: Diagnosis not present

## 2022-03-23 DIAGNOSIS — Z8673 Personal history of transient ischemic attack (TIA), and cerebral infarction without residual deficits: Secondary | ICD-10-CM | POA: Diagnosis not present

## 2022-03-23 LAB — DIFFERENTIAL
Abs Immature Granulocytes: 0.01 10*3/uL (ref 0.00–0.07)
Basophils Absolute: 0 10*3/uL (ref 0.0–0.1)
Basophils Relative: 1 %
Eosinophils Absolute: 0.1 10*3/uL (ref 0.0–0.5)
Eosinophils Relative: 1 %
Immature Granulocytes: 0 %
Lymphocytes Relative: 36 %
Lymphs Abs: 2.9 10*3/uL (ref 0.7–4.0)
Monocytes Absolute: 0.8 10*3/uL (ref 0.1–1.0)
Monocytes Relative: 10 %
Neutro Abs: 4.2 10*3/uL (ref 1.7–7.7)
Neutrophils Relative %: 52 %

## 2022-03-23 LAB — I-STAT CHEM 8, ED
BUN: 17 mg/dL (ref 6–20)
Calcium, Ion: 1.03 mmol/L — ABNORMAL LOW (ref 1.15–1.40)
Chloride: 104 mmol/L (ref 98–111)
Creatinine, Ser: 1.1 mg/dL (ref 0.61–1.24)
Glucose, Bld: 135 mg/dL — ABNORMAL HIGH (ref 70–99)
HCT: 44 % (ref 39.0–52.0)
Hemoglobin: 15 g/dL (ref 13.0–17.0)
Potassium: 3.2 mmol/L — ABNORMAL LOW (ref 3.5–5.1)
Sodium: 141 mmol/L (ref 135–145)
TCO2: 24 mmol/L (ref 22–32)

## 2022-03-23 LAB — CBC
HCT: 44 % (ref 39.0–52.0)
Hemoglobin: 14.7 g/dL (ref 13.0–17.0)
MCH: 29.9 pg (ref 26.0–34.0)
MCHC: 33.4 g/dL (ref 30.0–36.0)
MCV: 89.6 fL (ref 80.0–100.0)
Platelets: 214 10*3/uL (ref 150–400)
RBC: 4.91 MIL/uL (ref 4.22–5.81)
RDW: 14.6 % (ref 11.5–15.5)
WBC: 8.1 10*3/uL (ref 4.0–10.5)
nRBC: 0 % (ref 0.0–0.2)

## 2022-03-23 LAB — PROTIME-INR
INR: 1.1 (ref 0.8–1.2)
Prothrombin Time: 14.3 seconds (ref 11.4–15.2)

## 2022-03-23 LAB — APTT: aPTT: 33 seconds (ref 24–36)

## 2022-03-23 LAB — HEPARIN LEVEL (UNFRACTIONATED): Heparin Unfractionated: <0.1 IU/mL — ABNORMAL LOW (ref 0.30–0.70)

## 2022-03-23 MED ORDER — LORAZEPAM 2 MG/ML IJ SOLN
INTRAMUSCULAR | Status: AC
  Filled 2022-03-23: qty 1

## 2022-03-23 MED ORDER — TENECTEPLASE FOR STROKE
25.0000 mg | PACK | Freq: Once | INTRAVENOUS | Status: AC
  Administered 2022-03-23: 25 mg via INTRAVENOUS
  Filled 2022-03-23: qty 10

## 2022-03-23 MED ORDER — SODIUM CHLORIDE 0.9% FLUSH
3.0000 mL | Freq: Once | INTRAVENOUS | Status: AC
  Administered 2022-03-24: 3 mL via INTRAVENOUS

## 2022-03-23 MED ORDER — IOHEXOL 350 MG/ML SOLN
75.0000 mL | Freq: Once | INTRAVENOUS | Status: AC | PRN
  Administered 2022-03-23: 75 mL via INTRAVENOUS

## 2022-03-23 NOTE — ED Notes (Signed)
Patient arrived by EMS at bridge.

## 2022-03-23 NOTE — ED Notes (Signed)
Stroke team arrived

## 2022-03-23 NOTE — ED Provider Notes (Signed)
[place] [facility] EMERGENCY DEPARTMENT Provider Note   CSN: [id number] Arrival date & time: 03/23/22  2310  An emergency department physician performed an initial assessment on this suspected stroke patient at 2311.  History  Chief Complaint  Patient presents with   Code Stroke    Brian Moody. is a 56 y.o. male.  The history is provided by the patient, the EMS personnel and medical records.  Brian Moody. is a 56 y.o. male who presents to the Emergency Department complaining of code stroke.  Ems brings pt in for code stroke.  Last known well at 2245.  He went to use the bathroom and became abruptly dizzy and fell to the ground.  Has left sided weakness.  He takes eliquis for hx of afib but has been off of his medication for one week due to it not being delivered.  Level V caveat due to confusion.      Home Medications Prior to Admission medications   Medication Sig Start Date End Date Taking? Authorizing Provider  atorvastatin (LIPITOR) 10 MG tablet Take 1 tablet by mouth every day 03/06/22   Elsie Stain, MD  acetaminophen (TYLENOL) 500 MG tablet Take 2 tablets (1,000 mg total) by mouth every 6 (six) hours as needed for headache. 05/05/20   Ladell Pier, MD  albuterol (VENTOLIN HFA) 108 (90 Base) MCG/ACT inhaler Inhale 1-2 puffs into the lungs every 6 (six) hours as needed for wheezing. Patient not taking: Reported on 01/09/2022 07/02/21   Elsie Stain, MD  amLODipine (NORVASC) 10 MG tablet Take 0.5 tablets (5 mg total) by mouth daily. 01/09/22   Mayers, Cari S, PA-C  bisoprolol (ZEBETA) 10 MG tablet Take 1 tablet by mouth every day 01/09/22   Mayers, Cari S, PA-C  blood glucose meter kit and supplies KIT Dispense based on patient and insurance preference. Use up to four times daily as directed. (FOR ICD-9 250.00, 250.01). Patient not taking: Reported on 01/09/2022 06/29/19   Elsie Stain, MD  Blood Pressure Monitoring (5 SERIES BP MONITOR)  DEVI 1 Units by Does not apply route daily. Measure blood pressure daily 06/29/19   Elsie Stain, MD  COVID-19 mRNA bivalent vaccine, Moderna, (MODERNA COVID-19 BIVAL BOOSTER) 50 MCG/0.5ML injection Inject into the muscle. 01/14/22   Carlyle Basques, MD  cyclobenzaprine (FLEXERIL) 5 MG tablet Take 1 tablet (5 mg total) by mouth 3 (three) times daily as needed for muscle spasms. 01/09/22   Mayers, Cari S, PA-C  ELIQUIS 5 MG TABS tablet Take 1 tablet by mouth twice daily. Appt needed 08/15/21   Elsie Stain, MD  metFORMIN (GLUCOPHAGE) 500 MG tablet Take 1 tablet (500 mg total) by mouth daily with breakfast. 01/09/22   Mayers, Cari S, PA-C  sildenafil (VIAGRA) 100 MG tablet Take 1 tablet by mouth every day as needed for erectile dysfunction Patient not taking: Reported on 01/09/2022 09/05/21   Elsie Stain, MD  Vitamin D, Ergocalciferol, (DRISDOL) 1.25 MG (50000 UNIT) CAPS capsule Take 1 capsule (50,000 Units total) by mouth every 7 (seven) days. 01/14/22   Mayers, Cari S, PA-C  carvedilol (COREG) 12.5 MG tablet Take 1 tablet (12.5 mg total) by mouth 2 (two) times daily with a meal. 03/02/20 08/14/20  Elsie Stain, MD      Allergies    Patient has no known allergies.    Review of Systems   Review of Systems  Unable to perform ROS: Acuity of condition  Physical Exam Updated Vital Signs BP (!) 149/78   Pulse 90   Temp 98.4 F (36.9 C) (Oral)   Resp 19   Ht 5' 10.5" (1.791 m)   SpO2 96%   BMI 35.93 kg/m  Physical Exam Vitals and nursing note reviewed.  Constitutional:      General: He is in acute distress.     Appearance: He is well-developed. He is ill-appearing and diaphoretic.  HENT:     Head: Normocephalic and atraumatic.  Cardiovascular:     Rate and Rhythm: Normal rate and regular rhythm.     Heart sounds: No murmur heard. Pulmonary:     Effort: Pulmonary effort is normal. No respiratory distress.     Breath sounds: Normal breath sounds.  Abdominal:      Palpations: Abdomen is soft.     Tenderness: There is no abdominal tenderness. There is no guarding or rebound.  Musculoskeletal:        General: No tenderness.  Skin:    General: Skin is warm.  Neurological:     Mental Status: He is alert.     Comments: LUE weakness, left facial weakness.  Altered sensation to light touch in L hemibody.  Left sided neglect.    Psychiatric:        Behavior: Behavior normal.     ED Results / Procedures / Treatments   Labs (all labs ordered are listed, but only abnormal results are displayed) Labs Reviewed  COMPREHENSIVE METABOLIC PANEL - Abnormal; Notable for the following components:      Result Value   Potassium 3.3 (*)    Glucose, Bld 140 (*)    All other components within normal limits  HEPARIN LEVEL (UNFRACTIONATED) - Abnormal; Notable for the following components:   Heparin Unfractionated <0.10 (*)    All other components within normal limits  LIPID PANEL - Abnormal; Notable for the following components:   HDL 34 (*)    All other components within normal limits  HEMOGLOBIN A1C - Abnormal; Notable for the following components:   Hgb A1c MFr Bld 5.8 (*)    All other components within normal limits  I-STAT CHEM 8, ED - Abnormal; Notable for the following components:   Potassium 3.2 (*)    Glucose, Bld 135 (*)    Calcium, Ion 1.03 (*)    All other components within normal limits  MRSA NEXT GEN BY PCR, NASAL  PROTIME-INR  APTT  CBC  DIFFERENTIAL  ETHANOL  HIV ANTIBODY (ROUTINE TESTING W REFLEX)  CBG MONITORING, ED    EKG None  Radiology CT ANGIO HEAD NECK W WO CM (CODE STROKE)  Result Date: 03/23/2022 CLINICAL DATA:  Left facial droop, slurred speech EXAM: CT ANGIOGRAPHY HEAD AND NECK TECHNIQUE: Multidetector CT imaging of the head and neck was performed using the standard protocol during bolus administration of intravenous contrast. Multiplanar CT image reconstructions and MIPs were obtained to evaluate the vascular anatomy.  Carotid stenosis measurements (when applicable) are obtained utilizing NASCET criteria, using the distal internal carotid diameter as the denominator. RADIATION DOSE REDUCTION: This exam was performed according to the departmental dose-optimization program which includes automated exposure control, adjustment of the mA and/or kV according to patient size and/or use of iterative reconstruction technique. CONTRAST:  45m OMNIPAQUE IOHEXOL 350 MG/ML SOLN COMPARISON:  No prior CTA available, correlation is made with CT head 03/23/2022 FINDINGS: CT HEAD FINDINGS For noncontrast findings, please see same day CT head. CTA NECK FINDINGS Aortic arch: Two-vessel arch with a  common origin of the brachiocephalic and left common carotid arteries. Imaged portion shows no evidence of aneurysm or dissection. No significant stenosis of the major arch vessel origins. Right carotid system: No evidence of dissection, occlusion, or hemodynamically significant stenosis (greater than 50%). Left carotid system: No evidence of dissection, occlusion, or hemodynamically significant stenosis (greater than 50%). Vertebral arteries: No evidence of dissection, occlusion, or hemodynamically significant stenosis (greater than 50%). Skeleton: No acute osseous abnormality. Other neck: No acute finding. Upper chest: No focal pulmonary opacity or pleural effusion. Review of the MIP images confirms the above findings CTA HEAD FINDINGS Anterior circulation: Both internal carotid arteries are patent to the termini, without significant stenosis. Patent left A1. Aplastic right A1. Normal anterior communicating artery. Anterior cerebral arteries are patent to their distal aspects. No M1 stenosis or occlusion. A right M2 appears occluded, just proximal to the bifurcation (series 7, image [id number]), with nonopacification of the more inferior M3 branch and opacification of the more superior branch. MCA branches otherwise perfused and symmetric. Posterior  circulation: Vertebral arteries patent to the vertebrobasilar junction without stenosis, although the left V4 is quite diminutive after the PICA takeoff. Posterior inferior cerebellar arteries patent proximally. Basilar patent to its distal aspect. Superior cerebellar arteries patent proximally. Patent P1 segments, diminutive on the right. Near fetal origin of the right PCA from a prominent right posterior communicating artery. The left posterior communicating artery is also patent. PCAs perfused to their distal aspects without stenosis. Venous sinuses: As permitted by contrast timing, patent. Anatomic variants: Near fetal origin of the right PCA. Review of the MIP images confirms the above findings IMPRESSION: 1. Occlusion of a distal right M2, near the bifurcation, with nonopacification of the more inferior M3 branch and opacification of the more superior branch. 2. Otherwise no intracranial hemodynamically significant stenosis. 3.  No hemodynamically significant stenosis in the neck. These findings were discussed by telephone on 03/23/2022 at 11:46 pm with provider MCNEILL KIRKPATRICK . Electronically Signed   By: Merilyn Baba M.D.   On: 03/23/2022 23:55   CT HEAD CODE STROKE WO CONTRAST  Result Date: 03/23/2022 CLINICAL DATA:  Code stroke. Left-sided weakness, facial droop, slurred speech EXAM: CT HEAD WITHOUT CONTRAST TECHNIQUE: Contiguous axial images were obtained from the base of the skull through the vertex without intravenous contrast. RADIATION DOSE REDUCTION: This exam was performed according to the departmental dose-optimization program which includes automated exposure control, adjustment of the mA and/or kV according to patient size and/or use of iterative reconstruction technique. COMPARISON:  None Available. FINDINGS: Brain: No evidence of acute infarction, hemorrhage, cerebral edema, mass, mass effect, or midline shift. No hydrocephalus or extra-axial collection. Vascular: No hyperdense vessel.  Skull: Negative for fracture or focal lesion. Sinuses/Orbits: No acute finding. Other: The mastoid air cells are well aerated. ASPECTS [facility] Stroke Program Early CT Score) - Ganglionic level infarction (caudate, lentiform nuclei, internal capsule, insula, M1-M3 cortex): 7 - Supraganglionic infarction (M4-M6 cortex): 3 Total score (0-10 with 10 being normal): 10 IMPRESSION: No evidence of acute intracranial process. ASPECTS is 10. Code stroke imaging results were communicated on 03/23/2022 at 11:33 pm to provider Dr. Leonel Ramsay via secure text paging. Electronically Signed   By: Merilyn Baba M.D.   On: 03/23/2022 23:33    Procedures Procedures   CRITICAL CARE Performed by: Quintella Reichert   Total critical care time: 20 minutes  Critical care time was exclusive of separately billable procedures and treating other patients.  Critical care was necessary to treat or  prevent imminent or life-threatening deterioration.  Critical care was time spent personally by me on the following activities: development of treatment plan with patient and/or surrogate as well as nursing, discussions with consultants, evaluation of patient's response to treatment, examination of patient, obtaining history from patient or surrogate, ordering and performing treatments and interventions, ordering and review of laboratory studies, ordering and review of radiographic studies, pulse oximetry and re-evaluation of patient's condition.  Medications Ordered in ED Medications  0.9 %  sodium chloride infusion ( Intravenous Infusion Verify 03/24/22 0500)  acetaminophen (TYLENOL) tablet 650 mg (has no administration in time range)    Or  acetaminophen (TYLENOL) 160 MG/5ML solution 650 mg (has no administration in time range)    Or  acetaminophen (TYLENOL) suppository 650 mg (has no administration in time range)  labetalol (NORMODYNE) injection 10 mg (has no administration in time range)    And  nicardipine (CARDENE) 50m in  0.86% saline 2066mIV infusion (0.1 mg/ml) (has no administration in time range)  pantoprazole (PROTONIX) injection 40 mg (40 mg Intravenous Given 03/24/22 0408)  fentaNYL (SUBLIMAZE) 100 MCG/2ML injection (  Not Given 03/24/22 0332)  Chlorhexidine Gluconate Cloth 2 % PADS 6 each (has no administration in time range)  potassium chloride SA (KLOR-CON M) CR tablet 20 mEq (has no administration in time range)  sodium chloride flush (NS) 0.9 % injection 3 mL (3 mLs Intravenous Given 03/24/22 0332)  tenecteplase (TNKASE) injection for Stroke 25 mg (25 mg Intravenous Given 03/23/22 2340)  iohexol (OMNIPAQUE) 350 MG/ML injection 75 mL (75 mLs Intravenous Contrast Given 03/23/22 2339)   stroke: early stages of recovery book ( Does not apply Given 03/24/22 0322)  verapamil (ISOPTIN) 2.5 MG/ML injection (5 mg Intra-arterial Given by Other 03/24/22 0129)  iohexol (OMNIPAQUE) 300 MG/ML solution 100 mL (50 mLs Intra-arterial Contrast Given 03/24/22 0152)    ED Course/ Medical Decision Making/ A&P                           Medical Decision Making Amount and/or Complexity of Data Reviewed Labs: ordered. Radiology: ordered.  Risk Decision regarding hospitalization.   Patient with history of atrial fibrillation, off of anticoagulation due to delivery problems.  He is here as a code stroke for new onset left-sided weakness.  Patient with dense deficits to the left upper extremity, left face and left-sided neglect.  Patient became agitated in the CT scanner, required redirecting in order to obtain CT.  He was treated with tenecteplase.  CTA with large vessel occlusion, plan to transfer to IR for ongoing management.        Final Clinical Impression(s) / ED Diagnoses Final diagnoses:  [facility]   [facility] / [facility] Orders     None         ReQuintella ReichertMD 03/24/22 [phone]

## 2022-03-23 NOTE — Progress Notes (Signed)
PHARMACIST CODE STROKE RESPONSE  Notified to mix TNK at 2336 by Dr. Leonel Ramsay TNK preparation completed at 2339  TNK dose = 25 mg IV over 5 seconds  Issues/delays encountered (if applicable): None  Brian Moody 03/23/22 11:40 PM

## 2022-03-23 NOTE — ED Notes (Signed)
Neurologist arrived.

## 2022-03-23 NOTE — ED Triage Notes (Addendum)
Patient BIB [facility] c/o left side weakness that started at 2245.  Patient reports he went to the restroom, and when he attempted to get off of the toilet, he fell, noting that his left side was weak.  Patient also reports that he has not taken his eliquis for 1-2 weeks due to it not being sent by the pharmacy.  Patient had left side weakness, left side facial droop upon assessment.  CBG 145 170/110

## 2022-03-23 NOTE — ED Notes (Signed)
Phlebotomist arrived.

## 2022-03-23 NOTE — ED Notes (Signed)
Patient arrived in CT. 

## 2022-03-23 NOTE — ED Notes (Signed)
Patient cleared by EDP for CT.

## 2022-03-24 ENCOUNTER — Inpatient Hospital Stay (HOSPITAL_COMMUNITY): Payer: Medicare Other

## 2022-03-24 ENCOUNTER — Inpatient Hospital Stay (HOSPITAL_COMMUNITY): Payer: Medicare Other | Admitting: Registered Nurse

## 2022-03-24 ENCOUNTER — Encounter (HOSPITAL_COMMUNITY)
Admission: EM | Disposition: A | Discharge status: Home / Self Care | Payer: Self-pay | Source: Home / Self Care | Attending: Neurology

## 2022-03-24 DIAGNOSIS — E119 Type 2 diabetes mellitus without complications: Secondary | ICD-10-CM | POA: Diagnosis not present

## 2022-03-24 DIAGNOSIS — R29810 Facial weakness: Secondary | ICD-10-CM | POA: Diagnosis present

## 2022-03-24 DIAGNOSIS — Z7984 Long term (current) use of oral hypoglycemic drugs: Secondary | ICD-10-CM

## 2022-03-24 DIAGNOSIS — Z8249 Family history of ischemic heart disease and other diseases of the circulatory system: Secondary | ICD-10-CM | POA: Diagnosis not present

## 2022-03-24 DIAGNOSIS — W1839XA Other fall on same level, initial encounter: Secondary | ICD-10-CM | POA: Diagnosis present

## 2022-03-24 DIAGNOSIS — Z833 Family history of diabetes mellitus: Secondary | ICD-10-CM | POA: Diagnosis not present

## 2022-03-24 DIAGNOSIS — I4819 Other persistent atrial fibrillation: Secondary | ICD-10-CM

## 2022-03-24 DIAGNOSIS — F17200 Nicotine dependence, unspecified, uncomplicated: Secondary | ICD-10-CM | POA: Diagnosis not present

## 2022-03-24 DIAGNOSIS — F17210 Nicotine dependence, cigarettes, uncomplicated: Secondary | ICD-10-CM | POA: Diagnosis present

## 2022-03-24 DIAGNOSIS — I63511 Cerebral infarction due to unspecified occlusion or stenosis of right middle cerebral artery: Secondary | ICD-10-CM | POA: Diagnosis not present

## 2022-03-24 DIAGNOSIS — I6389 Other cerebral infarction: Secondary | ICD-10-CM

## 2022-03-24 DIAGNOSIS — I10 Essential (primary) hypertension: Secondary | ICD-10-CM | POA: Diagnosis not present

## 2022-03-24 DIAGNOSIS — R531 Weakness: Secondary | ICD-10-CM | POA: Diagnosis present

## 2022-03-24 DIAGNOSIS — I63411 Cerebral infarction due to embolism of right middle cerebral artery: Secondary | ICD-10-CM | POA: Diagnosis not present

## 2022-03-24 DIAGNOSIS — Z6835 Body mass index (BMI) 35.0-35.9, adult: Secondary | ICD-10-CM | POA: Diagnosis not present

## 2022-03-24 DIAGNOSIS — Z8673 Personal history of transient ischemic attack (TIA), and cerebral infarction without residual deficits: Secondary | ICD-10-CM

## 2022-03-24 DIAGNOSIS — I639 Cerebral infarction, unspecified: Secondary | ICD-10-CM | POA: Diagnosis not present

## 2022-03-24 DIAGNOSIS — E785 Hyperlipidemia, unspecified: Secondary | ICD-10-CM | POA: Diagnosis present

## 2022-03-24 DIAGNOSIS — I482 Chronic atrial fibrillation, unspecified: Secondary | ICD-10-CM | POA: Diagnosis not present

## 2022-03-24 DIAGNOSIS — Z7901 Long term (current) use of anticoagulants: Secondary | ICD-10-CM | POA: Diagnosis not present

## 2022-03-24 DIAGNOSIS — E1165 Type 2 diabetes mellitus with hyperglycemia: Secondary | ICD-10-CM | POA: Diagnosis not present

## 2022-03-24 DIAGNOSIS — I6521 Occlusion and stenosis of right carotid artery: Secondary | ICD-10-CM | POA: Diagnosis not present

## 2022-03-24 DIAGNOSIS — Y92002 Bathroom of unspecified non-institutional (private) residence single-family (private) house as the place of occurrence of the external cause: Secondary | ICD-10-CM | POA: Diagnosis not present

## 2022-03-24 DIAGNOSIS — Z79899 Other long term (current) drug therapy: Secondary | ICD-10-CM | POA: Diagnosis not present

## 2022-03-24 DIAGNOSIS — E7800 Pure hypercholesterolemia, unspecified: Secondary | ICD-10-CM

## 2022-03-24 DIAGNOSIS — E669 Obesity, unspecified: Secondary | ICD-10-CM | POA: Diagnosis present

## 2022-03-24 DIAGNOSIS — H1133 Conjunctival hemorrhage, bilateral: Secondary | ICD-10-CM | POA: Diagnosis present

## 2022-03-24 DIAGNOSIS — R29706 NIHSS score 6: Secondary | ICD-10-CM | POA: Diagnosis present

## 2022-03-24 DIAGNOSIS — Z91148 Patient's other noncompliance with medication regimen for other reason: Secondary | ICD-10-CM | POA: Diagnosis not present

## 2022-03-24 DIAGNOSIS — G8194 Hemiplegia, unspecified affecting left nondominant side: Secondary | ICD-10-CM | POA: Diagnosis present

## 2022-03-24 DIAGNOSIS — R414 Neurologic neglect syndrome: Secondary | ICD-10-CM | POA: Diagnosis present

## 2022-03-24 DIAGNOSIS — R4781 Slurred speech: Secondary | ICD-10-CM | POA: Diagnosis present

## 2022-03-24 HISTORY — PX: IR CT HEAD LTD: IMG2386

## 2022-03-24 HISTORY — PX: IR PERCUTANEOUS ART THROMBECTOMY/INFUSION INTRACRANIAL INC DIAG ANGIO: IMG6087

## 2022-03-24 HISTORY — PX: IR US GUIDE VASC ACCESS RIGHT: IMG2390

## 2022-03-24 HISTORY — PX: RADIOLOGY WITH ANESTHESIA: SHX6223

## 2022-03-24 LAB — ECHOCARDIOGRAM COMPLETE
Area-P 1/2: 4.8 cm2
Height: 70.5 in
S' Lateral: 2.9 cm

## 2022-03-24 LAB — CBC
HCT: 43.5 % (ref 39.0–52.0)
Hemoglobin: 14.7 g/dL (ref 13.0–17.0)
MCH: 29.9 pg (ref 26.0–34.0)
MCHC: 33.8 g/dL (ref 30.0–36.0)
MCV: 88.6 fL (ref 80.0–100.0)
Platelets: 178 10*3/uL (ref 150–400)
RBC: 4.91 MIL/uL (ref 4.22–5.81)
RDW: 14.6 % (ref 11.5–15.5)
WBC: 7 10*3/uL (ref 4.0–10.5)
nRBC: 0 % (ref 0.0–0.2)

## 2022-03-24 LAB — COMPREHENSIVE METABOLIC PANEL
ALT: 15 U/L (ref 0–44)
AST: 23 U/L (ref 15–41)
Albumin: 3.5 g/dL (ref 3.5–5.0)
Alkaline Phosphatase: 75 U/L (ref 38–126)
Anion gap: 10 (ref 5–15)
BUN: 16 mg/dL (ref 6–20)
CO2: 25 mmol/L (ref 22–32)
Calcium: 9 mg/dL (ref 8.9–10.3)
Chloride: 106 mmol/L (ref 98–111)
Creatinine, Ser: 1.21 mg/dL (ref 0.61–1.24)
GFR, Estimated: >60 mL/min (ref >60)
Glucose, Bld: 140 mg/dL — ABNORMAL HIGH (ref 70–99)
Potassium: 3.3 mmol/L — ABNORMAL LOW (ref 3.5–5.1)
Sodium: 141 mmol/L (ref 135–145)
Total Bilirubin: 0.4 mg/dL (ref 0.3–1.2)
Total Protein: 7.6 g/dL (ref 6.5–8.1)

## 2022-03-24 LAB — BASIC METABOLIC PANEL
Anion gap: 11 (ref 5–15)
BUN: 11 mg/dL (ref 6–20)
CO2: 24 mmol/L (ref 22–32)
Calcium: 8.4 mg/dL — ABNORMAL LOW (ref 8.9–10.3)
Chloride: 106 mmol/L (ref 98–111)
Creatinine, Ser: 1.09 mg/dL (ref 0.61–1.24)
GFR, Estimated: >60 mL/min (ref >60)
Glucose, Bld: 89 mg/dL (ref 70–99)
Potassium: 3.6 mmol/L (ref 3.5–5.1)
Sodium: 141 mmol/L (ref 135–145)

## 2022-03-24 LAB — LIPID PANEL
Cholesterol: 124 mg/dL (ref 0–200)
HDL: 34 mg/dL — ABNORMAL LOW (ref >40)
LDL Cholesterol: 82 mg/dL (ref 0–99)
Total CHOL/HDL Ratio: 3.6 RATIO
Triglycerides: 40 mg/dL (ref <150)
VLDL: 8 mg/dL (ref 0–40)

## 2022-03-24 LAB — MRSA NEXT GEN BY PCR, NASAL: MRSA by PCR Next Gen: NOT DETECTED

## 2022-03-24 LAB — HEMOGLOBIN A1C
Hgb A1c MFr Bld: 5.8 % — ABNORMAL HIGH (ref 4.8–5.6)
Mean Plasma Glucose: 119.76 mg/dL

## 2022-03-24 LAB — HIV ANTIBODY (ROUTINE TESTING W REFLEX): HIV Screen 4th Generation wRfx: NONREACTIVE

## 2022-03-24 LAB — ETHANOL: Alcohol, Ethyl (B): <10 mg/dL (ref <10)

## 2022-03-24 SURGERY — IR WITH ANESTHESIA
Anesthesia: General

## 2022-03-24 MED ORDER — STROKE: EARLY STAGES OF RECOVERY BOOK
Freq: Once | Status: AC
  Filled 2022-03-24: qty 1

## 2022-03-24 MED ORDER — SUGAMMADEX SODIUM 200 MG/2ML IV SOLN
INTRAVENOUS | Status: DC | PRN
  Administered 2022-03-24 (×2): 100 mg via INTRAVENOUS

## 2022-03-24 MED ORDER — PROPOFOL 10 MG/ML IV BOLUS
INTRAVENOUS | Status: DC | PRN
  Administered 2022-03-24: 200 mg via INTRAVENOUS

## 2022-03-24 MED ORDER — FENTANYL CITRATE (PF) 250 MCG/5ML IJ SOLN
INTRAMUSCULAR | Status: DC | PRN
  Administered 2022-03-24: 100 ug via INTRAVENOUS

## 2022-03-24 MED ORDER — LIDOCAINE 2% (20 MG/ML) 5 ML SYRINGE
INTRAMUSCULAR | Status: DC | PRN
  Administered 2022-03-24: 60 mg via INTRAVENOUS

## 2022-03-24 MED ORDER — ATORVASTATIN CALCIUM 10 MG PO TABS
20.0000 mg | ORAL_TABLET | Freq: Every day | ORAL | Status: DC
  Administered 2022-03-24 – 2022-03-25 (×2): 20 mg via ORAL
  Filled 2022-03-24 (×2): qty 2

## 2022-03-24 MED ORDER — ACETAMINOPHEN 325 MG PO TABS
650.0000 mg | ORAL_TABLET | ORAL | Status: DC | PRN
  Administered 2022-03-24: 650 mg via ORAL

## 2022-03-24 MED ORDER — ROCURONIUM BROMIDE 10 MG/ML (PF) SYRINGE
PREFILLED_SYRINGE | INTRAVENOUS | Status: DC | PRN
  Administered 2022-03-24: 50 mg via INTRAVENOUS

## 2022-03-24 MED ORDER — PANTOPRAZOLE SODIUM 40 MG PO TBEC
40.0000 mg | DELAYED_RELEASE_TABLET | Freq: Every day | ORAL | Status: DC
  Administered 2022-03-24 – 2022-03-25 (×2): 40 mg via ORAL
  Filled 2022-03-24 (×2): qty 1

## 2022-03-24 MED ORDER — ACETAMINOPHEN 160 MG/5ML PO SOLN: 650.0000 mg | ORAL | Status: DC | PRN

## 2022-03-24 MED ORDER — IOHEXOL 300 MG/ML  SOLN
100.0000 mL | Freq: Once | INTRAMUSCULAR | Status: AC | PRN
  Administered 2022-03-24: 50 mL via INTRA_ARTERIAL

## 2022-03-24 MED ORDER — POTASSIUM CHLORIDE CRYS ER 20 MEQ PO TBCR: 20.0000 meq | EXTENDED_RELEASE_TABLET | Freq: Once | ORAL | Status: DC

## 2022-03-24 MED ORDER — SODIUM CHLORIDE 0.9 % IV SOLN: INTRAVENOUS | Status: DC

## 2022-03-24 MED ORDER — ACETAMINOPHEN 650 MG RE SUPP: 650.0000 mg | RECTAL | Status: DC | PRN

## 2022-03-24 MED ORDER — VERAPAMIL HCL 2.5 MG/ML IV SOLN
INTRAVENOUS | Status: AC
  Administered 2022-03-24: 5 mg via INTRA_ARTERIAL
  Filled 2022-03-24: qty 2

## 2022-03-24 MED ORDER — ACETAMINOPHEN 325 MG PO TABS
650.0000 mg | ORAL_TABLET | ORAL | Status: DC | PRN
  Filled 2022-03-24: qty 2

## 2022-03-24 MED ORDER — LACTATED RINGERS IV SOLN: INTRAVENOUS | Status: DC | PRN

## 2022-03-24 MED ORDER — CLEVIDIPINE BUTYRATE 0.5 MG/ML IV EMUL: 0.0000 mg/h | INTRAVENOUS | Status: DC

## 2022-03-24 MED ORDER — EPTIFIBATIDE 20 MG/10ML IV SOLN
INTRAVENOUS | Status: AC
  Filled 2022-03-24: qty 10

## 2022-03-24 MED ORDER — PERFLUTREN LIPID MICROSPHERE
1.0000 mL | INTRAVENOUS | Status: AC | PRN
  Administered 2022-03-24: 3 mL via INTRAVENOUS

## 2022-03-24 MED ORDER — PANTOPRAZOLE SODIUM 40 MG IV SOLR
40.0000 mg | Freq: Every day | INTRAVENOUS | Status: DC
  Administered 2022-03-24: 40 mg via INTRAVENOUS
  Filled 2022-03-24: qty 10

## 2022-03-24 MED ORDER — FENTANYL CITRATE (PF) 100 MCG/2ML IJ SOLN
INTRAMUSCULAR | Status: AC
  Filled 2022-03-24: qty 2

## 2022-03-24 MED ORDER — NICARDIPINE HCL IN NACL 20-0.86 MG/200ML-% IV SOLN: 0.0000 mg/h | INTRAVENOUS | Status: DC | PRN

## 2022-03-24 MED ORDER — CHLORHEXIDINE GLUCONATE CLOTH 2 % EX PADS
6.0000 | MEDICATED_PAD | Freq: Every day | CUTANEOUS | Status: DC
  Administered 2022-03-24: 6 via TOPICAL

## 2022-03-24 MED ORDER — PHENYLEPHRINE 80 MCG/ML (10ML) SYRINGE FOR IV PUSH (FOR BLOOD PRESSURE SUPPORT)
PREFILLED_SYRINGE | INTRAVENOUS | Status: DC | PRN
  Administered 2022-03-24 (×2): 80 ug via INTRAVENOUS

## 2022-03-24 MED ORDER — CANGRELOR TETRASODIUM 50 MG IV SOLR
INTRAVENOUS | Status: AC
  Filled 2022-03-24: qty 50

## 2022-03-24 MED ORDER — ASPIRIN 81 MG PO CHEW
CHEWABLE_TABLET | ORAL | Status: AC
  Filled 2022-03-24: qty 1

## 2022-03-24 MED ORDER — LORAZEPAM 2 MG/ML IJ SOLN
INTRAMUSCULAR | Status: AC
  Administered 2022-03-24: 1 mg
  Filled 2022-03-24: qty 1

## 2022-03-24 MED ORDER — LABETALOL HCL 5 MG/ML IV SOLN: 10.0000 mg | Freq: Once | INTRAVENOUS | Status: DC | PRN

## 2022-03-24 MED ORDER — ONDANSETRON HCL 4 MG/2ML IJ SOLN
INTRAMUSCULAR | Status: DC | PRN
  Administered 2022-03-24: 4 mg via INTRAVENOUS

## 2022-03-24 MED ORDER — SUCCINYLCHOLINE CHLORIDE 200 MG/10ML IV SOSY
PREFILLED_SYRINGE | INTRAVENOUS | Status: DC | PRN
  Administered 2022-03-24: 150 mg via INTRAVENOUS

## 2022-03-24 NOTE — Progress Notes (Signed)
*  PRELIMINARY RESULTS* Echocardiogram 2D Echocardiogram has been performed with Definity.  Samuel Germany 03/24/2022, 1:51 PM

## 2022-03-24 NOTE — Progress Notes (Signed)
MD notified about SBP being greater than 140. Kirkpatrick MD told this RN that SBP goal is 130-150.

## 2022-03-24 NOTE — H&P (Signed)
Neurology H&P  CC: Left-sided weakness  History is obtained from: Patient, wife  HPI: Brian Moody. is a 56 y.o. male with history of atrial fibrillation who gets his medications delivered through a mail order service.  Apparently there was some sort of delay and he received his Eliquis late, it arrived today but he had not yet taken a dose.  He was normal until 1045 at which point he felt dizzy and fell.  When he was unable to get up, he became concerned and was brought in emergently as a code stroke.   CT head was negative, and given that he had not had Eliquis in some time, I discussed risks and benefits and alternatives of tenecteplase with the patient who agreed with proceeding.  When I discussed the possibility of thrombectomy once an LVO was seen, he initially refused stating that he wanted to talk to his wife.  Unfortunately we do not have her number in the system, and so it was not until she arrived to the hospital that he was able to discuss things with her.  Even after she arrived, he was reticent to make a decision, resulting in significant delay.  LKW: 2245 TNK given?:  Yes IR Thrombectomy?  Yes Modified Rankin Scale: 0-Completely asymptomatic and back to baseline post- stroke NIHSS: 6  Past Medical History:  Diagnosis Date   Avulsion fracture of distal fibula 06/02/2013   Right distal fibula occurred 04/30/2013    Dysuria 08/14/2020   Hordeolum externum left upper eyelid 06/29/2019   Hypertension    Persistent atrial fibrillation ([place])    a. newly diagnosed on 05/2015 admission. started on coumadin.    Pre-diabetes    a. HgA1c 6.3 05/2015   Tobacco abuse      Family History  Problem Relation Age of Onset   Hypertension Father    Diabetes Maternal Grandmother    Hypertension Maternal Grandmother    Diabetes Maternal Grandfather    Hypertension Maternal Grandfather      Social History:  reports that he has been smoking cigarettes. He has been smoking an  average of .1 packs per day. He has never been exposed to tobacco smoke. He has never used smokeless tobacco. He reports that he does not drink alcohol and does not use drugs.   Prior to Admission medications   Medication Sig Start Date End Date Taking? Authorizing Provider  atorvastatin (LIPITOR) 10 MG tablet Take 1 tablet by mouth every day 03/06/22   Elsie Stain, MD  acetaminophen (TYLENOL) 500 MG tablet Take 2 tablets (1,000 mg total) by mouth every 6 (six) hours as needed for headache. 05/05/20   Ladell Pier, MD  albuterol (VENTOLIN HFA) 108 (90 Base) MCG/ACT inhaler Inhale 1-2 puffs into the lungs every 6 (six) hours as needed for wheezing. Patient not taking: Reported on 01/09/2022 07/02/21   Elsie Stain, MD  amLODipine (NORVASC) 10 MG tablet Take 0.5 tablets (5 mg total) by mouth daily. 01/09/22   Mayers, Cari S, PA-C  bisoprolol (ZEBETA) 10 MG tablet Take 1 tablet by mouth every day 01/09/22   Mayers, Cari S, PA-C  blood glucose meter kit and supplies KIT Dispense based on patient and insurance preference. Use up to four times daily as directed. (FOR ICD-9 250.00, 250.01). Patient not taking: Reported on 01/09/2022 06/29/19   Elsie Stain, MD  Blood Pressure Monitoring (5 SERIES BP MONITOR) DEVI 1 Units by Does not apply route daily. Measure blood pressure daily 06/29/19  Elsie Stain, MD  COVID-19 mRNA bivalent vaccine, Moderna, (MODERNA COVID-19 BIVAL BOOSTER) 50 MCG/0.5ML injection Inject into the muscle. 01/14/22   Carlyle Basques, MD  cyclobenzaprine (FLEXERIL) 5 MG tablet Take 1 tablet (5 mg total) by mouth 3 (three) times daily as needed for muscle spasms. 01/09/22   Mayers, Cari S, PA-C  ELIQUIS 5 MG TABS tablet Take 1 tablet by mouth twice daily. Appt needed 08/15/21   Elsie Stain, MD  metFORMIN (GLUCOPHAGE) 500 MG tablet Take 1 tablet (500 mg total) by mouth daily with breakfast. 01/09/22   Mayers, Cari S, PA-C  sildenafil (VIAGRA) 100 MG tablet Take 1 tablet  by mouth every day as needed for erectile dysfunction Patient not taking: Reported on 01/09/2022 09/05/21   Elsie Stain, MD  Vitamin D, Ergocalciferol, (DRISDOL) 1.25 MG (50000 UNIT) CAPS capsule Take 1 capsule (50,000 Units total) by mouth every 7 (seven) days. 01/14/22   Mayers, Cari S, PA-C  carvedilol (COREG) 12.5 MG tablet Take 1 tablet (12.5 mg total) by mouth 2 (two) times daily with a meal. 03/02/20 08/14/20  Elsie Stain, MD     Exam: Current vital signs: BP (!) 155/89   Pulse (!) 116   Temp 98 F (36.7 C)   Resp 16   Ht 5' 10.5" (1.791 m)   SpO2 96%   BMI 35.93 kg/m    Physical Exam  Constitutional: Appears well-developed and well-nourished.  Psych: Affect appropriate to situation Eyes: No scleral injection HENT: No OP obstrucion Head: Normocephalic.  Cardiovascular: Normal rate and regular rhythm.  Respiratory: Effort normal and breath sounds normal to anterior ascultation GI: Soft.  No distension. There is no tenderness.  Skin: WDI  Neuro: Mental Status: Patient is awake, alert, oriented to person, place, month, year, and situation. Patient is able to give a clear and coherent history. No signs of aphasia  He has sensory but not visual neglect Cranial Nerves: II: Visual Fields are full. Pupils are equal, round, and reactive to light.   III,IV, VI: EOMI without ptosis or diploplia.  V: Facial sensation is diminished in the left VII: Facial movement with left-sided weakness Motor: He has mild left arm and leg weakness, intact on the right Sensory: Sensation is diminished on the left, he has significant left sensory neglect, extinguishing all double simultaneous stimulation Cerebellar: Fine motor movements are impaired more than would be expected for weakness, but no past-pointing  I have reviewed labs in epic and the pertinent results are: Creatinine 1.2, potassium 3.3   I have reviewed the images obtained: CT/CTA-right M2 occlusion  Primary  Diagnosis:  Cerebral infarction due to embolism of  right middle cerebral artery.   Secondary Diagnosis: Essential (primary) hypertension and Paroxysmal atrial fibrillation Diabetes  Impression: 56 year old male with a history of atrial fibrillation not on anticoagulation due to logistics who presents with acute embolization to a right and to branch.  He was treated with IV tenecteplase, after discussion of the risks benefits and alternatives, and then proceeded to thrombectomy.  There was significant delay due to patient's initial reticence to undergo procedure, I suspect there was some degree of anosagnosia playing a role, but given his symptoms were on the milder side, and the patient wanted to talk to his wife, we awaited her arrival and discussion with her.  Plan: - HgbA1c, fasting lipid panel - MRI of the brain without contrast - Frequent neuro checks - Echocardiogram - Prophylactic therapy-Antiplatelet med: Aspirin - dose 27m and plavix 761m  daily  after 395m load  - Risk factor modification - Telemetry monitoring - PT consult, OT consult, Speech consult - Stroke team to follow    This patient is critically ill and at significant risk of neurological worsening, death and care requires constant monitoring of vital signs, hemodynamics,respiratory and cardiac monitoring, neurological assessment, discussion with family, other specialists and medical decision making of high complexity. I spent 65 minutes of neurocritical care time  in the care of  this patient. This was time spent independent of any time provided by nurse practitioner or PA.  MRoland Rack MD [facility] [phone] If 7pm- 7am, please page neurology on call as listed in [place]

## 2022-03-24 NOTE — Progress Notes (Signed)
PT Cancellation Note  Patient Details Name: Brian Moody. MRN: [id number] DOB: 07/24/1965   Cancelled Treatment:    Reason Eval/Treat Not Completed: Active bedrest order remains this morning s/p TNK at 2340 on 9/30. Will continue to follow and evaluate as appropriate.   [place], PT, DPT   Acute Rehabilitation Department   Sandra Cockayne 03/24/2022, 7:43 AM

## 2022-03-24 NOTE — Progress Notes (Signed)
[place] Progress Note Patient Name: Brian Moody. DOB: 01-May-1966 MRN: [id number]   Date of Service  03/24/2022  HPI/Events of Note  56/M wit history of afib, who presents with L sided weakness and slurred speech. CT angio showed obstruction of R MCA M2 and M3 branch. He underwent cerebral angiogram and underwent mechanical thrombectomy. He was then admitted to the ICU for close monitoring post procedure.   eICU Interventions  - Admit to ICU - q1 neurochecks per protocol. Will plan to perform head CT if with acute clinical deterioration.  - BP control - Maintain SBP 120-140 acutely.  - NPO until passes swallow evaluation - Maintain normoglycemia - Monitor electrolytes, correct abnormalities as warranted.  - Note of contrast extravasation vs. SAH. Will defer resumption of anticoagulation to Neuro IR.         [place] 03/24/2022, 2:19 AM

## 2022-03-24 NOTE — ED Notes (Signed)
Neurologist at bedside with patient's family.

## 2022-03-24 NOTE — ED Notes (Signed)
Patient to IR

## 2022-03-24 NOTE — Evaluation (Signed)
Physical Therapy Evaluation Patient Details Name: Brian Moody. MRN: [id number] DOB: Apr 10, 1966 Today's Date: 03/24/2022  History of Present Illness  Brian Moody. is a 56 y.o. male who presented to [facility] 9/30 with left sided weakness and slurred speech. CT head was negative. S/p thrombectomy 9/30. MRI pending. Pt with past medical hx of afib, HTN, HLD, obesity.   Clinical Impression  Pt in bed upon arrival of PT, agreeable to evaluation at this time. Prior to admission the pt was independent without need for assist, working full time as a Editor, commissioning, living with his spouse and 4 kids in a home with 2 steps to enter. The pt now presents with minor limitations in functional mobility and dynamic stability, but was able to complete all sit-stand transfers and hallway ambulation without need for UE support or assist, even with addition of balance challenge. He completed x10 stairs with single rail and x2 without UE support without issue, and reports his gait is "better than normal" because he is not as short of breath as he normally is. Will maintain on caseload acutely but is safe to return home without follow up therapies when medically stable for d/c.  Dynamic Gait Index (DGI): 22/24 (<19 indicates increased risk for falls)   Recommendations for follow up therapy are one component of a multi-disciplinary discharge planning process, led by the attending physician.  Recommendations may be updated based on patient status, additional functional criteria and insurance authorization.  Follow Up Recommendations No PT follow up      Assistance Recommended at Discharge Intermittent Supervision/Assistance  Patient can return home with the following       Equipment Recommendations Rolling walker (2 wheels)  Recommendations for Other Services       Functional Status Assessment Patient has had a recent decline in their functional status and demonstrates the ability to make significant  improvements in function in a reasonable and predictable amount of time.     Precautions / Restrictions Precautions Precautions: Fall Precaution Comments: low fall Restrictions Weight Bearing Restrictions: No      Mobility  Bed Mobility Overal bed mobility: Modified Independent             General bed mobility comments: no assist or increased time    Transfers Overall transfer level: Independent Equipment used: Rolling walker (2 wheels), None               General transfer comment: no need for assist, able to complete with and without RW    Ambulation/Gait Ambulation/Gait assistance: Supervision Gait Distance (Feet): 50 Feet (with RW + 150 with no AD) Assistive device: Rolling walker (2 wheels), None Gait Pattern/deviations: Step-through pattern, Decreased stride length Gait velocity: WFL Gait velocity interpretation: 1.31 - 2.62 ft/sec, indicative of limited community ambulator   General Gait Details: pt reports gait is actually better as he is not as out of breath walking today compared to normal. The pt had no overt LOB with RW or without and tolerated challenge well without need for assist  Stairs Stairs: Yes Stairs assistance: Supervision Stair Management: One rail Right, Alternating pattern, Forwards Number of Stairs: 10 General stair comments: 10 with single rail without LOB or need for assist then x2 without use of rail with step-to pattern and no assist  Wheelchair Mobility    Modified Rankin (Stroke Patients Only) Modified Rankin (Stroke Patients Only) Pre-Morbid Rankin Score: No symptoms Modified Rankin: Moderately severe disability     Balance Overall balance assessment:  No apparent balance deficits (not formally assessed)                               Standardized Balance Assessment Standardized Balance Assessment : Dynamic Gait Index   Dynamic Gait Index Level Surface: Normal Change in Gait Speed: Mild Impairment Gait  with Horizontal Head Turns: Normal Gait with Vertical Head Turns: Normal Gait and Pivot Turn: Normal Step Over Obstacle: Normal Step Around Obstacles: Normal Steps: Mild Impairment Total Score: 22       Pertinent Vitals/Pain Pain Assessment Pain Assessment: No/denies pain Pain Intervention(s): Monitored during session    Home Living Family/patient expects to be discharged to:: Private residence Living Arrangements: Spouse/significant other Available Help at Discharge: Available PRN/intermittently Type of Home: House Home Access: Stairs to enter Entrance Stairs-Rails: None Entrance Stairs-Number of Steps: 2   Home Layout: One level Home Equipment: None      Prior Function Prior Level of Function : Independent/Modified Independent;Driving;Working/employed             Mobility Comments: no AD 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 Follows all commands, joking, positive outlook.        General Comments General comments (skin integrity, edema, etc.): VSS on RA        Assessment/Plan    PT Assessment Patient needs continued PT services  PT Problem List Decreased balance;Decreased mobility       PT Treatment Interventions DME instruction;Gait training;Stair training;Functional mobility training;Therapeutic activities;Therapeutic exercise;Balance  training;Neuromuscular re-education;Patient/family education    PT Goals (Current goals can be found in the Care Plan section)  Acute Rehab PT Goals Patient Stated Goal: return home tomorrow PT Goal Formulation: With patient Time For Goal Achievement: 04/07/22 Potential to Achieve Goals: Good    Frequency Min 3X/week        AM-PAC PT "6 Clicks" Mobility  Outcome Measure Help needed turning from your back to your side while in a flat bed without using bedrails?: None Help needed moving from lying on your back to sitting on the side of a flat bed without using bedrails?: None Help needed moving to and from a bed to a chair (including a wheelchair)?: A Little Help needed standing up from a chair using your arms (e.g., wheelchair or bedside chair)?: A Little Help needed to walk in hospital room?: A Little Help needed climbing 3-5 steps with a railing? : A Little 6 Click Score: 20    End of Session Equipment Utilized During Treatment: Gait belt Activity Tolerance: Patient tolerated treatment well Patient left: in chair;with call bell/phone within reach;with nursing/sitter in room Nurse Communication: Mobility status PT Visit Diagnosis: Unsteadiness on feet (R26.81);Other abnormalities of gait and mobility (R26.89)    Time: 7782-4235 PT Time Calculation (min) (ACUTE ONLY):  13 min   Charges:   PT Evaluation $PT Eval Low Complexity: 1 Low          West Carbo, PT, DPT   Acute Rehabilitation Department  Sandra Cockayne 03/24/2022, 3:32 PM

## 2022-03-24 NOTE — ED Notes (Signed)
Decision made to call in IR per Neurologist, consent from patient obtained.

## 2022-03-24 NOTE — Code Documentation (Signed)
Stroke Response Nurse Documentation Code Documentation  Brian Moody. is a 56 y.o. male arriving to [facility]  via [facility] on 9/30 with past medical hx of afib, HTN, HLD, obesity. On clopidogrel 75 mg daily but has not taken Eliquis in about 2 weeks. Code stroke was activated by EMS.   Patient from home where he was LKW at 2100 and now complaining of left sided weakness and slurred speech.   Stroke team at the bedside on patient arrival. Labs drawn and patient cleared for CT by Dr. Ralene Bathe. Patient to CT with team. NIHSS 6, see documentation for details and code stroke times. Patient with left facial droop, left arm weakness, left leg weakness, left limb ataxia, left decreased sensation, dysarthria , and Sensory  neglect on exam. The following imaging was completed:  CT Head and CTA. Patient is a candidate for IV Thrombolytic due to fixed neurological deficit. Patient is a candidate for IR due to LVO.   Care Plan: Mechanical thrombectomy.   Bedside handoff with IR RN Estill Bamberg.    Madelynn Done   Rapid Response RN

## 2022-03-24 NOTE — Progress Notes (Addendum)
OT Cancellation Note  Patient Details Name: Brian Moody. MRN: [id number] DOB: 08/15/1965   Cancelled Treatment:    Reason Eval/Treat Not Completed: Active bedrest order (OT evaluation to f/u when activity orders progress) TNK given at 2340 on 9/30  Elliot Cousin 03/24/2022, 7:24 AM

## 2022-03-24 NOTE — Transfer of Care (Signed)
Immediate Anesthesia Transfer of Care Note  Patient: Brian Moody.  Procedure(s) Performed: IR WITH ANESTHESIA  Patient Location: PACU and ICU  Anesthesia Type:General  Level of Consciousness: awake, alert  and oriented  Airway & Oxygen Therapy: Patient Spontanous Breathing and Patient connected to face mask oxygen  Post-op Assessment: Report given to RN and Post -op Vital signs reviewed and stable  Post vital signs: Reviewed and stable  Last Vitals:  Vitals Value Taken Time  BP    Temp 36.9 C 03/24/22 0214  Pulse 99 03/24/22 0215  Resp 17 03/24/22 0215  SpO2 98 % 03/24/22 0215  Vitals shown include unvalidated device data.  Last Pain:  Vitals:   03/24/22 0214  TempSrc: Oral  PainSc:          Complications: No notable events documented.

## 2022-03-24 NOTE — Anesthesia Preprocedure Evaluation (Signed)
Anesthesia Evaluation  Patient identified by MRN, date of birth, ID band Patient confused  Preop documentation limited or incomplete due to emergent nature of procedure.  Airway Mallampati: II   Neck ROM: full    Dental   Pulmonary Current Smoker,    breath sounds clear to auscultation       Cardiovascular hypertension, + dysrhythmias  Rhythm:regular Rate:Normal     Neuro/Psych Code stroke CVA    GI/Hepatic   Endo/Other  diabetes, Type 2Morbid obesity  Renal/GU      Musculoskeletal   Abdominal   Peds  Hematology   Anesthesia Other Findings   Reproductive/Obstetrics                             Anesthesia Physical Anesthesia Plan  ASA: 3 and emergent  Anesthesia Plan: General   Post-op Pain Management:    Induction: Intravenous, Rapid sequence and Cricoid pressure planned  PONV Risk Score and Plan: 1 and Ondansetron, Dexamethasone, Midazolam and Treatment may vary due to age or medical condition  Airway Management Planned: Oral ETT  Additional Equipment:   Intra-op Plan:   Post-operative Plan: Extubation in OR  Informed Consent: I have reviewed the patients History and Physical, chart, labs and discussed the procedure including the risks, benefits and alternatives for the proposed anesthesia with the patient or authorized representative who has indicated his/her understanding and acceptance.       Plan Discussed with: CRNA, Anesthesiologist and Surgeon  Anesthesia Plan Comments:         Anesthesia Quick Evaluation

## 2022-03-24 NOTE — Procedures (Signed)
INTERVENTIONAL NEURORADIOLOGY BRIEF POSTPROCEDURE NOTE  DIAGNOSTIC CEREBRAL ANGIOGRAM AND MECHANICAL THROMBECTOMY  Attending: Dr. Pedro Earls  Diagnosis: Right M3 occlusion (x2)  Access site: RCFA  Access closure: Perclose Proglide  Anesthesia: GETA  Medication used: 5 mg Verapamil IV.  Complications: Trace contrast extravasation vs SAH.  Estimated blood loss: 50 mL  Specimen: None.  Findings: Occlusion of two M3/MCA branches noted. Mechanical thrombectomy performed with direct contact aspiration (x3) with complete recanalization (TICI 2C) with slow flow in an M4 posterior parietal branch.  The patient tolerated the procedure well without incident or complication and is in stable condition.   PLAN: - bed rest x6 hours - SBP 120-140 mmHg

## 2022-03-24 NOTE — Anesthesia Procedure Notes (Signed)
Procedure Name: Intubation Date/Time: 03/24/2022 12:36 AM  Performed by: Trinna Post., CRNAPre-anesthesia Checklist: Patient identified, Emergency Drugs available, Suction available, Patient being monitored and Timeout performed Patient Re-evaluated:Patient Re-evaluated prior to induction Oxygen Delivery Method: Circle system utilized Preoxygenation: Pre-oxygenation with 100% oxygen Induction Type: IV induction, Rapid sequence and Cricoid Pressure applied Laryngoscope Size: Mac and 4 Grade View: Grade I Tube type: Oral Tube size: 7.5 mm Number of attempts: 1 Airway Equipment and Method: Stylet Placement Confirmation: ETT inserted through vocal cords under direct vision, positive ETCO2 and breath sounds checked- equal and bilateral Secured at: 22 cm Tube secured with: Tape Dental Injury: Teeth and Oropharynx as per pre-operative assessment

## 2022-03-24 NOTE — Progress Notes (Addendum)
STROKE TEAM PROGRESS NOTE   INTERVAL HISTORY Patient is seen in his room with two family members at the bedside.  Yesterday, he experienced acute onset dizziness and left sided weakness with a fall.  He was given TNK (he had missed multiple doses of his Eliquis) and taken to IR for mechanical thrombectomy of the right MCA.  TICI 2c flow was achieved.  Vitals:   03/24/22 0930 03/24/22 1000 03/24/22 1100 03/24/22 1200  BP: (!) 138/95 (!) 144/98 (!) 147/82   Pulse: 92 91 94 86  Resp: 20 20 (!) 24 16  Temp:      TempSrc:      SpO2: 91% 91% 100% 97%  Height:       CBC:  Recent Labs  Lab 03/23/22 2315 03/23/22 2318 03/24/22 0835  WBC 8.1  --  7.0  NEUTROABS 4.2  --   --   HGB 14.7 15.0 14.7  HCT 44.0 44.0 43.5  MCV 89.6  --  88.6  PLT 214  --  0000000   Basic Metabolic Panel:  Recent Labs  Lab 03/23/22 2315 03/23/22 2318 03/24/22 0835  NA 141 141 141  K 3.3* 3.2* 3.6  CL 106 104 106  CO2 25  --  24  GLUCOSE 140* 135* 89  BUN 16 17 11   CREATININE 1.21 1.10 1.09  CALCIUM 9.0  --  8.4*   Lipid Panel:  Recent Labs  Lab 03/24/22 0249  CHOL 124  TRIG 40  HDL 34*  CHOLHDL 3.6  VLDL 8  LDLCALC 82   HgbA1c:  Recent Labs  Lab 03/24/22 0249  HGBA1C 5.8*   Urine Drug Screen: No results for input(s): "LABOPIA", "COCAINSCRNUR", "LABBENZ", "AMPHETMU", "THCU", "LABBARB" in the last 168 hours.  Alcohol Level  Recent Labs  Lab 03/23/22 2315  ETH <10    IMAGING past 24 hours CT ANGIO HEAD NECK W WO CM (CODE STROKE)  Result Date: 03/23/2022 CLINICAL DATA:  Left facial droop, slurred speech EXAM: CT ANGIOGRAPHY HEAD AND NECK TECHNIQUE: Multidetector CT imaging of the head and neck was performed using the standard protocol during bolus administration of intravenous contrast. Multiplanar CT image reconstructions and MIPs were obtained to evaluate the vascular anatomy. Carotid stenosis measurements (when applicable) are obtained utilizing NASCET criteria, using the distal  internal carotid diameter as the denominator. RADIATION DOSE REDUCTION: This exam was performed according to the departmental dose-optimization program which includes automated exposure control, adjustment of the mA and/or kV according to patient size and/or use of iterative reconstruction technique. CONTRAST:  75mL OMNIPAQUE IOHEXOL 350 MG/ML SOLN COMPARISON:  No prior CTA available, correlation is made with CT head 03/23/2022 FINDINGS: CT HEAD FINDINGS For noncontrast findings, please see same day CT head. CTA NECK FINDINGS Aortic arch: Two-vessel arch with a common origin of the brachiocephalic and left common carotid arteries. Imaged portion shows no evidence of aneurysm or dissection. No significant stenosis of the major arch vessel origins. Right carotid system: No evidence of dissection, occlusion, or hemodynamically significant stenosis (greater than 50%). Left carotid system: No evidence of dissection, occlusion, or hemodynamically significant stenosis (greater than 50%). Vertebral arteries: No evidence of dissection, occlusion, or hemodynamically significant stenosis (greater than 50%). Skeleton: No acute osseous abnormality. Other neck: No acute finding. Upper chest: No focal pulmonary opacity or pleural effusion. Review of the MIP images confirms the above findings CTA HEAD FINDINGS Anterior circulation: Both internal carotid arteries are patent to the termini, without significant stenosis. Patent left A1. Aplastic right A1.  Normal anterior communicating artery. Anterior cerebral arteries are patent to their distal aspects. No M1 stenosis or occlusion. A right M2 appears occluded, just proximal to the bifurcation (series 7, image [id number]), with nonopacification of the more inferior M3 branch and opacification of the more superior branch. MCA branches otherwise perfused and symmetric. Posterior circulation: Vertebral arteries patent to the vertebrobasilar junction without stenosis, although the left V4 is  quite diminutive after the PICA takeoff. Posterior inferior cerebellar arteries patent proximally. Basilar patent to its distal aspect. Superior cerebellar arteries patent proximally. Patent P1 segments, diminutive on the right. Near fetal origin of the right PCA from a prominent right posterior communicating artery. The left posterior communicating artery is also patent. PCAs perfused to their distal aspects without stenosis. Venous sinuses: As permitted by contrast timing, patent. Anatomic variants: Near fetal origin of the right PCA. Review of the MIP images confirms the above findings IMPRESSION: 1. Occlusion of a distal right M2, near the bifurcation, with nonopacification of the more inferior M3 branch and opacification of the more superior branch. 2. Otherwise no intracranial hemodynamically significant stenosis. 3.  No hemodynamically significant stenosis in the neck. These findings were discussed by telephone on 03/23/2022 at 11:46 pm with provider MCNEILL KIRKPATRICK . Electronically Signed   By: Merilyn Baba M.D.   On: 03/23/2022 23:55   CT HEAD CODE STROKE WO CONTRAST  Result Date: 03/23/2022 CLINICAL DATA:  Code stroke. Left-sided weakness, facial droop, slurred speech EXAM: CT HEAD WITHOUT CONTRAST TECHNIQUE: Contiguous axial images were obtained from the base of the skull through the vertex without intravenous contrast. RADIATION DOSE REDUCTION: This exam was performed according to the departmental dose-optimization program which includes automated exposure control, adjustment of the mA and/or kV according to patient size and/or use of iterative reconstruction technique. COMPARISON:  None Available. FINDINGS: Brain: No evidence of acute infarction, hemorrhage, cerebral edema, mass, mass effect, or midline shift. No hydrocephalus or extra-axial collection. Vascular: No hyperdense vessel. Skull: Negative for fracture or focal lesion. Sinuses/Orbits: No acute finding. Other: The mastoid air cells are  well aerated. ASPECTS [facility] Stroke Program Early CT Score) - Ganglionic level infarction (caudate, lentiform nuclei, internal capsule, insula, M1-M3 cortex): 7 - Supraganglionic infarction (M4-M6 cortex): 3 Total score (0-10 with 10 being normal): 10 IMPRESSION: No evidence of acute intracranial process. ASPECTS is 10. Code stroke imaging results were communicated on 03/23/2022 at 11:33 pm to provider Dr. Leonel Ramsay via secure text paging. Electronically Signed   By: Merilyn Baba M.D.   On: 03/23/2022 23:33    PHYSICAL EXAM General:  Alert, well-nourished, well-developed patient in no acute distress  NEURO:  Mental Status: AA&Ox3  Speech/Language: speech is without dysarthria or aphasia.  Naming, repetition, fluency, and comprehension intact.  Cranial Nerves:  II: PERRL. Visual fields full.  III, IV, VI: EOMI. Eyelids elevate symmetrically.  V: Sensation is intact to light touch and symmetrical to face.  VII: Smile is symmetrical.   VIII: hearing intact to voice. IX, X: Phonation is normal.  XH:BZJIRCVE shrug 5/5. XII: tongue is midline without fasciculations. Motor: 5/5 strength to all muscle groups tested.  Tone: is normal and bulk is normal Sensation- Intact to light touch bilaterally.  Coordination: FTN intact bilaterally, HKS: no ataxia in BLE.No drift.  Gait- deferred   ASSESSMENT/PLAN [name]. is a 56 y.o. male with history of atrial fibrillation on Eliquis with patient having not taken this medication for about two weeks presenting with acute onset dizziness and left  sided weakness with a fall.  He was given TNK (he had missed multiple doses of his Eliquis) and taken to IR for mechanical thrombectomy of the right MCA.  TICI 2c flow was achieved.  Stroke:  right MCA stroke s/p TNK and IR with TICI2c, likely due to afib not compliant with eliquis Code Stroke CT head No acute abnormality. ASPECTS 10.    CTA head & neck occlusion of distal right M2 IR showed  right M3 occlusion x2, status post TICI2c reperfusion MRI  pending MRA pending 2D Echo EF 60 to 65% LDL 82 HgbA1c 5.8 VTE prophylaxis - SCDs Eliquis (apixaban) daily prior to admission (but patient not compliant with medication), now on No antithrombotic within 24 hours of TNK.  Therapy recommendations:  none Disposition:  pending  Hypertension Home meds:  amlodipine 10 mg daily, bisoprolol 10 mg daily Stable Keep SBP 120-140 for first 24 hours post IR Long-term BP goal normotensive  Atrial fibrillation Patient has persistent atrial fibrillation Was prescribed Eliquis but had not taken this medication for past two weeks and when he was taken he took only once a day Discussed importance of taking Eliquis as prescribed Will restart Eliquis once appropriate  Hyperlipidemia Home meds: Lipitor 10 LDL 82, goal < 70 On atorvastatin 20 mg daily  High intensity statin not indicated as LDL near goal Continue statin at discharge  Tobacco abuse Current smoker Smoking cessation counseling provided Pt is willing to quit  Other Stroke Risk Factors Obesity, Body mass index is 35.93 kg/m., BMI >/= 30 associated with increased stroke risk, recommend weight loss, diet and exercise as appropriate   Other Active Problems Bilateral eye subconjunctival hemorrhage post TNK  Hospital day # 0  [place] , MSN, AGACNP-BC [facility] See Amion for schedule and pager information 03/24/2022 1:05 PM   ATTENDING NOTE: I reviewed above note and agree with the assessment and plan. Pt was seen and examined.   56 year old male with history of A-fib not compliant with Eliquis, hypertension, smoker admitted for dizziness, fell at home with left-sided weakness and slurred speech.  CT no acute abnormality.  Status post TNK.  CTA head and neck showed right M2 occlusion.  Status post IR with TICI2c reperfusion.  MRI and MRA pending.  EF 60 to 65%.  LDL 82, A1c 5.8, creatinine  1.10.  On exam, patient wife and son are at bedside.  Patient lying in bed, AOx3, mild dysarthria, but no aphasia, follows simple commands, able to name and repeat.  Bilateral eye subconjunctival hemorrhage post TNK.  Visual field full, no gaze palsy, facial symmetrical.  Bilateral upper and and lower extremities symmetrical muscle strength, sensation symmetrical, finger-to-nose intact bilaterally.  Etiology for patient stroke likely due to A-fib not compliant with medication.  Currently within 24 hours post TNK, will need to restart Eliquis once appropriate.  Increase Lipitor 10-20.  Patient passed swallow, on diet.  Educated on medication compliance and smoking cessation.  PT/OT no recommendation.  For detailed assessment and plan, please refer to above/below as I have made changes wherever appropriate.   I had long discussion with wife and the patient at bedside, updated pt current condition, treatment plan and potential prognosis, and answered all the questions.  They expressed understanding and appreciation.    Brian Hawking, MD PhD Stroke Neurology 03/24/2022 10:22 PM  This patient is critically ill due to stroke status post TNK and IR, A-fib not compliant with medication and at significant risk of neurological  worsening, death form recurrent stroke, hemorrhagic transformation, bleeding from TNK, heart failure. This patient's care requires constant monitoring of vital signs, hemodynamics, respiratory and cardiac monitoring, review of multiple databases, neurological assessment, discussion with family, other specialists and medical decision making of high complexity. I spent 40 minutes of neurocritical care time in the care of this patient.    To contact Stroke Continuity provider, please refer to [email or web address] After hours, contact General Neurology

## 2022-03-24 NOTE — Evaluation (Signed)
Occupational Therapy Evaluation Patient Details Name: Brian Moody. MRN: [id number] DOB: 12-14-1965 Today's Date: 03/24/2022   History of Present Illness Brian Moody. is a 56 y.o. male who presented to [facility] with  left sided weakness and slurred speech. CT head was negative. S/p thrombectomy 9/30. MRI pending. Pt with past medical hx of afib, HTN, HLD, obesity.   Clinical Impression   Brian Moody was evaluated s/p the above admission list, he is indep at baseline. Upon evaluation pt was indep for all mobility and ADLs. Denies paraesthesias, no sensory motor deficits and baseline cognition and speech. No further OT needs identified. Recommend dd/c home with family.      Recommendations for follow up therapy are one component of a multi-disciplinary discharge planning process, led by the attending physician.  Recommendations may be updated based on patient status, additional functional criteria and insurance authorization.   Follow Up Recommendations  No OT follow up    Assistance Recommended at Discharge PRN  Patient can return home with the following Assist for transportation    Functional Status Assessment  Patient has had a recent decline in their functional status and demonstrates the ability to make significant improvements in function in a reasonable and predictable amount of time.  Equipment Recommendations  None recommended by OT       Precautions / Restrictions Precautions Precautions: Fall (low) Restrictions Weight Bearing Restrictions: No      Mobility Bed Mobility Overal bed mobility: Modified Independent                  Transfers Overall transfer level: Independent                        Balance Overall balance assessment: No apparent balance deficits (not formally assessed)                                         ADL either performed or assessed with clinical judgement   ADL Overall ADL's : Independent                                      Functional mobility during ADLs: Independent General ADL Comments: Supervision provided throughout however pt demonstrated indep ability to complete ADLs     Vision Baseline Vision/History: 0 No visual deficits Vision Assessment?: No apparent visual deficits Additional Comments: bloodshot bilat eyes     Perception     Praxis      Pertinent Vitals/Pain Pain Assessment Pain Assessment: 0-10 Pain Score: 1  Pain Location: groin site Pain Descriptors / Indicators: Discomfort Pain Intervention(s): Limited activity within patient's tolerance, Monitored during session     Hand Dominance Right   Extremity/Trunk Assessment Upper Extremity Assessment Upper Extremity Assessment: Overall WFL for tasks assessed   Lower Extremity Assessment Lower Extremity Assessment: Defer to PT evaluation   Cervical / Trunk Assessment Cervical / Trunk Assessment: Normal   Communication Communication Communication: No difficulties   Cognition Arousal/Alertness: Awake/alert Behavior During Therapy: WFL for tasks assessed/performed Overall Cognitive Status: Within Functional Limits for tasks assessed                                 General Comments: Overall WFL. Follows all  commands, joking, positive outlook.     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: Impaired UE functional use (resolved)      OT Treatment/Interventions:  (resolved)    OT Goals(Current goals can be found in the care plan section) Acute Rehab OT Goals Patient Stated Goal: home OT Goal Formulation: With patient Time For Goal Achievement: 03/24/22 Potential to Achieve Goals: Good  OT Frequency:         AM-PAC OT "6 Clicks" Daily Activity     Outcome Measure Help from another person eating meals?: None Help from another person taking care of personal grooming?: None Help from another person toileting, which includes using toliet, bedpan, or urinal?: None Help from another person bathing (including washing, rinsing, drying)?: None Help from another person to put on and taking off regular upper body clothing?: None Help from another person to put on and taking off regular lower body clothing?: None 6 Click Score: 24   End of Session Nurse Communication: Mobility status  Activity Tolerance: Patient tolerated treatment well Patient left: in chair;with call bell/phone within reach;with family/visitor present  OT Visit Diagnosis: Unsteadiness on feet (R26.81);Other abnormalities of gait and mobility (R26.89);Muscle weakness (generalized) (M62.81)                Time: [id number] OT Time Calculation (min): 17 min Charges:  OT General Charges $OT Visit: 1 Visit OT Evaluation $OT Eval Moderate Complexity: 1 Mod   Nayquan Evinger D Causey 03/24/2022, 1:23 PM

## 2022-03-24 NOTE — Progress Notes (Addendum)
S/p Right M3 occlusion x2 with mechanical thrombectomy  Patient awake, alert, verbal, and in good spirits at time of exam. CNII-XII grossly intact. Patient with 5/5 strength in all extremities. Sensation intact in all extremities.  Site check today. Patient with mild tenderness at right common femoral artery puncture site, no hematoma visualized, no pseudoaneurysm identified. Distal pulse palpated.  Please contact IR team with any questions or concerns.

## 2022-03-25 ENCOUNTER — Other Ambulatory Visit (HOSPITAL_COMMUNITY): Payer: Self-pay

## 2022-03-25 ENCOUNTER — Encounter (HOSPITAL_COMMUNITY): Payer: Self-pay | Admitting: Radiology

## 2022-03-25 DIAGNOSIS — I639 Cerebral infarction, unspecified: Secondary | ICD-10-CM | POA: Diagnosis not present

## 2022-03-25 DIAGNOSIS — I482 Chronic atrial fibrillation, unspecified: Secondary | ICD-10-CM

## 2022-03-25 DIAGNOSIS — E1165 Type 2 diabetes mellitus with hyperglycemia: Secondary | ICD-10-CM | POA: Diagnosis not present

## 2022-03-25 DIAGNOSIS — H1133 Conjunctival hemorrhage, bilateral: Secondary | ICD-10-CM

## 2022-03-25 DIAGNOSIS — I63411 Cerebral infarction due to embolism of right middle cerebral artery: Secondary | ICD-10-CM | POA: Diagnosis not present

## 2022-03-25 DIAGNOSIS — I10 Essential (primary) hypertension: Secondary | ICD-10-CM

## 2022-03-25 LAB — CBC
HCT: 40.6 % (ref 39.0–52.0)
Hemoglobin: 13.7 g/dL (ref 13.0–17.0)
MCH: 30 pg (ref 26.0–34.0)
MCHC: 33.7 g/dL (ref 30.0–36.0)
MCV: 89 fL (ref 80.0–100.0)
Platelets: 185 10*3/uL (ref 150–400)
RBC: 4.56 MIL/uL (ref 4.22–5.81)
RDW: 14.5 % (ref 11.5–15.5)
WBC: 7.6 10*3/uL (ref 4.0–10.5)
nRBC: 0 % (ref 0.0–0.2)

## 2022-03-25 LAB — BASIC METABOLIC PANEL
Anion gap: 11 (ref 5–15)
BUN: 8 mg/dL (ref 6–20)
CO2: 26 mmol/L (ref 22–32)
Calcium: 8.5 mg/dL — ABNORMAL LOW (ref 8.9–10.3)
Chloride: 103 mmol/L (ref 98–111)
Creatinine, Ser: 1.13 mg/dL (ref 0.61–1.24)
GFR, Estimated: >60 mL/min (ref >60)
Glucose, Bld: 120 mg/dL — ABNORMAL HIGH (ref 70–99)
Potassium: 3.4 mmol/L — ABNORMAL LOW (ref 3.5–5.1)
Sodium: 140 mmol/L (ref 135–145)

## 2022-03-25 MED ORDER — ASPIRIN 325 MG PO TBEC
325.0000 mg | DELAYED_RELEASE_TABLET | Freq: Once | ORAL | Status: AC
  Administered 2022-03-25: 325 mg via ORAL
  Filled 2022-03-25: qty 1

## 2022-03-25 MED ORDER — BISOPROLOL FUMARATE 10 MG PO TABS
10.0000 mg | ORAL_TABLET | Freq: Every day | ORAL | 0 refills | Status: DC
  Filled 2022-03-25: qty 30, 30d supply, fill #0

## 2022-03-25 MED ORDER — POLYVINYL ALCOHOL 1.4 % OP SOLN
1.0000 [drp] | OPHTHALMIC | Status: DC | PRN
  Filled 2022-03-25: qty 15

## 2022-03-25 MED ORDER — AMLODIPINE BESYLATE 10 MG PO TABS
10.0000 mg | ORAL_TABLET | Freq: Every day | ORAL | Status: DC
  Administered 2022-03-25: 10 mg via ORAL
  Filled 2022-03-25: qty 1

## 2022-03-25 MED ORDER — AMLODIPINE BESYLATE 10 MG PO TABS
10.0000 mg | ORAL_TABLET | Freq: Every day | ORAL | 0 refills | Status: DC
  Filled 2022-03-25: qty 30, 30d supply, fill #0

## 2022-03-25 MED ORDER — BISOPROLOL FUMARATE 10 MG PO TABS
10.0000 mg | ORAL_TABLET | Freq: Every day | ORAL | Status: DC
  Administered 2022-03-25: 10 mg via ORAL
  Filled 2022-03-25: qty 1

## 2022-03-25 MED ORDER — POLYVINYL ALCOHOL 1.4 % OP SOLN
1.0000 [drp] | OPHTHALMIC | 0 refills | Status: AC | PRN
  Filled 2022-03-25: qty 15, fill #0

## 2022-03-25 MED ORDER — ASPIRIN 325 MG PO TABS: 325.0000 mg | ORAL_TABLET | Freq: Every day | ORAL | Status: DC

## 2022-03-25 MED ORDER — ATORVASTATIN CALCIUM 20 MG PO TABS
20.0000 mg | ORAL_TABLET | Freq: Every day | ORAL | 0 refills | Status: DC
  Filled 2022-03-25: qty 30, 30d supply, fill #0

## 2022-03-25 NOTE — Progress Notes (Signed)
Physical Therapy Treatment Patient Details Name: Brian Moody. MRN: [id number] DOB: 09/10/1965 Today's Date: 03/25/2022   History of Present Illness Brian Hang. is a 56 y.o. male who presented to [facility] 9/30 with left sided weakness and slurred speech. CT head was negative. S/p thrombectomy 9/30. MRI pending. Pt with past medical hx of afib, HTN, HLD, obesity.    PT Comments    The pt continues to demo good progress with activity tolerance and dynamic stability. No overt LOB with ambulation, even without UE support or with addition of balance challenge. The pt did not need UE support with any challenge, but does demo poor static balance with narrow BOS or prolonged SLS. Pt educated on implications for mobility at home. Pt verbalized understanding, hopeful for return home today. Recommendations remain appropriate at this time.     Recommendations for follow up therapy are one component of a multi-disciplinary discharge planning process, led by the attending physician.  Recommendations may be updated based on patient status, additional functional criteria and insurance authorization.  Follow Up Recommendations  No PT follow up     Assistance Recommended at Discharge Intermittent Supervision/Assistance  Patient can return home with the following     Equipment Recommendations  Rolling walker (2 wheels)    Recommendations for Other Services       Precautions / Restrictions Precautions Precautions: Fall Precaution Comments: low fall Restrictions Weight Bearing Restrictions: No     Mobility  Bed Mobility Overal bed mobility: Modified Independent             General bed mobility comments: no assist or increased time    Transfers Overall transfer level: Independent Equipment used: None               General transfer comment: no need for assist, no instability    Ambulation/Gait Ambulation/Gait assistance: Supervision Gait Distance (Feet): 250  Feet Assistive device: None Gait Pattern/deviations: Step-through pattern, Decreased stride length Gait velocity: WFL     General Gait Details: mildly slowed gait, able to tolerate well without challenge and no LOB   Stairs             Wheelchair Mobility    Modified Rankin (Stroke Patients Only) Modified Rankin (Stroke Patients Only) Pre-Morbid Rankin Score: No symptoms Modified Rankin: Moderately severe disability     Balance Overall balance assessment: No apparent balance deficits (not formally assessed)         Standing balance support: No upper extremity supported, During functional activity Standing balance-Leahy Scale: Good   Single Leg Stance - Right Leg: 5 Single Leg Stance - Left Leg: 2 Tandem Stance - Right Leg: 8 Tandem Stance - Left Leg: 3 Rhomberg - Eyes Opened: 20 Rhomberg - Eyes Closed: 20     Standardized Balance Assessment Standardized Balance Assessment : Dynamic Gait Index   Dynamic Gait Index Level Surface: Normal Change in Gait Speed: Mild Impairment Gait with Horizontal Head Turns: Normal Gait with Vertical Head Turns: Normal Gait and Pivot Turn: Normal Step Over Obstacle: Normal Step Around Obstacles: Normal Steps: Mild Impairment Total Score: 22      Cognition Arousal/Alertness: Awake/alert Behavior During Therapy: WFL for tasks assessed/performed Overall Cognitive Status: Within Functional Limits for tasks assessed                                 General Comments: Overall WFL. Follows all commands, joking, positive outlook.  Exercises      General Comments General comments (skin integrity, edema, etc.): VSS on RA      Pertinent Vitals/Pain Pain Assessment Pain Assessment: No/denies pain Pain Intervention(s): Monitored during session    Home Living                          Prior Function            PT Goals (current goals can now be found in the care plan section) Acute Rehab  PT Goals Patient Stated Goal: return home today PT Goal Formulation: With patient Time For Goal Achievement: 04/07/22 Potential to Achieve Goals: Good Progress towards PT goals: Progressing toward goals    Frequency    Min 3X/week      PT Plan Current plan remains appropriate    Co-evaluation              AM-PAC PT "6 Clicks" Mobility   Outcome Measure  Help needed turning from your back to your side while in a flat bed without using bedrails?: None Help needed moving from lying on your back to sitting on the side of a flat bed without using bedrails?: None Help needed moving to and from a bed to a chair (including a wheelchair)?: None Help needed standing up from a chair using your arms (e.g., wheelchair or bedside chair)?: None Help needed to walk in hospital room?: A Little Help needed climbing 3-5 steps with a railing? : A Little 6 Click Score: 22    End of Session Equipment Utilized During Treatment: Gait belt Activity Tolerance: Patient tolerated treatment well Patient left: in bed;with bed alarm set Nurse Communication: Mobility status PT Visit Diagnosis: Unsteadiness on feet (R26.81);Other abnormalities of gait and mobility (R26.89)     Time: 5284-1324 PT Time Calculation (min) (ACUTE ONLY): 18 min  Charges:  $Neuromuscular Re-education: 8-22 mins                     [place], PT, DPT   Acute Rehabilitation Department   Sandra Cockayne 03/25/2022, 1:22 PM

## 2022-03-25 NOTE — Discharge Summary (Addendum)
Stroke Discharge Summary  Patient ID: Brian Moody   MRN: [id number]      DOB: November 28, 1965  Date of Admission: 03/23/2022 Date of Discharge: 03/25/2022  Attending Physician:  Stroke, Md, MD, Stroke MD Patient's PCP:  Elsie Stain, MD  DISCHARGE DIAGNOSIS:  Principal Problem:   Stroke:  right MCA stroke s/p TNK and IR with TICI2c, likely due to afib not compliant with eliquis  Active Problems:   hyperlipidemia   Essential hypertension   Tobacco use   Obesity (BMI 30-39.9)   Chronic atrial fibrillation (HCC)   Controlled type 2 diabetes mellitus with hyperglycemia   Non-traumatic subconjunctival hemorrhage, bilateral   Allergies as of 03/25/2022   No Known Allergies      Medication List     STOP taking these medications    albuterol 108 (90 Base) MCG/ACT inhaler Commonly known as: VENTOLIN HFA   Moderna COVID-19 Bivalent 50 MCG/0.5ML injection Generic drug: COVID-19 mRNA bivalent vaccine (Moderna)       TAKE these medications    5 Series BP Monitor Devi 1 Units by Does not apply route daily. Measure blood pressure daily   acetaminophen 500 MG tablet Commonly known as: TYLENOL Take 2 tablets (1,000 mg total) by mouth every 6 (six) hours as needed for headache.   amLODipine 10 MG tablet Commonly known as: NORVASC Take 1 tablet (10 mg total) by mouth daily. Start taking on: March 26, 2022   atorvastatin 20 MG tablet Commonly known as: LIPITOR Take 1 tablet (20 mg total) by mouth daily. Start taking on: March 26, 2022 What changed:  medication strength how much to take   bisoprolol 10 MG tablet Commonly known as: ZEBETA Take 1 tablet (10 mg total) by mouth daily. Start taking on: March 26, 2022   blood glucose meter kit and supplies Kit Dispense based on patient and insurance preference. Use up to four times daily as directed. (FOR ICD-9 250.00, 250.01).   cyclobenzaprine 5 MG tablet Commonly known as: FLEXERIL Take 1 tablet (5 mg  total) by mouth 3 (three) times daily as needed for muscle spasms.   Eliquis 5 MG Tabs tablet Generic drug: apixaban Take 1 tablet by mouth twice daily. Appt needed What changed: See the new instructions.   metFORMIN 500 MG tablet Commonly known as: GLUCOPHAGE Take 1 tablet (500 mg total) by mouth daily with breakfast.   polyvinyl alcohol 1.4 % ophthalmic solution Commonly known as: LIQUIFILM TEARS Place 1 drop into both eyes as needed for dry eyes.   Vitamin D (Ergocalciferol) 1.25 MG (50000 UNIT) Caps capsule Commonly known as: DRISDOL Take 1 capsule (50,000 Units total) by mouth every 7 (seven) days.        LABORATORY STUDIES CBC    Component Value Date/Time   WBC 7.6 03/25/2022 0259   RBC 4.56 03/25/2022 0259   HGB 13.7 03/25/2022 0259   HGB 16.2 08/14/2020 1519   HCT 40.6 03/25/2022 0259   HCT 49.2 08/14/2020 1519   PLT 185 03/25/2022 0259   PLT 244 08/14/2020 1519   MCV 89.0 03/25/2022 0259   MCV 90 08/14/2020 1519   MCH 30.0 03/25/2022 0259   MCHC 33.7 03/25/2022 0259   RDW 14.5 03/25/2022 0259   RDW 13.5 08/14/2020 1519   LYMPHSABS 2.9 03/23/2022 2315   LYMPHSABS 3.2 (H) 08/14/2020 1519   MONOABS 0.8 03/23/2022 2315   EOSABS 0.1 03/23/2022 2315   EOSABS 0.1 08/14/2020 1519   BASOSABS 0.0 03/23/2022  2315   BASOSABS 0.0 08/14/2020 1519   CMP    Component Value Date/Time   NA 140 03/25/2022 0259   NA 138 01/09/2022 1443   K 3.4 (L) 03/25/2022 0259   CL 103 03/25/2022 0259   CO2 26 03/25/2022 0259   GLUCOSE 120 (H) 03/25/2022 0259   BUN 8 03/25/2022 0259   BUN 11 01/09/2022 1443   CREATININE 1.13 03/25/2022 0259   CREATININE 1.15 12/04/2015 1731   CALCIUM 8.5 (L) 03/25/2022 0259   PROT 7.6 03/23/2022 2315   PROT 7.1 01/09/2022 1443   ALBUMIN 3.5 03/23/2022 2315   ALBUMIN 4.1 01/09/2022 1443   AST 23 03/23/2022 2315   ALT 15 03/23/2022 2315   ALKPHOS 75 03/23/2022 2315   BILITOT 0.4 03/23/2022 2315   BILITOT <0.2 01/09/2022 1443   GFRNONAA  >60 03/25/2022 0259   GFRNONAA 74 12/04/2015 1731   GFRAA 89 08/14/2020 1519   GFRAA 85 12/04/2015 1731   COAGS Lab Results  Component Value Date   INR 1.1 03/23/2022   INR 1.3 (H) 09/01/2018   INR 1.02 06/23/2015   Lipid Panel    Component Value Date/Time   CHOL 124 03/24/2022 0249   CHOL 185 08/14/2020 1519   TRIG 40 03/24/2022 0249   HDL 34 (L) 03/24/2022 0249   HDL 46 08/14/2020 1519   CHOLHDL 3.6 03/24/2022 0249   VLDL 8 03/24/2022 0249   LDLCALC 82 03/24/2022 0249   LDLCALC 119 (H) 08/14/2020 1519   HgbA1C  Lab Results  Component Value Date   HGBA1C 5.8 (H) 03/24/2022   Urinalysis    Component Value Date/Time   COLORURINE AMBER (A) 04/18/2016 1722   APPEARANCEUR Clear 08/14/2020 1519   LABSPEC >=1.030 06/28/2021 1634   PHURINE 5.5 06/28/2021 [place] 06/28/2021 1634   HGBUR TRACE (A) 06/28/2021 1634   BILIRUBINUR SMALL (A) 06/28/2021 1634   BILIRUBINUR Negative 08/14/2020 1519   KETONESUR TRACE (A) 06/28/2021 1634   PROTEINUR NEGATIVE 06/28/2021 1634   UROBILINOGEN 0.2 06/28/2021 1634   NITRITE NEGATIVE 06/28/2021 1634   LEUKOCYTESUR SMALL (A) 06/28/2021 1634   Urine Drug Screen No results found for: "LABOPIA", "COCAINSCRNUR", "LABBENZ", "AMPHETMU", "THCU", "LABBARB"  Alcohol Level    Component Value Date/Time   ETH <10 03/23/2022 2315     SIGNIFICANT DIAGNOSTIC STUDIES MR BRAIN WO CONTRAST  Result Date: 03/24/2022 CLINICAL DATA:  Stroke follow-up, status post thrombectomy EXAM: MRI HEAD WITHOUT CONTRAST MRA HEAD WITHOUT CONTRAST TECHNIQUE: Multiplanar, multi-echo pulse sequences of the brain and surrounding structures were acquired without intravenous contrast. Angiographic images of the Circle of Willis were acquired using MRA technique without intravenous contrast. COMPARISON:  No prior MRI, correlation is made with CT head and CTA head and neck 03/23/2022 FINDINGS: MRI HEAD FINDINGS Brain: Restricted diffusion with ADC correlate in  the right insula and posteroinferior frontal lobe (series 5, images 76-85), with additional more scattered foci in the right parietal and occipital lobe (series 5, images 71-88). These areas are associated with mildly increased T2 hyperintense signal. No acute hemorrhage, mass, mass effect, or midline shift. No hydrocephalus or extra-axial collection. Punctate foci of hemosiderin deposition in the posteroinferior right frontal lobe (series 14, image 36 and 44), right temporal lobe (series 14, images 26 and 29), and right occipital lobe (series 14, images 28 and 32), which may represent small microhemorrhages or thrombi. Vascular: Please see MRA findings below. Skull and upper cervical spine: Normal marrow signal. Sinuses/Orbits: No acute finding. Other: The mastoids  are well aerated. MRA HEAD FINDINGS Anterior circulation: Both internal carotid arteries are patent to the termini, without significant stenosis. Patent left A1. Aplastic right A1. Normal anterior communicating artery. Anterior cerebral arteries are patent to their distal aspects. No M1 stenosis or occlusion. Normal MCA bifurcations. Distal MCA branches perfused and symmetric. The previously noted occluded right M2 now appears patent, and flow is seen in both the superior and inferior M3 branching from it. Posterior circulation: Vertebral arteries patent to the vertebrobasilar junction without stenosis. Diminutive left V4. Basilar patent to its distal aspect. Superior cerebellar arteries patent bilaterally. Patent P1 segments, diminutive on the right. Near fetal origin of the right PCA from a prominent right posterior communicating artery. The left posterior communicating artery is also patent. PCAs perfused to their distal aspects without stenosis. Anatomic variants: Near fetal origin of the right PCA. IMPRESSION: 1. Acute infarcts in the right insula and posteroinferior frontal lobe, with additional more scattered foci in the right parietal and  occipital lobe. 2. The previously noted occluded distal right M2 now appears patent, and flow is seen in both the superior and inferior M3 branching from it. 3. No intracranial large vessel occlusion or significant stenosis. These results will be called to the ordering clinician or representative by the Radiologist Assistant, and communication documented in the PACS or [employer]. Electronically Signed   By: Merilyn Baba M.D.   On: 03/24/2022 23:29   MR ANGIO HEAD WO CONTRAST  Result Date: 03/24/2022 CLINICAL DATA:  Stroke follow-up, status post thrombectomy EXAM: MRI HEAD WITHOUT CONTRAST MRA HEAD WITHOUT CONTRAST TECHNIQUE: Multiplanar, multi-echo pulse sequences of the brain and surrounding structures were acquired without intravenous contrast. Angiographic images of the Circle of Willis were acquired using MRA technique without intravenous contrast. COMPARISON:  No prior MRI, correlation is made with CT head and CTA head and neck 03/23/2022 FINDINGS: MRI HEAD FINDINGS Brain: Restricted diffusion with ADC correlate in the right insula and posteroinferior frontal lobe (series 5, images 76-85), with additional more scattered foci in the right parietal and occipital lobe (series 5, images 71-88). These areas are associated with mildly increased T2 hyperintense signal. No acute hemorrhage, mass, mass effect, or midline shift. No hydrocephalus or extra-axial collection. Punctate foci of hemosiderin deposition in the posteroinferior right frontal lobe (series 14, image 36 and 44), right temporal lobe (series 14, images 26 and 29), and right occipital lobe (series 14, images 28 and 32), which may represent small microhemorrhages or thrombi. Vascular: Please see MRA findings below. Skull and upper cervical spine: Normal marrow signal. Sinuses/Orbits: No acute finding. Other: The mastoids are well aerated. MRA HEAD FINDINGS Anterior circulation: Both internal carotid arteries are patent to the termini, without  significant stenosis. Patent left A1. Aplastic right A1. Normal anterior communicating artery. Anterior cerebral arteries are patent to their distal aspects. No M1 stenosis or occlusion. Normal MCA bifurcations. Distal MCA branches perfused and symmetric. The previously noted occluded right M2 now appears patent, and flow is seen in both the superior and inferior M3 branching from it. Posterior circulation: Vertebral arteries patent to the vertebrobasilar junction without stenosis. Diminutive left V4. Basilar patent to its distal aspect. Superior cerebellar arteries patent bilaterally. Patent P1 segments, diminutive on the right. Near fetal origin of the right PCA from a prominent right posterior communicating artery. The left posterior communicating artery is also patent. PCAs perfused to their distal aspects without stenosis. Anatomic variants: Near fetal origin of the right PCA. IMPRESSION: 1. Acute infarcts in the  right insula and posteroinferior frontal lobe, with additional more scattered foci in the right parietal and occipital lobe. 2. The previously noted occluded distal right M2 now appears patent, and flow is seen in both the superior and inferior M3 branching from it. 3. No intracranial large vessel occlusion or significant stenosis. These results will be called to the ordering clinician or representative by the Radiologist Assistant, and communication documented in the PACS or [employer]. Electronically Signed   By: Merilyn Baba M.D.   On: 03/24/2022 23:29   ECHOCARDIOGRAM COMPLETE  Result Date: 03/24/2022    ECHOCARDIOGRAM REPORT   Patient Name:   Brian Moody. Date of Exam: 03/24/2022 Medical Rec #:  [id number]            Height:       70.5 in Accession #:    [id number]           Weight:       254.0 lb Date of Birth:  31-Oct-1965             BSA:          2.323 m Patient Age:    14 years             BP:           147/82 mmHg Patient Gender: M                    HR:           71 bpm. Exam  Location:  Inpatient Procedure: 2D Echo, Cardiac Doppler and Color Doppler Indications:    I163.9 Stroke  History:        Patient has prior history of Echocardiogram examinations, most                 recent 05/10/2016. Arrythmias:Atrial Fibrillation; Risk                 Factors:Hypertension and Diabetes. Tobacco abuse (From Hx).  Sonographer:    Alvino Chapel RCS Referring Phys: [phone] MCNEILL P Auxier  1. Left ventricular ejection fraction, by estimation, is 60 to 65%. The left ventricle has normal function. The left ventricle has no regional wall motion abnormalities. There is mild concentric left ventricular hypertrophy. Diastolic function indeterminant due to Afib.  2. Right ventricular systolic function is mildly reduced. The right ventricular size is normal. Tricuspid regurgitation signal is inadequate for assessing PA pressure.  3. Left atrial size was moderately dilated.  4. Right atrial size was mildly dilated.  5. The mitral valve is abnormal. Mild to moderate mitral valve regurgitation.  6. The aortic valve is tricuspid. There is mild thickening of the aortic valve. Aortic valve regurgitation is mild. Aortic valve sclerosis is present, with no evidence of aortic valve stenosis.  7. The inferior vena cava is dilated in size with <50% respiratory variability, suggesting right atrial pressure of 15 mmHg. Comparison(s): Compared to prior TTE report in 2017, there is no significant change. Conclusion(s)/Recommendation(s): No intracardiac source of embolism detected on this transthoracic study. Consider a transesophageal echocardiogram to exclude cardiac source of embolism if clinically indicated. FINDINGS  Left Ventricle: Left ventricular ejection fraction, by estimation, is 60 to 65%. The left ventricle has normal function. The left ventricle has no regional wall motion abnormalities. Definity contrast agent was given IV to delineate the left ventricular  endocardial borders. The left  ventricular internal cavity size was normal in size. There is mild concentric left  ventricular hypertrophy. Diastolic function indeterminant due to Afib. Right Ventricle: The right ventricular size is normal. Right vetricular wall thickness was not well visualized. Right ventricular systolic function is mildly reduced. Tricuspid regurgitation signal is inadequate for assessing PA pressure. Left Atrium: Left atrial size was moderately dilated. Right Atrium: Right atrial size was mildly dilated. Pericardium: There is no evidence of pericardial effusion. Mitral Valve: The mitral valve is abnormal. There is mild thickening of the mitral valve leaflet(s). Mild mitral annular calcification. Mild to moderate mitral valve regurgitation. Tricuspid Valve: The tricuspid valve is normal in structure. Tricuspid valve regurgitation is trivial. Aortic Valve: The aortic valve is tricuspid. There is mild thickening of the aortic valve. Aortic valve regurgitation is mild. Aortic valve sclerosis is present, with no evidence of aortic valve stenosis. Pulmonic Valve: The pulmonic valve was not well visualized. Pulmonic valve regurgitation is trivial. Aorta: The aortic root is normal in size and structure. Venous: The inferior vena cava is dilated in size with less than 50% respiratory variability, suggesting right atrial pressure of 15 mmHg. IAS/Shunts: The atrial septum is grossly normal.  LEFT VENTRICLE PLAX 2D LVIDd:         4.40 cm LVIDs:         2.90 cm LV PW:         1.20 cm LV IVS:        1.10 cm LVOT diam:     1.90 cm LV SV:         48 LV SV Index:   21 LVOT Area:     2.84 cm  RIGHT VENTRICLE TAPSE (M-mode): 1.6 cm LEFT ATRIUM             Index        RIGHT ATRIUM           Index LA diam:        4.70 cm 2.02 cm/m   RA Area:     26.10 cm LA Vol (A2C):   96.6 ml 41.59 ml/m  RA Volume:   91.40 ml  39.35 ml/m LA Vol (A4C):   75.1 ml 32.33 ml/m LA Biplane Vol: 92.9 ml 40.00 ml/m  AORTIC VALVE LVOT Vmax:   93.30 cm/s LVOT  Vmean:  58.000 cm/s LVOT VTI:    0.169 m  AORTA Ao Root diam: 2.85 cm MITRAL VALVE MV Area (PHT): 4.80 cm     SHUNTS MV Decel Time: 158 msec     Systemic VTI:  0.17 m MV E velocity: 148.00 cm/s  Systemic Diam: 1.90 cm Gwyndolyn Kaufman MD Electronically signed by Gwyndolyn Kaufman MD Signature Date/Time: 03/24/2022/2:58:07 PM    Final    CT ANGIO HEAD NECK W WO CM (CODE STROKE)  Result Date: 03/23/2022 CLINICAL DATA:  Left facial droop, slurred speech EXAM: CT ANGIOGRAPHY HEAD AND NECK TECHNIQUE: Multidetector CT imaging of the head and neck was performed using the standard protocol during bolus administration of intravenous contrast. Multiplanar CT image reconstructions and MIPs were obtained to evaluate the vascular anatomy. Carotid stenosis measurements (when applicable) are obtained utilizing NASCET criteria, using the distal internal carotid diameter as the denominator. RADIATION DOSE REDUCTION: This exam was performed according to the departmental dose-optimization program which includes automated exposure control, adjustment of the mA and/or kV according to patient size and/or use of iterative reconstruction technique. CONTRAST:  31m OMNIPAQUE IOHEXOL 350 MG/ML SOLN COMPARISON:  No prior CTA available, correlation is made with CT head 03/23/2022 FINDINGS: CT HEAD FINDINGS For noncontrast findings, please  see same day CT head. CTA NECK FINDINGS Aortic arch: Two-vessel arch with a common origin of the brachiocephalic and left common carotid arteries. Imaged portion shows no evidence of aneurysm or dissection. No significant stenosis of the major arch vessel origins. Right carotid system: No evidence of dissection, occlusion, or hemodynamically significant stenosis (greater than 50%). Left carotid system: No evidence of dissection, occlusion, or hemodynamically significant stenosis (greater than 50%). Vertebral arteries: No evidence of dissection, occlusion, or hemodynamically significant stenosis (greater  than 50%). Skeleton: No acute osseous abnormality. Other neck: No acute finding. Upper chest: No focal pulmonary opacity or pleural effusion. Review of the MIP images confirms the above findings CTA HEAD FINDINGS Anterior circulation: Both internal carotid arteries are patent to the termini, without significant stenosis. Patent left A1. Aplastic right A1. Normal anterior communicating artery. Anterior cerebral arteries are patent to their distal aspects. No M1 stenosis or occlusion. A right M2 appears occluded, just proximal to the bifurcation (series 7, image 88-91), with nonopacification of the more inferior M3 branch and opacification of the more superior branch. MCA branches otherwise perfused and symmetric. Posterior circulation: Vertebral arteries patent to the vertebrobasilar junction without stenosis, although the left V4 is quite diminutive after the PICA takeoff. Posterior inferior cerebellar arteries patent proximally. Basilar patent to its distal aspect. Superior cerebellar arteries patent proximally. Patent P1 segments, diminutive on the right. Near fetal origin of the right PCA from a prominent right posterior communicating artery. The left posterior communicating artery is also patent. PCAs perfused to their distal aspects without stenosis. Venous sinuses: As permitted by contrast timing, patent. Anatomic variants: Near fetal origin of the right PCA. Review of the MIP images confirms the above findings IMPRESSION: 1. Occlusion of a distal right M2, near the bifurcation, with nonopacification of the more inferior M3 branch and opacification of the more superior branch. 2. Otherwise no intracranial hemodynamically significant stenosis. 3.  No hemodynamically significant stenosis in the neck. These findings were discussed by telephone on 03/23/2022 at 11:46 pm with provider MCNEILL KIRKPATRICK . Electronically Signed   By: Merilyn Baba M.D.   On: 03/23/2022 23:55   CT HEAD CODE STROKE WO  CONTRAST  Result Date: 03/23/2022 CLINICAL DATA:  Code stroke. Left-sided weakness, facial droop, slurred speech EXAM: CT HEAD WITHOUT CONTRAST TECHNIQUE: Contiguous axial images were obtained from the base of the skull through the vertex without intravenous contrast. RADIATION DOSE REDUCTION: This exam was performed according to the departmental dose-optimization program which includes automated exposure control, adjustment of the mA and/or kV according to patient size and/or use of iterative reconstruction technique. COMPARISON:  None Available. FINDINGS: Brain: No evidence of acute infarction, hemorrhage, cerebral edema, mass, mass effect, or midline shift. No hydrocephalus or extra-axial collection. Vascular: No hyperdense vessel. Skull: Negative for fracture or focal lesion. Sinuses/Orbits: No acute finding. Other: The mastoid air cells are well aerated. ASPECTS [facility] Stroke Program Early CT Score) - Ganglionic level infarction (caudate, lentiform nuclei, internal capsule, insula, M1-M3 cortex): 7 - Supraganglionic infarction (M4-M6 cortex): 3 Total score (0-10 with 10 being normal): 10 IMPRESSION: No evidence of acute intracranial process. ASPECTS is 10. Code stroke imaging results were communicated on 03/23/2022 at 11:33 pm to provider Dr. Leonel Ramsay via secure text paging. Electronically Signed   By: Merilyn Baba M.D.   On: 03/23/2022 23:33      HISTORY OF PRESENT ILLNESS    HOSPITAL COURSE  [name]. is a 56 y.o. male with history of atrial  fibrillation on Eliquis with patient having not taken this medication for about two weeks presenting with acute onset dizziness and left sided weakness with a fall.  He was given TNK (he had missed multiple doses of his Eliquis) and taken to IR for mechanical thrombectomy of the right MCA.  TICI 2c flow was achieved  Stroke:  right MCA stroke s/p TNK and IR with TICI2c, likely due to afib not compliant with eliquis Code Stroke CT head No  acute abnormality. ASPECTS 10.    CTA head & neck occlusion of distal right M2 IR showed right M3 occlusion x2, status post TICI2c reperfusion MRI R MCA scattered infarcts MRA previously noted occluded distal right M2 now appears patent, and flow is seen in both the superior and inferior M3 branch 2D Echo EF 60 to 65% LDL 82 HgbA1c 5.8 VTE prophylaxis - SCDs Eliquis (apixaban) daily prior to admission (but patient not compliant with medication), now on ASA. Plan to restart eliquis in am Therapy recommendations:  none Disposition:  home   Hypertension Home meds:  amlodipine 10 mg daily, bisoprolol 10 mg daily Stable Long-term BP goal normotensive   Atrial fibrillation Patient has persistent atrial fibrillation Was prescribed Eliquis but had not taken this medication for past two weeks and when he was taken he took only once a day Discussed importance of taking Eliquis as prescribed Now on ASA, will start Eliquis in am   Hyperlipidemia Home meds: Lipitor 10 LDL 82, goal < 70 On atorvastatin 20 mg daily  High intensity statin not indicated as LDL near goal Continue statin at discharge   Tobacco abuse Current smoker Smoking cessation counseling provided Pt is willing to quit   Other Stroke Risk Factors Obesity, Body mass index is 35.93 kg/m., BMI >/= 30 associated with increased stroke risk, recommend weight loss, diet and exercise as appropriate    Other Active Problems Bilateral eye subconjunctival hemorrhage post TNK    DISCHARGE EXAM Blood pressure (!) 157/113, pulse (!) 109, temperature 98.2 F (36.8 C), temperature source Oral, resp. rate 20, height 5' 10.5" (1.791 m), SpO2 96 %.   Temp:  [98.1 F (36.7 C)-98.6 F (37 C)] 98.2 F (36.8 C) (10/02 1152) Pulse Rate:  [83-116] 89 (10/02 1100) Resp:  [13-23] 17 (10/02 1100) BP: (109-179)/(61-122) 150/94 (10/02 1100) SpO2:  [90 %-99 %] 99 % (10/02 1100)  General - Well nourished, well developed, in no apparent  distress. Eyes: Conjunctiva hemorrhages improving  Cardiovascular - irregularly irregular rhythm and rate.  Mental Status -  Level of arousal and orientation to time, place, and person were intact. He is alert and oriented, Can name objects and repeat  Language including expression, naming, repetition, comprehension was assessed and found intact. Attention span and concentration were normal. Recent and remote memory were intact. Fund of Knowledge was assessed and was intact.  Cranial Nerves II - XII - II - Visual field intact OU. III, IV, VI - Extraocular movements intact. V - Facial sensation intact bilaterally. VII - Facial movement intact bilaterally. VIII - Hearing & vestibular intact bilaterally. X - Palate elevates symmetrically. XI - Chin turning & shoulder shrug intact bilaterally. XII - Tongue protrusion intact.  Motor Strength - The patient's strength was normal in all extremities and pronator drift was absent.  Bulk was normal and fasciculations were absent.   Motor Tone - Muscle tone was assessed at the neck and appendages and was normal. Sensory - Light touch, temperature/pinprick were assessed and were symmetrical.  Coordination - The patient had normal movements in the hands and feet with no ataxia or dysmetria.  Tremor was absent. Gait and Station - deferred.    Discharge Diet       Diet   Diet regular Room service appropriate? Yes with Assist; Fluid consistency: Thin   liquids  DISCHARGE PLAN Disposition:  home Eliquis BID   for secondary stroke prevention Ongoing stroke risk factor control by Primary Care Physician at time of discharge Follow-up PCP Elsie Stain, MD in 2 weeks. Follow-up in [facility] in 8 weeks, office to schedule an appointment.   50 minutes were spent preparing discharge.  Barnabas Harries DNP, ACNPC-AG    ATTENDING NOTE: I reviewed above note and agree with the assessment and plan. Pt was seen and  examined.   Wife at the bedside. Pt lying in bed, at his baseline except b/l subjunctival hemorrhage. MRI showed scattered right MCA infarcts. Now on ASA, will restarted eliquis tomorrow. Continue statin and quit smoking. PT/OT no recs. Will d/c home with neuro follow up in 4 weeks.  For detailed assessment and plan, please refer to above/below as I have made changes wherever appropriate.   Rosalin Hawking, MD PhD Stroke Neurology 03/25/2022 11:37 PM

## 2022-03-25 NOTE — Evaluation (Signed)
Speech Language Pathology Evaluation Patient Details Name: Brian Moody. MRN: [id number] DOB: 10-20-1965 Today's Date: 03/25/2022 Time: 4098-1191 SLP Time Calculation (min) (ACUTE ONLY): 15 min  Problem List:  Patient Active Problem List   Diagnosis Date Noted   Non-traumatic subconjunctival hemorrhage, bilateral 03/25/2022   Stroke (cerebrum) (HCC) 03/24/2022   Acute right-sided low back pain without sciatica 01/10/2022   Controlled type 2 diabetes mellitus with hyperglycemia, without long-term current use of insulin (HCC) 07/03/2021   Chronic pain of right ankle 08/14/2020   Erectile dysfunction 05/11/2019   Chronic atrial fibrillation (HCC) 09/03/2018   Midline low back pain without sciatica 03/29/2016   Obesity (BMI 30-39.9) 03/29/2016   Bilateral foot pain 07/20/2015   Essential hypertension    Tobacco use    Pre-diabetes    Vitamin D insufficiency 08/24/2013   Other hyperlipidemia 08/24/2013   Past Medical History:  Past Medical History:  Diagnosis Date   Avulsion fracture of distal fibula 06/02/2013   Right distal fibula occurred 04/30/2013    Dysuria 08/14/2020   Hordeolum externum left upper eyelid 06/29/2019   Hypertension    Persistent atrial fibrillation (HCC)    a. newly diagnosed on 05/2015 admission. started on coumadin.    Pre-diabetes    a. HgA1c 6.3 05/2015   Tobacco abuse    Past Surgical History:  Past Surgical History:  Procedure Laterality Date   RADIOLOGY WITH ANESTHESIA N/A 03/24/2022   Procedure: IR WITH ANESTHESIA;  Surgeon: Radiologist, Medication, MD;  Location: [facility] OR;  Service: Radiology;  Laterality: N/A;   HPI:  Brian Moody. is a 56 y.o. male who [facility]/30 with left sided weakness and slurred speech. CT head was negative. S/p thrombectomy 9/30. Acute infarcts in the right insula and posteroinferior frontal  lobe, with additional more scattered foci in the right parietal and  occipital lobe.   Assessment / Plan /  Recommendation Clinical Impression  Pt speech and language are intact. Pt scored 15/30 on [facility] cognitive screen indicating moderate cognitive impiarment. Pt sparticularly struggled with working memory and short term recall, mental math and awareness. Pt flet that he wasnt prepared to pay attention to questions though SLP instructed pt in advance throughout test. Pt may have benefitted from visual and written notes to process instruction. Time pressure and stress seemed to impact him. Wife at bedside recognized pts impairment and education and recommendation for OP SLP assessment after d/c was given. Also offered some basic compensatory strategies that can be instituted at home. Pt to d/c today.    SLP Assessment  SLP Recommendation/Assessment: Patient does not need any further Speech Lanaguage Pathology Services SLP Visit Diagnosis: Attention and concentration deficit Attention and concentration deficit following: Cerebral infarction    Recommendations for follow up therapy are one component of a multi-disciplinary discharge planning process, led by the attending physician.  Recommendations may be updated based on patient status, additional functional criteria and insurance authorization.    Follow Up Recommendations       Assistance Recommended at Discharge     Functional Status Assessment    Frequency and Duration           SLP Evaluation Cognition  Overall Cognitive Status: Impaired/Different from baseline Arousal/Alertness: Awake/alert Orientation Level: Oriented X4 Attention: Selective Selective Attention: Impaired Selective Attention Impairment: Functional complex;Verbal complex Memory: Impaired Memory Impairment: Decreased short term memory Decreased Short Term Memory: Functional complex;Verbal complex Awareness: Impaired Awareness Impairment: Emergent impairment;Intellectual impairment Problem Solving: Impaired Problem Solving Impairment:  Verbal basic;Functional  basic Executive Function: Reasoning Reasoning: Impaired Reasoning Impairment: Verbal basic Behaviors: Poor frustration tolerance       Comprehension  Auditory Comprehension Overall Auditory Comprehension: Appears within functional limits for tasks assessed    Expression Verbal Expression Overall Verbal Expression: Appears within functional limits for tasks assessed   Oral / Motor  Motor Speech Overall Motor Speech: Appears within functional limits for tasks assessed            Takita Riecke, Katherene Ponto 03/25/2022, 1:24 PM

## 2022-03-25 NOTE — Progress Notes (Signed)
Supervising Physician: Pedro Earls  Patient Status:  [facility] - In-pt  Chief Complaint:  Code Stroke, S/p Right M3 occlusion x2 with mechanical thrombectomy  Subjective: Awake, alert, eating breakfast, wife at bedside Reports eyes burning Tender at groin Reports being cold   Allergies: Patient has no known allergies.  Medications: Prior to Admission medications   Medication Sig Start Date End Date Taking? Authorizing Provider  acetaminophen (TYLENOL) 500 MG tablet Take 2 tablets (1,000 mg total) by mouth every 6 (six) hours as needed for headache. 05/05/20  Yes Ladell Pier, MD  atorvastatin (LIPITOR) 10 MG tablet Take 1 tablet by mouth every day Patient taking differently: Take 10 mg by mouth daily. 03/06/22  Yes Elsie Stain, MD  bisoprolol (ZEBETA) 10 MG tablet Take 1 tablet by mouth every day Patient taking differently: Take 10 mg by mouth daily. 01/09/22  Yes Mayers, Cari S, PA-C  cyclobenzaprine (FLEXERIL) 5 MG tablet Take 1 tablet (5 mg total) by mouth 3 (three) times daily as needed for muscle spasms. 01/09/22  Yes Mayers, Cari S, PA-C  ELIQUIS 5 MG TABS tablet Take 1 tablet by mouth twice daily. Appt needed Patient taking differently: Take 5 mg by mouth 2 (two) times daily. 08/15/21  Yes Elsie Stain, MD  metFORMIN (GLUCOPHAGE) 500 MG tablet Take 1 tablet (500 mg total) by mouth daily with breakfast. 01/09/22  Yes Mayers, Cari S, PA-C  Vitamin D, Ergocalciferol, (DRISDOL) 1.25 MG (50000 UNIT) CAPS capsule Take 1 capsule (50,000 Units total) by mouth every 7 (seven) days. 01/14/22  Yes Mayers, Cari S, PA-C  albuterol (VENTOLIN HFA) 108 (90 Base) MCG/ACT inhaler Inhale 1-2 puffs into the lungs every 6 (six) hours as needed for wheezing. Patient not taking: Reported on 01/09/2022 07/02/21   Elsie Stain, MD  amLODipine (NORVASC) 10 MG tablet Take 0.5 tablets (5 mg total) by mouth daily. Patient taking differently: Take 10 mg by mouth daily.  01/09/22   Mayers, Cari S, PA-C  blood glucose meter kit and supplies KIT Dispense based on patient and insurance preference. Use up to four times daily as directed. (FOR ICD-9 250.00, 250.01). Patient not taking: Reported on 01/09/2022 06/29/19   Elsie Stain, MD  Blood Pressure Monitoring (5 SERIES BP MONITOR) DEVI 1 Units by Does not apply route daily. Measure blood pressure daily 06/29/19   Elsie Stain, MD  COVID-19 mRNA bivalent vaccine, Moderna, (MODERNA COVID-19 BIVAL BOOSTER) 50 MCG/0.5ML injection Inject into the muscle. 01/14/22   Carlyle Basques, MD  carvedilol (COREG) 12.5 MG tablet Take 1 tablet (12.5 mg total) by mouth 2 (two) times daily with a meal. 03/02/20 08/14/20  Elsie Stain, MD     Vital Signs: BP (!) 161/122   Pulse (!) 102   Temp 98.3 F (36.8 C) (Oral)   Resp 16   Ht 5' 10.5" (1.791 m)   SpO2 98%   BMI 35.93 kg/m   Physical Exam Constitutional:      General: He is not in acute distress. HENT:     Head: Normocephalic and atraumatic.  Eyes:     Comments: Conjunctiva red from bleeding  Cardiovascular:     Rate and Rhythm: Tachycardia present.     Pulses: Normal pulses.  Pulmonary:     Effort: Pulmonary effort is normal. No respiratory distress.  Musculoskeletal:        General: Normal range of motion.  Skin:    General: Skin is warm and dry.  Comments: R CFA access site soft and without hematoma.  Mildly TTP  Neurological:     General: No focal deficit present.     Mental Status: He is alert.  Psychiatric:        Mood and Affect: Mood normal.        Behavior: Behavior normal.     Imaging: MR BRAIN WO CONTRAST  Result Date: 03/24/2022 CLINICAL DATA:  Stroke follow-up, status post thrombectomy EXAM: MRI HEAD WITHOUT CONTRAST MRA HEAD WITHOUT CONTRAST TECHNIQUE: Multiplanar, multi-echo pulse sequences of the brain and surrounding structures were acquired without intravenous contrast. Angiographic images of the Circle of Willis were  acquired using MRA technique without intravenous contrast. COMPARISON:  No prior MRI, correlation is made with CT head and CTA head and neck 03/23/2022 FINDINGS: MRI HEAD FINDINGS Brain: Restricted diffusion with ADC correlate in the right insula and posteroinferior frontal lobe (series 5, images 76-85), with additional more scattered foci in the right parietal and occipital lobe (series 5, images 71-88). These areas are associated with mildly increased T2 hyperintense signal. No acute hemorrhage, mass, mass effect, or midline shift. No hydrocephalus or extra-axial collection. Punctate foci of hemosiderin deposition in the posteroinferior right frontal lobe (series 14, image 36 and 44), right temporal lobe (series 14, images 26 and 29), and right occipital lobe (series 14, images 28 and 32), which may represent small microhemorrhages or thrombi. Vascular: Please see MRA findings below. Skull and upper cervical spine: Normal marrow signal. Sinuses/Orbits: No acute finding. Other: The mastoids are well aerated. MRA HEAD FINDINGS Anterior circulation: Both internal carotid arteries are patent to the termini, without significant stenosis. Patent left A1. Aplastic right A1. Normal anterior communicating artery. Anterior cerebral arteries are patent to their distal aspects. No M1 stenosis or occlusion. Normal MCA bifurcations. Distal MCA branches perfused and symmetric. The previously noted occluded right M2 now appears patent, and flow is seen in both the superior and inferior M3 branching from it. Posterior circulation: Vertebral arteries patent to the vertebrobasilar junction without stenosis. Diminutive left V4. Basilar patent to its distal aspect. Superior cerebellar arteries patent bilaterally. Patent P1 segments, diminutive on the right. Near fetal origin of the right PCA from a prominent right posterior communicating artery. The left posterior communicating artery is also patent. PCAs perfused to their distal  aspects without stenosis. Anatomic variants: Near fetal origin of the right PCA. IMPRESSION: 1. Acute infarcts in the right insula and posteroinferior frontal lobe, with additional more scattered foci in the right parietal and occipital lobe. 2. The previously noted occluded distal right M2 now appears patent, and flow is seen in both the superior and inferior M3 branching from it. 3. No intracranial large vessel occlusion or significant stenosis. These results will be called to the ordering clinician or representative by the Radiologist Assistant, and communication documented in the PACS or [employer]. Electronically Signed   By: Merilyn Baba M.D.   On: 03/24/2022 23:29   MR ANGIO HEAD WO CONTRAST  Result Date: 03/24/2022 CLINICAL DATA:  Stroke follow-up, status post thrombectomy EXAM: MRI HEAD WITHOUT CONTRAST MRA HEAD WITHOUT CONTRAST TECHNIQUE: Multiplanar, multi-echo pulse sequences of the brain and surrounding structures were acquired without intravenous contrast. Angiographic images of the Circle of Willis were acquired using MRA technique without intravenous contrast. COMPARISON:  No prior MRI, correlation is made with CT head and CTA head and neck 03/23/2022 FINDINGS: MRI HEAD FINDINGS Brain: Restricted diffusion with ADC correlate in the right insula and posteroinferior frontal lobe (series  5, images 76-85), with additional more scattered foci in the right parietal and occipital lobe (series 5, images 71-88). These areas are associated with mildly increased T2 hyperintense signal. No acute hemorrhage, mass, mass effect, or midline shift. No hydrocephalus or extra-axial collection. Punctate foci of hemosiderin deposition in the posteroinferior right frontal lobe (series 14, image 36 and 44), right temporal lobe (series 14, images 26 and 29), and right occipital lobe (series 14, images 28 and 32), which may represent small microhemorrhages or thrombi. Vascular: Please see MRA findings below. Skull  and upper cervical spine: Normal marrow signal. Sinuses/Orbits: No acute finding. Other: The mastoids are well aerated. MRA HEAD FINDINGS Anterior circulation: Both internal carotid arteries are patent to the termini, without significant stenosis. Patent left A1. Aplastic right A1. Normal anterior communicating artery. Anterior cerebral arteries are patent to their distal aspects. No M1 stenosis or occlusion. Normal MCA bifurcations. Distal MCA branches perfused and symmetric. The previously noted occluded right M2 now appears patent, and flow is seen in both the superior and inferior M3 branching from it. Posterior circulation: Vertebral arteries patent to the vertebrobasilar junction without stenosis. Diminutive left V4. Basilar patent to its distal aspect. Superior cerebellar arteries patent bilaterally. Patent P1 segments, diminutive on the right. Near fetal origin of the right PCA from a prominent right posterior communicating artery. The left posterior communicating artery is also patent. PCAs perfused to their distal aspects without stenosis. Anatomic variants: Near fetal origin of the right PCA. IMPRESSION: 1. Acute infarcts in the right insula and posteroinferior frontal lobe, with additional more scattered foci in the right parietal and occipital lobe. 2. The previously noted occluded distal right M2 now appears patent, and flow is seen in both the superior and inferior M3 branching from it. 3. No intracranial large vessel occlusion or significant stenosis. These results will be called to the ordering clinician or representative by the Radiologist Assistant, and communication documented in the PACS or [employer]. Electronically Signed   By: Merilyn Baba M.D.   On: 03/24/2022 23:29   ECHOCARDIOGRAM COMPLETE  Result Date: 03/24/2022    ECHOCARDIOGRAM REPORT   Patient Name:   Brian Moody. Date of Exam: 03/24/2022 Medical Rec #:  [id number]            Height:       70.5 in Accession #:     [id number]           Weight:       254.0 lb Date of Birth:  09-14-1965             BSA:          2.323 m Patient Age:    56 years             BP:           147/82 mmHg Patient Gender: M                    HR:           71 bpm. Exam Location:  Inpatient Procedure: 2D Echo, Cardiac Doppler and Color Doppler Indications:    I163.9 Stroke  History:        Patient has prior history of Echocardiogram examinations, most                 recent 05/10/2016. Arrythmias:Atrial Fibrillation; Risk                 Factors:Hypertension and Diabetes.  Tobacco abuse (From Hx).  Sonographer:    Alvino Chapel RCS Referring Phys: [phone] MCNEILL P [place]  1. Left ventricular ejection fraction, by estimation, is 60 to 65%. The left ventricle has normal function. The left ventricle has no regional wall motion abnormalities. There is mild concentric left ventricular hypertrophy. Diastolic function indeterminant due to Afib.  2. Right ventricular systolic function is mildly reduced. The right ventricular size is normal. Tricuspid regurgitation signal is inadequate for assessing PA pressure.  3. Left atrial size was moderately dilated.  4. Right atrial size was mildly dilated.  5. The mitral valve is abnormal. Mild to moderate mitral valve regurgitation.  6. The aortic valve is tricuspid. There is mild thickening of the aortic valve. Aortic valve regurgitation is mild. Aortic valve sclerosis is present, with no evidence of aortic valve stenosis.  7. The inferior vena cava is dilated in size with <50% respiratory variability, suggesting right atrial pressure of 15 mmHg. Comparison(s): Compared to prior TTE report in 2017, there is no significant change. Conclusion(s)/Recommendation(s): No intracardiac source of embolism detected on this transthoracic study. Consider a transesophageal echocardiogram to exclude cardiac source of embolism if clinically indicated. FINDINGS  Left Ventricle: Left ventricular ejection fraction, by  estimation, is 60 to 65%. The left ventricle has normal function. The left ventricle has no regional wall motion abnormalities. Definity contrast agent was given IV to delineate the left ventricular  endocardial borders. The left ventricular internal cavity size was normal in size. There is mild concentric left ventricular hypertrophy. Diastolic function indeterminant due to Afib. Right Ventricle: The right ventricular size is normal. Right vetricular wall thickness was not well visualized. Right ventricular systolic function is mildly reduced. Tricuspid regurgitation signal is inadequate for assessing PA pressure. Left Atrium: Left atrial size was moderately dilated. Right Atrium: Right atrial size was mildly dilated. Pericardium: There is no evidence of pericardial effusion. Mitral Valve: The mitral valve is abnormal. There is mild thickening of the mitral valve leaflet(s). Mild mitral annular calcification. Mild to moderate mitral valve regurgitation. Tricuspid Valve: The tricuspid valve is normal in structure. Tricuspid valve regurgitation is trivial. Aortic Valve: The aortic valve is tricuspid. There is mild thickening of the aortic valve. Aortic valve regurgitation is mild. Aortic valve sclerosis is present, with no evidence of aortic valve stenosis. Pulmonic Valve: The pulmonic valve was not well visualized. Pulmonic valve regurgitation is trivial. Aorta: The aortic root is normal in size and structure. Venous: The inferior vena cava is dilated in size with less than 50% respiratory variability, suggesting right atrial pressure of 15 mmHg. IAS/Shunts: The atrial septum is grossly normal.  LEFT VENTRICLE PLAX 2D LVIDd:         4.40 cm LVIDs:         2.90 cm LV PW:         1.20 cm LV IVS:        1.10 cm LVOT diam:     1.90 cm LV SV:         48 LV SV Index:   21 LVOT Area:     2.84 cm  RIGHT VENTRICLE TAPSE (M-mode): 1.6 cm LEFT ATRIUM             Index        RIGHT ATRIUM           Index LA diam:        4.70  cm 2.02 cm/m   RA Area:     26.10 cm LA  Vol [facility]):   96.6 ml 41.59 ml/m  RA Volume:   91.40 ml  39.35 ml/m LA Vol (A4C):   75.1 ml 32.33 ml/m LA Biplane Vol: 92.9 ml 40.00 ml/m  AORTIC VALVE LVOT Vmax:   93.30 cm/s LVOT Vmean:  58.000 cm/s LVOT VTI:    0.169 m  AORTA Ao Root diam: 2.85 cm MITRAL VALVE MV Area (PHT): 4.80 cm     SHUNTS MV Decel Time: 158 msec     Systemic VTI:  0.17 m MV E velocity: 148.00 cm/s  Systemic Diam: 1.90 cm Gwyndolyn Kaufman MD Electronically signed by Gwyndolyn Kaufman MD Signature Date/Time: 03/24/2022/2:58:07 PM    Final    CT ANGIO HEAD NECK W WO CM (CODE STROKE)  Result Date: 03/23/2022 CLINICAL DATA:  Left facial droop, slurred speech EXAM: CT ANGIOGRAPHY HEAD AND NECK TECHNIQUE: Multidetector CT imaging of the head and neck was performed using the standard protocol during bolus administration of intravenous contrast. Multiplanar CT image reconstructions and MIPs were obtained to evaluate the vascular anatomy. Carotid stenosis measurements (when applicable) are obtained utilizing NASCET criteria, using the distal internal carotid diameter as the denominator. RADIATION DOSE REDUCTION: This exam was performed according to the departmental dose-optimization program which includes automated exposure control, adjustment of the mA and/or kV according to patient size and/or use of iterative reconstruction technique. CONTRAST:  42m OMNIPAQUE IOHEXOL 350 MG/ML SOLN COMPARISON:  No prior CTA available, correlation is made with CT head 03/23/2022 FINDINGS: CT HEAD FINDINGS For noncontrast findings, please see same day CT head. CTA NECK FINDINGS Aortic arch: Two-vessel arch with a common origin of the brachiocephalic and left common carotid arteries. Imaged portion shows no evidence of aneurysm or dissection. No significant stenosis of the major arch vessel origins. Right carotid system: No evidence of dissection, occlusion, or hemodynamically significant stenosis (greater than 50%).  Left carotid system: No evidence of dissection, occlusion, or hemodynamically significant stenosis (greater than 50%). Vertebral arteries: No evidence of dissection, occlusion, or hemodynamically significant stenosis (greater than 50%). Skeleton: No acute osseous abnormality. Other neck: No acute finding. Upper chest: No focal pulmonary opacity or pleural effusion. Review of the MIP images confirms the above findings CTA HEAD FINDINGS Anterior circulation: Both internal carotid arteries are patent to the termini, without significant stenosis. Patent left A1. Aplastic right A1. Normal anterior communicating artery. Anterior cerebral arteries are patent to their distal aspects. No M1 stenosis or occlusion. A right M2 appears occluded, just proximal to the bifurcation (series 7, image 88-91), with nonopacification of the more inferior M3 branch and opacification of the more superior branch. MCA branches otherwise perfused and symmetric. Posterior circulation: Vertebral arteries patent to the vertebrobasilar junction without stenosis, although the left V4 is quite diminutive after the PICA takeoff. Posterior inferior cerebellar arteries patent proximally. Basilar patent to its distal aspect. Superior cerebellar arteries patent proximally. Patent P1 segments, diminutive on the right. Near fetal origin of the right PCA from a prominent right posterior communicating artery. The left posterior communicating artery is also patent. PCAs perfused to their distal aspects without stenosis. Venous sinuses: As permitted by contrast timing, patent. Anatomic variants: Near fetal origin of the right PCA. Review of the MIP images confirms the above findings IMPRESSION: 1. Occlusion of a distal right M2, near the bifurcation, with nonopacification of the more inferior M3 branch and opacification of the more superior branch. 2. Otherwise no intracranial hemodynamically significant stenosis. 3.  No hemodynamically significant stenosis in  the neck. These findings were  discussed by telephone on 03/23/2022 at 11:46 pm with provider MCNEILL KIRKPATRICK . Electronically Signed   By: Merilyn Baba M.D.   On: 03/23/2022 23:55   CT HEAD CODE STROKE WO CONTRAST  Result Date: 03/23/2022 CLINICAL DATA:  Code stroke. Left-sided weakness, facial droop, slurred speech EXAM: CT HEAD WITHOUT CONTRAST TECHNIQUE: Contiguous axial images were obtained from the base of the skull through the vertex without intravenous contrast. RADIATION DOSE REDUCTION: This exam was performed according to the departmental dose-optimization program which includes automated exposure control, adjustment of the mA and/or kV according to patient size and/or use of iterative reconstruction technique. COMPARISON:  None Available. FINDINGS: Brain: No evidence of acute infarction, hemorrhage, cerebral edema, mass, mass effect, or midline shift. No hydrocephalus or extra-axial collection. Vascular: No hyperdense vessel. Skull: Negative for fracture or focal lesion. Sinuses/Orbits: No acute finding. Other: The mastoid air cells are well aerated. ASPECTS [facility] Stroke Program Early CT Score) - Ganglionic level infarction (caudate, lentiform nuclei, internal capsule, insula, M1-M3 cortex): 7 - Supraganglionic infarction (M4-M6 cortex): 3 Total score (0-10 with 10 being normal): 10 IMPRESSION: No evidence of acute intracranial process. ASPECTS is 10. Code stroke imaging results were communicated on 03/23/2022 at 11:33 pm to provider Dr. Leonel Ramsay via secure text paging. Electronically Signed   By: Merilyn Baba M.D.   On: 03/23/2022 23:33    Labs:  CBC: Recent Labs    03/23/22 2315 03/23/22 2318 03/24/22 0835 03/25/22 0259  WBC 8.1  --  7.0 7.6  HGB 14.7 15.0 14.7 13.7  HCT 44.0 44.0 43.5 40.6  PLT 214  --  178 185    COAGS: Recent Labs    03/23/22 2315  INR 1.1  APTT 33    BMP: Recent Labs    06/28/21 1655 06/28/21 1655 07/02/21 1204 01/09/22 1443 03/23/22 2315  03/23/22 2318 03/24/22 0835 03/25/22 0259  NA 136   < > 141 138 141 141 141 140  K 3.8  --  4.6 3.9 3.3* 3.2* 3.6 3.4*  CL 100  --  102 104 106 104 106 103  CO2 27  --  24  --  25  --  24 26  GLUCOSE 76   < > 139* 71 140* 135* 89 120*  BUN 11   < > '11 11 16 17 11 8  ' CALCIUM 8.8*  --  9.3 9.1 9.0  --  8.4* 8.5*  CREATININE 1.00  --  1.24 1.04 1.21 1.10 1.09 1.13  GFRNONAA >60  --   --   --  >60  --  >60 >60   < > = values in this interval not displayed.    LIVER FUNCTION TESTS: Recent Labs    01/09/22 1443 03/23/22 2315  BILITOT <0.2 0.4  AST 16 23  ALT  --  15  ALKPHOS 97 75  PROT 7.1 7.6  ALBUMIN 4.1 3.5    Assessment and Plan:  Code stroke - now 1 day s/p intervention/thrombectomy, evaluated by Dr. Margarita Sermons, appearing to be doing well -redness of eyes expected to improve over time -discussed remaining on Eliquis critical to reducing risk of future stroke  NIR to sign off at this time, but remain available as needed for re-consult.   Electronically Signed: Pasty Spillers, PA 03/25/2022, 10:18 AM   I spent a total of 15 Minutes at the the patient's bedside AND on the patient's hospital floor or unit, greater than 50% of which was counseling/coordinating care for stroke care

## 2022-03-26 ENCOUNTER — Telehealth: Payer: Self-pay

## 2022-03-26 NOTE — Anesthesia Postprocedure Evaluation (Signed)
Anesthesia Post Note  Patient: Brian Moody.  Procedure(s) Performed: IR WITH ANESTHESIA     Patient location during evaluation: PACU Anesthesia Type: General Level of consciousness: awake and alert Pain management: pain level controlled Vital Signs Assessment: post-procedure vital signs reviewed and stable Respiratory status: spontaneous breathing, nonlabored ventilation, respiratory function stable and patient connected to nasal cannula oxygen Cardiovascular status: blood pressure returned to baseline and stable Postop Assessment: no apparent nausea or vomiting Anesthetic complications: no   No notable events documented.  Last Vitals:  Vitals:   03/25/22 1100 03/25/22 1152  BP: (!) 150/94   Pulse: 89   Resp: 17   Temp:  36.8 C  SpO2: 99%     Last Pain:  Vitals:   03/25/22 1152  TempSrc: Oral  PainSc:                  [place] S

## 2022-03-26 NOTE — Patient Outreach (Signed)
  Care Coordination TOC Note Transition Care Management Follow-up Telephone Call Date of discharge and from where: 03/25/22-[place] How have you been since you were released from the hospital? Patient stets he is doing well since returning home yesterday. He is currently out eating breakfast.  Any questions or concerns? No  Items Reviewed: Did the pt receive and understand the discharge instructions provided? Yes  Medications obtained and verified?  Unable to review meds-pt not at home. States he is going to pharmacy after breakfast to pick up meds.  Other?  Post stroke care Any new allergies since your discharge? No  Dietary orders reviewed? Yes Do you have support at home? Yes   Home Care and Equipment/Supplies: Were home health services ordered? no If so, what is the name of the agency? N/A  Has the agency set up a time to come to the patient's home? not applicable Were any new equipment or medical supplies ordered?  No What is the name of the medical supply agency? N/a Were you able to get the supplies/equipment? not applicable Do you have any questions related to the use of the equipment or supplies? No  Functional Questionnaire: (I = Independent and D = Dependent) ADLs: I  Bathing/Dressing- I  Meal Prep- I  Eating- I  Maintaining continence- I  Transferring/Ambulation- I  Managing Meds- I  Follow up appointments reviewed:  PCP Hospital f/u appt confirmed? No -Patient just discharged yesterday. [facility] f/u appt confirmed? No  -Patient states he will call today to make appt. Are transportation arrangements needed? No  If their condition worsens, is the pt aware to call PCP or go to the Emergency Dept.? Yes Was the patient provided with contact information for the PCP's office or ED? Yes Was to pt encouraged to call back with questions or concerns? Yes  SDOH assessments and interventions completed:   Yes  Care Coordination Interventions Activated:   Yes   Care Coordination Interventions:  PCP follow up appointment requested   Encounter Outcome:  Pt. Visit Completed    Enzo Montgomery, RN,BSN,CCM [facility] Telephonic Care Management Coordinator Direct Phone: [phone] Toll Free: [phone] Fax: [phone]

## 2022-04-09 ENCOUNTER — Telehealth: Payer: Self-pay

## 2022-04-09 NOTE — Patient Outreach (Signed)
  Emmi Stroke Care Coordination Follow Up  04/09/2022 Name:  Brian Moody. MRN:  [id number] DOB:  07-03-1965  Subjective: Brian Moody. is a 56 y.o. year old male who is a primary care patient of Elsie Stain, MD   An Emmi alert was received indicating patient responded to questions: Went to follow-up appointment?.   I reached out by phone to follow up on the alert and spoke to Patient. He voices he is doing well and started back working today. Reviewed and addressed red alert. Patient confirms he has neuro follow up appt on 04/22/22. He had PCP appt that was scheduled but had to cancel/reschedule appt. He plans to call office today to make another appt. Denies any issues with transportation. No further RN CM needs or concerns at this time.   Care Coordination Interventions Activated:  Yes  Care Coordination Interventions:  Yes, provided   Follow up plan: No further intervention required. Patient has completed automated EMMI-STROKE post discharge calls.  Encounter Outcome:  Pt. Visit Completed   Enzo Montgomery, RN,BSN,CCM [place] [facility] Telephonic Care Management Coordinator Direct Phone: [phone] Toll Free: [phone] Fax: [phone]

## 2022-04-09 NOTE — Patient Outreach (Signed)
Received a red flag Emmi stroke notification for Mr. Doolan . I have assigned Enzo Montgomery, RN to call for follow up and determine if there are any Case Management needs.    [place], [place], [place] Management [phone]

## 2022-04-22 ENCOUNTER — Inpatient Hospital Stay: Payer: Self-pay | Admitting: Adult Health

## 2022-04-24 NOTE — Progress Notes (Deleted)
[facility] [address] [place]. [place] [address] [phone]       HOSPITAL FOLLOW UP NOTE  [name]. Date of Birth:  [birth year] Medical Record Number:  [id number]   Reason for Referral:  hospital stroke follow up    SUBJECTIVE:   CHIEF COMPLAINT:  No chief complaint on file.   HPI:   [name]. is a 56 y.o. male with history of atrial fibrillation on Eliquis with patient having not taken this medication for about two weeks who presented on 03/23/2022 with acute onset dizziness and left sided weakness with a fall.  He was evaluated by Dr. Erlinda Hong for right MCA stroke s/p TNK and IR with TICI2c reperfusion, likely due to A-fib not compliant on Eliquis.  Discussed importance of twice daily dosing of Eliquis without any missed dosages and restart Eliquis upon discharge.  Also increased home dose atorvastatin 10 mg to 20 mg daily, LDL 82.  Current tobacco user smoking cessation counseling provided.  He is evaluated by therapies, no therapy needs and discharged home.        PERTINENT IMAGING  Per hospitalization 03/23/2022 -03/25/2022 Code Stroke CT head No acute abnormality. ASPECTS 10.    CTA head & neck occlusion of distal right M2 IR showed right M3 occlusion x2, status post TICI2c reperfusion MRI R MCA scattered infarcts MRA previously noted occluded distal right M2 now appears patent, and flow is seen in both the superior and inferior M3 branch 2D Echo EF 60 to 65% LDL 82 HgbA1c 5.8    ROS:   14 system review of systems performed and negative with exception of ***  PMH:  Past Medical History:  Diagnosis Date   Avulsion fracture of distal fibula 06/02/2013   Right distal fibula occurred 04/30/2013    Dysuria 08/14/2020   Hordeolum externum left upper eyelid 06/29/2019   Hypertension    Persistent atrial fibrillation ([place])    a. newly diagnosed on 05/2015 admission. started on coumadin.    Pre-diabetes    a. HgA1c 6.3  05/2015   Tobacco abuse     PSH:  Past Surgical History:  Procedure Laterality Date   IR CT HEAD LTD  03/24/2022   IR PERCUTANEOUS ART THROMBECTOMY/INFUSION INTRACRANIAL INC DIAG ANGIO  03/24/2022   IR US GUIDE VASC ACCESS RIGHT  03/24/2022   RADIOLOGY WITH ANESTHESIA N/A 03/24/2022   Procedure: IR WITH ANESTHESIA;  Surgeon: Radiologist, Medication, MD;  Location: [facility];  Service: Radiology;  Laterality: N/A;    Social History:  Social History   Socioeconomic History   Marital status: Married    Spouse name: Not on file   Number of children: Not on file   Years of education: Not on file   Highest education level: Not on file  Occupational History   Not on file  Tobacco Use   Smoking status: Every Day    Packs/day: 0.10    Types: Cigarettes    Passive exposure: Never   Smokeless tobacco: Never  Vaping Use   Vaping Use: Never used  Substance and Sexual Activity   Alcohol use: No   Drug use: No   Sexual activity: Not Currently  Other Topics Concern   Not on file  Social History Narrative   Not on file   Social Determinants of Health   Financial Resource Strain: Not on file  Food Insecurity: Not on file  Transportation Needs: No Transportation Needs (03/26/2022)   [place] - Transportation  Lack of Transportation (Medical): No    Lack of Transportation (Non-Medical): No  Physical Activity: Not on file  Stress: Not on file  Social Connections: Not on file  Intimate Partner Violence: Not on file    Family History:  Family History  Problem Relation Age of Onset   Hypertension Father    Diabetes Maternal Grandmother    Hypertension Maternal Grandmother    Diabetes Maternal Grandfather    Hypertension Maternal Grandfather     Medications:   Current Outpatient Medications on File Prior to Visit  Medication Sig Dispense Refill   acetaminophen (TYLENOL) 500 MG tablet Take 2 tablets (1,000 mg total) by mouth every 6 (six) hours as needed for headache. 60 tablet 2    amLODipine (NORVASC) 10 MG tablet Take 1 tablet (10 mg total) by mouth daily. 30 tablet 0   atorvastatin (LIPITOR) 20 MG tablet Take 1 tablet (20 mg total) by mouth daily. 30 tablet 0   bisoprolol (ZEBETA) 10 MG tablet Take 1 tablet (10 mg total) by mouth daily. 30 tablet 0   blood glucose meter kit and supplies KIT Dispense based on patient and insurance preference. Use up to four times daily as directed. (FOR ICD-9 250.00, 250.01). (Patient not taking: Reported on 01/09/2022) 1 each 0   Blood Pressure Monitoring (5 SERIES BP MONITOR) DEVI 1 Units by Does not apply route daily. Measure blood pressure daily 1 each 0   cyclobenzaprine (FLEXERIL) 5 MG tablet Take 1 tablet (5 mg total) by mouth 3 (three) times daily as needed for muscle spasms. 30 tablet 0   ELIQUIS 5 MG TABS tablet Take 1 tablet by mouth twice daily. Appt needed (Patient taking differently: Take 5 mg by mouth 2 (two) times daily.) 60 tablet 11   metFORMIN (GLUCOPHAGE) 500 MG tablet Take 1 tablet (500 mg total) by mouth daily with breakfast. 90 tablet 1   polyvinyl alcohol (LIQUIFILM TEARS) 1.4 % ophthalmic solution Place 1 drop into both eyes as needed for dry eyes. 15 mL 0   Vitamin D, Ergocalciferol, (DRISDOL) 1.25 MG (50000 UNIT) CAPS capsule Take 1 capsule (50,000 Units total) by mouth every 7 (seven) days. 4 capsule 2   [DISCONTINUED] carvedilol (COREG) 12.5 MG tablet Take 1 tablet (12.5 mg total) by mouth 2 (two) times daily with a meal. 60 tablet 6   No current facility-administered medications on file prior to visit.    Allergies:  No Known Allergies    OBJECTIVE:  Physical Exam  There were no vitals filed for this visit. There is no height or weight on file to calculate BMI. No results found.     01/09/2022    2:26 PM  Depression screen PHQ 2/9  Decreased Interest 0  Down, Depressed, Hopeless 0  PHQ - 2 Score 0  Altered sleeping 0  Tired, decreased energy 1  Change in appetite 1  Feeling bad or failure  about yourself  0  Trouble concentrating 0  Moving slowly or fidgety/restless 0  Suicidal thoughts 0  PHQ-9 Score 2     General: well developed, well nourished, seated, in no evident distress Head: head normocephalic and atraumatic.   Neck: supple with no carotid or supraclavicular bruits Cardiovascular: regular rate and rhythm, no murmurs Musculoskeletal: no deformity Skin:  no rash/petichiae Vascular:  Normal pulses all extremities   Neurologic Exam Mental Status: Awake and fully alert. Oriented to place and time. Recent and remote memory intact. Attention span, concentration and fund of knowledge appropriate. Mood  and affect appropriate.  Cranial Nerves: Fundoscopic exam reveals sharp disc margins. Pupils equal, briskly reactive to light. Extraocular movements full without nystagmus. Visual fields full to confrontation. Hearing intact. Facial sensation intact. Face, tongue, palate moves normally and symmetrically.  Motor: Normal bulk and tone. Normal strength in all tested extremity muscles Sensory.: intact to touch , pinprick , position and vibratory sensation.  Coordination: Rapid alternating movements normal in all extremities. Finger-to-nose and heel-to-shin performed accurately bilaterally. Gait and Station: Arises from chair without difficulty. Stance is normal. Gait demonstrates normal stride length and balance with ***. Tandem walk and heel toe ***.  Reflexes: 1+ and symmetric. Toes downgoing.     NIHSS  *** Modified Rankin  ***      ASSESSMENT: Brian Moody. is a 56 y.o. year old male with right MCA stroke on 03/23/2022 s/p TNK and IR with TICI 2C reperfusion likely due to A-fib not compliant with Eliquis. Vascular risk factors include atrial fibrillation and Eliquis noncompliance, HTN, HLD, tobacco use and obesity.      PLAN:  Right MCA stroke:  Residual deficit: ***.  Continue Eliquis 64m twice daily and atorvastatin (Lipitor) for secondary stroke  prevention.   Discussed secondary stroke prevention measures and importance of close PCP follow up for aggressive stroke risk factor management including BP goal<130/90, HLD with LDL goal<70 and DM with A1c.<7 .  Stroke labs 02/2022: LDL 82, A1c 5.8 I have gone over the pathophysiology of stroke, warning signs and symptoms, risk factors and their management in some detail with instructions to go to the closest emergency room for symptoms of concern.     Follow up in *** or call earlier if needed   CC:  [facility] provider: Dr. SLeonie ManPCP: WElsie Stain MD    I spent *** minutes of face-to-face and non-face-to-face time with patient.  This included previsit chart review including review of recent hospitalization, lab review, study review, order entry, electronic health record documentation, patient education regarding recent stroke including etiology, secondary stroke prevention measures and importance of managing stroke risk factors, residual deficits and typical recovery time and answered all other questions to patient satisfaction   JFrann Rider AGNP-BC  [facility] [address] [address] Phone [phone] [phone]: This document was prepared with digital dictation and possible smart phrase technology. Any transcriptional errors that result from this process are unintentional.

## 2022-04-25 ENCOUNTER — Inpatient Hospital Stay: Payer: Self-pay | Admitting: Adult Health

## 2022-04-25 ENCOUNTER — Encounter: Payer: Self-pay | Admitting: Adult Health

## 2022-05-05 ENCOUNTER — Other Ambulatory Visit: Payer: Self-pay | Admitting: Critical Care Medicine

## 2022-05-06 ENCOUNTER — Other Ambulatory Visit: Payer: Self-pay | Admitting: Critical Care Medicine

## 2022-05-06 NOTE — Telephone Encounter (Signed)
Requested Prescriptions  Pending Prescriptions Disp Refills   atorvastatin (LIPITOR) 10 MG tablet [Pharmacy Med Name: Atorvastatin Calcium 10mg  Tablet] 90 tablet 3    Sig: Take 1 tablet by mouth every day     Cardiovascular:  Antilipid - Statins Failed - 05/05/2022  1:08 AM      Failed - Lipid Panel in normal range within the last 12 months    Cholesterol, Total  Date Value Ref Range Status  08/14/2020 185 100 - 199 mg/dL Final   Cholesterol  Date Value Ref Range Status  03/24/2022 124 0 - 200 mg/dL Final   LDL Chol Calc (NIH)  Date Value Ref Range Status  08/14/2020 119 (H) 0 - 99 mg/dL Final   LDL Cholesterol  Date Value Ref Range Status  03/24/2022 82 0 - 99 mg/dL Final    Comment:           Total Cholesterol/HDL:CHD Risk Coronary Heart Disease Risk Table                     Men   Women  1/2 Average Risk   3.4   3.3  Average Risk       5.0   4.4  2 X Average Risk   9.6   7.1  3 X Average Risk  23.4   11.0        Use the calculated Patient Ratio above and the CHD Risk Table to determine the patient's CHD Risk.        ATP III CLASSIFICATION (LDL):  <100     mg/dL   Optimal  05/24/2022  mg/dL   Near or Above                    Optimal  130-159  mg/dL   Borderline  960-454  mg/dL   High  098-119     mg/dL   Very High Performed at [facility], [address], [place], [place]    HDL  Date Value Ref Range Status  03/24/2022 34 (L) >40 mg/dL Final  05/24/2022 46 21/30/8657 mg/dL Final   Triglycerides  Date Value Ref Range Status  03/24/2022 40 <150 mg/dL Final         Passed - Patient is not pregnant      Passed - Valid encounter within last 12 months    Recent Outpatient Visits           3 months ago Controlled type 2 diabetes mellitus with hyperglycemia, without long-term current use of insulin (HCC)   Primary Care at [facility], Cari S, PA-C   10 months ago Essential hypertension   [facility] 08-[birth year][facility], MD   1 year ago Dysuria   [facility], MD   2 years ago Chronic pain of right ankle   [facility], MD   2 years ago Essential hypertension   [facility] Marcine Matar, MD       Future Appointments             In 3 weeks Storm Frisk, MD [facility]

## 2022-05-06 NOTE — Telephone Encounter (Signed)
Medication Refill - Medication: amLODipine (NORVASC) 10 MG tablet  and patient states he is out of all medications except ELIQUIS 5 MG TABS tablet   Has the patient contacted their pharmacy? Yes.    (Agent: If yes, when and what did the pharmacy advise?) Call PCP office   Preferred Pharmacy (with phone number or street name):   [id number] - [place], [place] - [address] Phone: [phone]  Fax: [phone]      Has the patient been seen for an appointment in the last year OR does the patient have an upcoming appointment? Yes.    Agent: Please be advised that RX refills may take up to 3 business days. We ask that you follow-up with your pharmacy.

## 2022-05-07 MED ORDER — AMLODIPINE BESYLATE 10 MG PO TABS: 10.0000 mg | ORAL_TABLET | Freq: Every day | ORAL | 0 refills | Status: DC

## 2022-05-07 MED ORDER — BISOPROLOL FUMARATE 10 MG PO TABS: 10.0000 mg | ORAL_TABLET | Freq: Every day | ORAL | 0 refills | Status: DC

## 2022-05-07 NOTE — Telephone Encounter (Signed)
Requested medications are due for refill today.  yes  Requested medications are on the active medications list.  yes  Last refill. 03/26/2022 #30 0 rf  Future visit scheduled.   yes  Notes to clinic.  Rxs signed by Gevena Mart    Requested Prescriptions  Pending Prescriptions Disp Refills   amLODipine (NORVASC) 10 MG tablet 30 tablet 0    Sig: Take 1 tablet (10 mg total) by mouth daily.     Cardiovascular: Calcium Channel Blockers 2 Failed - 05/06/2022  1:17 PM      Failed - Last BP in normal range    BP Readings from Last 1 Encounters:  03/25/22 (!) 150/94         Passed - Last Heart Rate in normal range    Pulse Readings from Last 1 Encounters:  03/25/22 89         Passed - Valid encounter within last 6 months    Recent Outpatient Visits           3 months ago Controlled type 2 diabetes mellitus with hyperglycemia, without long-term current use of insulin (HCC)   Primary Care at [facility], Cari S, PA-C   10 months ago Essential hypertension   [facility] Storm Frisk, MD   1 year ago Dysuria   [facility] Storm Frisk, MD   2 years ago Chronic pain of right ankle   [facility] Marcine Matar, MD   2 years ago Essential hypertension   [facility] Storm Frisk, MD       Future Appointments             In 3 weeks Storm Frisk, MD [facility]             bisoprolol (ZEBETA) 10 MG tablet 30 tablet 0    Sig: Take 1 tablet (10 mg total) by mouth daily.     Cardiovascular: Beta Blockers 2 Failed - 05/06/2022  1:17 PM      Failed - Last BP in normal range    BP Readings from Last 1 Encounters:  03/25/22 (!) 150/94         Passed - Cr in normal range and within 360 days    Creat  Date Value Ref Range Status  12/04/2015 1.15 0.70 - 1.33 mg/dL Final    Comment:      For  patients > or = 57 years of age: The upper reference limit for Creatinine is approximately 13% higher for people identified as African-American.      Creatinine, Ser  Date Value Ref Range Status  03/25/2022 1.13 0.61 - 1.24 mg/dL Final         Passed - Last Heart Rate in normal range    Pulse Readings from Last 1 Encounters:  03/25/22 89         Passed - Valid encounter within last 6 months    Recent Outpatient Visits           3 months ago Controlled type 2 diabetes mellitus with hyperglycemia, without long-term current use of insulin [facility])   Primary Care at [facility], Cari S, PA-C   10 months ago Essential hypertension   [facility] Storm Frisk, MD   1 year ago Dysuria   [facility]  Elsie Stain, MD   2 years ago Chronic pain of right ankle   [place], MD   2 years ago Essential hypertension   [place], MD       Future Appointments             In 3 weeks Elsie Stain, MD [place]            Refused Prescriptions Disp Refills   atorvastatin (LIPITOR) 20 MG tablet 30 tablet 0    Sig: Take 1 tablet (20 mg total) by mouth daily.     Cardiovascular:  Antilipid - Statins Failed - 05/06/2022  1:17 PM      Failed - Lipid Panel in normal range within the last 12 months    Cholesterol, Total  Date Value Ref Range Status  08/14/2020 185 100 - 199 mg/dL Final   Cholesterol  Date Value Ref Range Status  03/24/2022 124 0 - 200 mg/dL Final   LDL Chol Calc (NIH)  Date Value Ref Range Status  08/14/2020 119 (H) 0 - 99 mg/dL Final   LDL Cholesterol  Date Value Ref Range Status  03/24/2022 82 0 - 99 mg/dL Final    Comment:           Total Cholesterol/HDL:CHD Risk Coronary Heart Disease Risk Table                     Men   Women  1/2 Average  Risk   3.4   3.3  Average Risk       5.0   4.4  2 X Average Risk   9.6   7.1  3 X Average Risk  23.4   11.0        Use the calculated Patient Ratio above and the CHD Risk Table to determine the patient's CHD Risk.        ATP III CLASSIFICATION (LDL):  <100     mg/dL   Optimal  100-129  mg/dL   Near or Above                    Optimal  130-159  mg/dL   Borderline  160-189  mg/dL   High  >190     mg/dL   Very High Performed at [facility] [address] [place], [place] [address]    HDL  Date Value Ref Range Status  03/24/2022 34 (L) >40 mg/dL Final  08/14/2020 46 >39 mg/dL Final   Triglycerides  Date Value Ref Range Status  03/24/2022 40 <150 mg/dL Final         Passed - Patient is not pregnant      Passed - Valid encounter within last 12 months    Recent Outpatient Visits           3 months ago Controlled type 2 diabetes mellitus with hyperglycemia, without long-term current use of insulin ([place])   Primary Care at [facility], Cari S, PA-C   10 months ago Essential hypertension   [place], Patrick E, MD   1 year ago [place] Elsie Stain, MD   2 years ago Chronic pain of right ankle   [place], Deborah B, MD   2 years ago Essential  hypertension   [place], MD       Future Appointments             In 3 weeks Elsie Stain, MD [place]

## 2022-05-07 NOTE — Telephone Encounter (Signed)
Requested Prescriptions  Pending Prescriptions Disp Refills   amLODipine (NORVASC) 10 MG tablet 30 tablet 0    Sig: Take 1 tablet (10 mg total) by mouth daily.     Cardiovascular: Calcium Channel Blockers 2 Failed - 05/06/2022  1:17 PM      Failed - Last BP in normal range    BP Readings from Last 1 Encounters:  03/25/22 (!) 150/94         Passed - Last Heart Rate in normal range    Pulse Readings from Last 1 Encounters:  03/25/22 89         Passed - Valid encounter within last 6 months    Recent Outpatient Visits           3 months ago Controlled type 2 diabetes mellitus with hyperglycemia, without long-term current use of insulin (HCC)   Primary Care at [facility], Cari S, PA-C   10 months ago Essential hypertension   [facility] Storm Frisk, MD   1 year ago Dysuria   [facility] Storm Frisk, MD   2 years ago Chronic pain of right ankle   [facility] Marcine Matar, MD   2 years ago Essential hypertension   [facility] Storm Frisk, MD       Future Appointments             In 3 weeks Storm Frisk, MD [facility]             atorvastatin (LIPITOR) 20 MG tablet 30 tablet 0    Sig: Take 1 tablet (20 mg total) by mouth daily.     Cardiovascular:  Antilipid - Statins Failed - 05/06/2022  1:17 PM      Failed - Lipid Panel in normal range within the last 12 months    Cholesterol, Total  Date Value Ref Range Status  08/14/2020 185 100 - 199 mg/dL Final   Cholesterol  Date Value Ref Range Status  03/24/2022 124 0 - 200 mg/dL Final   LDL Chol Calc (NIH)  Date Value Ref Range Status  08/14/2020 119 (H) 0 - 99 mg/dL Final   LDL Cholesterol  Date Value Ref Range Status  03/24/2022 82 0 - 99 mg/dL Final    Comment:           Total Cholesterol/HDL:CHD Risk Coronary Heart Disease  Risk Table                     Men   Women  1/2 Average Risk   3.4   3.3  Average Risk       5.0   4.4  2 X Average Risk   9.6   7.1  3 X Average Risk  23.4   11.0        Use the calculated Patient Ratio above and the CHD Risk Table to determine the patient's CHD Risk.        ATP III CLASSIFICATION (LDL):  <100     mg/dL   Optimal  938-101  mg/dL   Near or Above                    Optimal  130-159  mg/dL   Borderline  751-025  mg/dL   High  >852     mg/dL   Very High Performed  at [facility], [address] [place], [place] [address]    HDL  Date Value Ref Range Status  03/24/2022 34 (L) >40 mg/dL Final  04/20/2535 46 >64 mg/dL Final   Triglycerides  Date Value Ref Range Status  03/24/2022 40 <150 mg/dL Final         Passed - Patient is not pregnant      Passed - Valid encounter within last 12 months    Recent Outpatient Visits           3 months ago Controlled type 2 diabetes mellitus with hyperglycemia, without long-term current use of insulin (HCC)   Primary Care at [facility], Cari S, PA-C   10 months ago Essential hypertension   [facility] Storm Frisk, MD   1 year ago Dysuria   [facility] Storm Frisk, MD   2 years ago Chronic pain of right ankle   [facility] Marcine Matar, MD   2 years ago Essential hypertension   [place] Storm Frisk, MD       Future Appointments             In 3 weeks Storm Frisk, MD [facility]             bisoprolol (ZEBETA) 10 MG tablet 30 tablet 0    Sig: Take 1 tablet (10 mg total) by mouth daily.     Cardiovascular: Beta Blockers 2 Failed - 05/06/2022  1:17 PM      Failed - Last BP in normal range    BP Readings from Last 1 Encounters:  03/25/22 (!) 150/94         Passed - Cr in normal range and within 360 days     Creat  Date Value Ref Range Status  12/04/2015 1.15 0.70 - 1.33 mg/dL Final    Comment:      For patients > or = 58 years of age: The upper reference limit for Creatinine is approximately 13% higher for people identified as African-American.      Creatinine, Ser  Date Value Ref Range Status  03/25/2022 1.13 0.61 - 1.24 mg/dL Final         Passed - Last Heart Rate in normal range    Pulse Readings from Last 1 Encounters:  03/25/22 89         Passed - Valid encounter within last 6 months    Recent Outpatient Visits           3 months ago Controlled type 2 diabetes mellitus with hyperglycemia, without long-term current use of insulin (HCC)   Primary Care at [facility], Cari S, PA-C   10 months ago Essential hypertension   [facility] [facility] Storm Frisk, MD   1 year ago Dysuria   [facility] Storm Frisk, MD   2 years ago Chronic pain of right ankle   [facility] Marcine Matar, MD   2 years ago Essential hypertension   [facility] [facility] Storm Frisk, MD       Future Appointments             In 3 weeks Storm Frisk, MD [facility]

## 2022-05-22 ENCOUNTER — Ambulatory Visit: Payer: Medicare Other | Attending: Critical Care Medicine

## 2022-05-22 VITALS — Ht 71.0 in | Wt 250.0 lb

## 2022-05-22 DIAGNOSIS — Z0000 Encounter for general adult medical examination without abnormal findings: Secondary | ICD-10-CM | POA: Diagnosis not present

## 2022-05-22 NOTE — Progress Notes (Signed)
I connected with Brian Moody today by telephone and verified that I am speaking with the correct person using two identifiers. Location patient: home Location provider: work Persons participating in the virtual visit: Wess, Baney LPN.   I discussed the limitations, risks, security and privacy concerns of performing an evaluation and management service by telephone and the availability of in person appointments. I also discussed with the patient that there may be a patient responsible charge related to this service. The patient expressed understanding and verbally consented to this telephonic visit.    Interactive audio and video telecommunications were attempted between this provider and patient, however failed, due to patient having technical difficulties OR patient did not have access to video capability.  We continued and completed visit with audio only.     Vital signs may be patient reported or missing.  Subjective:   Brian Moody. is a 56 y.o. male who presents for Medicare Annual/Subsequent preventive examination.  Review of Systems     Cardiac Risk Factors include: advanced age (>76mn, >>31women);diabetes mellitus;dyslipidemia;hypertension;male gender;obesity (BMI >30kg/m2)     Objective:    Today's Vitals   05/22/22 1616 05/22/22 1618  Weight: 250 lb (113.4 kg)   Height: _0  (1.803 m)   PainSc:  6    Body mass index is 34.87 kg/m.     05/22/2022    4:24 PM 03/23/2022   11:53 PM 02/24/2021   10:08 AM 10/17/2016   11:45 AM 07/19/2016    3:32 PM 07/01/2016    1:33 PM 04/18/2016    3:06 PM  Advanced Directives  Does Patient Have a Medical Advance Directive? _1  No No  Would patient like information on creating a medical advance directive?   Yes (MAU/Ambulatory/Procedural Areas - Information given)  Yes (ED - Information included in AVS)  No - patient declined information    Current Medications (verified) Outpatient Encounter  Medications as of 05/22/2022  Medication Sig   acetaminophen (TYLENOL) 500 MG tablet Take 2 tablets (1,000 mg total) by mouth every 6 (six) hours as needed for headache.   amLODipine (NORVASC) 10 MG tablet Take 1 tablet (10 mg total) by mouth daily.   atorvastatin (LIPITOR) 10 MG tablet Take 1 tablet by mouth every day   atorvastatin (LIPITOR) 20 MG tablet Take 1 tablet (20 mg total) by mouth daily.   bisoprolol (ZEBETA) 10 MG tablet Take 1 tablet (10 mg total) by mouth daily.   cyclobenzaprine (FLEXERIL) 5 MG tablet Take 1 tablet (5 mg total) by mouth 3 (three) times daily as needed for muscle spasms.   ELIQUIS 5 MG TABS tablet Take 1 tablet by mouth twice daily. Appt needed (Patient taking differently: Take 5 mg by mouth 2 (two) times daily.)   metFORMIN (GLUCOPHAGE) 500 MG tablet Take 1 tablet (500 mg total) by mouth daily with breakfast.   polyvinyl alcohol (LIQUIFILM TEARS) 1.4 % ophthalmic solution Place 1 drop into both eyes as needed for dry eyes.   Vitamin D, Ergocalciferol, (DRISDOL) 1.25 MG (50000 UNIT) CAPS capsule Take 1 capsule (50,000 Units total) by mouth every 7 (seven) days.   blood glucose meter kit and supplies KIT Dispense based on patient and insurance preference. Use up to four times daily as directed. (FOR ICD-9 250.00, 250.01). (Patient not taking: Reported on 01/09/2022)   Blood Pressure Monitoring (5 SERIES BP MONITOR) DEVI 1 Units by Does not apply route daily. Measure blood pressure daily (Patient not taking:  Reported on 05/22/2022)   [DISCONTINUED] carvedilol (COREG) 12.5 MG tablet Take 1 tablet (12.5 mg total) by mouth 2 (two) times daily with a meal.   No facility-administered encounter medications on file as of 05/22/2022.    Allergies (verified) Patient has no known allergies.   History: Past Medical History:  Diagnosis Date   Avulsion fracture of distal fibula 06/02/2013   Right distal fibula occurred 04/30/2013    Dysuria 08/14/2020   Hordeolum externum  left upper eyelid 06/29/2019   Hypertension    Persistent atrial fibrillation ([place])    a. newly diagnosed on 05/2015 admission. started on coumadin.    Pre-diabetes    a. HgA1c 6.3 05/2015   Tobacco abuse    Past Surgical History:  Procedure Laterality Date   IR CT HEAD LTD  03/24/2022   IR PERCUTANEOUS ART THROMBECTOMY/INFUSION INTRACRANIAL INC DIAG ANGIO  03/24/2022   IR US GUIDE VASC ACCESS RIGHT  03/24/2022   RADIOLOGY WITH ANESTHESIA N/A 03/24/2022   Procedure: IR WITH ANESTHESIA;  Surgeon: Radiologist, Medication, MD;  Location: [place];  Service: Radiology;  Laterality: N/A;   Family History  Problem Relation Age of Onset   Hypertension Father    Diabetes Maternal Grandmother    Hypertension Maternal Grandmother    Diabetes Maternal Grandfather    Hypertension Maternal Grandfather    Social History   Socioeconomic History   Marital status: Married    Spouse name: Not on file   Number of children: Not on file   Years of education: Not on file   Highest education level: Not on file  Occupational History   Not on file  Tobacco Use   Smoking status: Former    Packs/day: 0.10    Types: Cigarettes    Passive exposure: Never   Smokeless tobacco: Never  Vaping Use   Vaping Use: Never used  Substance and Sexual Activity   Alcohol use: No   Drug use: No   Sexual activity: Not Currently  Other Topics Concern   Not on file  Social History Narrative   Not on file   Social Determinants of Health   Financial Resource Strain: Low Risk  (05/22/2022)   Overall Financial Resource Strain (CARDIA)    Difficulty of Paying Living Expenses: Not hard at all  Food Insecurity: No Food Insecurity (05/22/2022)   Hunger Vital Sign    Worried About Running Out of Food in the Last Year: Never true    Cable in the Last Year: Never true  Transportation Needs: No Transportation Needs (05/22/2022)   PRAPARE - Hydrologist (Medical): No    Lack of  Transportation (Non-Medical): No  Physical Activity: Inactive (05/22/2022)   Exercise Vital Sign    Days of Exercise per Week: 0 days    Minutes of Exercise per Session: 0 min  Stress: No Stress Concern Present (05/22/2022)   [place]    Feeling of Stress : Only a little  Social Connections: Not on file    Tobacco Counseling Counseling given: Not Answered   Clinical Intake:  Pre-visit preparation completed: Yes  Pain : 0-10 Pain Score: 6  Pain Type: Chronic pain Pain Location: Back Pain Descriptors / Indicators: Aching Pain Onset: More than a month ago Pain Frequency: Intermittent     Nutritional Status: BMI > 30  Obese Nutritional Risks: None Diabetes: Yes  How often do you need to have someone help  you when you read instructions, pamphlets, or other written materials from your doctor or pharmacy?: 1 - Never  Diabetic? Yes Nutrition Risk Assessment:  Has the patient had any N/V/D within the last 2 months?  No  Does the patient have any non-healing wounds?  No  Has the patient had any unintentional weight loss or weight gain?  No   Diabetes:  Is the patient diabetic?  Yes  If diabetic, was a CBG obtained today?  No  Did the patient bring in their glucometer from home?  No  How often do you monitor your CBG's? none.   Financial Strains and Diabetes Management:  Are you having any financial strains with the device, your supplies or your medication? No .  Does the patient want to be seen by Chronic Care Management for management of their diabetes?  No  Would the patient like to be referred to a Nutritionist or for Diabetic Management?  No   Diabetic Exams:  Diabetic Eye Exam: Overdue for diabetic eye exam. Pt has been advised about the importance in completing this exam. Patient advised to call and schedule an eye exam. Diabetic Foot Exam: Overdue, Pt has been advised about the importance in  completing this exam. Pt is scheduled for diabetic foot exam on next appointment.      Information entered by :: NAllen LPN   Activities of Daily Living    05/22/2022    4:25 PM  In your present state of health, do you have any difficulty performing the following activities:  Hearing? 0  Vision? 0  Difficulty concentrating or making decisions? 0  Walking or climbing stairs? 0  Dressing or bathing? 0  Doing errands, shopping? 0  Preparing Food and eating ? N  Using the Toilet? N  In the past six months, have you accidently leaked urine? N  Do you have problems with loss of bowel control? N  Managing your Medications? N  Managing your Finances? N  Housekeeping or managing your Housekeeping? N    Patient Care Team: Elsie Stain, MD as PCP - General (Pulmonary Disease)  Indicate any recent Medical Services you may have received from other than [facility] providers in the past year (date may be approximate).     Assessment:   This is a routine wellness examination for [place].  Hearing/Vision screen Vision Screening - Comments:: No regular eye exams  Dietary issues and exercise activities discussed: Current Exercise Habits: The patient does not participate in regular exercise at present   Goals Addressed             This Visit's Progress    Patient Stated       05/22/2022, losing weight       Depression Screen    05/22/2022    4:25 PM 01/09/2022    2:26 PM 07/02/2021   11:31 AM 02/24/2021   10:02 AM 02/24/2021    9:57 AM 08/14/2020    2:27 PM 05/05/2020    2:24 PM  PHQ 2/9 Scores  PHQ - 2 Score 0 0 0 0 0  0  PHQ- 9 Score  2       Exception Documentation      Patient refusal     Fall Risk    05/22/2022    4:24 PM 07/02/2021   11:31 AM 02/24/2021    9:57 AM 05/05/2020    1:47 PM 03/02/2020    9:42 AM  Fall Risk   Falls in the past year? 0  0 0 1 1  Number falls in past yr: 0 0 0 1 1  Injury with Fall? 0 0 0 0 0  Risk for fall due to : Medication side effect  No Fall Risks   Impaired mobility;History of fall(s)  Follow up Falls prevention discussed;Education provided;Falls evaluation completed  Falls evaluation completed  Education provided    FALL RISK PREVENTION PERTAINING TO THE HOME:  Any stairs in or around the home? No  If so, are there any without handrails?  N/a Home free of loose throw rugs in walkways, pet beds, electrical cords, etc? Yes  Adequate lighting in your home to reduce risk of falls? Yes   ASSISTIVE DEVICES UTILIZED TO PREVENT FALLS:  Life alert? No  Use of a cane, walker or w/c? Yes  Grab bars in the bathroom? No  Shower chair or bench in shower? No  Elevated toilet seat or a handicapped toilet? No   TIMED UP AND GO:  Was the test performed? No .      Cognitive Function:        05/22/2022    4:25 PM 02/24/2021   10:05 AM  6CIT Screen  What Year? 0 points 0 points  What month? 0 points 0 points  What time? 3 points 0 points  Count back from 20 0 points 0 points  Months in reverse 4 points 0 points  Repeat phrase 4 points   Total Score 11 points     Immunizations Immunization History  Administered Date(s) Administered   Moderna Covid-19 Vaccine Bivalent Booster 58yr & up 01/14/2022    TDAP status: Due, Education has been provided regarding the importance of this vaccine. Advised may receive this vaccine at local pharmacy or Health Dept. Aware to provide a copy of the vaccination record if obtained from local pharmacy or Health Dept. Verbalized acceptance and understanding.  Flu Vaccine status: Declined, Education has been provided regarding the importance of this vaccine but patient still declined. Advised may receive this vaccine at local pharmacy or Health Dept. Aware to provide a copy of the vaccination record if obtained from local pharmacy or Health Dept. Verbalized acceptance and understanding.  Pneumococcal vaccine status: Declined,  Education has been provided regarding the importance of this  vaccine but patient still declined. Advised may receive this vaccine at local pharmacy or Health Dept. Aware to provide a copy of the vaccination record if obtained from local pharmacy or Health Dept. Verbalized acceptance and understanding.   Covid-19 vaccine status: Declined, Education has been provided regarding the importance of this vaccine but patient still declined. Advised may receive this vaccine at local pharmacy or Health Dept.or vaccine clinic. Aware to provide a copy of the vaccination record if obtained from local pharmacy or Health Dept. Verbalized acceptance and understanding.  Qualifies for Shingles Vaccine? Yes   Zostavax completed No   Shingrix Completed?: No.    Education has been provided regarding the importance of this vaccine. Patient has been advised to call insurance company to determine out of pocket expense if they have not yet received this vaccine. Advised may also receive vaccine at local pharmacy or Health Dept. Verbalized acceptance and understanding.  Screening Tests Health Maintenance  Topic Date Due   FOOT EXAM  Never done   OPHTHALMOLOGY EXAM  Never done   DTaP/Tdap/Td (1 - Tdap) Never done   COLON CANCER SCREENING ANNUAL FOBT  Never done   Zoster Vaccines- Shingrix (1 of 2) Never done   INFLUENZA VACCINE  Never done   Medicare Annual Wellness (AWV)  02/24/2022   HEMOGLOBIN A1C  09/23/2022   Diabetic kidney evaluation - Urine ACR  01/10/2023   Diabetic kidney evaluation - GFR measurement  03/26/2023   HIV Screening  Completed   HPV VACCINES  Aged Out   COLONOSCOPY (Pts 45-2yr Insurance coverage will need to be confirmed)  Discontinued   COVID-19 Vaccine  Discontinued   Hepatitis C Screening  Discontinued    Health Maintenance  Health Maintenance Due  Topic Date Due   FOOT EXAM  Never done   OPHTHALMOLOGY EXAM  Never done   DTaP/Tdap/Td (1 - Tdap) Never done   COLON CANCER SCREENING ANNUAL FOBT  Never done   Zoster Vaccines- Shingrix (1 of 2)  Never done   INFLUENZA VACCINE  Never done   Medicare Annual Wellness (AWV)  02/24/2022    Colorectal cancer screening: due   Lung Cancer Screening: (Low Dose CT Chest recommended if Age 57-80years, 30 pack-year currently smoking OR have quit w/in 15years.) does not qualify.   Lung Cancer Screening Referral:   Additional Screening:  Hepatitis C Screening: does qualify;   Vision Screening: Recommended annual ophthalmology exams for early detection of glaucoma and other disorders of the eye. Is the patient up to date with their annual eye exam?  No  Who is the provider or what is the name of the office in which the patient attends annual eye exams? none If pt is not established with a provider, would they like to be referred to a provider to establish care? No .   Dental Screening: Recommended annual dental exams for proper oral hygiene  Community Resource Referral / Chronic Care Management: CRR required this visit?  No   CCM required this visit?  No      Plan:     I have personally reviewed and noted the following in the patient's chart:   Medical and social history Use of alcohol, tobacco or illicit drugs  Current medications and supplements including opioid prescriptions. Patient is not currently taking opioid prescriptions. Functional ability and status Nutritional status Physical activity Advanced directives List of other physicians Hospitalizations, surgeries, and ER visits in previous 12 months Vitals Screenings to include cognitive, depression, and falls Referrals and appointments  In addition, I have reviewed and discussed with patient certain preventive protocols, quality metrics, and best practice recommendations. A written personalized care plan for preventive services as well as general preventive health recommendations were provided to patient.     NKellie Simmering LPN   [id number]  Nurse Notes: none  Due to this being a virtual visit, the after  visit summary with patients personalized plan was offered to patient via mail or my-chart.  to pick up at office at next visit

## 2022-05-22 NOTE — Patient Instructions (Signed)
Brian Moody , Thank you for taking time to come for your Medicare Wellness Visit. I appreciate your ongoing commitment to your health goals. Please review the following plan we discussed and let me know if I can assist you in the future.   Screening recommendations/referrals: Colonoscopy: due Recommended yearly ophthalmology/optometry visit for glaucoma screening and checkup Recommended yearly dental visit for hygiene and checkup  Vaccinations: Influenza vaccine: decline Pneumococcal vaccine: decline Tdap vaccine: decline Shingles vaccine: decline   Covid-19: decline  Advanced directives: Advance directive discussed with you today. .  Conditions/risks identified: none  Next appointment: Follow up in one year for your annual wellness visit   Preventive Care 40-64 Years, Male Preventive care refers to lifestyle choices and visits with your health care provider that can promote health and wellness. What does preventive care include? A yearly physical exam. This is also called an annual well check. Dental exams once or twice a year. Routine eye exams. Ask your health care provider how often you should have your eyes checked. Personal lifestyle choices, including: Daily care of your teeth and gums. Regular physical activity. Eating a healthy diet. Avoiding tobacco and drug use. Limiting alcohol use. Practicing safe sex. Taking low-dose aspirin every day starting at age 79. What happens during an annual well check? The services and screenings done by your health care provider during your annual well check will depend on your age, overall health, lifestyle risk factors, and family history of disease. Counseling  Your health care provider may ask you questions about your: Alcohol use. Tobacco use. Drug use. Emotional well-being. Home and relationship well-being. Sexual activity. Eating habits. Work and work Astronomer. Screening  You may have the following tests or  measurements: Height, weight, and BMI. Blood pressure. Lipid and cholesterol levels. These may be checked every 5 years, or more frequently if you are over 35 years old. Skin check. Lung cancer screening. You may have this screening every year starting at age 64 if you have a 30-pack-year history of smoking and currently smoke or have quit within the past 15 years. Fecal occult blood test (FOBT) of the stool. You may have this test every year starting at age 52. Flexible sigmoidoscopy or colonoscopy. You may have a sigmoidoscopy every 5 years or a colonoscopy every 10 years starting at age 64. Prostate cancer screening. Recommendations will vary depending on your family history and other risks. Hepatitis C blood test. Hepatitis B blood test. Sexually transmitted disease (STD) testing. Diabetes screening. This is done by checking your blood sugar (glucose) after you have not eaten for a while (fasting). You may have this done every 1-3 years. Discuss your test results, treatment options, and if necessary, the need for more tests with your health care provider. Vaccines  Your health care provider may recommend certain vaccines, such as: Influenza vaccine. This is recommended every year. Tetanus, diphtheria, and acellular pertussis (Tdap, Td) vaccine. You may need a Td booster every 10 years. Zoster vaccine. You may need this after age 80. Pneumococcal 13-valent conjugate (PCV13) vaccine. You may need this if you have certain conditions and have not been vaccinated. Pneumococcal polysaccharide (PPSV23) vaccine. You may need one or two doses if you smoke cigarettes or if you have certain conditions. Talk to your health care provider about which screenings and vaccines you need and how often you need them. This information is not intended to replace advice given to you by your health care provider. Make sure you discuss any questions you have  with your health care provider. Document Released:  07/07/2015 Document Revised: 02/28/2016 Document Reviewed: 04/11/2015 Elsevier Interactive Patient Education  2017 [place] Prevention in the Home Falls can cause injuries. They can happen to people of all ages. There are many things you can do to make your home safe and to help prevent falls. What can I do on the outside of my home? Regularly fix the edges of walkways and driveways and fix any cracks. Remove anything that might make you trip as you walk through a door, such as a raised step or threshold. Trim any bushes or trees on the path to your home. Use bright outdoor lighting. Clear any walking paths of anything that might make someone trip, such as rocks or tools. Regularly check to see if handrails are loose or broken. Make sure that both sides of any steps have handrails. Any raised decks and porches should have guardrails on the edges. Have any leaves, snow, or ice cleared regularly. Use sand or salt on walking paths during winter. Clean up any spills in your garage right away. This includes oil or grease spills. What can I do in the bathroom? Use night lights. Install grab bars by the toilet and in the tub and shower. Do not use towel bars as grab bars. Use non-skid mats or decals in the tub or shower. If you need to sit down in the shower, use a plastic, non-slip stool. Keep the floor dry. Clean up any water that spills on the floor as soon as it happens. Remove soap buildup in the tub or shower regularly. Attach bath mats securely with double-sided non-slip rug tape. Do not have throw rugs and other things on the floor that can make you trip. What can I do in the bedroom? Use night lights. Make sure that you have a light by your bed that is easy to reach. Do not use any sheets or blankets that are too big for your bed. They should not hang down onto the floor. Have a firm chair that has side arms. You can use this for support while you get dressed. Do not have  throw rugs and other things on the floor that can make you trip. What can I do in the kitchen? Clean up any spills right away. Avoid walking on wet floors. Keep items that you use a lot in easy-to-reach places. If you need to reach something above you, use a strong step stool that has a grab bar. Keep electrical cords out of the way. Do not use floor polish or wax that makes floors slippery. If you must use wax, use non-skid floor wax. Do not have throw rugs and other things on the floor that can make you trip. What can I do with my stairs? Do not leave any items on the stairs. Make sure that there are handrails on both sides of the stairs and use them. Fix handrails that are broken or loose. Make sure that handrails are as long as the stairways. Check any carpeting to make sure that it is firmly attached to the stairs. Fix any carpet that is loose or worn. Avoid having throw rugs at the top or bottom of the stairs. If you do have throw rugs, attach them to the floor with carpet tape. Make sure that you have a light switch at the top of the stairs and the bottom of the stairs. If you do not have them, ask someone to add them for you. What  else can I do to help prevent falls? Wear shoes that: Do not have high heels. Have rubber bottoms. Are comfortable and fit you well. Are closed at the toe. Do not wear sandals. If you use a stepladder: Make sure that it is fully opened. Do not climb a closed stepladder. Make sure that both sides of the stepladder are locked into place. Ask someone to hold it for you, if possible. Clearly mark and make sure that you can see: Any grab bars or handrails. First and last steps. Where the edge of each step is. Use tools that help you move around (mobility aids) if they are needed. These include: Canes. Walkers. Scooters. Crutches. Turn on the lights when you go into a dark area. Replace any light bulbs as soon as they burn out. Set up your furniture so  you have a clear path. Avoid moving your furniture around. If any of your floors are uneven, fix them. If there are any pets around you, be aware of where they are. Review your medicines with your doctor. Some medicines can make you feel dizzy. This can increase your chance of falling. Ask your doctor what other things that you can do to help prevent falls. This information is not intended to replace advice given to you by your health care provider. Make sure you discuss any questions you have with your health care provider. Document Released: 04/06/2009 Document Revised: 11/16/2015 Document Reviewed: 07/15/2014 Elsevier Interactive Patient Education  2017 [employer].

## 2022-05-29 ENCOUNTER — Ambulatory Visit: Payer: Medicare Other | Admitting: Physician Assistant

## 2022-05-30 ENCOUNTER — Encounter: Payer: Self-pay | Admitting: Critical Care Medicine

## 2022-05-30 ENCOUNTER — Telehealth: Payer: Self-pay

## 2022-05-30 ENCOUNTER — Other Ambulatory Visit: Payer: Self-pay

## 2022-05-30 ENCOUNTER — Ambulatory Visit: Payer: Medicare Other | Attending: Physician Assistant | Admitting: Critical Care Medicine

## 2022-05-30 VITALS — BP 133/95 | HR 85 | Temp 97.9°F | Ht 70.0 in | Wt 249.8 lb

## 2022-05-30 DIAGNOSIS — I482 Chronic atrial fibrillation, unspecified: Secondary | ICD-10-CM

## 2022-05-30 DIAGNOSIS — E7849 Other hyperlipidemia: Secondary | ICD-10-CM

## 2022-05-30 DIAGNOSIS — E669 Obesity, unspecified: Secondary | ICD-10-CM

## 2022-05-30 DIAGNOSIS — I10 Essential (primary) hypertension: Secondary | ICD-10-CM | POA: Diagnosis not present

## 2022-05-30 DIAGNOSIS — I63411 Cerebral infarction due to embolism of right middle cerebral artery: Secondary | ICD-10-CM

## 2022-05-30 DIAGNOSIS — Z87891 Personal history of nicotine dependence: Secondary | ICD-10-CM | POA: Diagnosis not present

## 2022-05-30 DIAGNOSIS — E559 Vitamin D deficiency, unspecified: Secondary | ICD-10-CM

## 2022-05-30 DIAGNOSIS — E1165 Type 2 diabetes mellitus with hyperglycemia: Secondary | ICD-10-CM | POA: Diagnosis not present

## 2022-05-30 DIAGNOSIS — L309 Dermatitis, unspecified: Secondary | ICD-10-CM | POA: Insufficient documentation

## 2022-05-30 DIAGNOSIS — M545 Low back pain, unspecified: Secondary | ICD-10-CM

## 2022-05-30 LAB — GLUCOSE, POCT (MANUAL RESULT ENTRY): POC Glucose: 122 mg/dl — AB (ref 70–99)

## 2022-05-30 MED ORDER — DICLOFENAC SODIUM 1 % EX GEL: 2.0000 g | Freq: Four times a day (QID) | CUTANEOUS | 1 refills | Status: AC

## 2022-05-30 MED ORDER — VITAMIN D (ERGOCALCIFEROL) 1.25 MG (50000 UNIT) PO CAPS: 50000.0000 [IU] | ORAL_CAPSULE | ORAL | 2 refills | Status: DC

## 2022-05-30 MED ORDER — ATORVASTATIN CALCIUM 20 MG PO TABS: 20.0000 mg | ORAL_TABLET | Freq: Every day | ORAL | 2 refills | Status: DC

## 2022-05-30 MED ORDER — APIXABAN 5 MG PO TABS: 5.0000 mg | ORAL_TABLET | Freq: Two times a day (BID) | ORAL | 2 refills | Status: DC

## 2022-05-30 MED ORDER — AMLODIPINE BESYLATE 10 MG PO TABS: 10.0000 mg | ORAL_TABLET | Freq: Every day | ORAL | 2 refills | Status: DC

## 2022-05-30 MED ORDER — CLOBETASOL PROPIONATE 0.05 % EX CREA: 1.0000 | TOPICAL_CREAM | Freq: Two times a day (BID) | CUTANEOUS | 0 refills | Status: DC

## 2022-05-30 MED ORDER — METFORMIN HCL 500 MG PO TABS: 500.0000 mg | ORAL_TABLET | Freq: Every day | ORAL | 1 refills | Status: DC

## 2022-05-30 MED ORDER — BISOPROLOL FUMARATE 10 MG PO TABS: 10.0000 mg | ORAL_TABLET | Freq: Every day | ORAL | 2 refills | Status: DC

## 2022-05-30 MED ORDER — CYCLOBENZAPRINE HCL 5 MG PO TABS: 5.0000 mg | ORAL_TABLET | Freq: Three times a day (TID) | ORAL | 2 refills | Status: DC | PRN

## 2022-05-30 NOTE — Assessment & Plan Note (Signed)
Diabetes well-controlled continue metformin

## 2022-05-30 NOTE — Assessment & Plan Note (Signed)
History of stroke will refer back to physical therapy maintain Eliquis and get a cane for the patient

## 2022-05-30 NOTE — Addendum Note (Signed)
Addended by: Shan Levans E on: 05/30/2022 02:13 PM   Modules accepted: Orders

## 2022-05-30 NOTE — Telephone Encounter (Signed)
Order received for cane.  I called the patient to inquire if he has a preferred DME company for the cane and he said he wants to get it at [facility]. I explained that he would pay out of pocket if he gets it there.  I also explained that I can send the order to a DME company and they will verify insurance coverage and bill the insurance company if it is covered.  I explained that there is no guarantee that it will be covered but the DME company will let him know any cost to him.  He said he wanted to verify insurance coverage with DME company and he had no preference for DME companies and was agreeable to sending the order to [facility].  Order then faxed to [facility].

## 2022-05-30 NOTE — Assessment & Plan Note (Signed)
No longer smoking 

## 2022-05-30 NOTE — Assessment & Plan Note (Signed)
Eczema of left lower extremity begin Temovate therapy

## 2022-05-30 NOTE — Telephone Encounter (Signed)
Patient stated wanting to know about a cane

## 2022-05-30 NOTE — Assessment & Plan Note (Signed)
Elevated blood pressure not well-controlled plan is to increase medication Increase Bystolic dose and increase amlodipine and have patient return in short-term follow-up

## 2022-05-30 NOTE — Assessment & Plan Note (Signed)
Continue high-dose lipid therapy 

## 2022-05-30 NOTE — Patient Instructions (Signed)
Medications were refilled and sent to your pharmacy  Start Temovate cream twice daily to your left leg  Start Voltaren cream to the right knee twice daily  Letter was produced for disability I will try to pursue disability at this time  Complete set of screening labs will be obtained and urine for protein with your diabetes  Return to Dr. Delford Field 2 months

## 2022-05-30 NOTE — Progress Notes (Signed)
Established Patient Office Visit  Subjective:  Patient ID: Brian Moody., male    DOB: [birth year]  Age: 56 y.o. MRN: [id number]  CC:  Chief Complaint  Patient presents with   Hypertension   Medication Refill    HPI 06/2021 Brian Moody. presents for follow-up of hypertension.  Patient became confused as to whether he should be taking his blood pressure medicines daily or not but tickly when his insurance company sent him a mail order supply of prepackaged medications.  He has not been on his bisoprolol or amlodipine for about 2 weeks and on arrival blood pressure is 148/83.  When he has been compliant with his medicines blood pressure has been well controlled.  He currently does not have any symptoms referable to the blood pressure.  He is also still smoking 5 cigarettes daily.  Patient was in the emergency room last week for acute onset of pyelonephritis with left-sided flank pain.  He is now taking a course of Bactrim and has seen improvement in symptoms.  He is trying to hydrate himself more at this time.  Patient did declined the flu vaccine at this visit  Patient is due colon cancer screening and was sent a Cologuard kit but did not process it properly.  We will have him do a fecal occult kit here in the office.   12/7 patient seen in direct follow-up on arrival blood pressure 133/95 on recheck it remains elevated.  Patient has had a prior stroke involving the cerebellum on the right affecting left side with left-sided weakness and dizziness that is persisting.  Patient is having difficulty with ambulation and would like a cane for this.  He had not yet gotten much physical therapy and needs another referral for this.  He has a rash in the left lower extremity and pain in the right knee.  Patient needs better blood pressure management. Patient does have type 2 diabetes on metformin alone with good control Patient is no longer smoking  Below is a copy of the recent  discharge Adm 02/2022 for CVA: Date of Admission: 03/23/2022 Date of Discharge: 03/25/2022   Attending Physician:  Stroke, Md, MD, Stroke MD Patient's PCP:  Elsie Stain, MD   DISCHARGE DIAGNOSIS:  Principal Problem:   Stroke:  right MCA stroke s/p TNK and IR with TICI2c, likely due to afib not compliant with eliquis   Active Problems:   hyperlipidemia   Essential hypertension   Tobacco use   Obesity (BMI 30-39.9)   Chronic atrial fibrillation (HCC)   Controlled type 2 diabetes mellitus with hyperglycemia   Non-traumatic subconjunctival hemorrhage, bilateral   Medication List       STOP taking these medications     albuterol 108 (90 Base) MCG/ACT inhaler Commonly known as: VENTOLIN HFA    Moderna COVID-19 Bivalent 50 MCG/0.5ML injection Generic drug: COVID-19 mRNA bivalent vaccine (Moderna)           TAKE these medications     5 Series BP Monitor Devi 1 Units by Does not apply route daily. Measure blood pressure daily    acetaminophen 500 MG tablet Commonly known as: TYLENOL Take 2 tablets (1,000 mg total) by mouth every 6 (six) hours as needed for headache.    amLODipine 10 MG tablet Commonly known as: NORVASC Take 1 tablet (10 mg total) by mouth daily. Start taking on: March 26, 2022    atorvastatin 20 MG tablet Commonly known as: LIPITOR Take 1 tablet (20  mg total) by mouth daily. Start taking on: March 26, 2022 What changed:  medication strength how much to take    bisoprolol 10 MG tablet Commonly known as: ZEBETA Take 1 tablet (10 mg total) by mouth daily. Start taking on: March 26, 2022    blood glucose meter kit and supplies Kit Dispense based on patient and insurance preference. Use up to four times daily as directed. (FOR ICD-9 250.00, 250.01).    cyclobenzaprine 5 MG tablet Commonly known as: FLEXERIL Take 1 tablet (5 mg total) by mouth 3 (three) times daily as needed for muscle spasms.    Eliquis 5 MG Tabs tablet Generic drug:  apixaban Take 1 tablet by mouth twice daily. Appt needed What changed: See the new instructions.    metFORMIN 500 MG tablet Commonly known as: GLUCOPHAGE Take 1 tablet (500 mg total) by mouth daily with breakfast.    polyvinyl alcohol 1.4 % ophthalmic solution Commonly known as: LIQUIFILM TEARS Place 1 drop into both eyes as needed for dry eyes.    Vitamin D (Ergocalciferol) 1.25 MG (50000 UNIT) Caps capsule Commonly known as: DRISDOL Take 1 capsule (50,000 Units total) by mouth every 7 (seven) days.             LABORATORY STUDIES CBC Labs (Brief)          Component Value Date/Time    WBC 7.6 03/25/2022 0259    RBC 4.56 03/25/2022 0259    HGB 13.7 03/25/2022 0259    HGB 16.2 08/14/2020 1519    HCT 40.6 03/25/2022 0259    HCT 49.2 08/14/2020 1519    PLT 185 03/25/2022 0259    PLT 244 08/14/2020 1519    MCV 89.0 03/25/2022 0259    MCV 90 08/14/2020 1519    MCH 30.0 03/25/2022 0259    MCHC 33.7 03/25/2022 0259    RDW 14.5 03/25/2022 0259    RDW 13.5 08/14/2020 1519    LYMPHSABS 2.9 03/23/2022 2315    LYMPHSABS 3.2 (H) 08/14/2020 1519    MONOABS 0.8 03/23/2022 2315    EOSABS 0.1 03/23/2022 2315    EOSABS 0.1 08/14/2020 1519    BASOSABS 0.0 03/23/2022 2315    BASOSABS 0.0 08/14/2020 1519      CMP Labs (Brief)          Component Value Date/Time    NA 140 03/25/2022 0259    NA 138 01/09/2022 1443    K 3.4 (L) 03/25/2022 0259    CL 103 03/25/2022 0259    CO2 26 03/25/2022 0259    GLUCOSE 120 (H) 03/25/2022 0259    BUN 8 03/25/2022 0259    BUN 11 01/09/2022 1443    CREATININE 1.13 03/25/2022 0259    CREATININE 1.15 12/04/2015 1731    CALCIUM 8.5 (L) 03/25/2022 0259    PROT 7.6 03/23/2022 2315    PROT 7.1 01/09/2022 1443    ALBUMIN 3.5 03/23/2022 2315    ALBUMIN 4.1 01/09/2022 1443    AST 23 03/23/2022 2315    ALT 15 03/23/2022 2315    ALKPHOS 75 03/23/2022 2315    BILITOT 0.4 03/23/2022 2315    BILITOT <0.2 01/09/2022 1443    GFRNONAA >60 03/25/2022  0259    GFRNONAA 74 12/04/2015 1731    GFRAA 89 08/14/2020 1519    GFRAA 85 12/04/2015 1731      COAGS Recent Labs       Lab Results  Component Value Date    INR 1.1 03/23/2022  INR 1.3 (H) 09/01/2018    INR 1.02 06/23/2015      Lipid Panel Labs (Brief)          Component Value Date/Time    CHOL 124 03/24/2022 0249    CHOL 185 08/14/2020 1519    TRIG 40 03/24/2022 0249    HDL 34 (L) 03/24/2022 0249    HDL 46 08/14/2020 1519    CHOLHDL 3.6 03/24/2022 0249    VLDL 8 03/24/2022 0249    LDLCALC 82 03/24/2022 0249    LDLCALC 119 (H) 08/14/2020 1519      HgbA1C  Recent Labs       Lab Results  Component Value Date    HGBA1C 5.8 (H) 03/24/2022      Urinalysis Labs (Brief)          Component Value Date/Time    COLORURINE AMBER (A) 04/18/2016 1722    APPEARANCEUR Clear 08/14/2020 1519    LABSPEC >=1.030 06/28/2021 1634    PHURINE 5.5 06/28/2021 1634    GLUCOSEU NEGATIVE 06/28/2021 1634    HGBUR TRACE (A) 06/28/2021 1634    BILIRUBINUR SMALL (A) 06/28/2021 1634    BILIRUBINUR Negative 08/14/2020 1519    KETONESUR TRACE (A) 06/28/2021 1634    PROTEINUR NEGATIVE 06/28/2021 1634    UROBILINOGEN 0.2 06/28/2021 1634    NITRITE NEGATIVE 06/28/2021 1634    LEUKOCYTESUR SMALL (A) 06/28/2021 1634      Urine Drug Screen  Labs (Brief)  No results found for: "LABOPIA", "COCAINSCRNUR", "LABBENZ", "AMPHETMU", "THCU", "LABBARB"    Alcohol Level Labs (Brief)          Component Value Date/Time    ETH <10 03/23/2022 [place] COURSE  Mr. Yianni Skilling. is a 56 y.o. male with history of atrial fibrillation on Eliquis with patient having not taken this medication for about two weeks presenting with acute onset dizziness and left sided weakness with a fall.  He was given TNK (he had missed multiple doses of his Eliquis) and taken to IR for mechanical thrombectomy of the right MCA.  TICI 2c flow was achieved    Stroke:  right MCA stroke s/p TNK and IR with TICI2c, likely due to afib not compliant with eliquis Code Stroke CT head No acute abnormality. ASPECTS 10.    CTA head & neck occlusion of distal right M2 IR showed right M3 occlusion x2, status post TICI2c reperfusion MRI R MCA scattered infarcts MRA previously noted occluded distal right M2 now appears patent, and flow is seen in both the superior and inferior M3 branch 2D Echo EF 60 to 65% LDL 82 HgbA1c 5.8 VTE prophylaxis - SCDs Eliquis (apixaban) daily prior to admission (but patient not compliant with medication), now on ASA. Plan to restart eliquis in am Therapy recommendations:  none Disposition:  home   Hypertension Home meds:  amlodipine 10 mg daily, bisoprolol 10 mg daily Stable Long-term BP goal normotensive   Atrial fibrillation Patient has persistent atrial fibrillation Was prescribed Eliquis but had not taken this medication for past two weeks and when he was taken he took only once a day Discussed importance of taking Eliquis as prescribed Now on ASA, will start Eliquis in am   Hyperlipidemia Home meds: Lipitor 10 LDL 82, goal < 70 On atorvastatin 20 mg daily  High intensity statin not indicated as LDL near goal  Continue statin at discharge   Tobacco abuse Current smoker Smoking cessation counseling provided Pt is willing to quit   Other Stroke Risk Factors Obesity, Body mass index is 35.93 kg/m., BMI >/= 30 associated with increased stroke risk, recommend weight loss, diet and exercise as appropriate    Other Active Problems Bilateral eye subconjunctival hemorrhage post TNK       DISCHARGE EXAM Blood pressure (!) 157/113, pulse (!) 109, temperature 98.2 F (36.8 C), temperature source Oral, resp. rate 20, height 5' 10.5" (1.791 m), SpO2 96 %.   Past Medical History:  Diagnosis Date   Avulsion fracture of distal fibula 06/02/2013   Right distal fibula occurred 04/30/2013    Dysuria 08/14/2020    Hordeolum externum left upper eyelid 06/29/2019   Hypertension    Persistent atrial fibrillation ([place])    a. newly diagnosed on 05/2015 admission. started on coumadin.    Pre-diabetes    a. HgA1c 6.3 05/2015   Tobacco abuse     Past Surgical History:  Procedure Laterality Date   IR CT HEAD LTD  03/24/2022   IR PERCUTANEOUS ART THROMBECTOMY/INFUSION INTRACRANIAL INC DIAG ANGIO  03/24/2022   IR US GUIDE VASC ACCESS RIGHT  03/24/2022   RADIOLOGY WITH ANESTHESIA N/A 03/24/2022   Procedure: IR WITH ANESTHESIA;  Surgeon: Radiologist, Medication, MD;  Location: [place];  Service: Radiology;  Laterality: N/A;    Family History  Problem Relation Age of Onset   Hypertension Father    Diabetes Maternal Grandmother    Hypertension Maternal Grandmother    Diabetes Maternal Grandfather    Hypertension Maternal Grandfather     Social History   Socioeconomic History   Marital status: Married    Spouse name: Not on file   Number of children: Not on file   Years of education: Not on file   Highest education level: Not on file  Occupational History   Not on file  Tobacco Use   Smoking status: Former    Packs/day: 0.10    Types: Cigarettes    Passive exposure: Never   Smokeless tobacco: Never  Vaping Use   Vaping Use: Never used  Substance and Sexual Activity   Alcohol use: No   Drug use: No   Sexual activity: Not Currently  Other Topics Concern   Not on file  Social History Narrative   Not on file   Social Determinants of Health   Financial Resource Strain: Low Risk  (05/22/2022)   Overall Financial Resource Strain (CARDIA)    Difficulty of Paying Living Expenses: Not hard at all  Food Insecurity: No Food Insecurity (05/22/2022)   Hunger Vital Sign    Worried About Running Out of Food in the Last Year: Never true    [place] in the Last Year: Never true  Transportation Needs: No Transportation Needs (05/22/2022)   PRAPARE - Hydrologist  (Medical): No    Lack of Transportation (Non-Medical): No  Physical Activity: Inactive (05/22/2022)   Exercise Vital Sign    Days of Exercise per Week: 0 days    Minutes of Exercise per Session: 0 min  Stress: No Stress Concern Present (05/22/2022)   [place]    Feeling of Stress : Only a little  Social Connections: Not on file  Intimate Partner Violence: Not on file    Outpatient Medications Prior to Visit  Medication Sig Dispense Refill   acetaminophen (TYLENOL) 500  MG tablet Take 2 tablets (1,000 mg total) by mouth every 6 (six) hours as needed for headache. 60 tablet 2   blood glucose meter kit and supplies KIT Dispense based on patient and insurance preference. Use up to four times daily as directed. (FOR ICD-9 250.00, 250.01). 1 each 0   Blood Pressure Monitoring (5 SERIES BP MONITOR) DEVI 1 Units by Does not apply route daily. Measure blood pressure daily 1 each 0   polyvinyl alcohol (LIQUIFILM TEARS) 1.4 % ophthalmic solution Place 1 drop into both eyes as needed for dry eyes. 15 mL 0   amLODipine (NORVASC) 10 MG tablet Take 1 tablet (10 mg total) by mouth daily. 30 tablet 0   atorvastatin (LIPITOR) 10 MG tablet Take 1 tablet by mouth every day 90 tablet 3   atorvastatin (LIPITOR) 20 MG tablet Take 1 tablet (20 mg total) by mouth daily. 30 tablet 0   bisoprolol (ZEBETA) 10 MG tablet Take 1 tablet (10 mg total) by mouth daily. 30 tablet 0   cyclobenzaprine (FLEXERIL) 5 MG tablet Take 1 tablet (5 mg total) by mouth 3 (three) times daily as needed for muscle spasms. 30 tablet 0   ELIQUIS 5 MG TABS tablet Take 1 tablet by mouth twice daily. Appt needed (Patient taking differently: Take 5 mg by mouth 2 (two) times daily.) 60 tablet 11   Vitamin D, Ergocalciferol, (DRISDOL) 1.25 MG (50000 UNIT) CAPS capsule Take 1 capsule (50,000 Units total) by mouth every 7 (seven) days. 4 capsule 2   metFORMIN (GLUCOPHAGE) 500 MG  tablet Take 1 tablet (500 mg total) by mouth daily with breakfast. 90 tablet 1   No facility-administered medications prior to visit.    No Known Allergies  ROS Review of Systems  Constitutional: Negative.  Negative for chills, diaphoresis and fever.  HENT: Negative.  Negative for congestion, ear pain, hearing loss, nosebleeds, postnasal drip, rhinorrhea, sinus pressure, sore throat, tinnitus, trouble swallowing and voice change.   Eyes: Negative.  Negative for photophobia and redness.  Respiratory: Negative.  Negative for apnea, cough, choking, chest tightness, shortness of breath, wheezing and stridor.   Cardiovascular: Negative.  Negative for chest pain, palpitations and leg swelling.  Gastrointestinal: Negative.  Negative for abdominal distention, abdominal pain, blood in stool, constipation, diarrhea, nausea and vomiting.  Endocrine: Negative for polydipsia.  Genitourinary: Negative.  Negative for flank pain, frequency, hematuria and urgency.  Musculoskeletal: Negative.  Negative for arthralgias, back pain, myalgias and neck pain.  Skin: Negative.  Negative for rash.  Allergic/Immunologic: Negative.  Negative for environmental allergies and food allergies.  Neurological:  Positive for weakness and light-headedness. Negative for dizziness, tremors, seizures, syncope and headaches.  Hematological: Negative.  Negative for adenopathy. Does not bruise/bleed easily.  Psychiatric/Behavioral: Negative.  Negative for agitation, sleep disturbance and suicidal ideas. The patient is not nervous/anxious.       Objective:    Physical Exam Vitals reviewed.  Constitutional:      Appearance: Normal appearance. He is well-developed. He is obese. He is not diaphoretic.  HENT:     Head: Normocephalic and atraumatic.     Nose: No nasal deformity, septal deviation, mucosal edema or rhinorrhea.     Right Sinus: No maxillary sinus tenderness or frontal sinus tenderness.     Left Sinus: No maxillary  sinus tenderness or frontal sinus tenderness.     Mouth/Throat:     Pharynx: No oropharyngeal exudate.  Eyes:     General: No scleral icterus.    Conjunctiva/sclera:  Conjunctivae normal.     Pupils: Pupils are equal, round, and reactive to light.  Neck:     Thyroid: No thyromegaly.     Vascular: No carotid bruit or JVD.     Trachea: Trachea normal. No tracheal tenderness or tracheal deviation.  Cardiovascular:     Rate and Rhythm: Normal rate and regular rhythm.     Chest Wall: PMI is not displaced.     Pulses: Normal pulses. No decreased pulses.     Heart sounds: Normal heart sounds, S1 normal and S2 normal. Heart sounds not distant. No murmur heard.    No systolic murmur is present.     No diastolic murmur is present.     No friction rub. No gallop. No S3 or S4 sounds.  Pulmonary:     Effort: Pulmonary effort is normal. No tachypnea, accessory muscle usage or respiratory distress.     Breath sounds: Normal breath sounds. No stridor. No decreased breath sounds, wheezing, rhonchi or rales.  Chest:     Chest wall: No tenderness.  Abdominal:     General: Bowel sounds are normal. There is no distension.     Palpations: Abdomen is soft. Abdomen is not rigid.     Tenderness: There is no abdominal tenderness. There is right CVA tenderness. There is no left CVA tenderness, guarding or rebound.     Comments: Right costovertebral angle tenderness is improved from documentation in the emergency room  Musculoskeletal:        General: Normal range of motion.     Cervical back: Normal range of motion and neck supple. No edema, erythema or rigidity. No muscular tenderness. Normal range of motion.     Right lower leg: No edema.     Left lower leg: No edema.  Lymphadenopathy:     Head:     Right side of head: No submental or submandibular adenopathy.     Left side of head: No submental or submandibular adenopathy.     Cervical: No cervical adenopathy.  Skin:    General: Skin is warm and dry.      Coloration: Skin is not pale.     Findings: No rash.     Nails: There is no clubbing.  Neurological:     General: No focal deficit present.     Mental Status: He is alert and oriented to person, place, and time. Mental status is at baseline.     Sensory: No sensory deficit.  Psychiatric:        Mood and Affect: Mood normal.        Speech: Speech normal.        Behavior: Behavior normal.        Thought Content: Thought content normal.   Foot exam normal  BP (!) 133/95   Pulse 85   Temp 97.9 F (36.6 C)   Ht 5' 10" (1.778 m)   Wt 249 lb 12.8 oz (113.3 kg)   SpO2 93%   BMI 35.84 kg/m  Wt Readings from Last 3 Encounters:  05/30/22 249 lb 12.8 oz (113.3 kg)  05/22/22 250 lb (113.4 kg)  01/09/22 254 lb (115.2 kg)     Health Maintenance Due  Topic Date Due   OPHTHALMOLOGY EXAM  Never done   DTaP/Tdap/Td (1 - Tdap) Never done   COLON CANCER SCREENING ANNUAL FOBT  Never done   Zoster Vaccines- Shingrix (1 of 2) Never done    There are no preventive care reminders to display for  this patient.  Lab Results  Component Value Date   TSH 2.678 06/22/2015   Lab Results  Component Value Date   WBC 7.6 03/25/2022   HGB 13.7 03/25/2022   HCT 40.6 03/25/2022   MCV 89.0 03/25/2022   PLT 185 03/25/2022   Lab Results  Component Value Date   NA 140 03/25/2022   K 3.4 (L) 03/25/2022   CO2 26 03/25/2022   GLUCOSE 120 (H) 03/25/2022   BUN 8 03/25/2022   CREATININE 1.13 03/25/2022   BILITOT 0.4 03/23/2022   ALKPHOS 75 03/23/2022   AST 23 03/23/2022   ALT 15 03/23/2022   PROT 7.6 03/23/2022   ALBUMIN 3.5 03/23/2022   CALCIUM 8.5 (L) 03/25/2022   ANIONGAP 11 03/25/2022   EGFR 84 01/09/2022   Lab Results  Component Value Date   CHOL 124 03/24/2022   Lab Results  Component Value Date   HDL 34 (L) 03/24/2022   Lab Results  Component Value Date   LDLCALC 82 03/24/2022   Lab Results  Component Value Date   TRIG 40 03/24/2022   Lab Results  Component Value  Date   CHOLHDL 3.6 03/24/2022   Lab Results  Component Value Date   HGBA1C 5.8 (H) 03/24/2022    SIGNIFICANT DIAGNOSTIC STUDIES  Imaging Results  MR BRAIN WO CONTRAST   Result Date: 03/24/2022 CLINICAL DATA:  Stroke follow-up, status post thrombectomy EXAM: MRI HEAD WITHOUT CONTRAST MRA HEAD WITHOUT CONTRAST TECHNIQUE: Multiplanar, multi-echo pulse sequences of the brain and surrounding structures were acquired without intravenous contrast. Angiographic images of the Circle of Willis were acquired using MRA technique without intravenous contrast. COMPARISON:  No prior MRI, correlation is made with CT head and CTA head and neck 03/23/2022 FINDINGS: MRI HEAD FINDINGS Brain: Restricted diffusion with ADC correlate in the right insula and posteroinferior frontal lobe (series 5, images 76-85), with additional more scattered foci in the right parietal and occipital lobe (series 5, images 71-88). These areas are associated with mildly increased T2 hyperintense signal. No acute hemorrhage, mass, mass effect, or midline shift. No hydrocephalus or extra-axial collection. Punctate foci of hemosiderin deposition in the posteroinferior right frontal lobe (series 14, image 36 and 44), right temporal lobe (series 14, images 26 and 29), and right occipital lobe (series 14, images 28 and 32), which may represent small microhemorrhages or thrombi. Vascular: Please see MRA findings below. Skull and upper cervical spine: Normal marrow signal. Sinuses/Orbits: No acute finding. Other: The mastoids are well aerated. MRA HEAD FINDINGS Anterior circulation: Both internal carotid arteries are patent to the termini, without significant stenosis. Patent left A1. Aplastic right A1. Normal anterior communicating artery. Anterior cerebral arteries are patent to their distal aspects. No M1 stenosis or occlusion. Normal MCA bifurcations. Distal MCA branches perfused and symmetric. The previously noted occluded right M2 now appears  patent, and flow is seen in both the superior and inferior M3 branching from it. Posterior circulation: Vertebral arteries patent to the vertebrobasilar junction without stenosis. Diminutive left V4. Basilar patent to its distal aspect. Superior cerebellar arteries patent bilaterally. Patent P1 segments, diminutive on the right. Near fetal origin of the right PCA from a prominent right posterior communicating artery. The left posterior communicating artery is also patent. PCAs perfused to their distal aspects without stenosis. Anatomic variants: Near fetal origin of the right PCA. IMPRESSION: 1. Acute infarcts in the right insula and posteroinferior frontal lobe, with additional more scattered foci in the right parietal and occipital lobe. 2. The previously noted  occluded distal right M2 now appears patent, and flow is seen in both the superior and inferior M3 branching from it. 3. No intracranial large vessel occlusion or significant stenosis. These results will be called to the ordering clinician or representative by the Radiologist Assistant, and communication documented in the PACS or [employer]. Electronically Signed   By: Merilyn Baba M.D.   On: 03/24/2022 23:29    MR ANGIO HEAD WO CONTRAST   Result Date: 03/24/2022 CLINICAL DATA:  Stroke follow-up, status post thrombectomy EXAM: MRI HEAD WITHOUT CONTRAST MRA HEAD WITHOUT CONTRAST TECHNIQUE: Multiplanar, multi-echo pulse sequences of the brain and surrounding structures were acquired without intravenous contrast. Angiographic images of the Circle of Willis were acquired using MRA technique without intravenous contrast. COMPARISON:  No prior MRI, correlation is made with CT head and CTA head and neck 03/23/2022 FINDINGS: MRI HEAD FINDINGS Brain: Restricted diffusion with ADC correlate in the right insula and posteroinferior frontal lobe (series 5, images 76-85), with additional more scattered foci in the right parietal and occipital lobe (series 5,  images 71-88). These areas are associated with mildly increased T2 hyperintense signal. No acute hemorrhage, mass, mass effect, or midline shift. No hydrocephalus or extra-axial collection. Punctate foci of hemosiderin deposition in the posteroinferior right frontal lobe (series 14, image 36 and 44), right temporal lobe (series 14, images 26 and 29), and right occipital lobe (series 14, images 28 and 32), which may represent small microhemorrhages or thrombi. Vascular: Please see MRA findings below. Skull and upper cervical spine: Normal marrow signal. Sinuses/Orbits: No acute finding. Other: The mastoids are well aerated. MRA HEAD FINDINGS Anterior circulation: Both internal carotid arteries are patent to the termini, without significant stenosis. Patent left A1. Aplastic right A1. Normal anterior communicating artery. Anterior cerebral arteries are patent to their distal aspects. No M1 stenosis or occlusion. Normal MCA bifurcations. Distal MCA branches perfused and symmetric. The previously noted occluded right M2 now appears patent, and flow is seen in both the superior and inferior M3 branching from it. Posterior circulation: Vertebral arteries patent to the vertebrobasilar junction without stenosis. Diminutive left V4. Basilar patent to its distal aspect. Superior cerebellar arteries patent bilaterally. Patent P1 segments, diminutive on the right. Near fetal origin of the right PCA from a prominent right posterior communicating artery. The left posterior communicating artery is also patent. PCAs perfused to their distal aspects without stenosis. Anatomic variants: Near fetal origin of the right PCA. IMPRESSION: 1. Acute infarcts in the right insula and posteroinferior frontal lobe, with additional more scattered foci in the right parietal and occipital lobe. 2. The previously noted occluded distal right M2 now appears patent, and flow is seen in both the superior and inferior M3 branching from it. 3. No  intracranial large vessel occlusion or significant stenosis. These results will be called to the ordering clinician or representative by the Radiologist Assistant, and communication documented in the PACS or [employer]. Electronically Signed   By: Merilyn Baba M.D.   On: 03/24/2022 23:29    ECHOCARDIOGRAM COMPLETE   Result Date: 03/24/2022    ECHOCARDIOGRAM REPORT   Patient Name:   Geordan Xu. Date of Exam: 03/24/2022 Medical Rec #:  [id number]            Height:       70.5 in Accession #:    [id number]           Weight:       254.0 lb Date of Birth:  30-Mar-1966             BSA:          2.323 m Patient Age:    10 years             BP:           147/82 mmHg Patient Gender: M                    HR:           71 bpm. Exam Location:  Inpatient Procedure: 2D Echo, Cardiac Doppler and Color Doppler Indications:    I163.9 Stroke  History:        Patient has prior history of Echocardiogram examinations, most                 recent 05/10/2016. Arrythmias:Atrial Fibrillation; Risk                 Factors:Hypertension and Diabetes. Tobacco abuse (From Hx).  Sonographer:    Alvino Chapel RCS Referring Phys: [phone] MCNEILL P [place]  1. Left ventricular ejection fraction, by estimation, is 60 to 65%. The left ventricle has normal function. The left ventricle has no regional wall motion abnormalities. There is mild concentric left ventricular hypertrophy. Diastolic function indeterminant due to Afib.  2. Right ventricular systolic function is mildly reduced. The right ventricular size is normal. Tricuspid regurgitation signal is inadequate for assessing PA pressure.  3. Left atrial size was moderately dilated.  4. Right atrial size was mildly dilated.  5. The mitral valve is abnormal. Mild to moderate mitral valve regurgitation.  6. The aortic valve is tricuspid. There is mild thickening of the aortic valve. Aortic valve regurgitation is mild. Aortic valve sclerosis is present, with no evidence of  aortic valve stenosis.  7. The inferior vena cava is dilated in size with <50% respiratory variability, suggesting right atrial pressure of 15 mmHg. Comparison(s): Compared to prior TTE report in 2017, there is no significant change. Conclusion(s)/Recommendation(s): No intracardiac source of embolism detected on this transthoracic study. Consider a transesophageal echocardiogram to exclude cardiac source of embolism if clinically indicated. FINDINGS  Left Ventricle: Left ventricular ejection fraction, by estimation, is 60 to 65%. The left ventricle has normal function. The left ventricle has no regional wall motion abnormalities. Definity contrast agent was given IV to delineate the left ventricular  endocardial borders. The left ventricular internal cavity size was normal in size. There is mild concentric left ventricular hypertrophy. Diastolic function indeterminant due to Afib. Right Ventricle: The right ventricular size is normal. Right vetricular wall thickness was not well visualized. Right ventricular systolic function is mildly reduced. Tricuspid regurgitation signal is inadequate for assessing PA pressure. Left Atrium: Left atrial size was moderately dilated. Right Atrium: Right atrial size was mildly dilated. Pericardium: There is no evidence of pericardial effusion. Mitral Valve: The mitral valve is abnormal. There is mild thickening of the mitral valve leaflet(s). Mild mitral annular calcification. Mild to moderate mitral valve regurgitation. Tricuspid Valve: The tricuspid valve is normal in structure. Tricuspid valve regurgitation is trivial. Aortic Valve: The aortic valve is tricuspid. There is mild thickening of the aortic valve. Aortic valve regurgitation is mild. Aortic valve sclerosis is present, with no evidence of aortic valve stenosis. Pulmonic Valve: The pulmonic valve was not well visualized. Pulmonic valve regurgitation is trivial. Aorta: The aortic root is normal in size and structure.  Venous: The inferior vena cava is dilated in size with less  than 50% respiratory variability, suggesting right atrial pressure of 15 mmHg. IAS/Shunts: The atrial septum is grossly normal.  LEFT VENTRICLE PLAX 2D LVIDd:         4.40 cm LVIDs:         2.90 cm LV PW:         1.20 cm LV IVS:        1.10 cm LVOT diam:     1.90 cm LV SV:         48 LV SV Index:   21 LVOT Area:     2.84 cm  RIGHT VENTRICLE TAPSE (M-mode): 1.6 cm LEFT ATRIUM             Index        RIGHT ATRIUM           Index LA diam:        4.70 cm 2.02 cm/m   RA Area:     26.10 cm LA Vol (A2C):   96.6 ml 41.59 ml/m  RA Volume:   91.40 ml  39.35 ml/m LA Vol (A4C):   75.1 ml 32.33 ml/m LA Biplane Vol: 92.9 ml 40.00 ml/m  AORTIC VALVE LVOT Vmax:   93.30 cm/s LVOT Vmean:  58.000 cm/s LVOT VTI:    0.169 m  AORTA Ao Root diam: 2.85 cm MITRAL VALVE MV Area (PHT): 4.80 cm     SHUNTS MV Decel Time: 158 msec     Systemic VTI:  0.17 m MV E velocity: 148.00 cm/s  Systemic Diam: 1.90 cm Gwyndolyn Kaufman MD Electronically signed by Gwyndolyn Kaufman MD Signature Date/Time: 03/24/2022/2:58:07 PM    Final     CT ANGIO HEAD NECK W WO CM (CODE STROKE)   Result Date: 03/23/2022 CLINICAL DATA:  Left facial droop, slurred speech EXAM: CT ANGIOGRAPHY HEAD AND NECK TECHNIQUE: Multidetector CT imaging of the head and neck was performed using the standard protocol during bolus administration of intravenous contrast. Multiplanar CT image reconstructions and MIPs were obtained to evaluate the vascular anatomy. Carotid stenosis measurements (when applicable) are obtained utilizing NASCET criteria, using the distal internal carotid diameter as the denominator. RADIATION DOSE REDUCTION: This exam was performed according to the departmental dose-optimization program which includes automated exposure control, adjustment of the mA and/or kV according to patient size and/or use of iterative reconstruction technique. CONTRAST:  37m OMNIPAQUE IOHEXOL 350 MG/ML SOLN  COMPARISON:  No prior CTA available, correlation is made with CT head 03/23/2022 FINDINGS: CT HEAD FINDINGS For noncontrast findings, please see same day CT head. CTA NECK FINDINGS Aortic arch: Two-vessel arch with a common origin of the brachiocephalic and left common carotid arteries. Imaged portion shows no evidence of aneurysm or dissection. No significant stenosis of the major arch vessel origins. Right carotid system: No evidence of dissection, occlusion, or hemodynamically significant stenosis (greater than 50%). Left carotid system: No evidence of dissection, occlusion, or hemodynamically significant stenosis (greater than 50%). Vertebral arteries: No evidence of dissection, occlusion, or hemodynamically significant stenosis (greater than 50%). Skeleton: No acute osseous abnormality. Other neck: No acute finding. Upper chest: No focal pulmonary opacity or pleural effusion. Review of the MIP images confirms the above findings CTA HEAD FINDINGS Anterior circulation: Both internal carotid arteries are patent to the termini, without significant stenosis. Patent left A1. Aplastic right A1. Normal anterior communicating artery. Anterior cerebral arteries are patent to their distal aspects. No M1 stenosis or occlusion. A right M2 appears occluded, just proximal to the bifurcation (series 7, image [id number]),  with nonopacification of the more inferior M3 branch and opacification of the more superior branch. MCA branches otherwise perfused and symmetric. Posterior circulation: Vertebral arteries patent to the vertebrobasilar junction without stenosis, although the left V4 is quite diminutive after the PICA takeoff. Posterior inferior cerebellar arteries patent proximally. Basilar patent to its distal aspect. Superior cerebellar arteries patent proximally. Patent P1 segments, diminutive on the right. Near fetal origin of the right PCA from a prominent right posterior communicating artery. The left posterior communicating  artery is also patent. PCAs perfused to their distal aspects without stenosis. Venous sinuses: As permitted by contrast timing, patent. Anatomic variants: Near fetal origin of the right PCA. Review of the MIP images confirms the above findings IMPRESSION: 1. Occlusion of a distal right M2, near the bifurcation, with nonopacification of the more inferior M3 branch and opacification of the more superior branch. 2. Otherwise no intracranial hemodynamically significant stenosis. 3.  No hemodynamically significant stenosis in the neck. These findings were discussed by telephone on 03/23/2022 at 11:46 pm with provider MCNEILL KIRKPATRICK . Electronically Signed   By: Merilyn Baba M.D.   On: 03/23/2022 23:55    CT HEAD CODE STROKE WO CONTRAST   Result Date: 03/23/2022 CLINICAL DATA:  Code stroke. Left-sided weakness, facial droop, slurred speech EXAM: CT HEAD WITHOUT CONTRAST TECHNIQUE: Contiguous axial images were obtained from the base of the skull through the vertex without intravenous contrast. RADIATION DOSE REDUCTION: This exam was performed according to the departmental dose-optimization program which includes automated exposure control, adjustment of the mA and/or kV according to patient size and/or use of iterative reconstruction technique. COMPARISON:  None Available. FINDINGS: Brain: No evidence of acute infarction, hemorrhage, cerebral edema, mass, mass effect, or midline shift. No hydrocephalus or extra-axial collection. Vascular: No hyperdense vessel. Skull: Negative for fracture or focal lesion. Sinuses/Orbits: No acute finding. Other: The mastoid air cells are well aerated. ASPECTS [facility] Stroke Program Early CT Score) - Ganglionic level infarction (caudate, lentiform nuclei, internal capsule, insula, M1-M3 cortex): 7 - Supraganglionic infarction (M4-M6 cortex): 3 Total score (0-10 with 10 being normal): 10 IMPRESSION: No evidence of acute intracranial process. ASPECTS is 10. Code stroke imaging  results were communicated on 03/23/2022 at 11:33 pm to provider Dr. Leonel Ramsay via secure text paging. Electronically Signed   By: Merilyn Baba M.D.   On: 03/23/2022 23:33       Assessment & Plan:   Problem List Items Addressed This Visit       Cardiovascular and Mediastinum   Essential hypertension (Chronic)    Elevated blood pressure not well-controlled plan is to increase medication Increase Bystolic dose and increase amlodipine and have patient return in short-term follow-up      Relevant Medications   amLODipine (NORVASC) 10 MG tablet   atorvastatin (LIPITOR) 20 MG tablet   bisoprolol (ZEBETA) 10 MG tablet   apixaban (ELIQUIS) 5 MG TABS tablet   Other Relevant Orders   Comprehensive metabolic panel   CBC with Differential/Platelet   Chronic atrial fibrillation (HCC)    Rate controlled and patient now is taking Eliquis thought the Eliquis was not being taken regularly resulting in the stroke      Relevant Medications   amLODipine (NORVASC) 10 MG tablet   atorvastatin (LIPITOR) 20 MG tablet   bisoprolol (ZEBETA) 10 MG tablet   apixaban (ELIQUIS) 5 MG TABS tablet   Stroke (cerebrum) (HCC)    History of stroke will refer back to physical therapy maintain Eliquis and get a cane  for the patient      Relevant Medications   amLODipine (NORVASC) 10 MG tablet   atorvastatin (LIPITOR) 20 MG tablet   bisoprolol (ZEBETA) 10 MG tablet   apixaban (ELIQUIS) 5 MG TABS tablet     Endocrine   Controlled type 2 diabetes mellitus with hyperglycemia, without long-term current use of insulin (HCC) - Primary    Diabetes well-controlled continue metformin      Relevant Medications   atorvastatin (LIPITOR) 20 MG tablet   metFORMIN (GLUCOPHAGE) 500 MG tablet   Other Relevant Orders   POCT glucose (manual entry) (Completed)   Comprehensive metabolic panel   Hemoglobin A1c   Microalbumin / creatinine urine ratio     Musculoskeletal and Integument   Eczema of lower leg    Eczema of  left lower extremity begin Temovate therapy        Other   Other hyperlipidemia (Chronic)    Continue high-dose lipid therapy      Relevant Medications   amLODipine (NORVASC) 10 MG tablet   atorvastatin (LIPITOR) 20 MG tablet   bisoprolol (ZEBETA) 10 MG tablet   apixaban (ELIQUIS) 5 MG TABS tablet   Vitamin D insufficiency   Relevant Medications   Vitamin D, Ergocalciferol, (DRISDOL) 1.25 MG (50000 UNIT) CAPS capsule   History of tobacco use    No longer smoking      Obesity (BMI 30-39.9)    The following Lifestyle Medicine recommendations according to [place] [facility] Lifestyle [facility]) were discussed and offered to patient who agrees to start the journey:  A. Whole Foods, Plant-based plate comprising of fruits and vegetables, plant-based proteins, whole-grain carbohydrates was discussed in detail with the patient.   A list for source of those nutrients were also provided to the patient.  Patient will use only water or unsweetened tea for hydration. B.  The need to stay away from risky substances including alcohol, smoking; obtaining 7 to 9 hours of restorative sleep, at least 150 minutes of moderate intensity exercise weekly, the importance of healthy social connections,  and stress reduction techniques were discussed. C.  A full color page of  Calorie density of various food groups per pound showing examples of each food groups was provided to the patient.       Relevant Medications   metFORMIN (GLUCOPHAGE) 500 MG tablet   RESOLVED: Acute right-sided low back pain without sciatica   Relevant Medications   cyclobenzaprine (FLEXERIL) 5 MG tablet   Other Relevant Orders   CBC with Differential/Platelet   Lipid panel   Ambulatory referral to Physical Therapy   Meds ordered this encounter  Medications   amLODipine (NORVASC) 10 MG tablet    Sig: Take 1 tablet (10 mg total) by mouth daily.    Dispense:  90 tablet    Refill:  2   atorvastatin (LIPITOR) 20 MG tablet     Sig: Take 1 tablet (20 mg total) by mouth daily.    Dispense:  90 tablet    Refill:  2   bisoprolol (ZEBETA) 10 MG tablet    Sig: Take 1 tablet (10 mg total) by mouth daily.    Dispense:  90 tablet    Refill:  2   cyclobenzaprine (FLEXERIL) 5 MG tablet    Sig: Take 1 tablet (5 mg total) by mouth 3 (three) times daily as needed for muscle spasms.    Dispense:  60 tablet    Refill:  2   apixaban (ELIQUIS) 5 MG TABS tablet  Sig: Take 1 tablet (5 mg total) by mouth 2 (two) times daily.    Dispense:  120 tablet    Refill:  2   metFORMIN (GLUCOPHAGE) 500 MG tablet    Sig: Take 1 tablet (500 mg total) by mouth daily with breakfast.    Dispense:  90 tablet    Refill:  1   Vitamin D, Ergocalciferol, (DRISDOL) 1.25 MG (50000 UNIT) CAPS capsule    Sig: Take 1 capsule (50,000 Units total) by mouth every 7 (seven) days.    Dispense:  4 capsule    Refill:  2   diclofenac Sodium (VOLTAREN) 1 % GEL    Sig: Apply 2 g topically 4 (four) times daily. To right knee    Dispense:  100 g    Refill:  1   clobetasol cream (TEMOVATE) 0.05 %    Sig: Apply 1 Application topically 2 (two) times daily.    Dispense:  30 g    Refill:  0   Patient will receive fecal occult kit for colon cancer screening Follow-up: Return in about 2 months (around 07/31/2022) for htn, diabetes, chronic conditions.    Asencion Noble, MD

## 2022-05-30 NOTE — Telephone Encounter (Signed)
Paperwork signed and given to [employer]

## 2022-05-30 NOTE — Telephone Encounter (Signed)
Rx sent pull off printer and give to [facility]

## 2022-05-30 NOTE — Assessment & Plan Note (Signed)
Rate controlled and patient now is taking Eliquis thought the Eliquis was not being taken regularly resulting in the stroke

## 2022-05-30 NOTE — Assessment & Plan Note (Signed)

## 2022-05-31 ENCOUNTER — Telehealth: Payer: Self-pay | Admitting: Critical Care Medicine

## 2022-05-31 LAB — HEMOGLOBIN A1C
Est. average glucose Bld gHb Est-mCnc: 146 mg/dL
Hgb A1c MFr Bld: 6.7 % — ABNORMAL HIGH (ref 4.8–5.6)

## 2022-05-31 LAB — COMPREHENSIVE METABOLIC PANEL
ALT: 13 IU/L (ref 0–44)
AST: 15 IU/L (ref 0–40)
Albumin/Globulin Ratio: 1.2 (ref 1.2–2.2)
Albumin: 4 g/dL (ref 3.8–4.9)
Alkaline Phosphatase: 95 IU/L (ref 44–121)
BUN/Creatinine Ratio: 10 (ref 9–20)
BUN: 12 mg/dL (ref 6–24)
Bilirubin Total: 0.5 mg/dL (ref 0.0–1.2)
CO2: 24 mmol/L (ref 20–29)
Calcium: 9.1 mg/dL (ref 8.7–10.2)
Chloride: 103 mmol/L (ref 96–106)
Creatinine, Ser: 1.19 mg/dL (ref 0.76–1.27)
Globulin, Total: 3.4 g/dL (ref 1.5–4.5)
Glucose: 95 mg/dL (ref 70–99)
Potassium: 4.4 mmol/L (ref 3.5–5.2)
Sodium: 141 mmol/L (ref 134–144)
Total Protein: 7.4 g/dL (ref 6.0–8.5)
eGFR: 71 mL/min/{1.73_m2} (ref >59)

## 2022-05-31 LAB — CBC WITH DIFFERENTIAL/PLATELET
Basophils Absolute: 0 10*3/uL (ref 0.0–0.2)
Basos: 0 %
EOS (ABSOLUTE): 0.1 10*3/uL (ref 0.0–0.4)
Eos: 1 %
Hematocrit: 41.9 % (ref 37.5–51.0)
Hemoglobin: 13.9 g/dL (ref 13.0–17.7)
Immature Grans (Abs): 0 10*3/uL (ref 0.0–0.1)
Immature Granulocytes: 0 %
Lymphocytes Absolute: 2.3 10*3/uL (ref 0.7–3.1)
Lymphs: 31 %
MCH: 28.4 pg (ref 26.6–33.0)
MCHC: 33.2 g/dL (ref 31.5–35.7)
MCV: 86 fL (ref 79–97)
Monocytes Absolute: 0.6 10*3/uL (ref 0.1–0.9)
Monocytes: 8 %
Neutrophils Absolute: 4.4 10*3/uL (ref 1.4–7.0)
Neutrophils: 60 %
Platelets: 249 10*3/uL (ref 150–450)
RBC: 4.89 x10E6/uL (ref 4.14–5.80)
RDW: 13.4 % (ref 11.6–15.4)
WBC: 7.3 10*3/uL (ref 3.4–10.8)

## 2022-05-31 LAB — LIPID PANEL
Chol/HDL Ratio: 2.7 ratio (ref 0.0–5.0)
Cholesterol, Total: 115 mg/dL (ref 100–199)
HDL: 43 mg/dL (ref >39)
LDL Chol Calc (NIH): 59 mg/dL (ref 0–99)
Triglycerides: 55 mg/dL (ref 0–149)
VLDL Cholesterol Cal: 13 mg/dL (ref 5–40)

## 2022-05-31 NOTE — Progress Notes (Signed)
Let pt know labs all normal  cholesterol is at goal no medication changes

## 2022-05-31 NOTE — Telephone Encounter (Signed)
Rosey Bath w/ [employer] calling to get face to face OV notes, and height and weight for the pt's order of a quad cane.  Rosey Bath states they faxed this info as well, just in case you did not get) Fax [phone]  Cb [phone]

## 2022-05-31 NOTE — Telephone Encounter (Signed)
Routing to PCP for review.

## 2022-05-31 NOTE — Telephone Encounter (Signed)
Send note from last ov  Pt height should be in synopsis/chart

## 2022-06-03 ENCOUNTER — Telehealth: Payer: Self-pay

## 2022-06-03 NOTE — Telephone Encounter (Signed)
-----   Message from Storm Frisk, MD sent at 05/31/2022  7:17 AM EST ----- Let pt know labs all normal  cholesterol is at goal no medication changes

## 2022-06-03 NOTE — Telephone Encounter (Signed)
Last office note has been faxed to number provided.

## 2022-06-03 NOTE — Telephone Encounter (Signed)
Pt was called and is aware of results, DOB was confirmed.  ?

## 2022-06-10 NOTE — Therapy (Unsigned)
OUTPATIENT PHYSICAL THERAPY THORACOLUMBAR EVALUATION   Patient Name: Brian Moody. MRN: [id number] DOB:[birth year], 56 y.o., male Today's Date: 06/11/2022  END OF SESSION:  PT End of Session - 06/11/22 1606     Visit Number 1    Number of Visits 8    Date for PT Re-Evaluation 08/06/22    Authorization Type UHC MCR    PT Start Time 1400    PT Stop Time 1445    PT Time Calculation (min) 45 min    Activity Tolerance Patient tolerated treatment well    Behavior During Therapy [facility] for tasks assessed/performed             Past Medical History:  Diagnosis Date   Avulsion fracture of distal fibula 06/02/2013   Right distal fibula occurred 04/30/2013    Dysuria 08/14/2020   Hordeolum externum left upper eyelid 06/29/2019   Hypertension    Persistent atrial fibrillation ([place])    a. newly diagnosed on 05/2015 admission. started on coumadin.    Pre-diabetes    a. HgA1c 6.3 05/2015   Tobacco abuse    Past Surgical History:  Procedure Laterality Date   IR CT HEAD LTD  03/24/2022   IR PERCUTANEOUS ART THROMBECTOMY/INFUSION INTRACRANIAL INC DIAG ANGIO  03/24/2022   IR US GUIDE VASC ACCESS RIGHT  03/24/2022   RADIOLOGY WITH ANESTHESIA N/A 03/24/2022   Procedure: IR WITH ANESTHESIA;  Surgeon: Radiologist, Medication, MD;  Location: [place];  Service: Radiology;  Laterality: N/A;   Patient Active Problem List   Diagnosis Date Noted   Eczema of lower leg 05/30/2022   Stroke (cerebrum) ([place]) 03/24/2022   Controlled type 2 diabetes mellitus with hyperglycemia, without long-term current use of insulin ([place]) 07/03/2021   Chronic pain of right ankle 08/14/2020   Erectile dysfunction 05/11/2019   Chronic atrial fibrillation ([place]) 09/03/2018   Obesity (BMI 30-39.9) 03/29/2016   Bilateral foot pain 07/20/2015   Essential hypertension    History of tobacco use    Vitamin D insufficiency 08/24/2013   Other hyperlipidemia 08/24/2013    PCP: Elsie Stain, MD   REFERRING PROVIDER:  Elsie Stain, MD   REFERRING DIAG: M54.50 (ICD-10-CM) - Acute right-sided low back pain without sciatica  Rationale for Evaluation and Treatment: Rehabilitation  THERAPY DIAG: Acute right-sided low back pain without sciatica   ONSET DATE: chronic since 2014  SUBJECTIVE:                                                                                                                                                                                           SUBJECTIVE STATEMENT: 12/7 patient seen  in direct follow-up on arrival blood pressure 133/95 on recheck it remains elevated.  Patient has had a prior stroke involving the cerebellum on the right affecting left side with left-sided weakness and dizziness that is persisting.  Patient is having difficulty with ambulation and would like a cane for this.  He had not yet gotten much physical therapy and needs another referral for this.  He has a rash in the left lower extremity and pain in the right knee.  Patient needs better blood pressure management. Patient does have type 2 diabetes on metformin alone with good control Patient is no longer smoking  PERTINENT HISTORY:  06/2021 [name]. presents for follow-up of hypertension.  Patient became confused as to whether he should be taking his blood pressure medicines daily or not but tickly when his insurance company sent him a mail order supply of prepackaged medications.  He has not been on his bisoprolol or amlodipine for about 2 weeks and on arrival blood pressure is 148/83.  When he has been compliant with his medicines blood pressure has been well controlled.  He currently does not have any symptoms referable to the blood pressure.  He is also still smoking 5 cigarettes daily.  Patient was in the emergency room last week for acute onset of pyelonephritis with left-sided flank pain.  He is now taking a course of Bactrim and has seen improvement in symptoms.  He is trying to hydrate  himself more at this time.  Patient did declined the flu vaccine at this visit  Patient is due colon cancer screening and was sent a Cologuard kit but did not process it properly.  We will have him do a fecal occult kit here in the office.    12/7 patient seen in direct follow-up on arrival blood pressure 133/95 on recheck it remains elevated.  Patient has had a prior stroke involving the cerebellum on the right affecting left side with left-sided weakness and dizziness that is persisting.  Patient is having difficulty with ambulation and would like a cane for this.  He had not yet gotten much physical therapy and needs another referral for this.  He has a rash in the left lower extremity and pain in the right knee.  Patient needs better blood pressure management. Patient does have type 2 diabetes on metformin alone with good control Patient is no longer smoking  PAIN:  Are you having pain? Yes: NPRS scale: 10/10 Pain location: R low back Pain description: ache Aggravating factors: bending lifting Relieving factors: moist heat  PRECAUTIONS: Fall  WEIGHT BEARING RESTRICTIONS: No  FALLS:  Has patient fallen in last 6 months? No  LIVING ENVIRONMENT: Lives with: lives with their family   OCCUPATION: not working  PLOF: Independent  PATIENT GOALS: To reduce and manage my low back pain  NEXT MD VISIT: 2 mos  OBJECTIVE:   DIAGNOSTIC FINDINGS:  None available  PATIENT SURVEYS:  FOTO 32(52 predicted)  SCREENING FOR RED FLAGS: negative  COGNITION: Overall cognitive status: Within functional limits for tasks assessed     SENSATION: Not tested  MUSCLE LENGTH: Hamstrings: Right 45 deg; Left 45 deg Thomas test: NT  POSTURE: increased lumbar lordosis  PALPATION: TTP spinous processes of lumbar vertebrae  LUMBAR ROM:   AROM eval  Flexion 75%  Extension 50%  Right lateral flexion   Left lateral flexion   Right rotation   Left rotation    (Blank rows = not  tested)  LOWER EXTREMITY ROM:  Passive  Right eval Left eval  Hip flexion 90d 120d  Hip extension    Hip abduction    Hip adduction    Hip internal rotation    Hip external rotation    Knee flexion    Knee extension 0d 0d  Ankle dorsiflexion    Ankle plantarflexion    Ankle inversion    Ankle eversion     (Blank rows = not tested)  LOWER EXTREMITY MMT:    MMT Right eval Left eval  Hip flexion 4 4  Hip extension 4 4  Hip abduction 4 4  Hip adduction    Hip internal rotation    Hip external rotation    Knee flexion 4 4  Knee extension 4 4  Ankle dorsiflexion    Ankle plantarflexion 4 4  Ankle inversion    Ankle eversion    Core 3 3     LUMBAR SPECIAL TESTS:  Straight leg raise test: inconclusive and Slump test: inconclusive  FUNCTIONAL TESTS:  30 seconds chair stand test 1 with UE support  GAIT: Distance walked: 25f x2 Assistive device utilized: None Level of assistance: Complete Independence Comments: slightly antalgic due to back pain  TODAY'S TREATMENT:                                                                                                                              DATE: 06/11/22    PATIENT EDUCATION:  Education details: Discussed eval findings, rehab rationale and POC and patient is in agreement  Person educated: Patient Education method: Explanation Education comprehension: verbalized understanding and needs further education  HOME EXERCISE PROGRAM: Access Code: [email or web address]: [email or web address] Date: 06/11/2022 Prepared by: JSharlynn Oliphant Exercises - Hooklying Single Knee to Chest Stretch  - 2 x daily - 5 x weekly - 1 sets - 2 reps - 30s hold - Curl Up with Arms Crossed  - 2 x daily - 5 x weekly - 1 sets - 10 reps - Seated Table Hamstring Stretch  - 2 x daily - 5 x weekly - 1 sets - 2 reps - 30s hold  ASSESSMENT:  CLINICAL IMPRESSION: Patient is a 56y.o. male  who was seen today for physical therapy  evaluation and treatment for chronic R sided low back pain. No imanging studies available.  Patient presents with core weakness, decreased hamstring flexibility.  SLR, slump test and repeated lumbar flexion/extension inconclusive for symptom aggravation, flexibility deficits observed in trunk and hips.  Patient would benefit from OPPT to regain core strength and regain flexibility in lumbopelvic region.  OBJECTIVE IMPAIRMENTS: Abnormal gait, decreased activity tolerance, decreased endurance, decreased knowledge of condition, decreased knowledge of use of DME, decreased mobility, difficulty walking, decreased ROM, decreased strength, increased muscle spasms, improper body mechanics, postural dysfunction, and pain.   ACTIVITY LIMITATIONS: carrying, lifting, bending, sitting, standing, squatting, sleeping, and stairs  PERSONAL FACTORS: Behavior pattern, Fitness, Past/current experiences, Time since onset of injury/illness/exacerbation, and 1  comorbidity: DM  are also affecting patient's functional outcome.   REHAB POTENTIAL: Fair based on chronicity,  and lack of imaging studies  CLINICAL DECISION MAKING: Evolving/moderate complexity  EVALUATION COMPLEXITY: Low   GOALS: Goals reviewed with patient? No  SHORT TERM GOALS: Target date: 06/25/2022    Patient to demonstrate independence in HEP  Baseline: [id number] Goal status: INITIAL  2.  Increase PROM R hip flexion to 100d Baseline: 90d Goal status: INITIAL    LONG TERM GOALS: Target date: 07/09/2022    Increase SLR B to 60d to improve hamstring flexibility  Baseline: 45d B Goal status: INITIAL  2.  Decrease worst pain to 8/10 Baseline: 10/10 Goal status: INITIAL  3.  Improve core strength to 3+/5 to stabilize spine Baseline: 3/5 Goal status: INITIAL  4.  Increase 30s chair stand test to 5 reps with UE support Baseline: 1 rep with UE support Goal status: INITIAL    PLAN:  PT FREQUENCY: 1-2x/week  PT DURATION: 4  weeks  PLANNED INTERVENTIONS: Therapeutic exercises, Therapeutic activity, Neuromuscular re-education, Balance training, Gait training, Patient/Family education, Self Care, Joint mobilization, Stair training, DME instructions, Spinal mobilization, Manual therapy, and Re-evaluation.  PLAN FOR NEXT SESSION: HEP review and update, core strength, LE and trunk flexibility, aerobic training.   Lanice Shirts, PT 06/11/2022, 4:08 PM

## 2022-06-11 ENCOUNTER — Ambulatory Visit: Payer: Medicare Other | Attending: Critical Care Medicine

## 2022-06-11 ENCOUNTER — Other Ambulatory Visit: Payer: Self-pay

## 2022-06-11 DIAGNOSIS — M545 Low back pain, unspecified: Secondary | ICD-10-CM | POA: Insufficient documentation

## 2022-06-11 DIAGNOSIS — M5459 Other low back pain: Secondary | ICD-10-CM

## 2022-06-11 DIAGNOSIS — M5416 Radiculopathy, lumbar region: Secondary | ICD-10-CM

## 2022-06-11 DIAGNOSIS — M6281 Muscle weakness (generalized): Secondary | ICD-10-CM | POA: Diagnosis not present

## 2022-06-25 ENCOUNTER — Ambulatory Visit: Payer: 59 | Attending: Critical Care Medicine

## 2022-06-25 DIAGNOSIS — M6281 Muscle weakness (generalized): Secondary | ICD-10-CM | POA: Diagnosis not present

## 2022-06-25 DIAGNOSIS — M5416 Radiculopathy, lumbar region: Secondary | ICD-10-CM | POA: Diagnosis not present

## 2022-06-25 DIAGNOSIS — M5459 Other low back pain: Secondary | ICD-10-CM | POA: Insufficient documentation

## 2022-06-25 NOTE — Therapy (Signed)
OUTPATIENT PHYSICAL THERAPY TREATMENT NOTE   Patient Name: Brian Moody. MRN: [id number] DOB:07/03/1965, 57 y.o., male Today's Date: 06/25/2022  PCP: Elsie Stain, MD   REFERRING PROVIDER: Elsie Stain, MD    END OF SESSION:   PT End of Session - 06/25/22 1456     Visit Number 2    Number of Visits 8    Date for PT Re-Evaluation 08/06/22    Authorization Type UHC MCR    PT Start Time 0086   patient 10 min late for session   PT Stop Time 7619    PT Time Calculation (min) 38 min    Activity Tolerance Patient tolerated treatment well    Behavior During Therapy [facility] for tasks assessed/performed             Past Medical History:  Diagnosis Date   Avulsion fracture of distal fibula 06/02/2013   Right distal fibula occurred 04/30/2013    Dysuria 08/14/2020   Hordeolum externum left upper eyelid 06/29/2019   Hypertension    Persistent atrial fibrillation ([place])    a. newly diagnosed on 05/2015 admission. started on coumadin.    Pre-diabetes    a. HgA1c 6.3 05/2015   Tobacco abuse    Past Surgical History:  Procedure Laterality Date   IR CT HEAD LTD  03/24/2022   IR PERCUTANEOUS ART THROMBECTOMY/INFUSION INTRACRANIAL INC DIAG ANGIO  03/24/2022   IR US GUIDE VASC ACCESS RIGHT  03/24/2022   RADIOLOGY WITH ANESTHESIA N/A 03/24/2022   Procedure: IR WITH ANESTHESIA;  Surgeon: Radiologist, Medication, MD;  Location: [place];  Service: Radiology;  Laterality: N/A;   Patient Active Problem List   Diagnosis Date Noted   Eczema of lower leg 05/30/2022   Stroke (cerebrum) ([place]) 03/24/2022   Controlled type 2 diabetes mellitus with hyperglycemia, without long-term current use of insulin ([place]) 07/03/2021   Chronic pain of right ankle 08/14/2020   Erectile dysfunction 05/11/2019   Chronic atrial fibrillation ([place]) 09/03/2018   Obesity (BMI 30-39.9) 03/29/2016   Bilateral foot pain 07/20/2015   Essential hypertension    History of tobacco use    Vitamin D insufficiency  08/24/2013   Other hyperlipidemia 08/24/2013    REFERRING DIAG: M54.50 (ICD-10-CM) - Acute right-sided low back pain without sciatica   THERAPY DIAG: Acute right-sided low back pain without sciatica   Rationale for Evaluation and Treatment Rehabilitation  PERTINENT HISTORY: 06/2021 [name]. presents for follow-up of hypertension.  Patient became confused as to whether he should be taking his blood pressure medicines daily or not but tickly when his insurance company sent him a mail order supply of prepackaged medications.  He has not been on his bisoprolol or amlodipine for about 2 weeks and on arrival blood pressure is 148/83.  When he has been compliant with his medicines blood pressure has been well controlled.  He currently does not have any symptoms referable to the blood pressure.  He is also still smoking 5 cigarettes daily.  Patient was in the emergency room last week for acute onset of pyelonephritis with left-sided flank pain.  He is now taking a course of Bactrim and has seen improvement in symptoms.  He is trying to hydrate himself more at this time.  Patient did declined the flu vaccine at this visit  Patient is due colon cancer screening and was sent a Cologuard kit but did not process it properly.  We will have him do a fecal occult kit here in  the office.    12/7 patient seen in direct follow-up on arrival blood pressure 133/95 on recheck it remains elevated.  Patient has had a prior stroke involving the cerebellum on the right affecting left side with left-sided weakness and dizziness that is persisting.  Patient is having difficulty with ambulation and would like a cane for this.  He had not yet gotten much physical therapy and needs another referral for this.  He has a rash in the left lower extremity and pain in the right knee.  Patient needs better blood pressure management. Patient does have type 2 diabetes on metformin alone with good control Patient is no longer  smoking  PRECAUTIONS: Fall   SUBJECTIVE:                                                                                                                                                                                      SUBJECTIVE STATEMENT:  Rates his symptoms as high but difficulty assigning a number to pain levels.  Has been compliant with his HEP    PAIN:  Are you having pain? Yes: NPRS scale: 10/10 Pain location: R hip/low back Pain description: ache Aggravating factors: activity Relieving factors: cream and heat   OBJECTIVE: (objective measures completed at initial evaluation unless otherwise dated)  DIAGNOSTIC FINDINGS:  None available   PATIENT SURVEYS:  FOTO 32(52 predicted)   SCREENING FOR RED FLAGS: negative   COGNITION: Overall cognitive status: Within functional limits for tasks assessed                          SENSATION: Not tested   MUSCLE LENGTH: Hamstrings: Right 45 deg; Left 45 deg Thomas test: NT   POSTURE: increased lumbar lordosis   PALPATION: TTP spinous processes of lumbar vertebrae   LUMBAR ROM:    AROM eval  Flexion 75%  Extension 50%  Right lateral flexion    Left lateral flexion    Right rotation    Left rotation     (Blank rows = not tested)   LOWER EXTREMITY ROM:      Passive  Right eval Left eval  Hip flexion 90d 120d  Hip extension      Hip abduction      Hip adduction      Hip internal rotation      Hip external rotation      Knee flexion      Knee extension 0d 0d  Ankle dorsiflexion      Ankle plantarflexion      Ankle inversion      Ankle eversion       (  Blank rows = not tested)   LOWER EXTREMITY MMT:     MMT Right eval Left eval  Hip flexion 4 4  Hip extension 4 4  Hip abduction 4 4  Hip adduction      Hip internal rotation      Hip external rotation      Knee flexion 4 4  Knee extension 4 4  Ankle dorsiflexion      Ankle plantarflexion 4 4  Ankle inversion      Ankle eversion      Core 3 3       LUMBAR SPECIAL TESTS:  Straight leg raise test: inconclusive and Slump test: inconclusive   FUNCTIONAL TESTS:  30 seconds chair stand test 1 with UE support   GAIT: Distance walked: 94f x2 Assistive device utilized: None Level of assistance: Complete Independence Comments: slightly antalgic due to back pain   TODAY'S TREATMENT:          [facility] Adult PT Treatment:                                                DATE: 06/25/22 Therapeutic Exercise: Nustep L2 8 min Seated hamstring stretch 30s x2 Bil Curl ups arms crossed 15x QL stretch 30s x2 Bil 90/90 30s x2 SKTC 30s x2 B                                                                                                                       DATE: 06/11/22      PATIENT EDUCATION:  Education details: Discussed eval findings, rehab rationale and POC and patient is in agreement  Person educated: Patient Education method: Explanation Education comprehension: verbalized understanding and needs further education   HOME EXERCISE PROGRAM: Access Code: [email or web address]: [email or web address] Date: 06/11/2022 Prepared by: JSharlynn Oliphant  Exercises - Hooklying Single Knee to Chest Stretch  - 2 x daily - 5 x weekly - 1 sets - 2 reps - 30s hold - Curl Up with Arms Crossed  - 2 x daily - 5 x weekly - 1 sets - 10 reps - Seated Table Hamstring Stretch  - 2 x daily - 5 x weekly - 1 sets - 2 reps - 30s hold   ASSESSMENT:   CLINICAL IMPRESSION: First session back since evaluation. Pain unchanged, has been trying to perform HEP as instructed.  Today's session established a stretch and core strength program.  HEP reviewed and corrected as needed.  Some difficulty with stretching initially.    OBJECTIVE IMPAIRMENTS: Abnormal gait, decreased activity tolerance, decreased endurance, decreased knowledge of condition, decreased knowledge of use of DME, decreased mobility, difficulty walking, decreased ROM, decreased strength,  increased muscle spasms, improper body mechanics, postural dysfunction, and pain.    ACTIVITY LIMITATIONS: carrying, lifting, bending, sitting, standing, squatting, sleeping, and stairs   PERSONAL FACTORS: Behavior pattern, Fitness,  Past/current experiences, Time since onset of injury/illness/exacerbation, and 1 comorbidity: DM  are also affecting patient's functional outcome.    REHAB POTENTIAL: Fair based on chronicity,  and lack of imaging studies   CLINICAL DECISION MAKING: Evolving/moderate complexity   EVALUATION COMPLEXITY: Low     GOALS: Goals reviewed with patient? No   SHORT TERM GOALS: Target date: 06/25/2022     Patient to demonstrate independence in HEP  Baseline: [id number] Goal status: INITIAL   2.  Increase PROM R hip flexion to 100d Baseline: 90d Goal status: INITIAL       LONG TERM GOALS: Target date: 07/09/2022     Increase SLR B to 60d to improve hamstring flexibility  Baseline: 45d B Goal status: INITIAL   2.  Decrease worst pain to 8/10 Baseline: 10/10 Goal status: INITIAL   3.  Improve core strength to 3+/5 to stabilize spine Baseline: 3/5 Goal status: INITIAL   4.  Increase 30s chair stand test to 5 reps with UE support Baseline: 1 rep with UE support Goal status: INITIAL       PLAN:   PT FREQUENCY: 1-2x/week   PT DURATION: 4 weeks   PLANNED INTERVENTIONS: Therapeutic exercises, Therapeutic activity, Neuromuscular re-education, Balance training, Gait training, Patient/Family education, Self Care, Joint mobilization, Stair training, DME instructions, Spinal mobilization, Manual therapy, and Re-evaluation.   PLAN FOR NEXT SESSION: HEP review and update, core strength, LE and trunk flexibility, aerobic training.     Lanice Shirts, PT 06/11/2022, 4:08 PM     Lanice Shirts, PT 06/25/2022, 3:43 PM

## 2022-06-27 ENCOUNTER — Ambulatory Visit: Payer: 59

## 2022-06-27 DIAGNOSIS — M6281 Muscle weakness (generalized): Secondary | ICD-10-CM | POA: Diagnosis not present

## 2022-06-27 DIAGNOSIS — M5416 Radiculopathy, lumbar region: Secondary | ICD-10-CM

## 2022-06-27 DIAGNOSIS — M5459 Other low back pain: Secondary | ICD-10-CM

## 2022-06-27 NOTE — Therapy (Signed)
OUTPATIENT PHYSICAL THERAPY TREATMENT NOTE   Patient Name: Brian Moody. MRN: [id number] DOB:04-06-1966, 57 y.o., male Today's Date: 06/27/2022  PCP: Elsie Stain, MD   REFERRING PROVIDER: Elsie Stain, MD    END OF SESSION:   PT End of Session - 06/27/22 1508     Visit Number 3    Number of Visits 8    Date for PT Re-Evaluation 08/06/22    Authorization Type [employer] MCR    PT Start Time 1607    PT Stop Time 3710    PT Time Calculation (min) 39 min    Activity Tolerance Patient tolerated treatment well    Behavior During Therapy [facility] for tasks assessed/performed             Past Medical History:  Diagnosis Date   Avulsion fracture of distal fibula 06/02/2013   Right distal fibula occurred 04/30/2013    Dysuria 08/14/2020   Hordeolum externum left upper eyelid 06/29/2019   Hypertension    Persistent atrial fibrillation ([place])    a. newly diagnosed on 05/2015 admission. started on coumadin.    Pre-diabetes    a. HgA1c 6.3 05/2015   Tobacco abuse    Past Surgical History:  Procedure Laterality Date   IR CT HEAD LTD  03/24/2022   IR PERCUTANEOUS ART THROMBECTOMY/INFUSION INTRACRANIAL INC DIAG ANGIO  03/24/2022   IR US GUIDE VASC ACCESS RIGHT  03/24/2022   RADIOLOGY WITH ANESTHESIA N/A 03/24/2022   Procedure: IR WITH ANESTHESIA;  Surgeon: Radiologist, Medication, MD;  Location: [place];  Service: Radiology;  Laterality: N/A;   Patient Active Problem List   Diagnosis Date Noted   Eczema of lower leg 05/30/2022   Stroke (cerebrum) ([place]) 03/24/2022   Controlled type 2 diabetes mellitus with hyperglycemia, without long-term current use of insulin ([place]) 07/03/2021   Chronic pain of right ankle 08/14/2020   Erectile dysfunction 05/11/2019   Chronic atrial fibrillation ([place]) 09/03/2018   Obesity (BMI 30-39.9) 03/29/2016   Bilateral foot pain 07/20/2015   Essential hypertension    History of tobacco use    Vitamin D insufficiency 08/24/2013   Other hyperlipidemia  08/24/2013    REFERRING DIAG: M54.50 (ICD-10-CM) - Acute right-sided low back pain without sciatica   THERAPY DIAG: Acute right-sided low back pain without sciatica   Rationale for Evaluation and Treatment Rehabilitation  PERTINENT HISTORY: 06/2021 [name]. presents for follow-up of hypertension.  Patient became confused as to whether he should be taking his blood pressure medicines daily or not but tickly when his insurance company sent him a mail order supply of prepackaged medications.  He has not been on his bisoprolol or amlodipine for about 2 weeks and on arrival blood pressure is 148/83.  When he has been compliant with his medicines blood pressure has been well controlled.  He currently does not have any symptoms referable to the blood pressure.  He is also still smoking 5 cigarettes daily.  Patient was in the emergency room last week for acute onset of pyelonephritis with left-sided flank pain.  He is now taking a course of Bactrim and has seen improvement in symptoms.  He is trying to hydrate himself more at this time.  Patient did declined the flu vaccine at this visit  Patient is due colon cancer screening and was sent a Cologuard kit but did not process it properly.  We will have him do a fecal occult kit here in the office.    12/7 patient  seen in direct follow-up on arrival blood pressure 133/95 on recheck it remains elevated.  Patient has had a prior stroke involving the cerebellum on the right affecting left side with left-sided weakness and dizziness that is persisting.  Patient is having difficulty with ambulation and would like a cane for this.  He had not yet gotten much physical therapy and needs another referral for this.  He has a rash in the left lower extremity and pain in the right knee.  Patient needs better blood pressure management. Patient does have type 2 diabetes on metformin alone with good control Patient is no longer smoking  PRECAUTIONS: Fall    SUBJECTIVE:                                                                                                                                                                                      SUBJECTIVE STATEMENT:  pain remains in low back 5/10 in intensity, has been stretching with minimal relief. Has been lifting 45# water jugs which aggravates his condition.   PAIN:  Are you having pain? Yes: NPRS scale: 10/10 Pain location: R hip/low back Pain description: ache Aggravating factors: activity Relieving factors: cream and heat   OBJECTIVE: (objective measures completed at initial evaluation unless otherwise dated)  DIAGNOSTIC FINDINGS:  None available   PATIENT SURVEYS:  FOTO 32(52 predicted)   SCREENING FOR RED FLAGS: negative   COGNITION: Overall cognitive status: Within functional limits for tasks assessed                          SENSATION: Not tested   MUSCLE LENGTH: Hamstrings: Right 45 deg; Left 45 deg Thomas test: NT   POSTURE: increased lumbar lordosis   PALPATION: TTP spinous processes of lumbar vertebrae   LUMBAR ROM:    AROM eval  Flexion 75%  Extension 50%  Right lateral flexion    Left lateral flexion    Right rotation    Left rotation     (Blank rows = not tested)   LOWER EXTREMITY ROM:      Passive  Right eval Left eval  Hip flexion 90d 120d  Hip extension      Hip abduction      Hip adduction      Hip internal rotation      Hip external rotation      Knee flexion      Knee extension 0d 0d  Ankle dorsiflexion      Ankle plantarflexion      Ankle inversion      Ankle eversion       (Blank rows = not  tested)   LOWER EXTREMITY MMT:     MMT Right eval Left eval  Hip flexion 4 4  Hip extension 4 4  Hip abduction 4 4  Hip adduction      Hip internal rotation      Hip external rotation      Knee flexion 4 4  Knee extension 4 4  Ankle dorsiflexion      Ankle plantarflexion 4 4  Ankle inversion      Ankle eversion       Core 3 3      LUMBAR SPECIAL TESTS:  Straight leg raise test: inconclusive and Slump test: inconclusive   FUNCTIONAL TESTS:  30 seconds chair stand test 1 with UE support   GAIT: Distance walked: 8f x2 Assistive device utilized: None Level of assistance: Complete Independence Comments: slightly antalgic due to back pain   TODAY'S TREATMENT:      [facility] Adult PT Treatment:                                                DATE: 06/27/22 Therapeutic Exercise: Nustep L4 8 min Seated hamstring stretch 30s x2 Bil Curl ups arms crossed 15x QL stretch 30s x2 Bil 90/90 30s x2 DKTC over ball 15x with OP  Bridge with 5# ball 15x Hip flexor stretch 30s x2 Bil(opposite knee straight)     [facility] Adult PT Treatment:                                                DATE: 06/25/22 Therapeutic Exercise: Nustep L2 8 min Seated hamstring stretch 30s x2 Bil Curl ups arms crossed 15x QL stretch 30s x2 Bil 90/90 30s x2 SKTC 30s x2 B                                                                                                                       DATE: 06/11/22      PATIENT EDUCATION:  Education details: Discussed eval findings, rehab rationale and POC and patient is in agreement  Person educated: Patient Education method: Explanation Education comprehension: verbalized understanding and needs further education   HOME EXERCISE PROGRAM: Access Code: [email or web address]: [email or web address] Date: 06/27/2022 Prepared by: JSharlynn Oliphant Exercises - Hooklying Single Knee to Chest Stretch  - 2 x daily - 5 x weekly - 1 sets - 2 reps - 30s hold - Curl Up with Arms Crossed  - 2 x daily - 5 x weekly - 1 sets - 10 reps - Seated Table Hamstring Stretch  - 2 x daily - 5 x weekly - 1 sets - 2 reps - 30s hold - Sidelying Open Book Thoracic Lumbar Rotation and Extension  - 2 x daily -  5 x weekly - 1 sets - 10 reps - Supine Hip Flexor Stretch with Weight  - 2 x daily - 5 x weekly - 1 sets - 2  reps - 30s hold   ASSESSMENT:   CLINICAL IMPRESSION: Continued pain levels but less intense, notes relief with stretching.  Focus of today's session was LE stretching emphasizing hip flexors, hip flexion and QL.  Incorporated additional core and abdominal strengthening tasks which patient struggled with due to weakness and deconditioning    OBJECTIVE IMPAIRMENTS: Abnormal gait, decreased activity tolerance, decreased endurance, decreased knowledge of condition, decreased knowledge of use of DME, decreased mobility, difficulty walking, decreased ROM, decreased strength, increased muscle spasms, improper body mechanics, postural dysfunction, and pain.    ACTIVITY LIMITATIONS: carrying, lifting, bending, sitting, standing, squatting, sleeping, and stairs   PERSONAL FACTORS: Behavior pattern, Fitness, Past/current experiences, Time since onset of injury/illness/exacerbation, and 1 comorbidity: DM  are also affecting patient's functional outcome.    REHAB POTENTIAL: Fair based on chronicity,  and lack of imaging studies   CLINICAL DECISION MAKING: Evolving/moderate complexity   EVALUATION COMPLEXITY: Low     GOALS: Goals reviewed with patient? No   SHORT TERM GOALS: Target date: 06/25/2022     Patient to demonstrate independence in HEP  Baseline: [id number] Goal status: INITIAL   2.  Increase PROM R hip flexion to 100d Baseline: 90d Goal status: INITIAL       LONG TERM GOALS: Target date: 07/09/2022     Increase SLR B to 60d to improve hamstring flexibility  Baseline: 45d B Goal status: INITIAL   2.  Decrease worst pain to 8/10 Baseline: 10/10 Goal status: INITIAL   3.  Improve core strength to 3+/5 to stabilize spine Baseline: 3/5 Goal status: INITIAL   4.  Increase 30s chair stand test to 5 reps with UE support Baseline: 1 rep with UE support Goal status: INITIAL       PLAN:   PT FREQUENCY: 1-2x/week   PT DURATION: 4 weeks   PLANNED INTERVENTIONS: Therapeutic  exercises, Therapeutic activity, Neuromuscular re-education, Balance training, Gait training, Patient/Family education, Self Care, Joint mobilization, Stair training, DME instructions, Spinal mobilization, Manual therapy, and Re-evaluation.   PLAN FOR NEXT SESSION: HEP review and update, core strength, LE and trunk flexibility, aerobic training.     Lanice Shirts, PT 06/11/2022, 4:08 PM     Lanice Shirts, PT 06/27/2022, 4:04 PM

## 2022-07-02 ENCOUNTER — Ambulatory Visit: Payer: 59

## 2022-07-02 NOTE — Therapy (Deleted)
OUTPATIENT PHYSICAL THERAPY TREATMENT NOTE   Patient Name: Brian Moody. MRN: [id number] DOB:04/14/1966, 57 y.o., male Today's Date: 07/02/2022  PCP: Storm Frisk, MD   REFERRING PROVIDER: Storm Frisk, MD    END OF SESSION:     Past Medical History:  Diagnosis Date   Avulsion fracture of distal fibula 06/02/2013   Right distal fibula occurred 04/30/2013    Dysuria 08/14/2020   Hordeolum externum left upper eyelid 06/29/2019   Hypertension    Persistent atrial fibrillation ([facility])    a. newly diagnosed on 05/2015 admission. started on coumadin.    Pre-diabetes    a. HgA1c 6.3 05/2015   Tobacco abuse    Past Surgical History:  Procedure Laterality Date   IR CT HEAD LTD  03/24/2022   IR PERCUTANEOUS ART THROMBECTOMY/INFUSION INTRACRANIAL INC DIAG ANGIO  03/24/2022   IR US GUIDE VASC ACCESS RIGHT  03/24/2022   RADIOLOGY WITH ANESTHESIA N/A 03/24/2022   Procedure: IR WITH ANESTHESIA;  Surgeon: Radiologist, Medication, MD;  Location: [facility];  Service: Radiology;  Laterality: N/A;   Patient Active Problem List   Diagnosis Date Noted   Eczema of lower leg 05/30/2022   Stroke (cerebrum) ([facility]) 03/24/2022   Controlled type 2 diabetes mellitus with hyperglycemia, without long-term current use of insulin ([facility]) 07/03/2021   Chronic pain of right ankle 08/14/2020   Erectile dysfunction 05/11/2019   Chronic atrial fibrillation ([facility]) 09/03/2018   Obesity (BMI 30-39.9) 03/29/2016   Bilateral foot pain 07/20/2015   Essential hypertension    History of tobacco use    Vitamin D insufficiency 08/24/2013   Other hyperlipidemia 08/24/2013    REFERRING DIAG: M54.50 (ICD-10-CM) - Acute right-sided low back pain without sciatica   THERAPY DIAG: Acute right-sided low back pain without sciatica   Rationale for Evaluation and Treatment Rehabilitation  PERTINENT HISTORY: 06/2021 [name]. presents for follow-up of hypertension.  Patient became confused as to whether he  should be taking his blood pressure medicines daily or not but tickly when his insurance company sent him a mail order supply of prepackaged medications.  He has not been on his bisoprolol or amlodipine for about 2 weeks and on arrival blood pressure is 148/83.  When he has been compliant with his medicines blood pressure has been well controlled.  He currently does not have any symptoms referable to the blood pressure.  He is also still smoking 5 cigarettes daily.  Patient was in the emergency room last week for acute onset of pyelonephritis with left-sided flank pain.  He is now taking a course of Bactrim and has seen improvement in symptoms.  He is trying to hydrate himself more at this time.  Patient did declined the flu vaccine at this visit  Patient is due colon cancer screening and was sent a Cologuard kit but did not process it properly.  We will have him do a fecal occult kit here in the office.    12/7 patient seen in direct follow-up on arrival blood pressure 133/95 on recheck it remains elevated.  Patient has had a prior stroke involving the cerebellum on the right affecting left side with left-sided weakness and dizziness that is persisting.  Patient is having difficulty with ambulation and would like a cane for this.  He had not yet gotten much physical therapy and needs another referral for this.  He has a rash in the left lower extremity and pain in the right knee.  Patient needs better blood  pressure management. Patient does have type 2 diabetes on metformin alone with good control Patient is no longer smoking  PRECAUTIONS: Fall   SUBJECTIVE:                                                                                                                                                                                      SUBJECTIVE STATEMENT:  pain remains in low back 5/10 in intensity, has been stretching with minimal relief. Has been lifting 45# water jugs which aggravates his  condition.   PAIN:  Are you having pain? Yes: NPRS scale: 10/10 Pain location: R hip/low back Pain description: ache Aggravating factors: activity Relieving factors: cream and heat   OBJECTIVE: (objective measures completed at initial evaluation unless otherwise dated)  DIAGNOSTIC FINDINGS:  None available   PATIENT SURVEYS:  FOTO 32(52 predicted)   SCREENING FOR RED FLAGS: negative   COGNITION: Overall cognitive status: Within functional limits for tasks assessed                          SENSATION: Not tested   MUSCLE LENGTH: Hamstrings: Right 45 deg; Left 45 deg Thomas test: NT   POSTURE: increased lumbar lordosis   PALPATION: TTP spinous processes of lumbar vertebrae   LUMBAR ROM:    AROM eval  Flexion 75%  Extension 50%  Right lateral flexion    Left lateral flexion    Right rotation    Left rotation     (Blank rows = not tested)   LOWER EXTREMITY ROM:      Passive  Right eval Left eval  Hip flexion 90d 120d  Hip extension      Hip abduction      Hip adduction      Hip internal rotation      Hip external rotation      Knee flexion      Knee extension 0d 0d  Ankle dorsiflexion      Ankle plantarflexion      Ankle inversion      Ankle eversion       (Blank rows = not tested)   LOWER EXTREMITY MMT:     MMT Right eval Left eval  Hip flexion 4 4  Hip extension 4 4  Hip abduction 4 4  Hip adduction      Hip internal rotation      Hip external rotation      Knee flexion 4 4  Knee extension 4 4  Ankle dorsiflexion      Ankle plantarflexion 4 4  Ankle inversion      Ankle eversion  Core 3 3      LUMBAR SPECIAL TESTS:  Straight leg raise test: inconclusive and Slump test: inconclusive   FUNCTIONAL TESTS:  30 seconds chair stand test 1 with UE support   GAIT: Distance walked: 70ft x2 Assistive device utilized: None Level of assistance: Complete Independence Comments: slightly antalgic due to back pain   TODAY'S TREATMENT:       [facility] Adult PT Treatment:                                                DATE: 06/27/22 Therapeutic Exercise: Nustep L4 8 min Seated hamstring stretch 30s x2 Bil Curl ups arms crossed 15x QL stretch 30s x2 Bil 90/90 30s x2 DKTC over ball 15x with OP  Bridge with 5# ball 15x Hip flexor stretch 30s x2 Bil(opposite knee straight)     [facility] Adult PT Treatment:                                                DATE: 06/25/22 Therapeutic Exercise: Nustep L2 8 min Seated hamstring stretch 30s x2 Bil Curl ups arms crossed 15x QL stretch 30s x2 Bil 90/90 30s x2 SKTC 30s x2 B                                                                                                                       DATE: 06/11/22      PATIENT EDUCATION:  Education details: Discussed eval findings, rehab rationale and POC and patient is in agreement  Person educated: Patient Education method: Explanation Education comprehension: verbalized understanding and needs further education   HOME EXERCISE PROGRAM: Access Code: [email or web address] URL: [email or web address] Date: 06/27/2022 Prepared by: Gustavus Bryant  Exercises - Hooklying Single Knee to Chest Stretch  - 2 x daily - 5 x weekly - 1 sets - 2 reps - 30s hold - Curl Up with Arms Crossed  - 2 x daily - 5 x weekly - 1 sets - 10 reps - Seated Table Hamstring Stretch  - 2 x daily - 5 x weekly - 1 sets - 2 reps - 30s hold - Sidelying Open Book Thoracic Lumbar Rotation and Extension  - 2 x daily - 5 x weekly - 1 sets - 10 reps - Supine Hip Flexor Stretch with Weight  - 2 x daily - 5 x weekly - 1 sets - 2 reps - 30s hold   ASSESSMENT:   CLINICAL IMPRESSION: Continued pain levels but less intense, notes relief with stretching.  Focus of today's session was LE stretching emphasizing hip flexors, hip flexion and QL.  Incorporated additional core and abdominal strengthening tasks which patient struggled with due to weakness and deconditioning    OBJECTIVE  IMPAIRMENTS:  Abnormal gait, decreased activity tolerance, decreased endurance, decreased knowledge of condition, decreased knowledge of use of DME, decreased mobility, difficulty walking, decreased ROM, decreased strength, increased muscle spasms, improper body mechanics, postural dysfunction, and pain.    ACTIVITY LIMITATIONS: carrying, lifting, bending, sitting, standing, squatting, sleeping, and stairs   PERSONAL FACTORS: Behavior pattern, Fitness, Past/current experiences, Time since onset of injury/illness/exacerbation, and 1 comorbidity: DM  are also affecting patient's functional outcome.    REHAB POTENTIAL: Fair based on chronicity,  and lack of imaging studies   CLINICAL DECISION MAKING: Evolving/moderate complexity   EVALUATION COMPLEXITY: Low     GOALS: Goals reviewed with patient? No   SHORT TERM GOALS: Target date: 06/25/2022     Patient to demonstrate independence in HEP  Baseline: [id number] Goal status: INITIAL   2.  Increase PROM R hip flexion to 100d Baseline: 90d Goal status: INITIAL       LONG TERM GOALS: Target date: 07/09/2022     Increase SLR B to 60d to improve hamstring flexibility  Baseline: 45d B Goal status: INITIAL   2.  Decrease worst pain to 8/10 Baseline: 10/10 Goal status: INITIAL   3.  Improve core strength to 3+/5 to stabilize spine Baseline: 3/5 Goal status: INITIAL   4.  Increase 30s chair stand test to 5 reps with UE support Baseline: 1 rep with UE support Goal status: INITIAL       PLAN:   PT FREQUENCY: 1-2x/week   PT DURATION: 4 weeks   PLANNED INTERVENTIONS: Therapeutic exercises, Therapeutic activity, Neuromuscular re-education, Balance training, Gait training, Patient/Family education, Self Care, Joint mobilization, Stair training, DME instructions, Spinal mobilization, Manual therapy, and Re-evaluation.   PLAN FOR NEXT SESSION: HEP review and update, core strength, LE and trunk flexibility, aerobic training.      Hildred Laser, PT 06/11/2022, 4:08 PM     Hildred Laser, PT 07/02/2022, 12:07 PM

## 2022-07-03 ENCOUNTER — Other Ambulatory Visit: Payer: Self-pay

## 2022-07-03 NOTE — Patient Outreach (Signed)
Telephone outreach to patient to obtain mRS was successfully completed. MRS=2  Brian Moody [facility] Care Management Assistant [phone]  

## 2022-07-04 ENCOUNTER — Ambulatory Visit: Payer: 59

## 2022-07-04 DIAGNOSIS — M6281 Muscle weakness (generalized): Secondary | ICD-10-CM | POA: Diagnosis not present

## 2022-07-04 DIAGNOSIS — M5416 Radiculopathy, lumbar region: Secondary | ICD-10-CM | POA: Diagnosis not present

## 2022-07-04 DIAGNOSIS — M5459 Other low back pain: Secondary | ICD-10-CM | POA: Diagnosis not present

## 2022-07-04 NOTE — Therapy (Signed)
OUTPATIENT PHYSICAL THERAPY TREATMENT NOTE   Patient Name: Brian Moody. MRN: [id number] DOB:05/10/1966, 57 y.o., male Today's Date: 07/04/2022  PCP: Elsie Stain, MD   REFERRING PROVIDER: Elsie Stain, MD    END OF SESSION:   PT End of Session - 07/04/22 1516     Visit Number 4    Number of Visits 8    Date for PT Re-Evaluation 08/06/22    Authorization Type UHC MCR    PT Start Time 1505    PT Stop Time 5035    PT Time Calculation (min) 30 min    Activity Tolerance Patient tolerated treatment well    Behavior During Therapy [facility] for tasks assessed/performed              Past Medical History:  Diagnosis Date   Avulsion fracture of distal fibula 06/02/2013   Right distal fibula occurred 04/30/2013    Dysuria 08/14/2020   Hordeolum externum left upper eyelid 06/29/2019   Hypertension    Persistent atrial fibrillation ([place])    a. newly diagnosed on 05/2015 admission. started on coumadin.    Pre-diabetes    a. HgA1c 6.3 05/2015   Tobacco abuse    Past Surgical History:  Procedure Laterality Date   IR CT HEAD LTD  03/24/2022   IR PERCUTANEOUS ART THROMBECTOMY/INFUSION INTRACRANIAL INC DIAG ANGIO  03/24/2022   IR US GUIDE VASC ACCESS RIGHT  03/24/2022   RADIOLOGY WITH ANESTHESIA N/A 03/24/2022   Procedure: IR WITH ANESTHESIA;  Surgeon: Radiologist, Medication, MD;  Location: [place];  Service: Radiology;  Laterality: N/A;   Patient Active Problem List   Diagnosis Date Noted   Eczema of lower leg 05/30/2022   Stroke (cerebrum) ([place]) 03/24/2022   Controlled type 2 diabetes mellitus with hyperglycemia, without long-term current use of insulin ([place]) 07/03/2021   Chronic pain of right ankle 08/14/2020   Erectile dysfunction 05/11/2019   Chronic atrial fibrillation ([place]) 09/03/2018   Obesity (BMI 30-39.9) 03/29/2016   Bilateral foot pain 07/20/2015   Essential hypertension    History of tobacco use    Vitamin D insufficiency 08/24/2013   Other hyperlipidemia  08/24/2013    REFERRING DIAG: M54.50 (ICD-10-CM) - Acute right-sided low back pain without sciatica   THERAPY DIAG: Acute right-sided low back pain without sciatica   Rationale for Evaluation and Treatment Rehabilitation  PERTINENT HISTORY: 06/2021 [name]. presents for follow-up of hypertension.  Patient became confused as to whether he should be taking his blood pressure medicines daily or not but tickly when his insurance company sent him a mail order supply of prepackaged medications.  He has not been on his bisoprolol or amlodipine for about 2 weeks and on arrival blood pressure is 148/83.  When he has been compliant with his medicines blood pressure has been well controlled.  He currently does not have any symptoms referable to the blood pressure.  He is also still smoking 5 cigarettes daily.  Patient was in the emergency room last week for acute onset of pyelonephritis with left-sided flank pain.  He is now taking a course of Bactrim and has seen improvement in symptoms.  He is trying to hydrate himself more at this time.  Patient did declined the flu vaccine at this visit  Patient is due colon cancer screening and was sent a Cologuard kit but did not process it properly.  We will have him do a fecal occult kit here in the office.    12/7  patient seen in direct follow-up on arrival blood pressure 133/95 on recheck it remains elevated.  Patient has had a prior stroke involving the cerebellum on the right affecting left side with left-sided weakness and dizziness that is persisting.  Patient is having difficulty with ambulation and would like a cane for this.  He had not yet gotten much physical therapy and needs another referral for this.  He has a rash in the left lower extremity and pain in the right knee.  Patient needs better blood pressure management. Patient does have type 2 diabetes on metformin alone with good control Patient is no longer smoking  PRECAUTIONS: Fall    SUBJECTIVE:                                                                                                                                                                                      SUBJECTIVE STATEMENT:  Still sore but less tight.   PAIN:  Are you having pain? Yes: NPRS scale: 10/10 Pain location: R hip/low back Pain description: ache Aggravating factors: activity Relieving factors: cream and heat   OBJECTIVE: (objective measures completed at initial evaluation unless otherwise dated)  DIAGNOSTIC FINDINGS:  None available   PATIENT SURVEYS:  FOTO 32(52 predicted)   SCREENING FOR RED FLAGS: negative   COGNITION: Overall cognitive status: Within functional limits for tasks assessed                          SENSATION: Not tested   MUSCLE LENGTH: Hamstrings: Right 45 deg; Left 45 deg Thomas test: NT   POSTURE: increased lumbar lordosis   PALPATION: TTP spinous processes of lumbar vertebrae   LUMBAR ROM:    AROM eval  Flexion 75%  Extension 50%  Right lateral flexion    Left lateral flexion    Right rotation    Left rotation     (Blank rows = not tested)   LOWER EXTREMITY ROM:      Passive  Right eval Left eval  Hip flexion 90d 120d  Hip extension      Hip abduction      Hip adduction      Hip internal rotation      Hip external rotation      Knee flexion      Knee extension 0d 0d  Ankle dorsiflexion      Ankle plantarflexion      Ankle inversion      Ankle eversion       (Blank rows = not tested)   LOWER EXTREMITY MMT:     MMT Right eval Left eval  Hip flexion  4 4  Hip extension 4 4  Hip abduction 4 4  Hip adduction      Hip internal rotation      Hip external rotation      Knee flexion 4 4  Knee extension 4 4  Ankle dorsiflexion      Ankle plantarflexion 4 4  Ankle inversion      Ankle eversion      Core 3 3      LUMBAR SPECIAL TESTS:  Straight leg raise test: inconclusive and Slump test: inconclusive   FUNCTIONAL  TESTS:  30 seconds chair stand test 1 with UE support   GAIT: Distance walked: 56ft x2 Assistive device utilized: None Level of assistance: Complete Independence Comments: slightly antalgic due to back pain   TODAY'S TREATMENT:     [facility] Adult PT Treatment:                                                DATE: 07/04/22 Therapeutic Exercise: Nustep L4 6 min Open book 10/10 QL stretch 30s x2 Bil DKTC with 5# ball Bridge with 5# ball 15x Hip flexor stretch 30s x2 R L over bolster R piriformis release 2 min  [facility] Adult PT Treatment:                                                DATE: 06/27/22 Therapeutic Exercise: Nustep L4 8 min Seated hamstring stretch 30s x2 Bil Curl ups arms crossed 15x QL stretch 30s x2 Bil 90/90 30s x2 DKTC over ball 15x with OP  Bridge with 5# ball 15x Hip flexor stretch 30s x2 Bil(opposite knee straight)     [facility] Adult PT Treatment:                                                DATE: 06/25/22 Therapeutic Exercise: Nustep L2 8 min Seated hamstring stretch 30s x2 Bil Curl ups arms crossed 15x QL stretch 30s x2 Bil 90/90 30s x2 SKTC 30s x2 B                                                                                                                       DATE: 06/11/22      PATIENT EDUCATION:  Education details: Discussed eval findings, rehab rationale and POC and patient is in agreement  Person educated: Patient Education method: Explanation Education comprehension: verbalized understanding and needs further education   HOME EXERCISE PROGRAM: Access Code: [email or web address] URL: [email or web address] Date: 06/27/2022 Prepared by: Sharlynn Oliphant  Exercises - Hooklying Single Knee to Chest Stretch  - 2 x  daily - 5 x weekly - 1 sets - 2 reps - 30s hold - Curl Up with Arms Crossed  - 2 x daily - 5 x weekly - 1 sets - 10 reps - Seated Table Hamstring Stretch  - 2 x daily - 5 x weekly - 1 sets - 2 reps - 30s hold - Sidelying Open Book  Thoracic Lumbar Rotation and Extension  - 2 x daily - 5 x weekly - 1 sets - 10 reps - Supine Hip Flexor Stretch with Weight  - 2 x daily - 5 x weekly - 1 sets - 2 reps - 30s hold   ASSESSMENT:   CLINICAL IMPRESSION: (late for session)  Focus today was stretching of R hip and lumbar spine incorporating rotational tasks. Added open book, increased difficulty of R hip flexor stretching.  TTP at R piriformis and release performed.    OBJECTIVE IMPAIRMENTS: Abnormal gait, decreased activity tolerance, decreased endurance, decreased knowledge of condition, decreased knowledge of use of DME, decreased mobility, difficulty walking, decreased ROM, decreased strength, increased muscle spasms, improper body mechanics, postural dysfunction, and pain.    ACTIVITY LIMITATIONS: carrying, lifting, bending, sitting, standing, squatting, sleeping, and stairs   PERSONAL FACTORS: Behavior pattern, Fitness, Past/current experiences, Time since onset of injury/illness/exacerbation, and 1 comorbidity: DM  are also affecting patient's functional outcome.    REHAB POTENTIAL: Fair based on chronicity,  and lack of imaging studies   CLINICAL DECISION MAKING: Evolving/moderate complexity   EVALUATION COMPLEXITY: Low     GOALS: Goals reviewed with patient? No   SHORT TERM GOALS: Target date: 06/25/2022     Patient to demonstrate independence in HEP  Baseline: [id number] Goal status: Met   2.  Increase PROM R hip flexion to 100d Baseline: 90d Goal status: INITIAL       LONG TERM GOALS: Target date: 07/09/2022     Increase SLR B to 60d to improve hamstring flexibility  Baseline: 45d B Goal status: INITIAL   2.  Decrease worst pain to 8/10 Baseline: 10/10 Goal status: INITIAL   3.  Improve core strength to 3+/5 to stabilize spine Baseline: 3/5 Goal status: INITIAL   4.  Increase 30s chair stand test to 5 reps with UE support Baseline: 1 rep with UE support Goal status: INITIAL       PLAN:   PT  FREQUENCY: 1-2x/week   PT DURATION: 4 weeks   PLANNED INTERVENTIONS: Therapeutic exercises, Therapeutic activity, Neuromuscular re-education, Balance training, Gait training, Patient/Family education, Self Care, Joint mobilization, Stair training, DME instructions, Spinal mobilization, Manual therapy, and Re-evaluation.   PLAN FOR NEXT SESSION: HEP review and update, core strength, LE and trunk flexibility, aerobic training.    Hildred Laser, PT 07/04/2022, 3:53 PM

## 2022-07-08 ENCOUNTER — Other Ambulatory Visit: Payer: Self-pay | Admitting: Critical Care Medicine

## 2022-07-09 ENCOUNTER — Ambulatory Visit: Payer: 59

## 2022-07-09 DIAGNOSIS — M6281 Muscle weakness (generalized): Secondary | ICD-10-CM | POA: Diagnosis not present

## 2022-07-09 DIAGNOSIS — M5459 Other low back pain: Secondary | ICD-10-CM | POA: Diagnosis not present

## 2022-07-09 DIAGNOSIS — M5416 Radiculopathy, lumbar region: Secondary | ICD-10-CM | POA: Diagnosis not present

## 2022-07-09 NOTE — Therapy (Signed)
OUTPATIENT PHYSICAL THERAPY TREATMENT NOTE   Patient Name: Brian Moody. MRN: [id number] DOB:June 08, 1966, 57 y.o., male Today's Date: 07/09/2022  PCP: Storm Frisk, MD   REFERRING PROVIDER: Storm Frisk, MD    END OF SESSION:   PT End of Session - 07/09/22 1446     Visit Number 5    Number of Visits 8    Date for PT Re-Evaluation 08/06/22    Authorization Type UHC MCR    PT Start Time 1445    PT Stop Time 1525    PT Time Calculation (min) 40 min    Activity Tolerance Patient tolerated treatment well    Behavior During Therapy [facility] for tasks assessed/performed              Past Medical History:  Diagnosis Date   Avulsion fracture of distal fibula 06/02/2013   Right distal fibula occurred 04/30/2013    Dysuria 08/14/2020   Hordeolum externum left upper eyelid 06/29/2019   Hypertension    Persistent atrial fibrillation ([facility])    a. newly diagnosed on 05/2015 admission. started on coumadin.    Pre-diabetes    a. HgA1c 6.3 05/2015   Tobacco abuse    Past Surgical History:  Procedure Laterality Date   IR CT HEAD LTD  03/24/2022   IR PERCUTANEOUS ART THROMBECTOMY/INFUSION INTRACRANIAL INC DIAG ANGIO  03/24/2022   IR US GUIDE VASC ACCESS RIGHT  03/24/2022   RADIOLOGY WITH ANESTHESIA N/A 03/24/2022   Procedure: IR WITH ANESTHESIA;  Surgeon: Radiologist, Medication, MD;  Location: [facility];  Service: Radiology;  Laterality: N/A;   Patient Active Problem List   Diagnosis Date Noted   Eczema of lower leg 05/30/2022   Stroke (cerebrum) ([facility]) 03/24/2022   Controlled type 2 diabetes mellitus with hyperglycemia, without long-term current use of insulin ([facility]) 07/03/2021   Chronic pain of right ankle 08/14/2020   Erectile dysfunction 05/11/2019   Chronic atrial fibrillation ([facility]) 09/03/2018   Obesity (BMI 30-39.9) 03/29/2016   Bilateral foot pain 07/20/2015   Essential hypertension    History of tobacco use    Vitamin D insufficiency 08/24/2013   Other hyperlipidemia  08/24/2013    REFERRING DIAG: M54.50 (ICD-10-CM) - Acute right-sided low back pain without sciatica   THERAPY DIAG: Acute right-sided low back pain without sciatica   Rationale for Evaluation and Treatment Rehabilitation  PERTINENT HISTORY: 06/2021 [name]. presents for follow-up of hypertension.  Patient became confused as to whether he should be taking his blood pressure medicines daily or not but tickly when his insurance company sent him a mail order supply of prepackaged medications.  He has not been on his bisoprolol or amlodipine for about 2 weeks and on arrival blood pressure is 148/83.  When he has been compliant with his medicines blood pressure has been well controlled.  He currently does not have any symptoms referable to the blood pressure.  He is also still smoking 5 cigarettes daily.  Patient was in the emergency room last week for acute onset of pyelonephritis with left-sided flank pain.  He is now taking a course of Bactrim and has seen improvement in symptoms.  He is trying to hydrate himself more at this time.  Patient did declined the flu vaccine at this visit  Patient is due colon cancer screening and was sent a Cologuard kit but did not process it properly.  We will have him do a fecal occult kit here in the office.    12/7  patient seen in direct follow-up on arrival blood pressure 133/95 on recheck it remains elevated.  Patient has had a prior stroke involving the cerebellum on the right affecting left side with left-sided weakness and dizziness that is persisting.  Patient is having difficulty with ambulation and would like a cane for this.  He had not yet gotten much physical therapy and needs another referral for this.  He has a rash in the left lower extremity and pain in the right knee.  Patient needs better blood pressure management. Patient does have type 2 diabetes on metformin alone with good control Patient is no longer smoking  PRECAUTIONS: Fall    SUBJECTIVE:                                                                                                                                                                                      SUBJECTIVE STATEMENT:  Still sore but less tight.   PAIN:  Are you having pain? Yes: NPRS scale: 10/10 Pain location: R hip/low back Pain description: ache Aggravating factors: activity Relieving factors: cream and heat   OBJECTIVE: (objective measures completed at initial evaluation unless otherwise dated)  DIAGNOSTIC FINDINGS:  None available   PATIENT SURVEYS:  FOTO 32(52 predicted)   SCREENING FOR RED FLAGS: negative   COGNITION: Overall cognitive status: Within functional limits for tasks assessed                          SENSATION: Not tested   MUSCLE LENGTH: Hamstrings: Right 45 deg; Left 45 deg Thomas test: NT   POSTURE: increased lumbar lordosis   PALPATION: TTP spinous processes of lumbar vertebrae   LUMBAR ROM:    AROM eval  Flexion 75%  Extension 50%  Right lateral flexion    Left lateral flexion    Right rotation    Left rotation     (Blank rows = not tested)   LOWER EXTREMITY ROM:      Passive  Right eval Left eval  Hip flexion 90d 120d  Hip extension      Hip abduction      Hip adduction      Hip internal rotation      Hip external rotation      Knee flexion      Knee extension 0d 0d  Ankle dorsiflexion      Ankle plantarflexion      Ankle inversion      Ankle eversion       (Blank rows = not tested)   LOWER EXTREMITY MMT:     MMT Right eval Left eval  Hip flexion  4 4  Hip extension 4 4  Hip abduction 4 4  Hip adduction      Hip internal rotation      Hip external rotation      Knee flexion 4 4  Knee extension 4 4  Ankle dorsiflexion      Ankle plantarflexion 4 4  Ankle inversion      Ankle eversion      Core 3 3      LUMBAR SPECIAL TESTS:  Straight leg raise test: inconclusive and Slump test: inconclusive   FUNCTIONAL  TESTS:  30 seconds chair stand test 1 with UE support   GAIT: Distance walked: 57ft x2 Assistive device utilized: None Level of assistance: Complete Independence Comments: slightly antalgic due to back pain   TODAY'S TREATMENT:     [facility] Adult PT Treatment:                                                DATE: 07/09/22 Therapeutic Exercise: Nustep L4 8 min Open book 10/10 holding 3# dumbbell QL stretch 30s x2 Bil Bridge with 5# ball 15x 90/90 30s x2 Hip flexor stretch 30s x2 R L over bolster Prone on elbows 2 min Manual Therapy: R piriformis release 8 min  [facility] Adult PT Treatment:                                                DATE: 07/04/22 Therapeutic Exercise: Nustep L4 6 min Open book 10/10 QL stretch 30s x2 Bil DKTC with 5# ball Bridge with 5# ball 15x Hip flexor stretch 30s x2 R L over bolster R piriformis release 2 min  [facility] Adult PT Treatment:                                                DATE: 06/27/22 Therapeutic Exercise: Nustep L4 8 min Seated hamstring stretch 30s x2 Bil Curl ups arms crossed 15x QL stretch 30s x2 Bil 90/90 30s x2 DKTC over ball 15x with OP  Bridge with 5# ball 15x Hip flexor stretch 30s x2 Bil(opposite knee straight)                                                                                                                          DATE: 06/11/22      PATIENT EDUCATION:  Education details: Discussed eval findings, rehab rationale and POC and patient is in agreement  Person educated: Patient Education method: Explanation Education comprehension: verbalized understanding and needs further education   HOME EXERCISE PROGRAM: Access Code: [email or web address] URL: [email or web address] Date: 06/27/2022  Prepared by: Sharlynn Oliphant  Exercises - Hooklying Single Knee to Chest Stretch  - 2 x daily - 5 x weekly - 1 sets - 2 reps - 30s hold - Curl Up with Arms Crossed  - 2 x daily - 5 x weekly - 1 sets - 10 reps - Seated Table  Hamstring Stretch  - 2 x daily - 5 x weekly - 1 sets - 2 reps - 30s hold - Sidelying Open Book Thoracic Lumbar Rotation and Extension  - 2 x daily - 5 x weekly - 1 sets - 10 reps - Supine Hip Flexor Stretch with Weight  - 2 x daily - 5 x weekly - 1 sets - 2 reps - 30s hold   ASSESSMENT:   CLINICAL IMPRESSION: Continued core tasks and stretching activities.  Added piriformis release on R.  Core weakness evident as well as decreased lumbar extension ROM evidenced by discomfort with prone position.    OBJECTIVE IMPAIRMENTS: Abnormal gait, decreased activity tolerance, decreased endurance, decreased knowledge of condition, decreased knowledge of use of DME, decreased mobility, difficulty walking, decreased ROM, decreased strength, increased muscle spasms, improper body mechanics, postural dysfunction, and pain.    ACTIVITY LIMITATIONS: carrying, lifting, bending, sitting, standing, squatting, sleeping, and stairs   PERSONAL FACTORS: Behavior pattern, Fitness, Past/current experiences, Time since onset of injury/illness/exacerbation, and 1 comorbidity: DM  are also affecting patient's functional outcome.    REHAB POTENTIAL: Fair based on chronicity,  and lack of imaging studies   CLINICAL DECISION MAKING: Evolving/moderate complexity   EVALUATION COMPLEXITY: Low     GOALS: Goals reviewed with patient? No   SHORT TERM GOALS: Target date: 06/25/2022     Patient to demonstrate independence in HEP  Baseline: [id number] Goal status: Met   2.  Increase PROM R hip flexion to 100d Baseline: 90d Goal status: INITIAL       LONG TERM GOALS: Target date: 07/09/2022     Increase SLR B to 60d to improve hamstring flexibility  Baseline: 45d B Goal status: INITIAL   2.  Decrease worst pain to 8/10 Baseline: 10/10 Goal status: INITIAL   3.  Improve core strength to 3+/5 to stabilize spine Baseline: 3/5 Goal status: INITIAL   4.  Increase 30s chair stand test to 5 reps with UE  support Baseline: 1 rep with UE support Goal status: INITIAL       PLAN:   PT FREQUENCY: 1-2x/week   PT DURATION: 4 weeks   PLANNED INTERVENTIONS: Therapeutic exercises, Therapeutic activity, Neuromuscular re-education, Balance training, Gait training, Patient/Family education, Self Care, Joint mobilization, Stair training, DME instructions, Spinal mobilization, Manual therapy, and Re-evaluation.   PLAN FOR NEXT SESSION: HEP review and update, core strength, LE and trunk flexibility, aerobic training.    Lanice Shirts, PT 07/09/2022, 3:42 PM

## 2022-07-11 ENCOUNTER — Other Ambulatory Visit: Payer: Self-pay | Admitting: Critical Care Medicine

## 2022-07-11 ENCOUNTER — Ambulatory Visit: Payer: 59

## 2022-07-11 DIAGNOSIS — M6281 Muscle weakness (generalized): Secondary | ICD-10-CM

## 2022-07-11 DIAGNOSIS — M5459 Other low back pain: Secondary | ICD-10-CM

## 2022-07-11 DIAGNOSIS — M5416 Radiculopathy, lumbar region: Secondary | ICD-10-CM

## 2022-07-11 NOTE — Therapy (Signed)
OUTPATIENT PHYSICAL THERAPY TREATMENT NOTE   Patient Name: Brian Moody. MRN: [id number] DOB:Nov 24, 1965, 57 y.o., male Today's Date: 07/11/2022  PCP: Elsie Stain, MD   REFERRING PROVIDER: Elsie Stain, MD    END OF SESSION:   PT End of Session - 07/11/22 1512     Visit Number 6    Number of Visits 8    Date for PT Re-Evaluation 08/06/22    Authorization Type UHC [facility]    Activity Tolerance Patient tolerated treatment well    Behavior During Therapy [facility] for tasks assessed/performed               Past Medical History:  Diagnosis Date   Avulsion fracture of distal fibula 06/02/2013   Right distal fibula occurred 04/30/2013    Dysuria 08/14/2020   Hordeolum externum left upper eyelid 06/29/2019   Hypertension    Persistent atrial fibrillation ([place])    a. newly diagnosed on 05/2015 admission. started on coumadin.    Pre-diabetes    a. HgA1c 6.3 05/2015   Tobacco abuse    Past Surgical History:  Procedure Laterality Date   IR CT HEAD LTD  03/24/2022   IR PERCUTANEOUS ART THROMBECTOMY/INFUSION INTRACRANIAL INC DIAG ANGIO  03/24/2022   IR US GUIDE VASC ACCESS RIGHT  03/24/2022   RADIOLOGY WITH ANESTHESIA N/A 03/24/2022   Procedure: IR WITH ANESTHESIA;  Surgeon: Radiologist, Medication, MD;  Location: [place];  Service: Radiology;  Laterality: N/A;   Patient Active Problem List   Diagnosis Date Noted   Eczema of lower leg 05/30/2022   Stroke (cerebrum) ([place]) 03/24/2022   Controlled type 2 diabetes mellitus with hyperglycemia, without long-term current use of insulin ([place]) 07/03/2021   Chronic pain of right ankle 08/14/2020   Erectile dysfunction 05/11/2019   Chronic atrial fibrillation ([place]) 09/03/2018   Obesity (BMI 30-39.9) 03/29/2016   Bilateral foot pain 07/20/2015   Essential hypertension    History of tobacco use    Vitamin D insufficiency 08/24/2013   Other hyperlipidemia 08/24/2013    REFERRING DIAG: M54.50 (ICD-10-CM) - Acute right-sided low back  pain without sciatica   THERAPY DIAG: Acute right-sided low back pain without sciatica   Rationale for Evaluation and Treatment Rehabilitation  PERTINENT HISTORY: 06/2021 [name]. presents for follow-up of hypertension.  Patient became confused as to whether he should be taking his blood pressure medicines daily or not but tickly when his insurance company sent him a mail order supply of prepackaged medications.  He has not been on his bisoprolol or amlodipine for about 2 weeks and on arrival blood pressure is 148/83.  When he has been compliant with his medicines blood pressure has been well controlled.  He currently does not have any symptoms referable to the blood pressure.  He is also still smoking 5 cigarettes daily.  Patient was in the emergency room last week for acute onset of pyelonephritis with left-sided flank pain.  He is now taking a course of Bactrim and has seen improvement in symptoms.  He is trying to hydrate himself more at this time.  Patient did declined the flu vaccine at this visit  Patient is due colon cancer screening and was sent a Cologuard kit but did not process it properly.  We will have him do a fecal occult kit here in the office.    12/7 patient seen in direct follow-up on arrival blood pressure 133/95 on recheck it remains elevated.  Patient has had a prior stroke  involving the cerebellum on the right affecting left side with left-sided weakness and dizziness that is persisting.  Patient is having difficulty with ambulation and would like a cane for this.  He had not yet gotten much physical therapy and needs another referral for this.  He has a rash in the left lower extremity and pain in the right knee.  Patient needs better blood pressure management. Patient does have type 2 diabetes on metformin alone with good control Patient is no longer smoking  PRECAUTIONS: Fall   SUBJECTIVE:                                                                                                                                                                                       SUBJECTIVE STATEMENT:  Still sore but less tight.  Piriformis release does not help and requests to omit that today.   PAIN:  Are you having pain? Yes: NPRS scale: 10/10 Pain location: R hip/low back Pain description: ache Aggravating factors: activity Relieving factors: cream and heat   OBJECTIVE: (objective measures completed at initial evaluation unless otherwise dated)  DIAGNOSTIC FINDINGS:  None available   PATIENT SURVEYS:  FOTO 32(52 predicted)   SCREENING FOR RED FLAGS: negative   COGNITION: Overall cognitive status: Within functional limits for tasks assessed                          SENSATION: Not tested   MUSCLE LENGTH: Hamstrings: Right 45 deg; Left 45 deg Thomas test: NT   POSTURE: increased lumbar lordosis   PALPATION: TTP spinous processes of lumbar vertebrae   LUMBAR ROM:    AROM eval  Flexion 75%  Extension 50%  Right lateral flexion    Left lateral flexion    Right rotation    Left rotation     (Blank rows = not tested)   LOWER EXTREMITY ROM:      Passive  Right eval Left eval  Hip flexion 90d 120d  Hip extension      Hip abduction      Hip adduction      Hip internal rotation      Hip external rotation      Knee flexion      Knee extension 0d 0d  Ankle dorsiflexion      Ankle plantarflexion      Ankle inversion      Ankle eversion       (Blank rows = not tested)   LOWER EXTREMITY MMT:     MMT Right eval Left eval  Hip flexion 4 4  Hip extension 4 4  Hip abduction  4 4  Hip adduction      Hip internal rotation      Hip external rotation      Knee flexion 4 4  Knee extension 4 4  Ankle dorsiflexion      Ankle plantarflexion 4 4  Ankle inversion      Ankle eversion      Core 3 3      LUMBAR SPECIAL TESTS:  Straight leg raise test: inconclusive and Slump test: inconclusive   FUNCTIONAL TESTS:  30 seconds  chair stand test 1 with UE support   GAIT: Distance walked: 27ft x2 Assistive device utilized: None Level of assistance: Complete Independence Comments: slightly antalgic due to back pain   TODAY'S TREATMENT:   [facility] Adult PT Treatment:                                                DATE: 07/11/22 Therapeutic Exercise: Nustep L4 8 min Open book 10/10 holding 4# dumbbell QL stretch 30s x2 Bil Bridge with 5# ball 15x DKTC w/5# ball 15x 90/90 30s x2 Hip flexor stretch 30s x2 R L over bolster Prone on elbows 2 min Piriformis stretch crossbody 30s x2 Bil Manual Therapy: SNAGs to L5-1, 5s hold 2-3 reps STM to B QL   [facility] Adult PT Treatment:                                                DATE: 07/09/22 Therapeutic Exercise: Nustep L4 8 min Open book 10/10 holding 3# dumbbell QL stretch 30s x2 Bil Bridge with 5# ball 15x 90/90 30s x2 Hip flexor stretch 30s x2 R L over bolster Prone on elbows 2 min Manual Therapy: R piriformis release 8 min  [facility] Adult PT Treatment:                                                DATE: 07/04/22 Therapeutic Exercise: Nustep L4 6 min Open book 10/10 QL stretch 30s x2 Bil DKTC with 5# ball Bridge with 5# ball 15x Hip flexor stretch 30s x2 R L over bolster R piriformis release 2 min  [facility] Adult PT Treatment:                                                DATE: 06/27/22 Therapeutic Exercise: Nustep L4 8 min Seated hamstring stretch 30s x2 Bil Curl ups arms crossed 15x QL stretch 30s x2 Bil 90/90 30s x2 DKTC over ball 15x with OP  Bridge with 5# ball 15x Hip flexor stretch 30s x2 Bil(opposite knee straight)  DATE: 06/11/22      PATIENT EDUCATION:  Education details: Discussed eval findings, rehab rationale and POC and patient is in agreement  Person educated: Patient Education method: Explanation Education comprehension:  verbalized understanding and needs further education   HOME EXERCISE PROGRAM: Access Code: [email or web address] URL: [email or web address] Date: 06/27/2022 Prepared by: Gustavus Bryant  Exercises - Hooklying Single Knee to Chest Stretch  - 2 x daily - 5 x weekly - 1 sets - 2 reps - 30s hold - Curl Up with Arms Crossed  - 2 x daily - 5 x weekly - 1 sets - 10 reps - Seated Table Hamstring Stretch  - 2 x daily - 5 x weekly - 1 sets - 2 reps - 30s hold - Sidelying Open Book Thoracic Lumbar Rotation and Extension  - 2 x daily - 5 x weekly - 1 sets - 10 reps - Supine Hip Flexor Stretch with Weight  - 2 x daily - 5 x weekly - 1 sets - 2 reps - 30s hold   ASSESSMENT:   CLINICAL IMPRESSION: Today's session added piriformis stretching to address tightness as release techniques not helping.  STGs met.  Added PA mobs to L5-1 as well as STM to B multifidi.      OBJECTIVE IMPAIRMENTS: Abnormal gait, decreased activity tolerance, decreased endurance, decreased knowledge of condition, decreased knowledge of use of DME, decreased mobility, difficulty walking, decreased ROM, decreased strength, increased muscle spasms, improper body mechanics, postural dysfunction, and pain.    ACTIVITY LIMITATIONS: carrying, lifting, bending, sitting, standing, squatting, sleeping, and stairs   PERSONAL FACTORS: Behavior pattern, Fitness, Past/current experiences, Time since onset of injury/illness/exacerbation, and 1 comorbidity: DM  are also affecting patient's functional outcome.    REHAB POTENTIAL: Fair based on chronicity,  and lack of imaging studies   CLINICAL DECISION MAKING: Evolving/moderate complexity   EVALUATION COMPLEXITY: Low     GOALS: Goals reviewed with patient? No   SHORT TERM GOALS: Target date: 06/25/2022     Patient to demonstrate independence in HEP  Baseline: [id number] Goal status: Met   2.  Increase PROM R hip flexion to 100d Baseline: 110d Goal status: Met      LONG TERM GOALS:  Target date: 07/09/2022     Increase SLR B to 60d to improve hamstring flexibility  Baseline: 45d B Goal status: INITIAL   2.  Decrease worst pain to 8/10 Baseline: 10/10 Goal status: INITIAL   3.  Improve core strength to 3+/5 to stabilize spine Baseline: 3/5 Goal status: INITIAL   4.  Increase 30s chair stand test to 5 reps with UE support Baseline: 1 rep with UE support Goal status: INITIAL       PLAN:   PT FREQUENCY: 1-2x/week   PT DURATION: 4 weeks   PLANNED INTERVENTIONS: Therapeutic exercises, Therapeutic activity, Neuromuscular re-education, Balance training, Gait training, Patient/Family education, Self Care, Joint mobilization, Stair training, DME instructions, Spinal mobilization, Manual therapy, and Re-evaluation.   PLAN FOR NEXT SESSION: HEP review and update, core strength, LE and trunk flexibility, aerobic training.    Hildred Laser, PT 07/11/2022, 3:15 PM

## 2022-07-11 NOTE — Telephone Encounter (Signed)
Requested medication (s) are due for refill today - yes  Requested medication (s) are on the active medication list -yes  Future visit scheduled -yes  Last refill: 05/30/22 30g  Notes to clinic: non delegated Rx  Requested Prescriptions  Pending Prescriptions Disp Refills   clobetasol cream (TEMOVATE) 0.05 % [Pharmacy Med Name: CLOBETASOL 0.05% CREAM] 30 g 0    Sig: APPLY TO [place]     Not Delegated - Dermatology:  Corticosteroids Failed - 07/11/2022 10:33 AM      Failed - This refill cannot be delegated      Passed - Valid encounter within last 12 months    Recent Outpatient Visits           1 month ago Controlled type 2 diabetes mellitus with hyperglycemia, without long-term current use of insulin ([place])   [place] Elsie Stain, MD   6 months ago Controlled type 2 diabetes mellitus with hyperglycemia, without long-term current use of insulin [facility])   [facility] at [facility], Loraine Grip, PA-C   1 year ago Essential hypertension   [place], Patrick E, MD   1 year ago [place] Elsie Stain, MD   2 years ago Chronic pain of right ankle   [place], MD       Future Appointments             In 3 weeks Elsie Stain, MD [place]               Requested Prescriptions  Pending Prescriptions Disp Refills   clobetasol cream (TEMOVATE) 0.05 % [Pharmacy Med Name: CLOBETASOL 0.05% CREAM] 30 g 0    Sig: APPLY TO AFFECTED AREA TWICE A DAY     Not Delegated - Dermatology:  Corticosteroids Failed - 07/11/2022 10:33 AM      Failed - This refill cannot be delegated      Passed - Valid encounter within last 12 months    Recent Outpatient Visits           1 month ago Controlled type 2 diabetes mellitus with hyperglycemia, without long-term  current use of insulin ([place])   [place] Elsie Stain, MD   6 months ago Controlled type 2 diabetes mellitus with hyperglycemia, without long-term current use of insulin [facility])   Primary Care at [facility], Loraine Grip, PA-C   1 year ago Essential hypertension   [place], Patrick E, MD   1 year ago [place] Elsie Stain, MD   2 years ago Chronic pain of right ankle   [place], MD       Future Appointments             In 3 weeks Elsie Stain, MD [place]

## 2022-07-16 ENCOUNTER — Ambulatory Visit: Payer: 59

## 2022-07-16 DIAGNOSIS — M6281 Muscle weakness (generalized): Secondary | ICD-10-CM | POA: Diagnosis not present

## 2022-07-16 DIAGNOSIS — M5416 Radiculopathy, lumbar region: Secondary | ICD-10-CM | POA: Diagnosis not present

## 2022-07-16 DIAGNOSIS — M5459 Other low back pain: Secondary | ICD-10-CM

## 2022-07-16 NOTE — Therapy (Addendum)
OUTPATIENT PHYSICAL THERAPY TREATMENT NOTE/DC SUMMARY   Patient Name: Brian Moody. MRN: [id number] DOB:[birth year], 57 y.o., male Today's Date: 07/16/2022  PCP: Elsie Stain, MD   REFERRING PROVIDER: Elsie Stain, MD   PHYSICAL THERAPY DISCHARGE SUMMARY  Visits from Start of Care: 7  Current functional level related to goals / functional outcomes: Goals partially met   Remaining deficits: Pain and flexibility deficits   Education / Equipment: HEP   Patient agrees to discharge. Patient goals were partially met. Patient is being discharged due to being pleased with the current functional level.  END OF SESSION:   PT End of Session - 07/16/22 1439     Visit Number 7    Number of Visits 8    Date for PT Re-Evaluation 08/06/22    Authorization Type UHC MCR    PT Start Time 7169    PT Stop Time 1525    PT Time Calculation (min) 40 min    Activity Tolerance Patient tolerated treatment well    Behavior During Therapy [facility] for tasks assessed/performed               Past Medical History:  Diagnosis Date   Avulsion fracture of distal fibula 06/02/2013   Right distal fibula occurred 04/30/2013    Dysuria 08/14/2020   Hordeolum externum left upper eyelid 06/29/2019   Hypertension    Persistent atrial fibrillation ([place])    a. newly diagnosed on 05/2015 admission. started on coumadin.    Pre-diabetes    a. HgA1c 6.3 05/2015   Tobacco abuse    Past Surgical History:  Procedure Laterality Date   IR CT HEAD LTD  03/24/2022   IR PERCUTANEOUS ART THROMBECTOMY/INFUSION INTRACRANIAL INC DIAG ANGIO  03/24/2022   IR US GUIDE VASC ACCESS RIGHT  03/24/2022   RADIOLOGY WITH ANESTHESIA N/A 03/24/2022   Procedure: IR WITH ANESTHESIA;  Surgeon: Radiologist, Medication, MD;  Location: [place];  Service: Radiology;  Laterality: N/A;   Patient Active Problem List   Diagnosis Date Noted   Eczema of lower leg 05/30/2022   Stroke (cerebrum) ([place]) 03/24/2022   Controlled type 2  diabetes mellitus with hyperglycemia, without long-term current use of insulin ([place]) 07/03/2021   Chronic pain of right ankle 08/14/2020   Erectile dysfunction 05/11/2019   Chronic atrial fibrillation ([place]) 09/03/2018   Obesity (BMI 30-39.9) 03/29/2016   Bilateral foot pain 07/20/2015   Essential hypertension    History of tobacco use    Vitamin D insufficiency 08/24/2013   Other hyperlipidemia 08/24/2013    REFERRING DIAG: M54.50 (ICD-10-CM) - Acute right-sided low back pain without sciatica   THERAPY DIAG: Acute right-sided low back pain without sciatica   Rationale for Evaluation and Treatment Rehabilitation  PERTINENT HISTORY: 06/2021 [name]. presents for follow-up of hypertension.  Patient became confused as to whether he should be taking his blood pressure medicines daily or not but tickly when his insurance company sent him a mail order supply of prepackaged medications.  He has not been on his bisoprolol or amlodipine for about 2 weeks and on arrival blood pressure is 148/83.  When he has been compliant with his medicines blood pressure has been well controlled.  He currently does not have any symptoms referable to the blood pressure.  He is also still smoking 5 cigarettes daily.  Patient was in the emergency room last week for acute onset of pyelonephritis with left-sided flank pain.  He is now taking a course of Bactrim  and has seen improvement in symptoms.  He is trying to hydrate himself more at this time.  Patient did declined the flu vaccine at this visit  Patient is due colon cancer screening and was sent a Cologuard kit but did not process it properly.  We will have him do a fecal occult kit here in the office.    12/7 patient seen in direct follow-up on arrival blood pressure 133/95 on recheck it remains elevated.  Patient has had a prior stroke involving the cerebellum on the right affecting left side with left-sided weakness and dizziness that is persisting.   Patient is having difficulty with ambulation and would like a cane for this.  He had not yet gotten much physical therapy and needs another referral for this.  He has a rash in the left lower extremity and pain in the right knee.  Patient needs better blood pressure management. Patient does have type 2 diabetes on metformin alone with good control Patient is no longer smoking  PRECAUTIONS: Fall   SUBJECTIVE:                                                                                                                                                                                      SUBJECTIVE STATEMENT:  Reports tightness as main concern.  Feels he has muscle cramps in spinal muscles and has been eating bananas to help.   PAIN:  Are you having pain? Yes: NPRS scale: 10/10 Pain location: R hip/low back Pain description: ache Aggravating factors: activity Relieving factors: cream and heat   OBJECTIVE: (objective measures completed at initial evaluation unless otherwise dated)  DIAGNOSTIC FINDINGS:  None available   PATIENT SURVEYS:  FOTO 32(52 predicted)   SCREENING FOR RED FLAGS: negative   COGNITION: Overall cognitive status: Within functional limits for tasks assessed                          SENSATION: Not tested   MUSCLE LENGTH: Hamstrings: Right 45 deg; Left 45 deg Thomas test: NT   POSTURE: increased lumbar lordosis   PALPATION: TTP spinous processes of lumbar vertebrae   LUMBAR ROM:    AROM eval  Flexion 75%  Extension 50%  Right lateral flexion    Left lateral flexion    Right rotation    Left rotation     (Blank rows = not tested)   LOWER EXTREMITY ROM:      Passive  Right eval Left eval  Hip flexion 90d 120d  Hip extension      Hip abduction      Hip adduction  Hip internal rotation      Hip external rotation      Knee flexion      Knee extension 0d 0d  Ankle dorsiflexion      Ankle plantarflexion      Ankle inversion      Ankle  eversion       (Blank rows = not tested)   LOWER EXTREMITY MMT:     MMT Right eval Left eval  Hip flexion 4 4  Hip extension 4 4  Hip abduction 4 4  Hip adduction      Hip internal rotation      Hip external rotation      Knee flexion 4 4  Knee extension 4 4  Ankle dorsiflexion      Ankle plantarflexion 4 4  Ankle inversion      Ankle eversion      Core 3 3      LUMBAR SPECIAL TESTS:  Straight leg raise test: inconclusive and Slump test: inconclusive   FUNCTIONAL TESTS:  30 seconds chair stand test 1 with UE support   GAIT: Distance walked: 50ft x2 Assistive device utilized: None Level of assistance: Complete Independence Comments: slightly antalgic due to back pain   TODAY'S TREATMENT:   [facility] Adult PT Treatment:                                                DATE: 07/16/22 Therapeutic Exercise: Nustep L6 8 min Open book 10/10 holding 5# dumbbell QL stretch 30s x2 Bil Bridge with 5# ball 15x DKTC w/5# ball 15x 90/90 30s x2 Hip flexor stretch 30s x2 over bolster Prone on elbows 2 min Piriformis stretch crossbody 30s x2 Bil Manual Therapy: SNAGs to L5-1, 5s hold 2-3 reps STM to B QL  [facility] Adult PT Treatment:                                                DATE: 07/11/22 Therapeutic Exercise: Nustep L4 8 min Open book 10/10 holding 4# dumbbell QL stretch 30s x2 Bil Bridge with 5# ball 15x DKTC w/5# ball 15x 90/90 30s x2 Hip flexor stretch 30s x2 R L over bolster Prone on elbows 2 min Piriformis stretch crossbody 30s x2 Bil Manual Therapy: SNAGs to L5-1, 5s hold 2-3 reps STM to B QL   [facility] Adult PT Treatment:                                                DATE: 07/09/22 Therapeutic Exercise: Nustep L4 8 min Open book 10/10 holding 3# dumbbell QL stretch 30s x2 Bil Bridge with 5# ball 15x 90/90 30s x2 Hip flexor stretch 30s x2 R L over bolster Prone on elbows 2 min Manual Therapy: R piriformis release 8 min      PATIENT EDUCATION:  Education  details: Discussed eval findings, rehab rationale and POC and patient is in agreement  Person educated: Patient Education method: Explanation Education comprehension: verbalized understanding and needs further education   HOME EXERCISE PROGRAM: Access Code: [email or web address] URL: [email or web address] Date: 06/27/2022 Prepared by: Gustavus Bryant  Exercises - Hooklying Single Knee to Chest Stretch  - 2 x daily - 5 x weekly - 1 sets - 2 reps - 30s hold - Curl Up with Arms Crossed  - 2 x daily - 5 x weekly - 1 sets - 10 reps - Seated Table Hamstring Stretch  - 2 x daily - 5 x weekly - 1 sets - 2 reps - 30s hold - Sidelying Open Book Thoracic Lumbar Rotation and Extension  - 2 x daily - 5 x weekly - 1 sets - 10 reps - Supine Hip Flexor Stretch with Weight  - 2 x daily - 5 x weekly - 1 sets - 2 reps - 30s hold   ASSESSMENT:   CLINICAL IMPRESSION: Continued core strengthening, added resistance to aerobic work, emphasized full ROM when stretching.  STM to B multifidi following PA mobs.  Patient showing more erect posture following interventions today.    OBJECTIVE IMPAIRMENTS: Abnormal gait, decreased activity tolerance, decreased endurance, decreased knowledge of condition, decreased knowledge of use of DME, decreased mobility, difficulty walking, decreased ROM, decreased strength, increased muscle spasms, improper body mechanics, postural dysfunction, and pain.    ACTIVITY LIMITATIONS: carrying, lifting, bending, sitting, standing, squatting, sleeping, and stairs   PERSONAL FACTORS: Behavior pattern, Fitness, Past/current experiences, Time since onset of injury/illness/exacerbation, and 1 comorbidity: DM  are also affecting patient's functional outcome.    REHAB POTENTIAL: Fair based on chronicity,  and lack of imaging studies   CLINICAL DECISION MAKING: Evolving/moderate complexity   EVALUATION COMPLEXITY: Low     GOALS: Goals reviewed with patient? No   SHORT TERM GOALS:  Target date: 06/25/2022     Patient to demonstrate independence in HEP  Baseline: [id number] Goal status: Met   2.  Increase PROM R hip flexion to 100d Baseline: 110d Goal status: Met      LONG TERM GOALS: Target date: 07/09/2022     Increase SLR B to 60d to improve hamstring flexibility  Baseline: 45d B Goal status: INITIAL   2.  Decrease worst pain to 8/10 Baseline: 10/10 Goal status: INITIAL   3.  Improve core strength to 3+/5 to stabilize spine Baseline: 3/5 Goal status: INITIAL   4.  Increase 30s chair stand test to 5 reps with UE support Baseline: 1 rep with UE support Goal status: INITIAL       PLAN:   PT FREQUENCY: 1-2x/week   PT DURATION: 4 weeks   PLANNED INTERVENTIONS: Therapeutic exercises, Therapeutic activity, Neuromuscular re-education, Balance training, Gait training, Patient/Family education, Self Care, Joint mobilization, Stair training, DME instructions, Spinal mobilization, Manual therapy, and Re-evaluation.   PLAN FOR NEXT SESSION: HEP review and update, core strength, LE and trunk flexibility, aerobic training.  Check LTGs and extend 1w4    Lanice Shirts, PT 07/16/2022, 3:42 PM

## 2022-07-18 ENCOUNTER — Ambulatory Visit: Payer: 59

## 2022-07-26 ENCOUNTER — Ambulatory Visit: Payer: 59 | Attending: Critical Care Medicine

## 2022-07-26 ENCOUNTER — Telehealth: Payer: Self-pay

## 2022-07-26 NOTE — Telephone Encounter (Signed)
TC due to missed visit, spoke directly to patient.  He had a family emergency and was at the hospital.  Declined to schedule additional visits as he was pleased with progress to date.

## 2022-08-01 ENCOUNTER — Encounter: Payer: Self-pay | Admitting: Critical Care Medicine

## 2022-08-01 ENCOUNTER — Ambulatory Visit: Payer: 59 | Attending: Critical Care Medicine | Admitting: Critical Care Medicine

## 2022-08-01 VITALS — BP 140/80 | HR 98 | Wt 247.2 lb

## 2022-08-01 DIAGNOSIS — E1165 Type 2 diabetes mellitus with hyperglycemia: Secondary | ICD-10-CM

## 2022-08-01 DIAGNOSIS — N529 Male erectile dysfunction, unspecified: Secondary | ICD-10-CM | POA: Diagnosis not present

## 2022-08-01 DIAGNOSIS — I10 Essential (primary) hypertension: Secondary | ICD-10-CM

## 2022-08-01 DIAGNOSIS — E559 Vitamin D deficiency, unspecified: Secondary | ICD-10-CM

## 2022-08-01 DIAGNOSIS — E7849 Other hyperlipidemia: Secondary | ICD-10-CM | POA: Diagnosis not present

## 2022-08-01 DIAGNOSIS — I482 Chronic atrial fibrillation, unspecified: Secondary | ICD-10-CM

## 2022-08-01 MED ORDER — TADALAFIL 5 MG PO TABS: 5.0000 mg | ORAL_TABLET | Freq: Every day | ORAL | 11 refills | Status: DC | PRN

## 2022-08-01 MED ORDER — BISOPROLOL FUMARATE 10 MG PO TABS: 20.0000 mg | ORAL_TABLET | Freq: Every day | ORAL | 2 refills | Status: DC

## 2022-08-01 NOTE — Assessment & Plan Note (Signed)
Continue with therapy

## 2022-08-01 NOTE — Patient Instructions (Signed)
Increase bisoprolol to 20 mg daily you will take 2 of the tablets daily and the refill be suggested to have that increase dose for 2 tablets daily this is for blood pressure  No change in amlodipine no change in other medications  Labs today include a urine study for protein and a lab to check your kidneys and potassium  Monitor your blood pressure at least 2-3 times a week  I did send a low-dose of an erectile dysfunction medication that might be less expensive for you called Cialis  Return to Dr. Joya Gaskins 3 months and please see our clinical pharmacist Lurena Joiner in 6 weeks he will call to make appointment this is for blood pressure follow-up

## 2022-08-01 NOTE — Progress Notes (Signed)
Established Patient Office Visit  Subjective:  Patient ID: Brian Moody., male    DOB: [birth year]  Age: 57 y.o. MRN: [id number]  CC:  Chief Complaint  Patient presents with   Hypertension    HPI 06/2021 Brian Moody. presents for follow-up of hypertension.  Patient became confused as to whether he should be taking his blood pressure medicines daily or not but tickly when his insurance company sent him a mail order supply of prepackaged medications.  He has not been on his bisoprolol or amlodipine for about 2 weeks and on arrival blood pressure is 148/83.  When he has been compliant with his medicines blood pressure has been well controlled.  He currently does not have any symptoms referable to the blood pressure.  He is also still smoking 5 cigarettes daily.  Patient was in the emergency room last week for acute onset of pyelonephritis with left-sided flank pain.  He is now taking a course of Bactrim and has seen improvement in symptoms.  He is trying to hydrate himself more at this time.  Patient did declined the flu vaccine at this visit  Patient is due colon cancer screening and was sent a Cologuard kit but did not process it properly.  We will have him do a fecal occult kit here in the office.   12/7 patient seen in direct follow-up on arrival blood pressure 133/95 on recheck it remains elevated.  Patient has had a prior stroke involving the cerebellum on the right affecting left side with left-sided weakness and dizziness that is persisting.  Patient is having difficulty with ambulation and would like a cane for this.  He had not yet gotten much physical therapy and needs another referral for this.  He has a rash in the left lower extremity and pain in the right knee.  Patient needs better blood pressure management. Patient does have type 2 diabetes on metformin alone with good control Patient is no longer smoking  Below is a copy of the recent discharge Adm 02/2022 for  CVA: Date of Admission: 03/23/2022 Date of Discharge: 03/25/2022   Attending Physician:  Stroke, Md, MD, Stroke MD Patient's PCP:  Storm Frisk, MD   DISCHARGE DIAGNOSIS:  Principal Problem:   Stroke:  right MCA stroke s/p TNK and IR with TICI2c, likely due to afib not compliant with eliquis   Active Problems:   hyperlipidemia   Essential hypertension   Tobacco use   Obesity (BMI 30-39.9)   Chronic atrial fibrillation (HCC)   Controlled type 2 diabetes mellitus with hyperglycemia   Non-traumatic subconjunctival hemorrhage, bilateral   Medication List       STOP taking these medications     albuterol 108 (90 Base) MCG/ACT inhaler Commonly known as: VENTOLIN HFA    Moderna COVID-19 Bivalent 50 MCG/0.5ML injection Generic drug: COVID-19 mRNA bivalent vaccine (Moderna)           TAKE these medications     5 Series BP Monitor Devi 1 Units by Does not apply route daily. Measure blood pressure daily    acetaminophen 500 MG tablet Commonly known as: TYLENOL Take 2 tablets (1,000 mg total) by mouth every 6 (six) hours as needed for headache.    amLODipine 10 MG tablet Commonly known as: NORVASC Take 1 tablet (10 mg total) by mouth daily. Start taking on: March 26, 2022    atorvastatin 20 MG tablet Commonly known as: LIPITOR Take 1 tablet (20 mg total) by mouth  daily. Start taking on: March 26, 2022 What changed:  medication strength how much to take    bisoprolol 10 MG tablet Commonly known as: ZEBETA Take 1 tablet (10 mg total) by mouth daily. Start taking on: March 26, 2022    blood glucose meter kit and supplies Kit Dispense based on patient and insurance preference. Use up to four times daily as directed. (FOR ICD-9 250.00, 250.01).    cyclobenzaprine 5 MG tablet Commonly known as: FLEXERIL Take 1 tablet (5 mg total) by mouth 3 (three) times daily as needed for muscle spasms.    Eliquis 5 MG Tabs tablet Generic drug: apixaban Take 1 tablet by  mouth twice daily. Appt needed What changed: See the new instructions.    metFORMIN 500 MG tablet Commonly known as: GLUCOPHAGE Take 1 tablet (500 mg total) by mouth daily with breakfast.    polyvinyl alcohol 1.4 % ophthalmic solution Commonly known as: LIQUIFILM TEARS Place 1 drop into both eyes as needed for dry eyes.    Vitamin D (Ergocalciferol) 1.25 MG (50000 UNIT) Caps capsule Commonly known as: DRISDOL Take 1 capsule (50,000 Units total) by mouth every 7 (seven) days.             LABORATORY STUDIES CBC Labs (Brief)          Component Value Date/Time    WBC 7.6 03/25/2022 0259    RBC 4.56 03/25/2022 0259    HGB 13.7 03/25/2022 0259    HGB 16.2 08/14/2020 1519    HCT 40.6 03/25/2022 0259    HCT 49.2 08/14/2020 1519    PLT 185 03/25/2022 0259    PLT 244 08/14/2020 1519    MCV 89.0 03/25/2022 0259    MCV 90 08/14/2020 1519    MCH 30.0 03/25/2022 0259    MCHC 33.7 03/25/2022 0259    RDW 14.5 03/25/2022 0259    RDW 13.5 08/14/2020 1519    LYMPHSABS 2.9 03/23/2022 2315    LYMPHSABS 3.2 (H) 08/14/2020 1519    MONOABS 0.8 03/23/2022 2315    EOSABS 0.1 03/23/2022 2315    EOSABS 0.1 08/14/2020 1519    BASOSABS 0.0 03/23/2022 2315    BASOSABS 0.0 08/14/2020 1519      CMP Labs (Brief)          Component Value Date/Time    NA 140 03/25/2022 0259    NA 138 01/09/2022 1443    K 3.4 (L) 03/25/2022 0259    CL 103 03/25/2022 0259    CO2 26 03/25/2022 0259    GLUCOSE 120 (H) 03/25/2022 0259    BUN 8 03/25/2022 0259    BUN 11 01/09/2022 1443    CREATININE 1.13 03/25/2022 0259    CREATININE 1.15 12/04/2015 1731    CALCIUM 8.5 (L) 03/25/2022 0259    PROT 7.6 03/23/2022 2315    PROT 7.1 01/09/2022 1443    ALBUMIN 3.5 03/23/2022 2315    ALBUMIN 4.1 01/09/2022 1443    AST 23 03/23/2022 2315    ALT 15 03/23/2022 2315    ALKPHOS 75 03/23/2022 2315    BILITOT 0.4 03/23/2022 2315    BILITOT <0.2 01/09/2022 1443    GFRNONAA >60 03/25/2022 0259    GFRNONAA 74  12/04/2015 1731    GFRAA 89 08/14/2020 1519    GFRAA 85 12/04/2015 1731      COAGS Recent Labs       Lab Results  Component Value Date    INR 1.1 03/23/2022    INR  1.3 (H) 09/01/2018    INR 1.02 06/23/2015      Lipid Panel Labs (Brief)          Component Value Date/Time    CHOL 124 03/24/2022 0249    CHOL 185 08/14/2020 1519    TRIG 40 03/24/2022 0249    HDL 34 (L) 03/24/2022 0249    HDL 46 08/14/2020 1519    CHOLHDL 3.6 03/24/2022 0249    VLDL 8 03/24/2022 0249    LDLCALC 82 03/24/2022 0249    LDLCALC 119 (H) 08/14/2020 1519      HgbA1C  Recent Labs       Lab Results  Component Value Date    HGBA1C 5.8 (H) 03/24/2022      Urinalysis Labs (Brief)          Component Value Date/Time    COLORURINE AMBER (A) 04/18/2016 1722    APPEARANCEUR Clear 08/14/2020 1519    LABSPEC >=1.030 06/28/2021 1634    PHURINE 5.5 06/28/2021 1634    GLUCOSEU NEGATIVE 06/28/2021 1634    HGBUR TRACE (A) 06/28/2021 1634    BILIRUBINUR SMALL (A) 06/28/2021 1634    BILIRUBINUR Negative 08/14/2020 1519    KETONESUR TRACE (A) 06/28/2021 1634    PROTEINUR NEGATIVE 06/28/2021 1634    UROBILINOGEN 0.2 06/28/2021 1634    NITRITE NEGATIVE 06/28/2021 1634    LEUKOCYTESUR SMALL (A) 06/28/2021 1634      Urine Drug Screen  Labs (Brief)  No results found for: "LABOPIA", "COCAINSCRNUR", "LABBENZ", "AMPHETMU", "THCU", "LABBARB"    Alcohol Level Labs (Brief)          Component Value Date/Time    ETH <10 03/23/2022 2315              HISTORY OF PRESENT ILLNESS       HOSPITAL COURSE  Mr. Taim Wurm. is a 57 y.o. male with history of atrial fibrillation on Eliquis with patient having not taken this medication for about two weeks presenting with acute onset dizziness and left sided weakness with a fall.  He was given TNK (he had missed multiple doses of his Eliquis) and taken to IR for mechanical thrombectomy of the right MCA.  TICI 2c flow was achieved   Stroke:  right MCA  stroke s/p TNK and IR with TICI2c, likely due to afib not compliant with eliquis Code Stroke CT head No acute abnormality. ASPECTS 10.    CTA head & neck occlusion of distal right M2 IR showed right M3 occlusion x2, status post TICI2c reperfusion MRI R MCA scattered infarcts MRA previously noted occluded distal right M2 now appears patent, and flow is seen in both the superior and inferior M3 branch 2D Echo EF 60 to 65% LDL 82 HgbA1c 5.8 VTE prophylaxis - SCDs Eliquis (apixaban) daily prior to admission (but patient not compliant with medication), now on ASA. Plan to restart eliquis in am Therapy recommendations:  none Disposition:  home   Hypertension Home meds:  amlodipine 10 mg daily, bisoprolol 10 mg daily Stable Long-term BP goal normotensive   Atrial fibrillation Patient has persistent atrial fibrillation Was prescribed Eliquis but had not taken this medication for past two weeks and when he was taken he took only once a day Discussed importance of taking Eliquis as prescribed Now on ASA, will start Eliquis in am   Hyperlipidemia Home meds: Lipitor 10 LDL 82, goal < 70 On atorvastatin 20 mg daily  High intensity statin not indicated as LDL near goal Continue  statin at discharge   Tobacco abuse Current smoker Smoking cessation counseling provided Pt is willing to quit   Other Stroke Risk Factors Obesity, Body mass index is 35.93 kg/m., BMI >/= 30 associated with increased stroke risk, recommend weight loss, diet and exercise as appropriate    Other Active Problems Bilateral eye subconjunctival hemorrhage post TNK       DISCHARGE EXAM Blood pressure (!) 157/113, pulse (!) 109, temperature 98.2 F (36.8 C), temperature source Oral, resp. rate 20, height 5' 10.5" (1.791 m), SpO2 96 %.   08/01/2022 Patient seen in return follow-up on arrival blood pressure is mildly elevated 143/90 patient has no symptoms from this.  He he does have low back pain he is finished a  course of physical therapy.  He has no other real complaints.  He has not been back in the hospital.  He has had no further stroke symptoms.  He is taking his preventive therapy.  Maintains amlodipine 10 mg daily and bisoprolol 10 mg daily he is requesting erectile dysfunction medicine he has just been released from physical therapy Past Medical History:  Diagnosis Date   Avulsion fracture of distal fibula 06/02/2013   Right distal fibula occurred 04/30/2013    Dysuria 08/14/2020   Hordeolum externum left upper eyelid 06/29/2019   Hypertension    Persistent atrial fibrillation ([place])    a. newly diagnosed on 05/2015 admission. started on coumadin.    Pre-diabetes    a. HgA1c 6.3 05/2015   Tobacco abuse     Past Surgical History:  Procedure Laterality Date   IR CT HEAD LTD  03/24/2022   IR PERCUTANEOUS ART THROMBECTOMY/INFUSION INTRACRANIAL INC DIAG ANGIO  03/24/2022   IR US GUIDE VASC ACCESS RIGHT  03/24/2022   RADIOLOGY WITH ANESTHESIA N/A 03/24/2022   Procedure: IR WITH ANESTHESIA;  Surgeon: Radiologist, Medication, MD;  Location: [facility];  Service: Radiology;  Laterality: N/A;    Family History  Problem Relation Age of Onset   Hypertension Father    Diabetes Maternal Grandmother    Hypertension Maternal Grandmother    Diabetes Maternal Grandfather    Hypertension Maternal Grandfather     Social History   Socioeconomic History   Marital status: Married    Spouse name: Not on file   Number of children: Not on file   Years of education: Not on file   Highest education level: Not on file  Occupational History   Not on file  Tobacco Use   Smoking status: Former    Packs/day: 0.10    Types: Cigarettes    Passive exposure: Never   Smokeless tobacco: Never  Vaping Use   Vaping Use: Never used  Substance and Sexual Activity   Alcohol use: No   Drug use: No   Sexual activity: Not Currently  Other Topics Concern   Not on file  Social History Narrative   Not on file   Social  Determinants of Health   Financial Resource Strain: Low Risk  (05/22/2022)   Overall Financial Resource Strain (CARDIA)    Difficulty of Paying Living Expenses: Not hard at all  Food Insecurity: No Food Insecurity (05/22/2022)   Hunger Vital Sign    Worried About Running Out of Food in the Last Year: Never true    [place] in the Last Year: Never true  Transportation Needs: No Transportation Needs (05/22/2022)   PRAPARE - Hydrologist (Medical): No    Lack of Transportation (  Non-Medical): No  Physical Activity: Inactive (05/22/2022)   Exercise Vital Sign    Days of Exercise per Week: 0 days    Minutes of Exercise per Session: 0 min  Stress: No Stress Concern Present (05/22/2022)   [employer] of Occupational Health - Occupational Stress Questionnaire    Feeling of Stress : Only a little  Social Connections: Not on file  Intimate Partner Violence: Not on file    Outpatient Medications Prior to Visit  Medication Sig Dispense Refill   amLODipine (NORVASC) 10 MG tablet Take 1 tablet (10 mg total) by mouth daily. 90 tablet 2   atorvastatin (LIPITOR) 20 MG tablet Take 1 tablet (20 mg total) by mouth daily. 90 tablet 2   clobetasol cream (TEMOVATE) 0.05 % APPLY TO AFFECTED AREA TWICE A DAY 30 g 0   cyclobenzaprine (FLEXERIL) 5 MG tablet Take 1 tablet (5 mg total) by mouth 3 (three) times daily as needed for muscle spasms. 60 tablet 2   polyvinyl alcohol (LIQUIFILM TEARS) 1.4 % ophthalmic solution Place 1 drop into both eyes as needed for dry eyes. 15 mL 0   bisoprolol (ZEBETA) 10 MG tablet Take 1 tablet (10 mg total) by mouth daily. 90 tablet 2   acetaminophen (TYLENOL) 500 MG tablet Take 2 tablets (1,000 mg total) by mouth every 6 (six) hours as needed for headache. 60 tablet 2   apixaban (ELIQUIS) 5 MG TABS tablet Take 1 tablet (5 mg total) by mouth 2 (two) times daily. 120 tablet 2   blood glucose meter kit and supplies KIT Dispense based on  patient and insurance preference. Use up to four times daily as directed. (FOR ICD-9 250.00, 250.01). 1 each 0   Blood Pressure Monitoring (5 SERIES BP MONITOR) DEVI 1 Units by Does not apply route daily. Measure blood pressure daily 1 each 0   diclofenac Sodium (VOLTAREN) 1 % GEL Apply 2 g topically 4 (four) times daily. To right knee 100 g 1   metFORMIN (GLUCOPHAGE) 500 MG tablet Take 1 tablet (500 mg total) by mouth daily with breakfast. 90 tablet 1   Vitamin D, Ergocalciferol, (DRISDOL) 1.25 MG (50000 UNIT) CAPS capsule Take 1 capsule (50,000 Units total) by mouth every 7 (seven) days. 4 capsule 2   No facility-administered medications prior to visit.    No Known Allergies  ROS Review of Systems  Constitutional: Negative.  Negative for chills, diaphoresis and fever.  HENT: Negative.  Negative for congestion, ear pain, hearing loss, nosebleeds, postnasal drip, rhinorrhea, sinus pressure, sore throat, tinnitus, trouble swallowing and voice change.   Eyes: Negative.  Negative for photophobia and redness.  Respiratory: Negative.  Negative for apnea, cough, choking, chest tightness, shortness of breath, wheezing and stridor.   Cardiovascular: Negative.  Negative for chest pain, palpitations and leg swelling.  Gastrointestinal: Negative.  Negative for abdominal distention, abdominal pain, blood in stool, constipation, diarrhea, nausea and vomiting.  Endocrine: Negative for polydipsia.  Genitourinary: Negative.  Negative for flank pain, frequency, hematuria and urgency.  Musculoskeletal:  Positive for back pain. Negative for arthralgias, myalgias and neck pain.  Skin: Negative.  Negative for rash.  Allergic/Immunologic: Negative.  Negative for environmental allergies and food allergies.  Neurological:  Negative for dizziness, tremors, seizures, syncope and headaches.  Hematological: Negative.  Negative for adenopathy. Does not bruise/bleed easily.  Psychiatric/Behavioral: Negative.  Negative  for agitation, sleep disturbance and suicidal ideas. The patient is not nervous/anxious.       Objective:    Physical Exam  Vitals reviewed.  Constitutional:      Appearance: Normal appearance. He is well-developed. He is obese. He is not diaphoretic.  HENT:     Head: Normocephalic and atraumatic.     Nose: No nasal deformity, septal deviation, mucosal edema or rhinorrhea.     Right Sinus: No maxillary sinus tenderness or frontal sinus tenderness.     Left Sinus: No maxillary sinus tenderness or frontal sinus tenderness.     Mouth/Throat:     Pharynx: No oropharyngeal exudate.  Eyes:     General: No scleral icterus.    Conjunctiva/sclera: Conjunctivae normal.     Pupils: Pupils are equal, round, and reactive to light.  Neck:     Thyroid: No thyromegaly.     Vascular: No carotid bruit or JVD.     Trachea: Trachea normal. No tracheal tenderness or tracheal deviation.  Cardiovascular:     Rate and Rhythm: Normal rate and regular rhythm.     Chest Wall: PMI is not displaced.     Pulses: Normal pulses. No decreased pulses.     Heart sounds: Normal heart sounds, S1 normal and S2 normal. Heart sounds not distant. No murmur heard.    No systolic murmur is present.     No diastolic murmur is present.     No friction rub. No gallop. No S3 or S4 sounds.  Pulmonary:     Effort: Pulmonary effort is normal. No tachypnea, accessory muscle usage or respiratory distress.     Breath sounds: Normal breath sounds. No stridor. No decreased breath sounds, wheezing, rhonchi or rales.  Chest:     Chest wall: No tenderness.  Abdominal:     General: Bowel sounds are normal. There is no distension.     Palpations: Abdomen is soft. Abdomen is not rigid.     Tenderness: There is no abdominal tenderness. There is right CVA tenderness. There is no left CVA tenderness, guarding or rebound.     Comments: Right costovertebral angle tenderness is improved from documentation in the emergency room   Musculoskeletal:        General: Normal range of motion.     Cervical back: Normal range of motion and neck supple. No edema, erythema or rigidity. No muscular tenderness. Normal range of motion.     Right lower leg: No edema.     Left lower leg: No edema.  Lymphadenopathy:     Head:     Right side of head: No submental or submandibular adenopathy.     Left side of head: No submental or submandibular adenopathy.     Cervical: No cervical adenopathy.  Skin:    General: Skin is warm and dry.     Coloration: Skin is not pale.     Findings: No rash.     Nails: There is no clubbing.  Neurological:     General: No focal deficit present.     Mental Status: He is alert and oriented to person, place, and time. Mental status is at baseline.     Sensory: No sensory deficit.  Psychiatric:        Mood and Affect: Mood normal.        Speech: Speech normal.        Behavior: Behavior normal.        Thought Content: Thought content normal.   Foot exam normal  BP (!) 140/80   Pulse 98   Wt 247 lb 3.2 oz (112.1 kg)   SpO2 100%   BMI  35.47 kg/m  Wt Readings from Last 3 Encounters:  08/01/22 247 lb 3.2 oz (112.1 kg)  05/30/22 249 lb 12.8 oz (113.3 kg)  05/22/22 250 lb (113.4 kg)     Health Maintenance Due  Topic Date Due   OPHTHALMOLOGY EXAM  Never done   DTaP/Tdap/Td (1 - Tdap) Never done   COLON CANCER SCREENING ANNUAL FOBT  Never done   Zoster Vaccines- Shingrix (1 of 2) Never done    There are no preventive care reminders to display for this patient.  Lab Results  Component Value Date   TSH 2.678 06/22/2015   Lab Results  Component Value Date   WBC 7.3 05/30/2022   HGB 13.9 05/30/2022   HCT 41.9 05/30/2022   MCV 86 05/30/2022   PLT 249 05/30/2022   Lab Results  Component Value Date   NA 141 05/30/2022   K 4.4 05/30/2022   CO2 24 05/30/2022   GLUCOSE 95 05/30/2022   BUN 12 05/30/2022   CREATININE 1.19 05/30/2022   BILITOT 0.5 05/30/2022   ALKPHOS 95 05/30/2022    AST 15 05/30/2022   ALT 13 05/30/2022   PROT 7.4 05/30/2022   ALBUMIN 4.0 05/30/2022   CALCIUM 9.1 05/30/2022   ANIONGAP 11 03/25/2022   EGFR 71 05/30/2022   Lab Results  Component Value Date   CHOL 115 05/30/2022   Lab Results  Component Value Date   HDL 43 05/30/2022   Lab Results  Component Value Date   LDLCALC 59 05/30/2022   Lab Results  Component Value Date   TRIG 55 05/30/2022   Lab Results  Component Value Date   CHOLHDL 2.7 05/30/2022   Lab Results  Component Value Date   HGBA1C 6.7 (H) 05/30/2022    SIGNIFICANT DIAGNOSTIC STUDIES  Imaging Results  MR BRAIN WO CONTRAST   Result Date: 03/24/2022 CLINICAL DATA:  Stroke follow-up, status post thrombectomy EXAM: MRI HEAD WITHOUT CONTRAST MRA HEAD WITHOUT CONTRAST TECHNIQUE: Multiplanar, multi-echo pulse sequences of the brain and surrounding structures were acquired without intravenous contrast. Angiographic images of the Circle of Willis were acquired using MRA technique without intravenous contrast. COMPARISON:  No prior MRI, correlation is made with CT head and CTA head and neck 03/23/2022 FINDINGS: MRI HEAD FINDINGS Brain: Restricted diffusion with ADC correlate in the right insula and posteroinferior frontal lobe (series 5, images 76-85), with additional more scattered foci in the right parietal and occipital lobe (series 5, images 71-88). These areas are associated with mildly increased T2 hyperintense signal. No acute hemorrhage, mass, mass effect, or midline shift. No hydrocephalus or extra-axial collection. Punctate foci of hemosiderin deposition in the posteroinferior right frontal lobe (series 14, image 36 and 44), right temporal lobe (series 14, images 26 and 29), and right occipital lobe (series 14, images 28 and 32), which may represent small microhemorrhages or thrombi. Vascular: Please see MRA findings below. Skull and upper cervical spine: Normal marrow signal. Sinuses/Orbits: No acute finding. Other:  The mastoids are well aerated. MRA HEAD FINDINGS Anterior circulation: Both internal carotid arteries are patent to the termini, without significant stenosis. Patent left A1. Aplastic right A1. Normal anterior communicating artery. Anterior cerebral arteries are patent to their distal aspects. No M1 stenosis or occlusion. Normal MCA bifurcations. Distal MCA branches perfused and symmetric. The previously noted occluded right M2 now appears patent, and flow is seen in both the superior and inferior M3 branching from it. Posterior circulation: Vertebral arteries patent to the vertebrobasilar junction without stenosis. Diminutive left V4.  Basilar patent to its distal aspect. Superior cerebellar arteries patent bilaterally. Patent P1 segments, diminutive on the right. Near fetal origin of the right PCA from a prominent right posterior communicating artery. The left posterior communicating artery is also patent. PCAs perfused to their distal aspects without stenosis. Anatomic variants: Near fetal origin of the right PCA. IMPRESSION: 1. Acute infarcts in the right insula and posteroinferior frontal lobe, with additional more scattered foci in the right parietal and occipital lobe. 2. The previously noted occluded distal right M2 now appears patent, and flow is seen in both the superior and inferior M3 branching from it. 3. No intracranial large vessel occlusion or significant stenosis. These results will be called to the ordering clinician or representative by the Radiologist Assistant, and communication documented in the PACS or [employer]. Electronically Signed   By: Wiliam Ke M.D.   On: 03/24/2022 23:29    MR ANGIO HEAD WO CONTRAST   Result Date: 03/24/2022 CLINICAL DATA:  Stroke follow-up, status post thrombectomy EXAM: MRI HEAD WITHOUT CONTRAST MRA HEAD WITHOUT CONTRAST TECHNIQUE: Multiplanar, multi-echo pulse sequences of the brain and surrounding structures were acquired without intravenous contrast.  Angiographic images of the Circle of Willis were acquired using MRA technique without intravenous contrast. COMPARISON:  No prior MRI, correlation is made with CT head and CTA head and neck 03/23/2022 FINDINGS: MRI HEAD FINDINGS Brain: Restricted diffusion with ADC correlate in the right insula and posteroinferior frontal lobe (series 5, images 76-85), with additional more scattered foci in the right parietal and occipital lobe (series 5, images 71-88). These areas are associated with mildly increased T2 hyperintense signal. No acute hemorrhage, mass, mass effect, or midline shift. No hydrocephalus or extra-axial collection. Punctate foci of hemosiderin deposition in the posteroinferior right frontal lobe (series 14, image 36 and 44), right temporal lobe (series 14, images 26 and 29), and right occipital lobe (series 14, images 28 and 32), which may represent small microhemorrhages or thrombi. Vascular: Please see MRA findings below. Skull and upper cervical spine: Normal marrow signal. Sinuses/Orbits: No acute finding. Other: The mastoids are well aerated. MRA HEAD FINDINGS Anterior circulation: Both internal carotid arteries are patent to the termini, without significant stenosis. Patent left A1. Aplastic right A1. Normal anterior communicating artery. Anterior cerebral arteries are patent to their distal aspects. No M1 stenosis or occlusion. Normal MCA bifurcations. Distal MCA branches perfused and symmetric. The previously noted occluded right M2 now appears patent, and flow is seen in both the superior and inferior M3 branching from it. Posterior circulation: Vertebral arteries patent to the vertebrobasilar junction without stenosis. Diminutive left V4. Basilar patent to its distal aspect. Superior cerebellar arteries patent bilaterally. Patent P1 segments, diminutive on the right. Near fetal origin of the right PCA from a prominent right posterior communicating artery. The left posterior communicating artery  is also patent. PCAs perfused to their distal aspects without stenosis. Anatomic variants: Near fetal origin of the right PCA. IMPRESSION: 1. Acute infarcts in the right insula and posteroinferior frontal lobe, with additional more scattered foci in the right parietal and occipital lobe. 2. The previously noted occluded distal right M2 now appears patent, and flow is seen in both the superior and inferior M3 branching from it. 3. No intracranial large vessel occlusion or significant stenosis. These results will be called to the ordering clinician or representative by the Radiologist Assistant, and communication documented in the PACS or [employer]. Electronically Signed   By: Wiliam Ke M.D.   On:  03/24/2022 23:29    ECHOCARDIOGRAM COMPLETE   Result Date: 03/24/2022    ECHOCARDIOGRAM REPORT   Patient Name:   Brian Salehillip A Geerdes Jr. Date of Exam: 03/24/2022 Medical Rec #:  [id number]            Height:       70.5 in Accession #:    [id number]           Weight:       254.0 lb Date of Birth:  09/09/1965             BSA:          2.323 m Patient Age:    56 years             BP:           147/82 mmHg Patient Gender: M                    HR:           71 bpm. Exam Location:  Inpatient Procedure: 2D Echo, Cardiac Doppler and Color Doppler Indications:    I163.9 Stroke  History:        Patient has prior history of Echocardiogram examinations, most                 recent 05/10/2016. Arrythmias:Atrial Fibrillation; Risk                 Factors:Hypertension and Diabetes. Tobacco abuse (From Hx).  Sonographer:    Celesta GentileBernard White RCS Referring Phys: [phone] MCNEILL P KIRKPATRICK IMPRESSIONS  1. Left ventricular ejection fraction, by estimation, is 60 to 65%. The left ventricle has normal function. The left ventricle has no regional wall motion abnormalities. There is mild concentric left ventricular hypertrophy. Diastolic function indeterminant due to Afib.  2. Right ventricular systolic function is mildly reduced. The right  ventricular size is normal. Tricuspid regurgitation signal is inadequate for assessing PA pressure.  3. Left atrial size was moderately dilated.  4. Right atrial size was mildly dilated.  5. The mitral valve is abnormal. Mild to moderate mitral valve regurgitation.  6. The aortic valve is tricuspid. There is mild thickening of the aortic valve. Aortic valve regurgitation is mild. Aortic valve sclerosis is present, with no evidence of aortic valve stenosis.  7. The inferior vena cava is dilated in size with <50% respiratory variability, suggesting right atrial pressure of 15 mmHg. Comparison(s): Compared to prior TTE report in 2017, there is no significant change. Conclusion(s)/Recommendation(s): No intracardiac source of embolism detected on this transthoracic study. Consider a transesophageal echocardiogram to exclude cardiac source of embolism if clinically indicated. FINDINGS  Left Ventricle: Left ventricular ejection fraction, by estimation, is 60 to 65%. The left ventricle has normal function. The left ventricle has no regional wall motion abnormalities. Definity contrast agent was given IV to delineate the left ventricular  endocardial borders. The left ventricular internal cavity size was normal in size. There is mild concentric left ventricular hypertrophy. Diastolic function indeterminant due to Afib. Right Ventricle: The right ventricular size is normal. Right vetricular wall thickness was not well visualized. Right ventricular systolic function is mildly reduced. Tricuspid regurgitation signal is inadequate for assessing PA pressure. Left Atrium: Left atrial size was moderately dilated. Right Atrium: Right atrial size was mildly dilated. Pericardium: There is no evidence of pericardial effusion. Mitral Valve: The mitral valve is abnormal. There is mild thickening of the mitral valve leaflet(s). Mild mitral annular calcification. Mild to moderate  mitral valve regurgitation. Tricuspid Valve: The tricuspid  valve is normal in structure. Tricuspid valve regurgitation is trivial. Aortic Valve: The aortic valve is tricuspid. There is mild thickening of the aortic valve. Aortic valve regurgitation is mild. Aortic valve sclerosis is present, with no evidence of aortic valve stenosis. Pulmonic Valve: The pulmonic valve was not well visualized. Pulmonic valve regurgitation is trivial. Aorta: The aortic root is normal in size and structure. Venous: The inferior vena cava is dilated in size with less than 50% respiratory variability, suggesting right atrial pressure of 15 mmHg. IAS/Shunts: The atrial septum is grossly normal.  LEFT VENTRICLE PLAX 2D LVIDd:         4.40 cm LVIDs:         2.90 cm LV PW:         1.20 cm LV IVS:        1.10 cm LVOT diam:     1.90 cm LV SV:         48 LV SV Index:   21 LVOT Area:     2.84 cm  RIGHT VENTRICLE TAPSE (M-mode): 1.6 cm LEFT ATRIUM             Index        RIGHT ATRIUM           Index LA diam:        4.70 cm 2.02 cm/m   RA Area:     26.10 cm LA Vol (A2C):   96.6 ml 41.59 ml/m  RA Volume:   91.40 ml  39.35 ml/m LA Vol (A4C):   75.1 ml 32.33 ml/m LA Biplane Vol: 92.9 ml 40.00 ml/m  AORTIC VALVE LVOT Vmax:   93.30 cm/s LVOT Vmean:  58.000 cm/s LVOT VTI:    0.169 m  AORTA Ao Root diam: 2.85 cm MITRAL VALVE MV Area (PHT): 4.80 cm     SHUNTS MV Decel Time: 158 msec     Systemic VTI:  0.17 m MV E velocity: 148.00 cm/s  Systemic Diam: 1.90 cm Laurance Flatten MD Electronically signed by Laurance Flatten MD Signature Date/Time: 03/24/2022/2:58:07 PM    Final     CT ANGIO HEAD NECK W WO CM (CODE STROKE)   Result Date: 03/23/2022 CLINICAL DATA:  Left facial droop, slurred speech EXAM: CT ANGIOGRAPHY HEAD AND NECK TECHNIQUE: Multidetector CT imaging of the head and neck was performed using the standard protocol during bolus administration of intravenous contrast. Multiplanar CT image reconstructions and MIPs were obtained to evaluate the vascular anatomy. Carotid stenosis measurements  (when applicable) are obtained utilizing NASCET criteria, using the distal internal carotid diameter as the denominator. RADIATION DOSE REDUCTION: This exam was performed according to the departmental dose-optimization program which includes automated exposure control, adjustment of the mA and/or kV according to patient size and/or use of iterative reconstruction technique. CONTRAST:  11mL OMNIPAQUE IOHEXOL 350 MG/ML SOLN COMPARISON:  No prior CTA available, correlation is made with CT head 03/23/2022 FINDINGS: CT HEAD FINDINGS For noncontrast findings, please see same day CT head. CTA NECK FINDINGS Aortic arch: Two-vessel arch with a common origin of the brachiocephalic and left common carotid arteries. Imaged portion shows no evidence of aneurysm or dissection. No significant stenosis of the major arch vessel origins. Right carotid system: No evidence of dissection, occlusion, or hemodynamically significant stenosis (greater than 50%). Left carotid system: No evidence of dissection, occlusion, or hemodynamically significant stenosis (greater than 50%). Vertebral arteries: No evidence of dissection, occlusion, or hemodynamically significant stenosis (greater  than 50%). Skeleton: No acute osseous abnormality. Other neck: No acute finding. Upper chest: No focal pulmonary opacity or pleural effusion. Review of the MIP images confirms the above findings CTA HEAD FINDINGS Anterior circulation: Both internal carotid arteries are patent to the termini, without significant stenosis. Patent left A1. Aplastic right A1. Normal anterior communicating artery. Anterior cerebral arteries are patent to their distal aspects. No M1 stenosis or occlusion. A right M2 appears occluded, just proximal to the bifurcation (series 7, image [id number]), with nonopacification of the more inferior M3 branch and opacification of the more superior branch. MCA branches otherwise perfused and symmetric. Posterior circulation: Vertebral arteries patent  to the vertebrobasilar junction without stenosis, although the left V4 is quite diminutive after the PICA takeoff. Posterior inferior cerebellar arteries patent proximally. Basilar patent to its distal aspect. Superior cerebellar arteries patent proximally. Patent P1 segments, diminutive on the right. Near fetal origin of the right PCA from a prominent right posterior communicating artery. The left posterior communicating artery is also patent. PCAs perfused to their distal aspects without stenosis. Venous sinuses: As permitted by contrast timing, patent. Anatomic variants: Near fetal origin of the right PCA. Review of the MIP images confirms the above findings IMPRESSION: 1. Occlusion of a distal right M2, near the bifurcation, with nonopacification of the more inferior M3 branch and opacification of the more superior branch. 2. Otherwise no intracranial hemodynamically significant stenosis. 3.  No hemodynamically significant stenosis in the neck. These findings were discussed by telephone on 03/23/2022 at 11:46 pm with provider MCNEILL KIRKPATRICK . Electronically Signed   By: Merilyn Baba M.D.   On: 03/23/2022 23:55    CT HEAD CODE STROKE WO CONTRAST   Result Date: 03/23/2022 CLINICAL DATA:  Code stroke. Left-sided weakness, facial droop, slurred speech EXAM: CT HEAD WITHOUT CONTRAST TECHNIQUE: Contiguous axial images were obtained from the base of the skull through the vertex without intravenous contrast. RADIATION DOSE REDUCTION: This exam was performed according to the departmental dose-optimization program which includes automated exposure control, adjustment of the mA and/or kV according to patient size and/or use of iterative reconstruction technique. COMPARISON:  None Available. FINDINGS: Brain: No evidence of acute infarction, hemorrhage, cerebral edema, mass, mass effect, or midline shift. No hydrocephalus or extra-axial collection. Vascular: No hyperdense vessel. Skull: Negative for fracture or  focal lesion. Sinuses/Orbits: No acute finding. Other: The mastoid air cells are well aerated. ASPECTS [facility] Stroke Program Early CT Score) - Ganglionic level infarction (caudate, lentiform nuclei, internal capsule, insula, M1-M3 cortex): 7 - Supraganglionic infarction (M4-M6 cortex): 3 Total score (0-10 with 10 being normal): 10 IMPRESSION: No evidence of acute intracranial process. ASPECTS is 10. Code stroke imaging results were communicated on 03/23/2022 at 11:33 pm to provider Dr. Leonel Ramsay via secure text paging. Electronically Signed   By: Merilyn Baba M.D.   On: 03/23/2022 23:33       Assessment & Plan:   Problem List Items Addressed This Visit       Cardiovascular and Mediastinum   Essential hypertension (Chronic)    Blood pressure not yet at goal Plan increase bisoprolol to 20 mg daily continue amlodipine as prescribed Bring patient back short-term follow-up and check renal function labs      Relevant Medications   bisoprolol (ZEBETA) 10 MG tablet   tadalafil (CIALIS) 5 MG tablet   Other Relevant Orders   Renal function panel with eGFR   Chronic atrial fibrillation (HCC) - Primary   Relevant Medications   bisoprolol (ZEBETA) 10  MG tablet   tadalafil (CIALIS) 5 MG tablet     Endocrine   Controlled type 2 diabetes mellitus with hyperglycemia, without long-term current use of insulin (HCC)    A1c less than 7 continue current therapy check urine for microalbumin      Relevant Orders   Urine microalbumin-creatinine with uACR   Renal function panel with eGFR     Other   Other hyperlipidemia (Chronic)    Continue with therapy      Relevant Medications   bisoprolol (ZEBETA) 10 MG tablet   tadalafil (CIALIS) 5 MG tablet   Vitamin D insufficiency    Continue vitamin D supplement      Erectile dysfunction    Will give a trial of Cialis low-dose      Meds ordered this encounter  Medications   bisoprolol (ZEBETA) 10 MG tablet    Sig: Take 2 tablets (20 mg total)  by mouth daily.    Dispense:  90 tablet    Refill:  2   tadalafil (CIALIS) 5 MG tablet    Sig: Take 1 tablet (5 mg total) by mouth daily as needed for erectile dysfunction.    Dispense:  10 tablet    Refill:  11   Patient will receive fecal occult kit for colon cancer screening Follow-up: Return in about 2 months (around 09/30/2022) for diabetes, htn, atrial fibrillation.    Asencion Noble, MD

## 2022-08-01 NOTE — Assessment & Plan Note (Signed)
Continue vitamin D supplement

## 2022-08-01 NOTE — Assessment & Plan Note (Signed)
Will give a trial of Cialis low-dose

## 2022-08-01 NOTE — Assessment & Plan Note (Signed)
Blood pressure not yet at goal Plan increase bisoprolol to 20 mg daily continue amlodipine as prescribed Bring patient back short-term follow-up and check renal function labs

## 2022-08-01 NOTE — Assessment & Plan Note (Signed)
A1c less than 7 continue current therapy check urine for microalbumin

## 2022-08-07 ENCOUNTER — Other Ambulatory Visit: Payer: Self-pay | Admitting: Critical Care Medicine

## 2022-08-26 ENCOUNTER — Ambulatory Visit: Payer: 59 | Admitting: Pharmacist

## 2022-09-03 ENCOUNTER — Telehealth: Payer: Self-pay | Admitting: Licensed Clinical Social Worker

## 2022-09-03 NOTE — Patient Outreach (Signed)
  Care Coordination   Initial Visit Note   09/03/2022 Name: Brian Moody. MRN: [id number] DOB: Nov 08, 1965  Brian Moody. is a 57 y.o. year old male who sees Brian Stain, MD for primary care. I spoke with  Brian Moody. by phone today.  What matters to the patients health and wellness today?  Care Coordination    SDOH assessments and interventions completed:  Yes  SDOH Interventions Today    Flowsheet Row Most Recent Value  SDOH Interventions   Food Insecurity Interventions Intervention Not Indicated  Housing Interventions Intervention Not Indicated  Transportation Interventions Intervention Not Indicated        Care Coordination Interventions:  Yes, provided  Interventions Today    Flowsheet Row Most Recent Value  Chronic Disease   Chronic disease during today's visit Hypertension (HTN), Atrial Fibrillation (AFib), Diabetes, Other  [Hx of Stroke]  General Interventions   General Interventions Discussed/Reviewed General Interventions Discussed, Doctor Visits  [LCSW introduced self and explained role in care coordination services. Pt is not interested, at this time]  Doctor Visits Discussed/Reviewed Doctor Visits Discussed  [LCSW discussed recommendations from previous PCP appt. Pt reports med compliance and monitors bp]  Pharmacy Interventions   Pharmacy Dicussed/Reviewed Medication Adherence  [Pt reports med compliance]       Follow up plan: No further intervention required.   Encounter Outcome:  Pt. Visit Completed   Brian Moody, MSW, [email or web address] Phone [phone] 5:49 PM

## 2022-09-03 NOTE — Patient Instructions (Signed)
Visit Information  Thank you for taking time to visit with me today. Please don't hesitate to contact me if I can be of assistance to you.   If you are experiencing a Mental Health or [place] or need someone to talk to, please call the Suicide and Crisis Lifeline: 988 call 911   The patient verbalized understanding of instructions, educational materials, and care plan provided today and DECLINED offer to receive copy of patient instructions, educational materials, and care plan.   No further follow up required: Pt is not interested in care coordination services at this time  Christa See, MSW, [email or web address] Phone [phone] 5:49 PM

## 2022-10-07 NOTE — Progress Notes (Deleted)
Established Patient Office Visit  Subjective:  Patient ID: Brian Moody., male    DOB: [birth year]  Age: 57 y.o. MRN: [id number]  CC:  No chief complaint on file.   HPI 06/2021 Brian Moody. presents for follow-up of hypertension.  Patient became confused as to whether he should be taking his blood pressure medicines daily or not but tickly when his insurance company sent him a mail order supply of prepackaged medications.  He has not been on his bisoprolol or amlodipine for about 2 weeks and on arrival blood pressure is 148/83.  When he has been compliant with his medicines blood pressure has been well controlled.  He currently does not have any symptoms referable to the blood pressure.  He is also still smoking 5 cigarettes daily.  Patient was in the emergency room last week for acute onset of pyelonephritis with left-sided flank pain.  He is now taking a course of Bactrim and has seen improvement in symptoms.  He is trying to hydrate himself more at this time.  Patient did declined the flu vaccine at this visit  Patient is due colon cancer screening and was sent a Cologuard kit but did not process it properly.  We will have him do a fecal occult kit here in the office.   12/7 patient seen in direct follow-up on arrival blood pressure 133/95 on recheck it remains elevated.  Patient has had a prior stroke involving the cerebellum on the right affecting left side with left-sided weakness and dizziness that is persisting.  Patient is having difficulty with ambulation and would like a cane for this.  He had not yet gotten much physical therapy and needs another referral for this.  He has a rash in the left lower extremity and pain in the right knee.  Patient needs better blood pressure management. Patient does have type 2 diabetes on metformin alone with good control Patient is no longer smoking  Below is a copy of the recent discharge Adm 02/2022 for CVA: Date of Admission:  03/23/2022 Date of Discharge: 03/25/2022   Attending Physician:  Stroke, Md, MD, Stroke MD Patient's PCP:  Storm Frisk, MD   DISCHARGE DIAGNOSIS:  Principal Problem:   Stroke:  right MCA stroke s/p TNK and IR with TICI2c, likely due to afib not compliant with eliquis   Active Problems:   hyperlipidemia   Essential hypertension   Tobacco use   Obesity (BMI 30-39.9)   Chronic atrial fibrillation (HCC)   Controlled type 2 diabetes mellitus with hyperglycemia   Non-traumatic subconjunctival hemorrhage, bilateral   Medication List       STOP taking these medications     albuterol 108 (90 Base) MCG/ACT inhaler Commonly known as: VENTOLIN HFA    Moderna COVID-19 Bivalent 50 MCG/0.5ML injection Generic drug: COVID-19 mRNA bivalent vaccine (Moderna)           TAKE these medications     5 Series BP Monitor Devi 1 Units by Does not apply route daily. Measure blood pressure daily    acetaminophen 500 MG tablet Commonly known as: TYLENOL Take 2 tablets (1,000 mg total) by mouth every 6 (six) hours as needed for headache.    amLODipine 10 MG tablet Commonly known as: NORVASC Take 1 tablet (10 mg total) by mouth daily. Start taking on: March 26, 2022    atorvastatin 20 MG tablet Commonly known as: LIPITOR Take 1 tablet (20 mg total) by mouth daily. Start taking on: March 26, 2022 What changed:  medication strength how much to take    bisoprolol 10 MG tablet Commonly known as: ZEBETA Take 1 tablet (10 mg total) by mouth daily. Start taking on: March 26, 2022    blood glucose meter kit and supplies Kit Dispense based on patient and insurance preference. Use up to four times daily as directed. (FOR ICD-9 250.00, 250.01).    cyclobenzaprine 5 MG tablet Commonly known as: FLEXERIL Take 1 tablet (5 mg total) by mouth 3 (three) times daily as needed for muscle spasms.    Eliquis 5 MG Tabs tablet Generic drug: apixaban Take 1 tablet by mouth twice daily. Appt  needed What changed: See the new instructions.    metFORMIN 500 MG tablet Commonly known as: GLUCOPHAGE Take 1 tablet (500 mg total) by mouth daily with breakfast.    polyvinyl alcohol 1.4 % ophthalmic solution Commonly known as: LIQUIFILM TEARS Place 1 drop into both eyes as needed for dry eyes.    Vitamin D (Ergocalciferol) 1.25 MG (50000 UNIT) Caps capsule Commonly known as: DRISDOL Take 1 capsule (50,000 Units total) by mouth every 7 (seven) days.             LABORATORY STUDIES CBC Labs (Brief)          Component Value Date/Time    WBC 7.6 03/25/2022 0259    RBC 4.56 03/25/2022 0259    HGB 13.7 03/25/2022 0259    HGB 16.2 08/14/2020 1519    HCT 40.6 03/25/2022 0259    HCT 49.2 08/14/2020 1519    PLT 185 03/25/2022 0259    PLT 244 08/14/2020 1519    MCV 89.0 03/25/2022 0259    MCV 90 08/14/2020 1519    MCH 30.0 03/25/2022 0259    MCHC 33.7 03/25/2022 0259    RDW 14.5 03/25/2022 0259    RDW 13.5 08/14/2020 1519    LYMPHSABS 2.9 03/23/2022 2315    LYMPHSABS 3.2 (H) 08/14/2020 1519    MONOABS 0.8 03/23/2022 2315    EOSABS 0.1 03/23/2022 2315    EOSABS 0.1 08/14/2020 1519    BASOSABS 0.0 03/23/2022 2315    BASOSABS 0.0 08/14/2020 1519      CMP Labs (Brief)          Component Value Date/Time    NA 140 03/25/2022 0259    NA 138 01/09/2022 1443    K 3.4 (L) 03/25/2022 0259    CL 103 03/25/2022 0259    CO2 26 03/25/2022 0259    GLUCOSE 120 (H) 03/25/2022 0259    BUN 8 03/25/2022 0259    BUN 11 01/09/2022 1443    CREATININE 1.13 03/25/2022 0259    CREATININE 1.15 12/04/2015 1731    CALCIUM 8.5 (L) 03/25/2022 0259    PROT 7.6 03/23/2022 2315    PROT 7.1 01/09/2022 1443    ALBUMIN 3.5 03/23/2022 2315    ALBUMIN 4.1 01/09/2022 1443    AST 23 03/23/2022 2315    ALT 15 03/23/2022 2315    ALKPHOS 75 03/23/2022 2315    BILITOT 0.4 03/23/2022 2315    BILITOT <0.2 01/09/2022 1443    GFRNONAA >60 03/25/2022 0259    GFRNONAA 74 12/04/2015 1731    GFRAA 89  08/14/2020 1519    GFRAA 85 12/04/2015 1731      COAGS Recent Labs       Lab Results  Component Value Date    INR 1.1 03/23/2022    INR 1.3 (H) 09/01/2018  INR 1.02 06/23/2015      Lipid Panel Labs (Brief)          Component Value Date/Time    CHOL 124 03/24/2022 0249    CHOL 185 08/14/2020 1519    TRIG 40 03/24/2022 0249    HDL 34 (L) 03/24/2022 0249    HDL 46 08/14/2020 1519    CHOLHDL 3.6 03/24/2022 0249    VLDL 8 03/24/2022 0249    LDLCALC 82 03/24/2022 0249    LDLCALC 119 (H) 08/14/2020 1519      HgbA1C  Recent Labs       Lab Results  Component Value Date    HGBA1C 5.8 (H) 03/24/2022      Urinalysis Labs (Brief)          Component Value Date/Time    COLORURINE AMBER (A) 04/18/2016 1722    APPEARANCEUR Clear 08/14/2020 1519    LABSPEC >=1.030 06/28/2021 1634    PHURINE 5.5 06/28/2021 1634    GLUCOSEU NEGATIVE 06/28/2021 1634    HGBUR TRACE (A) 06/28/2021 1634    BILIRUBINUR SMALL (A) 06/28/2021 1634    BILIRUBINUR Negative 08/14/2020 1519    KETONESUR TRACE (A) 06/28/2021 1634    PROTEINUR NEGATIVE 06/28/2021 1634    UROBILINOGEN 0.2 06/28/2021 1634    NITRITE NEGATIVE 06/28/2021 1634    LEUKOCYTESUR SMALL (A) 06/28/2021 1634      Urine Drug Screen  Labs (Brief)  No results found for: "LABOPIA", "COCAINSCRNUR", "LABBENZ", "AMPHETMU", "THCU", "LABBARB"    Alcohol Level Labs (Brief)          Component Value Date/Time    ETH <10 03/23/2022 2315              HISTORY OF PRESENT ILLNESS       HOSPITAL COURSE  Mr. Lucan Riner. is a 57 y.o. male with history of atrial fibrillation on Eliquis with patient having not taken this medication for about two weeks presenting with acute onset dizziness and left sided weakness with a fall.  He was given TNK (he had missed multiple doses of his Eliquis) and taken to IR for mechanical thrombectomy of the right MCA.  TICI 2c flow was achieved   Stroke:  right MCA stroke s/p TNK and IR with  TICI2c, likely due to afib not compliant with eliquis Code Stroke CT head No acute abnormality. ASPECTS 10.    CTA head & neck occlusion of distal right M2 IR showed right M3 occlusion x2, status post TICI2c reperfusion MRI R MCA scattered infarcts MRA previously noted occluded distal right M2 now appears patent, and flow is seen in both the superior and inferior M3 branch 2D Echo EF 60 to 65% LDL 82 HgbA1c 5.8 VTE prophylaxis - SCDs Eliquis (apixaban) daily prior to admission (but patient not compliant with medication), now on ASA. Plan to restart eliquis in am Therapy recommendations:  none Disposition:  home   Hypertension Home meds:  amlodipine 10 mg daily, bisoprolol 10 mg daily Stable Long-term BP goal normotensive   Atrial fibrillation Patient has persistent atrial fibrillation Was prescribed Eliquis but had not taken this medication for past two weeks and when he was taken he took only once a day Discussed importance of taking Eliquis as prescribed Now on ASA, will start Eliquis in am   Hyperlipidemia Home meds: Lipitor 10 LDL 82, goal < 70 On atorvastatin 20 mg daily  High intensity statin not indicated as LDL near goal Continue statin at discharge   Tobacco  abuse Current smoker Smoking cessation counseling provided Pt is willing to quit   Other Stroke Risk Factors Obesity, Body mass index is 35.93 kg/m., BMI >/= 30 associated with increased stroke risk, recommend weight loss, diet and exercise as appropriate    Other Active Problems Bilateral eye subconjunctival hemorrhage post TNK       DISCHARGE EXAM Blood pressure (!) 157/113, pulse (!) 109, temperature 98.2 F (36.8 C), temperature source Oral, resp. rate 20, height 5' 10.5" (1.791 m), SpO2 96 %.   08/01/2022 Patient seen in return follow-up on arrival blood pressure is mildly elevated 143/90 patient has no symptoms from this.  He he does have low back pain he is finished a course of physical therapy.  He  has no other real complaints.  He has not been back in the hospital.  He has had no further stroke symptoms.  He is taking his preventive therapy.  Maintains amlodipine 10 mg daily and bisoprolol 10 mg daily he is requesting erectile dysfunction medicine he has just been released from physical therapy  10/08/22 Bp eye colon bmp uab  Past Medical History:  Diagnosis Date   Avulsion fracture of distal fibula 06/02/2013   Right distal fibula occurred 04/30/2013    Dysuria 08/14/2020   Hordeolum externum left upper eyelid 06/29/2019   Hypertension    Persistent atrial fibrillation (HCC)    a. newly diagnosed on 05/2015 admission. started on coumadin.    Pre-diabetes    a. HgA1c 6.3 05/2015   Tobacco abuse     Past Surgical History:  Procedure Laterality Date   IR CT HEAD LTD  03/24/2022   IR PERCUTANEOUS ART THROMBECTOMY/INFUSION INTRACRANIAL INC DIAG ANGIO  03/24/2022   IR US GUIDE VASC ACCESS RIGHT  03/24/2022   RADIOLOGY WITH ANESTHESIA N/A 03/24/2022   Procedure: IR WITH ANESTHESIA;  Surgeon: Radiologist, Medication, MD;  Location: [facility] OR;  Service: Radiology;  Laterality: N/A;    Family History  Problem Relation Age of Onset   Hypertension Father    Diabetes Maternal Grandmother    Hypertension Maternal Grandmother    Diabetes Maternal Grandfather    Hypertension Maternal Grandfather     Social History   Socioeconomic History   Marital status: Married    Spouse name: Not on file   Number of children: Not on file   Years of education: Not on file   Highest education level: Not on file  Occupational History   Not on file  Tobacco Use   Smoking status: Former    Packs/day: .1    Types: Cigarettes    Passive exposure: Never   Smokeless tobacco: Never  Vaping Use   Vaping Use: Never used  Substance and Sexual Activity   Alcohol use: No   Drug use: No   Sexual activity: Not Currently  Other Topics Concern   Not on file  Social History Narrative   Not on file   Social  Determinants of Health   Financial Resource Strain: Low Risk  (05/22/2022)   Overall Financial Resource Strain (CARDIA)    Difficulty of Paying Living Expenses: Not hard at all  Food Insecurity: No Food Insecurity (09/03/2022)   Hunger Vital Sign    Worried About Running Out of Food in the Last Year: Never true    Ran Out of Food in the Last Year: Never true  Transportation Needs: No Transportation Needs (09/03/2022)   PRAPARE - Transportation    Lack of Transportation (Medical): No    Lack  of Transportation (Non-Medical): No  Physical Activity: Inactive (05/22/2022)   Exercise Vital Sign    Days of Exercise per Week: 0 days    Minutes of Exercise per Session: 0 min  Stress: No Stress Concern Present (05/22/2022)   [employer] of Occupational Health - Occupational Stress Questionnaire    Feeling of Stress : Only a little  Social Connections: Not on file  Intimate Partner Violence: Not on file    Outpatient Medications Prior to Visit  Medication Sig Dispense Refill   acetaminophen (TYLENOL) 500 MG tablet Take 2 tablets (1,000 mg total) by mouth every 6 (six) hours as needed for headache. 60 tablet 2   amLODipine (NORVASC) 10 MG tablet Take 1 tablet (10 mg total) by mouth daily. 90 tablet 2   apixaban (ELIQUIS) 5 MG TABS tablet Take 1 tablet (5 mg total) by mouth 2 (two) times daily. 120 tablet 2   atorvastatin (LIPITOR) 20 MG tablet Take 1 tablet (20 mg total) by mouth daily. 90 tablet 2   bisoprolol (ZEBETA) 10 MG tablet Take 2 tablets (20 mg total) by mouth daily. 90 tablet 2   blood glucose meter kit and supplies KIT Dispense based on patient and insurance preference. Use up to four times daily as directed. (FOR ICD-9 250.00, 250.01). 1 each 0   Blood Pressure Monitoring (5 SERIES BP MONITOR) DEVI 1 Units by Does not apply route daily. Measure blood pressure daily 1 each 0   clobetasol cream (TEMOVATE) 0.05 % APPLY TO AFFECTED AREA TWICE A DAY 30 g 0   cyclobenzaprine  (FLEXERIL) 5 MG tablet Take 1 tablet (5 mg total) by mouth 3 (three) times daily as needed for muscle spasms. 60 tablet 2   diclofenac Sodium (VOLTAREN) 1 % GEL Apply 2 g topically 4 (four) times daily. To right knee 100 g 1   metFORMIN (GLUCOPHAGE) 500 MG tablet Take 1 tablet (500 mg total) by mouth daily with breakfast. 90 tablet 1   polyvinyl alcohol (LIQUIFILM TEARS) 1.4 % ophthalmic solution Place 1 drop into both eyes as needed for dry eyes. 15 mL 0   tadalafil (CIALIS) 5 MG tablet Take 1 tablet (5 mg total) by mouth daily as needed for erectile dysfunction. 10 tablet 11   Vitamin D, Ergocalciferol, (DRISDOL) 1.25 MG (50000 UNIT) CAPS capsule Take 1 capsule (50,000 Units total) by mouth every 7 (seven) days. 4 capsule 2   No facility-administered medications prior to visit.    No Known Allergies  ROS Review of Systems  Constitutional: Negative.  Negative for chills, diaphoresis and fever.  HENT: Negative.  Negative for congestion, ear pain, hearing loss, nosebleeds, postnasal drip, rhinorrhea, sinus pressure, sore throat, tinnitus, trouble swallowing and voice change.   Eyes: Negative.  Negative for photophobia and redness.  Respiratory: Negative.  Negative for apnea, cough, choking, chest tightness, shortness of breath, wheezing and stridor.   Cardiovascular: Negative.  Negative for chest pain, palpitations and leg swelling.  Gastrointestinal: Negative.  Negative for abdominal distention, abdominal pain, blood in stool, constipation, diarrhea, nausea and vomiting.  Endocrine: Negative for polydipsia.  Genitourinary: Negative.  Negative for flank pain, frequency, hematuria and urgency.  Musculoskeletal:  Positive for back pain. Negative for arthralgias, myalgias and neck pain.  Skin: Negative.  Negative for rash.  Allergic/Immunologic: Negative.  Negative for environmental allergies and food allergies.  Neurological:  Negative for dizziness, tremors, seizures, syncope and headaches.   Hematological: Negative.  Negative for adenopathy. Does not bruise/bleed easily.  Psychiatric/Behavioral: Negative.  Negative for agitation, sleep disturbance and suicidal ideas. The patient is not nervous/anxious.       Objective:    Physical Exam Vitals reviewed.  Constitutional:      Appearance: Normal appearance. He is well-developed. He is obese. He is not diaphoretic.  HENT:     Head: Normocephalic and atraumatic.     Nose: No nasal deformity, septal deviation, mucosal edema or rhinorrhea.     Right Sinus: No maxillary sinus tenderness or frontal sinus tenderness.     Left Sinus: No maxillary sinus tenderness or frontal sinus tenderness.     Mouth/Throat:     Pharynx: No oropharyngeal exudate.  Eyes:     General: No scleral icterus.    Conjunctiva/sclera: Conjunctivae normal.     Pupils: Pupils are equal, round, and reactive to light.  Neck:     Thyroid: No thyromegaly.     Vascular: No carotid bruit or JVD.     Trachea: Trachea normal. No tracheal tenderness or tracheal deviation.  Cardiovascular:     Rate and Rhythm: Normal rate and regular rhythm.     Chest Wall: PMI is not displaced.     Pulses: Normal pulses. No decreased pulses.     Heart sounds: Normal heart sounds, S1 normal and S2 normal. Heart sounds not distant. No murmur heard.    No systolic murmur is present.     No diastolic murmur is present.     No friction rub. No gallop. No S3 or S4 sounds.  Pulmonary:     Effort: Pulmonary effort is normal. No tachypnea, accessory muscle usage or respiratory distress.     Breath sounds: Normal breath sounds. No stridor. No decreased breath sounds, wheezing, rhonchi or rales.  Chest:     Chest wall: No tenderness.  Abdominal:     General: Bowel sounds are normal. There is no distension.     Palpations: Abdomen is soft. Abdomen is not rigid.     Tenderness: There is no abdominal tenderness. There is right CVA tenderness. There is no left CVA tenderness, guarding or  rebound.     Comments: Right costovertebral angle tenderness is improved from documentation in the emergency room  Musculoskeletal:        General: Normal range of motion.     Cervical back: Normal range of motion and neck supple. No edema, erythema or rigidity. No muscular tenderness. Normal range of motion.     Right lower leg: No edema.     Left lower leg: No edema.  Lymphadenopathy:     Head:     Right side of head: No submental or submandibular adenopathy.     Left side of head: No submental or submandibular adenopathy.     Cervical: No cervical adenopathy.  Skin:    General: Skin is warm and dry.     Coloration: Skin is not pale.     Findings: No rash.     Nails: There is no clubbing.  Neurological:     General: No focal deficit present.     Mental Status: He is alert and oriented to person, place, and time. Mental status is at baseline.     Sensory: No sensory deficit.  Psychiatric:        Mood and Affect: Mood normal.        Speech: Speech normal.        Behavior: Behavior normal.        Thought Content: Thought content normal.   Foot exam  normal  There were no vitals taken for this visit. Wt Readings from Last 3 Encounters:  08/01/22 247 lb 3.2 oz (112.1 kg)  05/30/22 249 lb 12.8 oz (113.3 kg)  05/22/22 250 lb (113.4 kg)     Health Maintenance Due  Topic Date Due   OPHTHALMOLOGY EXAM  Never done   DTaP/Tdap/Td (1 - Tdap) Never done   COLON CANCER SCREENING ANNUAL FOBT  Never done   Zoster Vaccines- Shingrix (1 of 2) Never done    There are no preventive care reminders to display for this patient.  Lab Results  Component Value Date   TSH 2.678 06/22/2015   Lab Results  Component Value Date   WBC 7.3 05/30/2022   HGB 13.9 05/30/2022   HCT 41.9 05/30/2022   MCV 86 05/30/2022   PLT 249 05/30/2022   Lab Results  Component Value Date   NA 141 05/30/2022   K 4.4 05/30/2022   CO2 24 05/30/2022   GLUCOSE 95 05/30/2022   BUN 12 05/30/2022   CREATININE  1.19 05/30/2022   BILITOT 0.5 05/30/2022   ALKPHOS 95 05/30/2022   AST 15 05/30/2022   ALT 13 05/30/2022   PROT 7.4 05/30/2022   ALBUMIN 4.0 05/30/2022   CALCIUM 9.1 05/30/2022   ANIONGAP 11 03/25/2022   EGFR 71 05/30/2022   Lab Results  Component Value Date   CHOL 115 05/30/2022   Lab Results  Component Value Date   HDL 43 05/30/2022   Lab Results  Component Value Date   LDLCALC 59 05/30/2022   Lab Results  Component Value Date   TRIG 55 05/30/2022   Lab Results  Component Value Date   CHOLHDL 2.7 05/30/2022   Lab Results  Component Value Date   HGBA1C 6.7 (H) 05/30/2022    SIGNIFICANT DIAGNOSTIC STUDIES  Imaging Results  MR BRAIN WO CONTRAST   Result Date: 03/24/2022 CLINICAL DATA:  Stroke follow-up, status post thrombectomy EXAM: MRI HEAD WITHOUT CONTRAST MRA HEAD WITHOUT CONTRAST TECHNIQUE: Multiplanar, multi-echo pulse sequences of the brain and surrounding structures were acquired without intravenous contrast. Angiographic images of the Circle of Willis were acquired using MRA technique without intravenous contrast. COMPARISON:  No prior MRI, correlation is made with CT head and CTA head and neck 03/23/2022 FINDINGS: MRI HEAD FINDINGS Brain: Restricted diffusion with ADC correlate in the right insula and posteroinferior frontal lobe (series 5, images 76-85), with additional more scattered foci in the right parietal and occipital lobe (series 5, images 71-88). These areas are associated with mildly increased T2 hyperintense signal. No acute hemorrhage, mass, mass effect, or midline shift. No hydrocephalus or extra-axial collection. Punctate foci of hemosiderin deposition in the posteroinferior right frontal lobe (series 14, image 36 and 44), right temporal lobe (series 14, images 26 and 29), and right occipital lobe (series 14, images 28 and 32), which may represent small microhemorrhages or thrombi. Vascular: Please see MRA findings below. Skull and upper cervical  spine: Normal marrow signal. Sinuses/Orbits: No acute finding. Other: The mastoids are well aerated. MRA HEAD FINDINGS Anterior circulation: Both internal carotid arteries are patent to the termini, without significant stenosis. Patent left A1. Aplastic right A1. Normal anterior communicating artery. Anterior cerebral arteries are patent to their distal aspects. No M1 stenosis or occlusion. Normal MCA bifurcations. Distal MCA branches perfused and symmetric. The previously noted occluded right M2 now appears patent, and flow is seen in both the superior and inferior M3 branching from it. Posterior circulation: Vertebral arteries patent to the  vertebrobasilar junction without stenosis. Diminutive left V4. Basilar patent to its distal aspect. Superior cerebellar arteries patent bilaterally. Patent P1 segments, diminutive on the right. Near fetal origin of the right PCA from a prominent right posterior communicating artery. The left posterior communicating artery is also patent. PCAs perfused to their distal aspects without stenosis. Anatomic variants: Near fetal origin of the right PCA. IMPRESSION: 1. Acute infarcts in the right insula and posteroinferior frontal lobe, with additional more scattered foci in the right parietal and occipital lobe. 2. The previously noted occluded distal right M2 now appears patent, and flow is seen in both the superior and inferior M3 branching from it. 3. No intracranial large vessel occlusion or significant stenosis. These results will be called to the ordering clinician or representative by the Radiologist Assistant, and communication documented in the PACS or [employer]. Electronically Signed   By: Wiliam Ke M.D.   On: 03/24/2022 23:29    MR ANGIO HEAD WO CONTRAST   Result Date: 03/24/2022 CLINICAL DATA:  Stroke follow-up, status post thrombectomy EXAM: MRI HEAD WITHOUT CONTRAST MRA HEAD WITHOUT CONTRAST TECHNIQUE: Multiplanar, multi-echo pulse sequences of the brain  and surrounding structures were acquired without intravenous contrast. Angiographic images of the Circle of Willis were acquired using MRA technique without intravenous contrast. COMPARISON:  No prior MRI, correlation is made with CT head and CTA head and neck 03/23/2022 FINDINGS: MRI HEAD FINDINGS Brain: Restricted diffusion with ADC correlate in the right insula and posteroinferior frontal lobe (series 5, images 76-85), with additional more scattered foci in the right parietal and occipital lobe (series 5, images 71-88). These areas are associated with mildly increased T2 hyperintense signal. No acute hemorrhage, mass, mass effect, or midline shift. No hydrocephalus or extra-axial collection. Punctate foci of hemosiderin deposition in the posteroinferior right frontal lobe (series 14, image 36 and 44), right temporal lobe (series 14, images 26 and 29), and right occipital lobe (series 14, images 28 and 32), which may represent small microhemorrhages or thrombi. Vascular: Please see MRA findings below. Skull and upper cervical spine: Normal marrow signal. Sinuses/Orbits: No acute finding. Other: The mastoids are well aerated. MRA HEAD FINDINGS Anterior circulation: Both internal carotid arteries are patent to the termini, without significant stenosis. Patent left A1. Aplastic right A1. Normal anterior communicating artery. Anterior cerebral arteries are patent to their distal aspects. No M1 stenosis or occlusion. Normal MCA bifurcations. Distal MCA branches perfused and symmetric. The previously noted occluded right M2 now appears patent, and flow is seen in both the superior and inferior M3 branching from it. Posterior circulation: Vertebral arteries patent to the vertebrobasilar junction without stenosis. Diminutive left V4. Basilar patent to its distal aspect. Superior cerebellar arteries patent bilaterally. Patent P1 segments, diminutive on the right. Near fetal origin of the right PCA from a prominent right  posterior communicating artery. The left posterior communicating artery is also patent. PCAs perfused to their distal aspects without stenosis. Anatomic variants: Near fetal origin of the right PCA. IMPRESSION: 1. Acute infarcts in the right insula and posteroinferior frontal lobe, with additional more scattered foci in the right parietal and occipital lobe. 2. The previously noted occluded distal right M2 now appears patent, and flow is seen in both the superior and inferior M3 branching from it. 3. No intracranial large vessel occlusion or significant stenosis. These results will be called to the ordering clinician or representative by the Radiologist Assistant, and communication documented in the PACS or [employer]. Electronically Signed   By:  Wiliam Ke M.D.   On: 03/24/2022 23:29    ECHOCARDIOGRAM COMPLETE   Result Date: 03/24/2022    ECHOCARDIOGRAM REPORT   Patient Name:   Harim Bi. Date of Exam: 03/24/2022 Medical Rec #:  [id number]            Height:       70.5 in Accession #:    [id number]           Weight:       254.0 lb Date of Birth:  February 07, 1966             BSA:          2.323 m Patient Age:    56 years             BP:           147/82 mmHg Patient Gender: M                    HR:           71 bpm. Exam Location:  Inpatient Procedure: 2D Echo, Cardiac Doppler and Color Doppler Indications:    I163.9 Stroke  History:        Patient has prior history of Echocardiogram examinations, most                 recent 05/10/2016. Arrythmias:Atrial Fibrillation; Risk                 Factors:Hypertension and Diabetes. Tobacco abuse (From Hx).  Sonographer:    Celesta Gentile RCS Referring Phys: [id number] MCNEILL P KIRKPATRICK IMPRESSIONS  1. Left ventricular ejection fraction, by estimation, is 60 to 65%. The left ventricle has normal function. The left ventricle has no regional wall motion abnormalities. There is mild concentric left ventricular hypertrophy. Diastolic function indeterminant due to  Afib.  2. Right ventricular systolic function is mildly reduced. The right ventricular size is normal. Tricuspid regurgitation signal is inadequate for assessing PA pressure.  3. Left atrial size was moderately dilated.  4. Right atrial size was mildly dilated.  5. The mitral valve is abnormal. Mild to moderate mitral valve regurgitation.  6. The aortic valve is tricuspid. There is mild thickening of the aortic valve. Aortic valve regurgitation is mild. Aortic valve sclerosis is present, with no evidence of aortic valve stenosis.  7. The inferior vena cava is dilated in size with <50% respiratory variability, suggesting right atrial pressure of 15 mmHg. Comparison(s): Compared to prior TTE report in 2017, there is no significant change. Conclusion(s)/Recommendation(s): No intracardiac source of embolism detected on this transthoracic study. Consider a transesophageal echocardiogram to exclude cardiac source of embolism if clinically indicated. FINDINGS  Left Ventricle: Left ventricular ejection fraction, by estimation, is 60 to 65%. The left ventricle has normal function. The left ventricle has no regional wall motion abnormalities. Definity contrast agent was given IV to delineate the left ventricular  endocardial borders. The left ventricular internal cavity size was normal in size. There is mild concentric left ventricular hypertrophy. Diastolic function indeterminant due to Afib. Right Ventricle: The right ventricular size is normal. Right vetricular wall thickness was not well visualized. Right ventricular systolic function is mildly reduced. Tricuspid regurgitation signal is inadequate for assessing PA pressure. Left Atrium: Left atrial size was moderately dilated. Right Atrium: Right atrial size was mildly dilated. Pericardium: There is no evidence of pericardial effusion. Mitral Valve: The mitral valve is abnormal. There is mild thickening of the mitral valve leaflet(s).  Mild mitral annular calcification.  Mild to moderate mitral valve regurgitation. Tricuspid Valve: The tricuspid valve is normal in structure. Tricuspid valve regurgitation is trivial. Aortic Valve: The aortic valve is tricuspid. There is mild thickening of the aortic valve. Aortic valve regurgitation is mild. Aortic valve sclerosis is present, with no evidence of aortic valve stenosis. Pulmonic Valve: The pulmonic valve was not well visualized. Pulmonic valve regurgitation is trivial. Aorta: The aortic root is normal in size and structure. Venous: The inferior vena cava is dilated in size with less than 50% respiratory variability, suggesting right atrial pressure of 15 mmHg. IAS/Shunts: The atrial septum is grossly normal.  LEFT VENTRICLE PLAX 2D LVIDd:         4.40 cm LVIDs:         2.90 cm LV PW:         1.20 cm LV IVS:        1.10 cm LVOT diam:     1.90 cm LV SV:         48 LV SV Index:   21 LVOT Area:     2.84 cm  RIGHT VENTRICLE TAPSE (M-mode): 1.6 cm LEFT ATRIUM             Index        RIGHT ATRIUM           Index LA diam:        4.70 cm 2.02 cm/m   RA Area:     26.10 cm LA Vol (A2C):   96.6 ml 41.59 ml/m  RA Volume:   91.40 ml  39.35 ml/m LA Vol (A4C):   75.1 ml 32.33 ml/m LA Biplane Vol: 92.9 ml 40.00 ml/m  AORTIC VALVE LVOT Vmax:   93.30 cm/s LVOT Vmean:  58.000 cm/s LVOT VTI:    0.169 m  AORTA Ao Root diam: 2.85 cm MITRAL VALVE MV Area (PHT): 4.80 cm     SHUNTS MV Decel Time: 158 msec     Systemic VTI:  0.17 m MV E velocity: 148.00 cm/s  Systemic Diam: 1.90 cm Laurance Flatten MD Electronically signed by Laurance Flatten MD Signature Date/Time: 03/24/2022/2:58:07 PM    Final     CT ANGIO HEAD NECK W WO CM (CODE STROKE)   Result Date: 03/23/2022 CLINICAL DATA:  Left facial droop, slurred speech EXAM: CT ANGIOGRAPHY HEAD AND NECK TECHNIQUE: Multidetector CT imaging of the head and neck was performed using the standard protocol during bolus administration of intravenous contrast. Multiplanar CT image reconstructions and MIPs were  obtained to evaluate the vascular anatomy. Carotid stenosis measurements (when applicable) are obtained utilizing NASCET criteria, using the distal internal carotid diameter as the denominator. RADIATION DOSE REDUCTION: This exam was performed according to the departmental dose-optimization program which includes automated exposure control, adjustment of the mA and/or kV according to patient size and/or use of iterative reconstruction technique. CONTRAST:  75mL OMNIPAQUE IOHEXOL 350 MG/ML SOLN COMPARISON:  No prior CTA available, correlation is made with CT head 03/23/2022 FINDINGS: CT HEAD FINDINGS For noncontrast findings, please see same day CT head. CTA NECK FINDINGS Aortic arch: Two-vessel arch with a common origin of the brachiocephalic and left common carotid arteries. Imaged portion shows no evidence of aneurysm or dissection. No significant stenosis of the major arch vessel origins. Right carotid system: No evidence of dissection, occlusion, or hemodynamically significant stenosis (greater than 50%). Left carotid system: No evidence of dissection, occlusion, or hemodynamically significant stenosis (greater than 50%). Vertebral arteries: No evidence of  dissection, occlusion, or hemodynamically significant stenosis (greater than 50%). Skeleton: No acute osseous abnormality. Other neck: No acute finding. Upper chest: No focal pulmonary opacity or pleural effusion. Review of the MIP images confirms the above findings CTA HEAD FINDINGS Anterior circulation: Both internal carotid arteries are patent to the termini, without significant stenosis. Patent left A1. Aplastic right A1. Normal anterior communicating artery. Anterior cerebral arteries are patent to their distal aspects. No M1 stenosis or occlusion. A right M2 appears occluded, just proximal to the bifurcation (series 7, image [id number]), with nonopacification of the more inferior M3 branch and opacification of the more superior branch. MCA branches otherwise  perfused and symmetric. Posterior circulation: Vertebral arteries patent to the vertebrobasilar junction without stenosis, although the left V4 is quite diminutive after the PICA takeoff. Posterior inferior cerebellar arteries patent proximally. Basilar patent to its distal aspect. Superior cerebellar arteries patent proximally. Patent P1 segments, diminutive on the right. Near fetal origin of the right PCA from a prominent right posterior communicating artery. The left posterior communicating artery is also patent. PCAs perfused to their distal aspects without stenosis. Venous sinuses: As permitted by contrast timing, patent. Anatomic variants: Near fetal origin of the right PCA. Review of the MIP images confirms the above findings IMPRESSION: 1. Occlusion of a distal right M2, near the bifurcation, with nonopacification of the more inferior M3 branch and opacification of the more superior branch. 2. Otherwise no intracranial hemodynamically significant stenosis. 3.  No hemodynamically significant stenosis in the neck. These findings were discussed by telephone on 03/23/2022 at 11:46 pm with provider MCNEILL KIRKPATRICK . Electronically Signed   By: Wiliam Ke M.D.   On: 03/23/2022 23:55    CT HEAD CODE STROKE WO CONTRAST   Result Date: 03/23/2022 CLINICAL DATA:  Code stroke. Left-sided weakness, facial droop, slurred speech EXAM: CT HEAD WITHOUT CONTRAST TECHNIQUE: Contiguous axial images were obtained from the base of the skull through the vertex without intravenous contrast. RADIATION DOSE REDUCTION: This exam was performed according to the departmental dose-optimization program which includes automated exposure control, adjustment of the mA and/or kV according to patient size and/or use of iterative reconstruction technique. COMPARISON:  None Available. FINDINGS: Brain: No evidence of acute infarction, hemorrhage, cerebral edema, mass, mass effect, or midline shift. No hydrocephalus or extra-axial  collection. Vascular: No hyperdense vessel. Skull: Negative for fracture or focal lesion. Sinuses/Orbits: No acute finding. Other: The mastoid air cells are well aerated. ASPECTS [facility] Stroke Program Early CT Score) - Ganglionic level infarction (caudate, lentiform nuclei, internal capsule, insula, M1-M3 cortex): 7 - Supraganglionic infarction (M4-M6 cortex): 3 Total score (0-10 with 10 being normal): 10 IMPRESSION: No evidence of acute intracranial process. ASPECTS is 10. Code stroke imaging results were communicated on 03/23/2022 at 11:33 pm to provider Dr. Amada Jupiter via secure text paging. Electronically Signed   By: Wiliam Ke M.D.   On: 03/23/2022 23:33       Assessment & Plan:   Problem List Items Addressed This Visit   None No orders of the defined types were placed in this encounter.  Patient will receive fecal occult kit for colon cancer screening Follow-up: No follow-ups on file.    Shan Levans, MD

## 2022-10-08 ENCOUNTER — Ambulatory Visit: Payer: 59 | Admitting: Critical Care Medicine

## 2022-11-01 ENCOUNTER — Other Ambulatory Visit: Payer: Self-pay | Admitting: Critical Care Medicine

## 2022-11-01 DIAGNOSIS — I482 Chronic atrial fibrillation, unspecified: Secondary | ICD-10-CM

## 2022-11-18 ENCOUNTER — Other Ambulatory Visit: Payer: Self-pay | Admitting: Critical Care Medicine

## 2022-12-04 ENCOUNTER — Other Ambulatory Visit: Payer: Self-pay | Admitting: Pharmacist

## 2022-12-04 ENCOUNTER — Ambulatory Visit: Payer: 59 | Attending: Critical Care Medicine | Admitting: Pharmacist

## 2022-12-04 DIAGNOSIS — Z7984 Long term (current) use of oral hypoglycemic drugs: Secondary | ICD-10-CM

## 2022-12-04 DIAGNOSIS — E1165 Type 2 diabetes mellitus with hyperglycemia: Secondary | ICD-10-CM

## 2022-12-04 DIAGNOSIS — I482 Chronic atrial fibrillation, unspecified: Secondary | ICD-10-CM

## 2022-12-04 MED ORDER — ATORVASTATIN CALCIUM 20 MG PO TABS: 20.0000 mg | ORAL_TABLET | Freq: Every day | ORAL | 2 refills | Status: DC

## 2022-12-04 MED ORDER — AMLODIPINE BESYLATE 10 MG PO TABS: 10.0000 mg | ORAL_TABLET | Freq: Every day | ORAL | 2 refills | Status: DC

## 2022-12-04 MED ORDER — APIXABAN 5 MG PO TABS
5.0000 mg | ORAL_TABLET | Freq: Two times a day (BID) | ORAL | 2 refills | Status: DC
Start: 2022-12-04 — End: 2023-01-31

## 2022-12-04 MED ORDER — METFORMIN HCL 500 MG PO TABS
500.0000 mg | ORAL_TABLET | Freq: Every day | ORAL | 1 refills | Status: DC
Start: 2022-12-04 — End: 2023-01-31

## 2022-12-04 MED ORDER — BISOPROLOL FUMARATE 10 MG PO TABS: 20.0000 mg | ORAL_TABLET | Freq: Every day | ORAL | 2 refills | Status: DC

## 2022-12-04 NOTE — Progress Notes (Signed)
12/04/2022 Name: Brian Moody. MRN: [id number] DOB: 27-Jul-1965  No chief complaint on file.   Brian Moody. is a 57 y.o. year old male who presented for a telephone visit.   They were referred to the pharmacist by a quality report for assistance in managing medication access. He failed the MAD (currently has only metformin) measure per [facility] last year.  Patient is participating in a Managed Medicaid Plan: no  Subjective:  Care Team: Primary Care Provider: Storm Frisk, MD ; Next Scheduled Visit: 01/22/2023.  Medication Access/Adherence  Current Pharmacy:  [id number] - [place], [place] - [address] AT [facility] [address] [place] [address] Phone: [phone] Fax: [phone]  Patient reports affordability concerns with their medications: No  Patient reports access/transportation concerns to their pharmacy: No  Patient reports adherence concerns with their medications:  No  - [facility]. He does not want to use mailorder pharmacy at this time.    Medication Management:  Current adherence strategy: sufficient  Patient reports Good adherence to medications  Patient reports the following barriers to adherence: none  Recent fill dates:  -Metformin: 08/24/2022 for 90-day supplies. Now appears adherent.   Objective:  Lab Results  Component Value Date   HGBA1C 6.7 (H) 05/30/2022    Lab Results  Component Value Date   CREATININE 1.19 05/30/2022   BUN 12 05/30/2022   NA 141 05/30/2022   K 4.4 05/30/2022   CL 103 05/30/2022   CO2 24 05/30/2022    Lab Results  Component Value Date   CHOL 115 05/30/2022   HDL 43 05/30/2022   LDLCALC 59 05/30/2022   TRIG 55 05/30/2022   CHOLHDL 2.7 05/30/2022    Medications Reviewed Today     Reviewed by Storm Frisk, MD (Physician) on 08/01/22 at 1042  Med List Status: <None>   Medication Order Taking? Sig  Documenting Provider Last Dose Status Informant  acetaminophen (TYLENOL) 500 MG tablet [id number]  Take 2 tablets (1,000 mg total) by mouth every 6 (six) hours as needed for headache. Marcine Matar, MD  Active Self  amLODipine (NORVASC) 10 MG tablet [id number] Yes Take 1 tablet (10 mg total) by mouth daily. Storm Frisk, MD Taking Active   apixaban (ELIQUIS) 5 MG TABS tablet [id number]  Take 1 tablet (5 mg total) by mouth 2 (two) times daily. Storm Frisk, MD  Active   atorvastatin (LIPITOR) 20 MG tablet [id number] Yes Take 1 tablet (20 mg total) by mouth daily. Storm Frisk, MD Taking Active   bisoprolol (ZEBETA) 10 MG tablet [id number] Yes Take 1 tablet (10 mg total) by mouth daily. Storm Frisk, MD Taking Active   blood glucose meter kit and supplies KIT [id number]  Dispense based on patient and insurance preference. Use up to four times daily as directed. (FOR ICD-9 250.00, 250.01). Storm Frisk, MD  Active Self  Blood Pressure Monitoring (5 SERIES BP MONITOR) DEVI [id number]  1 Units by Does not apply route daily. Measure blood pressure daily Storm Frisk, MD  Active Self    Discontinued 08/14/20 1506   clobetasol cream (TEMOVATE) 0.05 % [id number] Yes APPLY TO AFFECTED AREA TWICE A DAY Storm Frisk, MD Taking Active   cyclobenzaprine (FLEXERIL) 5 MG tablet [id number] Yes Take 1 tablet (5 mg total) by mouth 3 (three) times daily as needed for muscle spasms. Storm Frisk, MD  Taking Active   diclofenac Sodium (VOLTAREN) 1 % GEL [id number]  Apply 2 g topically 4 (four) times daily. To right knee Storm Frisk, MD  Active   metFORMIN (GLUCOPHAGE) 500 MG tablet [id number]  Take 1 tablet (500 mg total) by mouth daily with breakfast. Storm Frisk, MD  Active   polyvinyl alcohol (LIQUIFILM TEARS) 1.4 % ophthalmic solution [id number] Yes Place 1 drop into both eyes as needed for dry eyes. Mathews Argyle, NP Taking Active   Vitamin D, Ergocalciferol, (DRISDOL) 1.25  MG (50000 UNIT) CAPS capsule [id number]  Take 1 capsule (50,000 Units total) by mouth every 7 (seven) days. Storm Frisk, MD  Active               Assessment/Plan:   Medication Management: - Currently strategy sufficient to maintain appropriate adherence to prescribed medication regimen - Suggested use of weekly pill box to organize medications - Created list of medication, indication, and administration time. Provided to patient   Follow Up Plan: PCP 01/22/2023  Butch Penny, PharmD, BCACP, CPP Clinical Pharmacist [facility] [phone]

## 2022-12-05 ENCOUNTER — Telehealth: Payer: Self-pay | Admitting: Critical Care Medicine

## 2022-12-05 DIAGNOSIS — E1165 Type 2 diabetes mellitus with hyperglycemia: Secondary | ICD-10-CM

## 2022-12-05 NOTE — Telephone Encounter (Signed)
-----   Message from Drucilla Chalet, RPH-CPP sent at 12/04/2022  3:45 PM EDT ----- Brian Moody Dr. Delford Field,   Patient is requesting a new Ophthalmology referral. Used to follow Dr. Dione Booze. Would you be able to submit a referral for him now or do we need to wait until pt is seen later in July?

## 2022-12-05 NOTE — Telephone Encounter (Signed)
New referral made to Dr Dione Booze

## 2022-12-09 ENCOUNTER — Encounter: Payer: Self-pay | Admitting: *Deleted

## 2022-12-09 NOTE — Progress Notes (Signed)
[facility] Quality Team Note  Name: Brian Moody. Date of Birth: 17-Jun-1966 MRN: [id number] Date: 12/09/2022  [facility] Quality Team has reviewed this patient's chart, please see recommendations below:  [facility] Quality Other; Pt has open gaps for AWV (due Nov 2024), Colon screening, EED (referral placed 12/05/22), and A1C (has fu 01/22/23) .    Would provider be able to order A1C and address COL gap at 01/22/23 ov?

## 2023-01-22 ENCOUNTER — Ambulatory Visit: Payer: 59 | Admitting: Critical Care Medicine

## 2023-01-26 NOTE — Progress Notes (Deleted)
Established Patient Office Visit  Subjective:  Patient ID: Brian Sale., male    DOB: [birth year]  Age: 57 y.o. MRN: [id number]  CC:  No chief complaint on file.   HPI 06/2021 Brian Sale. presents for follow-up of hypertension.  Patient became confused as to whether he should be taking his blood pressure medicines daily or not but tickly when his insurance company sent him a mail order supply of prepackaged medications.  He has not been on his bisoprolol or amlodipine for about 2 weeks and on arrival blood pressure is 148/83.  When he has been compliant with his medicines blood pressure has been well controlled.  He currently does not have any symptoms referable to the blood pressure.  He is also still smoking 5 cigarettes daily.  Patient was in the emergency room last week for acute onset of pyelonephritis with left-sided flank pain.  He is now taking a course of Bactrim and has seen improvement in symptoms.  He is trying to hydrate himself more at this time.  Patient did declined the flu vaccine at this visit  Patient is due colon cancer screening and was sent a Cologuard kit but did not process it properly.  We will have him do a fecal occult kit here in the office.   12/7 patient seen in direct follow-up on arrival blood pressure 133/95 on recheck it remains elevated.  Patient has had a prior stroke involving the cerebellum on the right affecting left side with left-sided weakness and dizziness that is persisting.  Patient is having difficulty with ambulation and would like a cane for this.  He had not yet gotten much physical therapy and needs another referral for this.  He has a rash in the left lower extremity and pain in the right knee.  Patient needs better blood pressure management. Patient does have type 2 diabetes on metformin alone with good control Patient is no longer smoking  Below is a copy of the recent discharge Adm 02/2022 for CVA: Date of Admission:  03/23/2022 Date of Discharge: 03/25/2022   Attending Physician:  Stroke, Md, MD, Stroke MD Patient's PCP:  Storm Frisk, MD   DISCHARGE DIAGNOSIS:  Principal Problem:   Stroke:  right MCA stroke s/p TNK and IR with TICI2c, likely due to afib not compliant with eliquis   Active Problems:   hyperlipidemia   Essential hypertension   Tobacco use   Obesity (BMI 30-39.9)   Chronic atrial fibrillation (HCC)   Controlled type 2 diabetes mellitus with hyperglycemia   Non-traumatic subconjunctival hemorrhage, bilateral   Medication List       STOP taking these medications     albuterol 108 (90 Base) MCG/ACT inhaler Commonly known as: VENTOLIN HFA    Moderna COVID-19 Bivalent 50 MCG/0.5ML injection Generic drug: COVID-19 mRNA bivalent vaccine (Moderna)           TAKE these medications     5 Series BP Monitor Devi 1 Units by Does not apply route daily. Measure blood pressure daily    acetaminophen 500 MG tablet Commonly known as: TYLENOL Take 2 tablets (1,000 mg total) by mouth every 6 (six) hours as needed for headache.    amLODipine 10 MG tablet Commonly known as: NORVASC Take 1 tablet (10 mg total) by mouth daily. Start taking on: March 26, 2022    atorvastatin 20 MG tablet Commonly known as: LIPITOR Take 1 tablet (20 mg total) by mouth daily. Start taking on: March 26, 2022 What changed:  medication strength how much to take    bisoprolol 10 MG tablet Commonly known as: ZEBETA Take 1 tablet (10 mg total) by mouth daily. Start taking on: March 26, 2022    blood glucose meter kit and supplies Kit Dispense based on patient and insurance preference. Use up to four times daily as directed. (FOR ICD-9 250.00, 250.01).    cyclobenzaprine 5 MG tablet Commonly known as: FLEXERIL Take 1 tablet (5 mg total) by mouth 3 (three) times daily as needed for muscle spasms.    Eliquis 5 MG Tabs tablet Generic drug: apixaban Take 1 tablet by mouth twice daily. Appt  needed What changed: See the new instructions.    metFORMIN 500 MG tablet Commonly known as: GLUCOPHAGE Take 1 tablet (500 mg total) by mouth daily with breakfast.    polyvinyl alcohol 1.4 % ophthalmic solution Commonly known as: LIQUIFILM TEARS Place 1 drop into both eyes as needed for dry eyes.    Vitamin D (Ergocalciferol) 1.25 MG (50000 UNIT) Caps capsule Commonly known as: DRISDOL Take 1 capsule (50,000 Units total) by mouth every 7 (seven) days.             LABORATORY STUDIES CBC Labs (Brief)          Component Value Date/Time    WBC 7.6 03/25/2022 0259    RBC 4.56 03/25/2022 0259    HGB 13.7 03/25/2022 0259    HGB 16.2 08/14/2020 1519    HCT 40.6 03/25/2022 0259    HCT 49.2 08/14/2020 1519    PLT 185 03/25/2022 0259    PLT 244 08/14/2020 1519    MCV 89.0 03/25/2022 0259    MCV 90 08/14/2020 1519    MCH 30.0 03/25/2022 0259    MCHC 33.7 03/25/2022 0259    RDW 14.5 03/25/2022 0259    RDW 13.5 08/14/2020 1519    LYMPHSABS 2.9 03/23/2022 2315    LYMPHSABS 3.2 (H) 08/14/2020 1519    MONOABS 0.8 03/23/2022 2315    EOSABS 0.1 03/23/2022 2315    EOSABS 0.1 08/14/2020 1519    BASOSABS 0.0 03/23/2022 2315    BASOSABS 0.0 08/14/2020 1519      CMP Labs (Brief)          Component Value Date/Time    NA 140 03/25/2022 0259    NA 138 01/09/2022 1443    K 3.4 (L) 03/25/2022 0259    CL 103 03/25/2022 0259    CO2 26 03/25/2022 0259    GLUCOSE 120 (H) 03/25/2022 0259    BUN 8 03/25/2022 0259    BUN 11 01/09/2022 1443    CREATININE 1.13 03/25/2022 0259    CREATININE 1.15 12/04/2015 1731    CALCIUM 8.5 (L) 03/25/2022 0259    PROT 7.6 03/23/2022 2315    PROT 7.1 01/09/2022 1443    ALBUMIN 3.5 03/23/2022 2315    ALBUMIN 4.1 01/09/2022 1443    AST 23 03/23/2022 2315    ALT 15 03/23/2022 2315    ALKPHOS 75 03/23/2022 2315    BILITOT 0.4 03/23/2022 2315    BILITOT <0.2 01/09/2022 1443    GFRNONAA >60 03/25/2022 0259    GFRNONAA 74 12/04/2015 1731    GFRAA 89  08/14/2020 1519    GFRAA 85 12/04/2015 1731      COAGS Recent Labs       Lab Results  Component Value Date    INR 1.1 03/23/2022    INR 1.3 (H) 09/01/2018  INR 1.02 06/23/2015      Lipid Panel Labs (Brief)          Component Value Date/Time    CHOL 124 03/24/2022 0249    CHOL 185 08/14/2020 1519    TRIG 40 03/24/2022 0249    HDL 34 (L) 03/24/2022 0249    HDL 46 08/14/2020 1519    CHOLHDL 3.6 03/24/2022 0249    VLDL 8 03/24/2022 0249    LDLCALC 82 03/24/2022 0249    LDLCALC 119 (H) 08/14/2020 1519      HgbA1C  Recent Labs       Lab Results  Component Value Date    HGBA1C 5.8 (H) 03/24/2022      Urinalysis Labs (Brief)          Component Value Date/Time    COLORURINE AMBER (A) 04/18/2016 1722    APPEARANCEUR Clear 08/14/2020 1519    LABSPEC >=1.030 06/28/2021 1634    PHURINE 5.5 06/28/2021 1634    GLUCOSEU NEGATIVE 06/28/2021 1634    HGBUR TRACE (A) 06/28/2021 1634    BILIRUBINUR SMALL (A) 06/28/2021 1634    BILIRUBINUR Negative 08/14/2020 1519    KETONESUR TRACE (A) 06/28/2021 1634    PROTEINUR NEGATIVE 06/28/2021 1634    UROBILINOGEN 0.2 06/28/2021 1634    NITRITE NEGATIVE 06/28/2021 1634    LEUKOCYTESUR SMALL (A) 06/28/2021 1634      Urine Drug Screen  Labs (Brief)  No results found for: "LABOPIA", "COCAINSCRNUR", "LABBENZ", "AMPHETMU", "THCU", "LABBARB"    Alcohol Level Labs (Brief)          Component Value Date/Time    ETH <10 03/23/2022 2315              HISTORY OF PRESENT ILLNESS       HOSPITAL COURSE  [name]. is a 57 y.o. male with history of atrial fibrillation on Eliquis with patient having not taken this medication for about two weeks presenting with acute onset dizziness and left sided weakness with a fall.  He was given TNK (he had missed multiple doses of his Eliquis) and taken to IR for mechanical thrombectomy of the right MCA.  TICI 2c flow was achieved   Stroke:  right MCA stroke s/p TNK and IR with  TICI2c, likely due to afib not compliant with eliquis Code Stroke CT head No acute abnormality. ASPECTS 10.    CTA head & neck occlusion of distal right M2 IR showed right M3 occlusion x2, status post TICI2c reperfusion MRI R MCA scattered infarcts MRA previously noted occluded distal right M2 now appears patent, and flow is seen in both the superior and inferior M3 branch 2D Echo EF 60 to 65% LDL 82 HgbA1c 5.8 VTE prophylaxis - SCDs Eliquis (apixaban) daily prior to admission (but patient not compliant with medication), now on ASA. Plan to restart eliquis in am Therapy recommendations:  none Disposition:  home   Hypertension Home meds:  amlodipine 10 mg daily, bisoprolol 10 mg daily Stable Long-term BP goal normotensive   Atrial fibrillation Patient has persistent atrial fibrillation Was prescribed Eliquis but had not taken this medication for past two weeks and when he was taken he took only once a day Discussed importance of taking Eliquis as prescribed Now on ASA, will start Eliquis in am   Hyperlipidemia Home meds: Lipitor 10 LDL 82, goal < 70 On atorvastatin 20 mg daily  High intensity statin not indicated as LDL near goal Continue statin at discharge   Tobacco  abuse Current smoker Smoking cessation counseling provided Pt is willing to quit   Other Stroke Risk Factors Obesity, Body mass index is 35.93 kg/m., BMI >/= 30 associated with increased stroke risk, recommend weight loss, diet and exercise as appropriate    Other Active Problems Bilateral eye subconjunctival hemorrhage post TNK       DISCHARGE EXAM Blood pressure (!) 157/113, pulse (!) 109, temperature 98.2 F (36.8 C), temperature source Oral, resp. rate 20, height 5' 10.5" (1.791 m), SpO2 96 %.   08/01/2022 Patient seen in return follow-up on arrival blood pressure is mildly elevated 143/90 patient has no symptoms from this.  He he does have low back pain he is finished a course of physical therapy.  He  has no other real complaints.  He has not been back in the hospital.  He has had no further stroke symptoms.  He is taking his preventive therapy.  Maintains amlodipine 10 mg daily and bisoprolol 10 mg daily he is requesting erectile dysfunction medicine he has just been released from physical therapy  8/6 Past Medical History:  Diagnosis Date   Avulsion fracture of distal fibula 06/02/2013   Right distal fibula occurred 04/30/2013    Dysuria 08/14/2020   Hordeolum externum left upper eyelid 06/29/2019   Hypertension    Persistent atrial fibrillation (HCC)    a. newly diagnosed on 05/2015 admission. started on coumadin.    Pre-diabetes    a. HgA1c 6.3 05/2015   Tobacco abuse     Past Surgical History:  Procedure Laterality Date   IR CT HEAD LTD  03/24/2022   IR PERCUTANEOUS ART THROMBECTOMY/INFUSION INTRACRANIAL INC DIAG ANGIO  03/24/2022   IR US GUIDE VASC ACCESS RIGHT  03/24/2022   RADIOLOGY WITH ANESTHESIA N/A 03/24/2022   Procedure: IR WITH ANESTHESIA;  Surgeon: Radiologist, Medication, MD;  Location: [facility] OR;  Service: Radiology;  Laterality: N/A;    Family History  Problem Relation Age of Onset   Hypertension Father    Diabetes Maternal Grandmother    Hypertension Maternal Grandmother    Diabetes Maternal Grandfather    Hypertension Maternal Grandfather     Social History   Socioeconomic History   Marital status: Married    Spouse name: Not on file   Number of children: Not on file   Years of education: Not on file   Highest education level: Not on file  Occupational History   Not on file  Tobacco Use   Smoking status: Former    Current packs/day: 0.10    Types: Cigarettes    Passive exposure: Never   Smokeless tobacco: Never  Vaping Use   Vaping status: Never Used  Substance and Sexual Activity   Alcohol use: No   Drug use: No   Sexual activity: Not Currently  Other Topics Concern   Not on file  Social History Narrative   Not on file   Social Determinants  of Health   Financial Resource Strain: Low Risk  (05/22/2022)   Overall Financial Resource Strain (CARDIA)    Difficulty of Paying Living Expenses: Not hard at all  Food Insecurity: No Food Insecurity (09/03/2022)   Hunger Vital Sign    Worried About Running Out of Food in the Last Year: Never true    Ran Out of Food in the Last Year: Never true  Transportation Needs: No Transportation Needs (09/03/2022)   PRAPARE - Administrator, Civil Service (Medical): No    Lack of Transportation (Non-Medical): No  Physical Activity: Inactive (05/22/2022)   Exercise Vital Sign    Days of Exercise per Week: 0 days    Minutes of Exercise per Session: 0 min  Stress: No Stress Concern Present (05/22/2022)   [employer] of Occupational Health - Occupational Stress Questionnaire    Feeling of Stress : Only a little  Social Connections: Not on file  Intimate Partner Violence: Not on file    Outpatient Medications Prior to Visit  Medication Sig Dispense Refill   acetaminophen (TYLENOL) 500 MG tablet Take 2 tablets (1,000 mg total) by mouth every 6 (six) hours as needed for headache. 60 tablet 2   amLODipine (NORVASC) 10 MG tablet Take 1 tablet (10 mg total) by mouth daily. 90 tablet 2   apixaban (ELIQUIS) 5 MG TABS tablet Take 1 tablet (5 mg total) by mouth 2 (two) times daily. 180 tablet 2   atorvastatin (LIPITOR) 20 MG tablet Take 1 tablet (20 mg total) by mouth daily. 90 tablet 2   bisoprolol (ZEBETA) 10 MG tablet Take 2 tablets (20 mg total) by mouth daily. 90 tablet 2   blood glucose meter kit and supplies KIT Dispense based on patient and insurance preference. Use up to four times daily as directed. (FOR ICD-9 250.00, 250.01). 1 each 0   Blood Pressure Monitoring (5 SERIES BP MONITOR) DEVI 1 Units by Does not apply route daily. Measure blood pressure daily 1 each 0   clobetasol cream (TEMOVATE) 0.05 % APPLY TO AFFECTED AREA TWICE A DAY 30 g 0   cyclobenzaprine (FLEXERIL) 5 MG  tablet Take 1 tablet (5 mg total) by mouth 3 (three) times daily as needed for muscle spasms. 60 tablet 2   diclofenac Sodium (VOLTAREN) 1 % GEL Apply 2 g topically 4 (four) times daily. To right knee 100 g 1   metFORMIN (GLUCOPHAGE) 500 MG tablet Take 1 tablet (500 mg total) by mouth daily with breakfast. 90 tablet 1   polyvinyl alcohol (LIQUIFILM TEARS) 1.4 % ophthalmic solution Place 1 drop into both eyes as needed for dry eyes. 15 mL 0   tadalafil (CIALIS) 5 MG tablet Take 1 tablet (5 mg total) by mouth daily as needed for erectile dysfunction. 10 tablet 11   Vitamin D, Ergocalciferol, (DRISDOL) 1.25 MG (50000 UNIT) CAPS capsule Take 1 capsule (50,000 Units total) by mouth every 7 (seven) days. 4 capsule 2   No facility-administered medications prior to visit.    No Known Allergies  ROS Review of Systems  Constitutional: Negative.  Negative for chills, diaphoresis and fever.  HENT: Negative.  Negative for congestion, ear pain, hearing loss, nosebleeds, postnasal drip, rhinorrhea, sinus pressure, sore throat, tinnitus, trouble swallowing and voice change.   Eyes: Negative.  Negative for photophobia and redness.  Respiratory: Negative.  Negative for apnea, cough, choking, chest tightness, shortness of breath, wheezing and stridor.   Cardiovascular: Negative.  Negative for chest pain, palpitations and leg swelling.  Gastrointestinal: Negative.  Negative for abdominal distention, abdominal pain, blood in stool, constipation, diarrhea, nausea and vomiting.  Endocrine: Negative for polydipsia.  Genitourinary: Negative.  Negative for flank pain, frequency, hematuria and urgency.  Musculoskeletal:  Positive for back pain. Negative for arthralgias, myalgias and neck pain.  Skin: Negative.  Negative for rash.  Allergic/Immunologic: Negative.  Negative for environmental allergies and food allergies.  Neurological:  Negative for dizziness, tremors, seizures, syncope and headaches.  Hematological:  Negative.  Negative for adenopathy. Does not bruise/bleed easily.  Psychiatric/Behavioral: Negative.  Negative for agitation, sleep  disturbance and suicidal ideas. The patient is not nervous/anxious.       Objective:    Physical Exam Vitals reviewed.  Constitutional:      Appearance: Normal appearance. He is well-developed. He is obese. He is not diaphoretic.  HENT:     Head: Normocephalic and atraumatic.     Nose: No nasal deformity, septal deviation, mucosal edema or rhinorrhea.     Right Sinus: No maxillary sinus tenderness or frontal sinus tenderness.     Left Sinus: No maxillary sinus tenderness or frontal sinus tenderness.     Mouth/Throat:     Pharynx: No oropharyngeal exudate.  Eyes:     General: No scleral icterus.    Conjunctiva/sclera: Conjunctivae normal.     Pupils: Pupils are equal, round, and reactive to light.  Neck:     Thyroid: No thyromegaly.     Vascular: No carotid bruit or JVD.     Trachea: Trachea normal. No tracheal tenderness or tracheal deviation.  Cardiovascular:     Rate and Rhythm: Normal rate and regular rhythm.     Chest Wall: PMI is not displaced.     Pulses: Normal pulses. No decreased pulses.     Heart sounds: Normal heart sounds, S1 normal and S2 normal. Heart sounds not distant. No murmur heard.    No systolic murmur is present.     No diastolic murmur is present.     No friction rub. No gallop. No S3 or S4 sounds.  Pulmonary:     Effort: Pulmonary effort is normal. No tachypnea, accessory muscle usage or respiratory distress.     Breath sounds: Normal breath sounds. No stridor. No decreased breath sounds, wheezing, rhonchi or rales.  Chest:     Chest wall: No tenderness.  Abdominal:     General: Bowel sounds are normal. There is no distension.     Palpations: Abdomen is soft. Abdomen is not rigid.     Tenderness: There is no abdominal tenderness. There is right CVA tenderness. There is no left CVA tenderness, guarding or rebound.      Comments: Right costovertebral angle tenderness is improved from documentation in the emergency room  Musculoskeletal:        General: Normal range of motion.     Cervical back: Normal range of motion and neck supple. No edema, erythema or rigidity. No muscular tenderness. Normal range of motion.     Right lower leg: No edema.     Left lower leg: No edema.  Lymphadenopathy:     Head:     Right side of head: No submental or submandibular adenopathy.     Left side of head: No submental or submandibular adenopathy.     Cervical: No cervical adenopathy.  Skin:    General: Skin is warm and dry.     Coloration: Skin is not pale.     Findings: No rash.     Nails: There is no clubbing.  Neurological:     General: No focal deficit present.     Mental Status: He is alert and oriented to person, place, and time. Mental status is at baseline.     Sensory: No sensory deficit.  Psychiatric:        Mood and Affect: Mood normal.        Speech: Speech normal.        Behavior: Behavior normal.        Thought Content: Thought content normal.   Foot exam normal  There were  no vitals taken for this visit. Wt Readings from Last 3 Encounters:  08/01/22 247 lb 3.2 oz (112.1 kg)  05/30/22 249 lb 12.8 oz (113.3 kg)  05/22/22 250 lb (113.4 kg)     Health Maintenance Due  Topic Date Due   OPHTHALMOLOGY EXAM  Never done   DTaP/Tdap/Td (1 - Tdap) Never done   COLON CANCER SCREENING ANNUAL FOBT  Never done   Zoster Vaccines- Shingrix (1 of 2) Never done   HEMOGLOBIN A1C  11/29/2022   Diabetic kidney evaluation - Urine ACR  01/10/2023   INFLUENZA VACCINE  01/23/2023    There are no preventive care reminders to display for this patient.  Lab Results  Component Value Date   TSH 2.678 06/22/2015   Lab Results  Component Value Date   WBC 7.3 05/30/2022   HGB 13.9 05/30/2022   HCT 41.9 05/30/2022   MCV 86 05/30/2022   PLT 249 05/30/2022   Lab Results  Component Value Date   NA 141  05/30/2022   K 4.4 05/30/2022   CO2 24 05/30/2022   GLUCOSE 95 05/30/2022   BUN 12 05/30/2022   CREATININE 1.19 05/30/2022   BILITOT 0.5 05/30/2022   ALKPHOS 95 05/30/2022   AST 15 05/30/2022   ALT 13 05/30/2022   PROT 7.4 05/30/2022   ALBUMIN 4.0 05/30/2022   CALCIUM 9.1 05/30/2022   ANIONGAP 11 03/25/2022   EGFR 71 05/30/2022   Lab Results  Component Value Date   CHOL 115 05/30/2022   Lab Results  Component Value Date   HDL 43 05/30/2022   Lab Results  Component Value Date   LDLCALC 59 05/30/2022   Lab Results  Component Value Date   TRIG 55 05/30/2022   Lab Results  Component Value Date   CHOLHDL 2.7 05/30/2022   Lab Results  Component Value Date   HGBA1C 6.7 (H) 05/30/2022    SIGNIFICANT DIAGNOSTIC STUDIES  Imaging Results  MR BRAIN WO CONTRAST   Result Date: 03/24/2022 CLINICAL DATA:  Stroke follow-up, status post thrombectomy EXAM: MRI HEAD WITHOUT CONTRAST MRA HEAD WITHOUT CONTRAST TECHNIQUE: Multiplanar, multi-echo pulse sequences of the brain and surrounding structures were acquired without intravenous contrast. Angiographic images of the Circle of Willis were acquired using MRA technique without intravenous contrast. COMPARISON:  No prior MRI, correlation is made with CT head and CTA head and neck 03/23/2022 FINDINGS: MRI HEAD FINDINGS Brain: Restricted diffusion with ADC correlate in the right insula and posteroinferior frontal lobe (series 5, images 76-85), with additional more scattered foci in the right parietal and occipital lobe (series 5, images 71-88). These areas are associated with mildly increased T2 hyperintense signal. No acute hemorrhage, mass, mass effect, or midline shift. No hydrocephalus or extra-axial collection. Punctate foci of hemosiderin deposition in the posteroinferior right frontal lobe (series 14, image 36 and 44), right temporal lobe (series 14, images 26 and 29), and right occipital lobe (series 14, images 28 and 32), which may  represent small microhemorrhages or thrombi. Vascular: Please see MRA findings below. Skull and upper cervical spine: Normal marrow signal. Sinuses/Orbits: No acute finding. Other: The mastoids are well aerated. MRA HEAD FINDINGS Anterior circulation: Both internal carotid arteries are patent to the termini, without significant stenosis. Patent left A1. Aplastic right A1. Normal anterior communicating artery. Anterior cerebral arteries are patent to their distal aspects. No M1 stenosis or occlusion. Normal MCA bifurcations. Distal MCA branches perfused and symmetric. The previously noted occluded right M2 now appears patent, and flow is  seen in both the superior and inferior M3 branching from it. Posterior circulation: Vertebral arteries patent to the vertebrobasilar junction without stenosis. Diminutive left V4. Basilar patent to its distal aspect. Superior cerebellar arteries patent bilaterally. Patent P1 segments, diminutive on the right. Near fetal origin of the right PCA from a prominent right posterior communicating artery. The left posterior communicating artery is also patent. PCAs perfused to their distal aspects without stenosis. Anatomic variants: Near fetal origin of the right PCA. IMPRESSION: 1. Acute infarcts in the right insula and posteroinferior frontal lobe, with additional more scattered foci in the right parietal and occipital lobe. 2. The previously noted occluded distal right M2 now appears patent, and flow is seen in both the superior and inferior M3 branching from it. 3. No intracranial large vessel occlusion or significant stenosis. These results will be called to the ordering clinician or representative by the Radiologist Assistant, and communication documented in the PACS or [employer]. Electronically Signed   By: Wiliam Ke M.D.   On: 03/24/2022 23:29    MR ANGIO HEAD WO CONTRAST   Result Date: 03/24/2022 CLINICAL DATA:  Stroke follow-up, status post thrombectomy EXAM: MRI  HEAD WITHOUT CONTRAST MRA HEAD WITHOUT CONTRAST TECHNIQUE: Multiplanar, multi-echo pulse sequences of the brain and surrounding structures were acquired without intravenous contrast. Angiographic images of the Circle of Willis were acquired using MRA technique without intravenous contrast. COMPARISON:  No prior MRI, correlation is made with CT head and CTA head and neck 03/23/2022 FINDINGS: MRI HEAD FINDINGS Brain: Restricted diffusion with ADC correlate in the right insula and posteroinferior frontal lobe (series 5, images 76-85), with additional more scattered foci in the right parietal and occipital lobe (series 5, images 71-88). These areas are associated with mildly increased T2 hyperintense signal. No acute hemorrhage, mass, mass effect, or midline shift. No hydrocephalus or extra-axial collection. Punctate foci of hemosiderin deposition in the posteroinferior right frontal lobe (series 14, image 36 and 44), right temporal lobe (series 14, images 26 and 29), and right occipital lobe (series 14, images 28 and 32), which may represent small microhemorrhages or thrombi. Vascular: Please see MRA findings below. Skull and upper cervical spine: Normal marrow signal. Sinuses/Orbits: No acute finding. Other: The mastoids are well aerated. MRA HEAD FINDINGS Anterior circulation: Both internal carotid arteries are patent to the termini, without significant stenosis. Patent left A1. Aplastic right A1. Normal anterior communicating artery. Anterior cerebral arteries are patent to their distal aspects. No M1 stenosis or occlusion. Normal MCA bifurcations. Distal MCA branches perfused and symmetric. The previously noted occluded right M2 now appears patent, and flow is seen in both the superior and inferior M3 branching from it. Posterior circulation: Vertebral arteries patent to the vertebrobasilar junction without stenosis. Diminutive left V4. Basilar patent to its distal aspect. Superior cerebellar arteries patent  bilaterally. Patent P1 segments, diminutive on the right. Near fetal origin of the right PCA from a prominent right posterior communicating artery. The left posterior communicating artery is also patent. PCAs perfused to their distal aspects without stenosis. Anatomic variants: Near fetal origin of the right PCA. IMPRESSION: 1. Acute infarcts in the right insula and posteroinferior frontal lobe, with additional more scattered foci in the right parietal and occipital lobe. 2. The previously noted occluded distal right M2 now appears patent, and flow is seen in both the superior and inferior M3 branching from it. 3. No intracranial large vessel occlusion or significant stenosis. These results will be called to the ordering clinician or representative  by the Radiologist Assistant, and communication documented in the PACS or [employer]. Electronically Signed   By: Wiliam Ke M.D.   On: 03/24/2022 23:29    ECHOCARDIOGRAM COMPLETE   Result Date: 03/24/2022    ECHOCARDIOGRAM REPORT   Patient Name:   Brian Aguayo. Date of Exam: 03/24/2022 Medical Rec #:  [id number]            Height:       70.5 in Accession #:    [id number]           Weight:       254.0 lb Date of Birth:  11/29/1965             BSA:          2.323 m Patient Age:    56 years             BP:           147/82 mmHg Patient Gender: M                    HR:           71 bpm. Exam Location:  Inpatient Procedure: 2D Echo, Cardiac Doppler and Color Doppler Indications:    I163.9 Stroke  History:        Patient has prior history of Echocardiogram examinations, most                 recent 05/10/2016. Arrythmias:Atrial Fibrillation; Risk                 Factors:Hypertension and Diabetes. Tobacco abuse (From Hx).  Sonographer:    Celesta Gentile RCS Referring Phys: [phone] MCNEILL P KIRKPATRICK IMPRESSIONS  1. Left ventricular ejection fraction, by estimation, is 60 to 65%. The left ventricle has normal function. The left ventricle has no regional wall  motion abnormalities. There is mild concentric left ventricular hypertrophy. Diastolic function indeterminant due to Afib.  2. Right ventricular systolic function is mildly reduced. The right ventricular size is normal. Tricuspid regurgitation signal is inadequate for assessing PA pressure.  3. Left atrial size was moderately dilated.  4. Right atrial size was mildly dilated.  5. The mitral valve is abnormal. Mild to moderate mitral valve regurgitation.  6. The aortic valve is tricuspid. There is mild thickening of the aortic valve. Aortic valve regurgitation is mild. Aortic valve sclerosis is present, with no evidence of aortic valve stenosis.  7. The inferior vena cava is dilated in size with <50% respiratory variability, suggesting right atrial pressure of 15 mmHg. Comparison(s): Compared to prior TTE report in 2017, there is no significant change. Conclusion(s)/Recommendation(s): No intracardiac source of embolism detected on this transthoracic study. Consider a transesophageal echocardiogram to exclude cardiac source of embolism if clinically indicated. FINDINGS  Left Ventricle: Left ventricular ejection fraction, by estimation, is 60 to 65%. The left ventricle has normal function. The left ventricle has no regional wall motion abnormalities. Definity contrast agent was given IV to delineate the left ventricular  endocardial borders. The left ventricular internal cavity size was normal in size. There is mild concentric left ventricular hypertrophy. Diastolic function indeterminant due to Afib. Right Ventricle: The right ventricular size is normal. Right vetricular wall thickness was not well visualized. Right ventricular systolic function is mildly reduced. Tricuspid regurgitation signal is inadequate for assessing PA pressure. Left Atrium: Left atrial size was moderately dilated. Right Atrium: Right atrial size was mildly dilated. Pericardium: There is no evidence of  pericardial effusion. Mitral Valve: The  mitral valve is abnormal. There is mild thickening of the mitral valve leaflet(s). Mild mitral annular calcification. Mild to moderate mitral valve regurgitation. Tricuspid Valve: The tricuspid valve is normal in structure. Tricuspid valve regurgitation is trivial. Aortic Valve: The aortic valve is tricuspid. There is mild thickening of the aortic valve. Aortic valve regurgitation is mild. Aortic valve sclerosis is present, with no evidence of aortic valve stenosis. Pulmonic Valve: The pulmonic valve was not well visualized. Pulmonic valve regurgitation is trivial. Aorta: The aortic root is normal in size and structure. Venous: The inferior vena cava is dilated in size with less than 50% respiratory variability, suggesting right atrial pressure of 15 mmHg. IAS/Shunts: The atrial septum is grossly normal.  LEFT VENTRICLE PLAX 2D LVIDd:         4.40 cm LVIDs:         2.90 cm LV PW:         1.20 cm LV IVS:        1.10 cm LVOT diam:     1.90 cm LV SV:         48 LV SV Index:   21 LVOT Area:     2.84 cm  RIGHT VENTRICLE TAPSE (M-mode): 1.6 cm LEFT ATRIUM             Index        RIGHT ATRIUM           Index LA diam:        4.70 cm 2.02 cm/m   RA Area:     26.10 cm LA Vol (A2C):   96.6 ml 41.59 ml/m  RA Volume:   91.40 ml  39.35 ml/m LA Vol (A4C):   75.1 ml 32.33 ml/m LA Biplane Vol: 92.9 ml 40.00 ml/m  AORTIC VALVE LVOT Vmax:   93.30 cm/s LVOT Vmean:  58.000 cm/s LVOT VTI:    0.169 m  AORTA Ao Root diam: 2.85 cm MITRAL VALVE MV Area (PHT): 4.80 cm     SHUNTS MV Decel Time: 158 msec     Systemic VTI:  0.17 m MV E velocity: 148.00 cm/s  Systemic Diam: 1.90 cm Laurance Flatten MD Electronically signed by Laurance Flatten MD Signature Date/Time: 03/24/2022/2:58:07 PM    Final     CT ANGIO HEAD NECK W WO CM (CODE STROKE)   Result Date: 03/23/2022 CLINICAL DATA:  Left facial droop, slurred speech EXAM: CT ANGIOGRAPHY HEAD AND NECK TECHNIQUE: Multidetector CT imaging of the head and neck was performed using the  standard protocol during bolus administration of intravenous contrast. Multiplanar CT image reconstructions and MIPs were obtained to evaluate the vascular anatomy. Carotid stenosis measurements (when applicable) are obtained utilizing NASCET criteria, using the distal internal carotid diameter as the denominator. RADIATION DOSE REDUCTION: This exam was performed according to the departmental dose-optimization program which includes automated exposure control, adjustment of the mA and/or kV according to patient size and/or use of iterative reconstruction technique. CONTRAST:  75mL OMNIPAQUE IOHEXOL 350 MG/ML SOLN COMPARISON:  No prior CTA available, correlation is made with CT head 03/23/2022 FINDINGS: CT HEAD FINDINGS For noncontrast findings, please see same day CT head. CTA NECK FINDINGS Aortic arch: Two-vessel arch with a common origin of the brachiocephalic and left common carotid arteries. Imaged portion shows no evidence of aneurysm or dissection. No significant stenosis of the major arch vessel origins. Right carotid system: No evidence of dissection, occlusion, or hemodynamically significant stenosis (greater than 50%). Left carotid  system: No evidence of dissection, occlusion, or hemodynamically significant stenosis (greater than 50%). Vertebral arteries: No evidence of dissection, occlusion, or hemodynamically significant stenosis (greater than 50%). Skeleton: No acute osseous abnormality. Other neck: No acute finding. Upper chest: No focal pulmonary opacity or pleural effusion. Review of the MIP images confirms the above findings CTA HEAD FINDINGS Anterior circulation: Both internal carotid arteries are patent to the termini, without significant stenosis. Patent left A1. Aplastic right A1. Normal anterior communicating artery. Anterior cerebral arteries are patent to their distal aspects. No M1 stenosis or occlusion. A right M2 appears occluded, just proximal to the bifurcation (series 7, image [id number]),  with nonopacification of the more inferior M3 branch and opacification of the more superior branch. MCA branches otherwise perfused and symmetric. Posterior circulation: Vertebral arteries patent to the vertebrobasilar junction without stenosis, although the left V4 is quite diminutive after the PICA takeoff. Posterior inferior cerebellar arteries patent proximally. Basilar patent to its distal aspect. Superior cerebellar arteries patent proximally. Patent P1 segments, diminutive on the right. Near fetal origin of the right PCA from a prominent right posterior communicating artery. The left posterior communicating artery is also patent. PCAs perfused to their distal aspects without stenosis. Venous sinuses: As permitted by contrast timing, patent. Anatomic variants: Near fetal origin of the right PCA. Review of the MIP images confirms the above findings IMPRESSION: 1. Occlusion of a distal right M2, near the bifurcation, with nonopacification of the more inferior M3 branch and opacification of the more superior branch. 2. Otherwise no intracranial hemodynamically significant stenosis. 3.  No hemodynamically significant stenosis in the neck. These findings were discussed by telephone on 03/23/2022 at 11:46 pm with provider MCNEILL KIRKPATRICK . Electronically Signed   By: Wiliam Ke M.D.   On: 03/23/2022 23:55    CT HEAD CODE STROKE WO CONTRAST   Result Date: 03/23/2022 CLINICAL DATA:  Code stroke. Left-sided weakness, facial droop, slurred speech EXAM: CT HEAD WITHOUT CONTRAST TECHNIQUE: Contiguous axial images were obtained from the base of the skull through the vertex without intravenous contrast. RADIATION DOSE REDUCTION: This exam was performed according to the departmental dose-optimization program which includes automated exposure control, adjustment of the mA and/or kV according to patient size and/or use of iterative reconstruction technique. COMPARISON:  None Available. FINDINGS: Brain: No evidence of  acute infarction, hemorrhage, cerebral edema, mass, mass effect, or midline shift. No hydrocephalus or extra-axial collection. Vascular: No hyperdense vessel. Skull: Negative for fracture or focal lesion. Sinuses/Orbits: No acute finding. Other: The mastoid air cells are well aerated. ASPECTS [facility] Stroke Program Early CT Score) - Ganglionic level infarction (caudate, lentiform nuclei, internal capsule, insula, M1-M3 cortex): 7 - Supraganglionic infarction (M4-M6 cortex): 3 Total score (0-10 with 10 being normal): 10 IMPRESSION: No evidence of acute intracranial process. ASPECTS is 10. Code stroke imaging results were communicated on 03/23/2022 at 11:33 pm to provider Dr. Amada Jupiter via secure text paging. Electronically Signed   By: Wiliam Ke M.D.   On: 03/23/2022 23:33       Assessment & Plan:   Problem List Items Addressed This Visit   None   No orders of the defined types were placed in this encounter.  Patient will receive fecal occult kit for colon cancer screening Follow-up: No follow-ups on file.    Shan Levans, MD

## 2023-01-28 ENCOUNTER — Ambulatory Visit: Payer: 59 | Admitting: Critical Care Medicine

## 2023-01-31 ENCOUNTER — Encounter: Payer: Self-pay | Admitting: Critical Care Medicine

## 2023-01-31 ENCOUNTER — Other Ambulatory Visit: Payer: Self-pay

## 2023-01-31 ENCOUNTER — Ambulatory Visit: Payer: 59 | Attending: Critical Care Medicine | Admitting: Critical Care Medicine

## 2023-01-31 VITALS — BP 161/109 | HR 94 | Temp 98.1°F | Ht 70.0 in | Wt 239.0 lb

## 2023-01-31 DIAGNOSIS — Z7984 Long term (current) use of oral hypoglycemic drugs: Secondary | ICD-10-CM | POA: Diagnosis not present

## 2023-01-31 DIAGNOSIS — E119 Type 2 diabetes mellitus without complications: Secondary | ICD-10-CM | POA: Diagnosis not present

## 2023-01-31 DIAGNOSIS — I10 Essential (primary) hypertension: Secondary | ICD-10-CM | POA: Diagnosis not present

## 2023-01-31 DIAGNOSIS — E7849 Other hyperlipidemia: Secondary | ICD-10-CM

## 2023-01-31 DIAGNOSIS — E559 Vitamin D deficiency, unspecified: Secondary | ICD-10-CM

## 2023-01-31 DIAGNOSIS — Z1211 Encounter for screening for malignant neoplasm of colon: Secondary | ICD-10-CM | POA: Diagnosis not present

## 2023-01-31 DIAGNOSIS — I482 Chronic atrial fibrillation, unspecified: Secondary | ICD-10-CM | POA: Diagnosis not present

## 2023-01-31 DIAGNOSIS — E1165 Type 2 diabetes mellitus with hyperglycemia: Secondary | ICD-10-CM

## 2023-01-31 LAB — POCT GLYCOSYLATED HEMOGLOBIN (HGB A1C): Hemoglobin A1C: 5.7 % — AB (ref 4.0–5.6)

## 2023-01-31 MED ORDER — VITAMIN D (ERGOCALCIFEROL) 1.25 MG (50000 UNIT) PO CAPS
50000.0000 [IU] | ORAL_CAPSULE | ORAL | 2 refills | Status: DC
Start: 2023-01-31 — End: 2023-12-16
  Filled 2023-01-31: qty 4, 28d supply, fill #0

## 2023-01-31 MED ORDER — ATORVASTATIN CALCIUM 20 MG PO TABS
20.0000 mg | ORAL_TABLET | Freq: Every day | ORAL | 2 refills | Status: DC
  Filled 2023-01-31: qty 90, 90d supply, fill #0

## 2023-01-31 MED ORDER — VALSARTAN 320 MG PO TABS
320.0000 mg | ORAL_TABLET | Freq: Every day | ORAL | 3 refills | Status: DC
  Filled 2023-01-31: qty 90, 90d supply, fill #0

## 2023-01-31 MED ORDER — TADALAFIL 5 MG PO TABS
5.0000 mg | ORAL_TABLET | Freq: Every day | ORAL | 11 refills | Status: AC | PRN
  Filled 2023-01-31: qty 10, 10d supply, fill #0
  Filled 2023-01-31: qty 10, 30d supply, fill #0

## 2023-01-31 MED ORDER — AMLODIPINE BESYLATE 10 MG PO TABS
10.0000 mg | ORAL_TABLET | Freq: Every day | ORAL | 2 refills | Status: DC
  Filled 2023-01-31: qty 90, 90d supply, fill #0

## 2023-01-31 MED ORDER — APIXABAN 5 MG PO TABS
5.0000 mg | ORAL_TABLET | Freq: Two times a day (BID) | ORAL | 2 refills | Status: DC
Start: 2023-01-31 — End: 2023-10-30
  Filled 2023-01-31: qty 180, 90d supply, fill #0

## 2023-01-31 MED ORDER — METFORMIN HCL 500 MG PO TABS
500.0000 mg | ORAL_TABLET | Freq: Every day | ORAL | 1 refills | Status: DC
Start: 2023-01-31 — End: 2023-05-12
  Filled 2023-01-31: qty 90, 90d supply, fill #0

## 2023-01-31 MED ORDER — BISOPROLOL FUMARATE 10 MG PO TABS
20.0000 mg | ORAL_TABLET | Freq: Every day | ORAL | 2 refills | Status: DC
  Filled 2023-01-31: qty 90, 45d supply, fill #0

## 2023-01-31 NOTE — Patient Instructions (Signed)
Start valsartan one daily for blood pressure No other medication changes all medications sent to you pharmacy for refills Labs today Colon cancer screen kit given at lab Return 3 weeks for blood pressure check with RN Return Dr Delford Field 6 weeks

## 2023-01-31 NOTE — Progress Notes (Unsigned)
Established Patient Office Visit  Subjective:  Patient ID: Brian Sale., male    DOB: 10-01-1965  Age: 57 y.o. MRN: [id number]  CC:  No chief complaint on file.   HPI 06/2021 Brian Sale. presents for follow-up of hypertension.  Patient became confused as to whether he should be taking his blood pressure medicines daily or not but tickly when his insurance company sent him a mail order supply of prepackaged medications.  He has not been on his bisoprolol or amlodipine for about 2 weeks and on arrival blood pressure is 148/83.  When he has been compliant with his medicines blood pressure has been well controlled.  He currently does not have any symptoms referable to the blood pressure.  He is also still smoking 5 cigarettes daily.  Patient was in the emergency room last week for acute onset of pyelonephritis with left-sided flank pain.  He is now taking a course of Bactrim and has seen improvement in symptoms.  He is trying to hydrate himself more at this time.  Patient did declined the flu vaccine at this visit  Patient is due colon cancer screening and was sent a Cologuard kit but did not process it properly.  We will have him do a fecal occult kit here in the office.   12/7 patient seen in direct follow-up on arrival blood pressure 133/95 on recheck it remains elevated.  Patient has had a prior stroke involving the cerebellum on the right affecting left side with left-sided weakness and dizziness that is persisting.  Patient is having difficulty with ambulation and would like a cane for this.  He had not yet gotten much physical therapy and needs another referral for this.  He has a rash in the left lower extremity and pain in the right knee.  Patient needs better blood pressure management. Patient does have type 2 diabetes on metformin alone with good control Patient is no longer smoking  Below is a copy of the recent discharge Adm 02/2022 for CVA: Date of Admission:  03/23/2022 Date of Discharge: 03/25/2022   Attending Physician:  Stroke, Md, MD, Stroke MD Patient's PCP:  Storm Frisk, MD   DISCHARGE DIAGNOSIS:  Principal Problem:   Stroke:  right MCA stroke s/p TNK and IR with TICI2c, likely due to afib not compliant with eliquis   Active Problems:   hyperlipidemia   Essential hypertension   Tobacco use   Obesity (BMI 30-39.9)   Chronic atrial fibrillation (HCC)   Controlled type 2 diabetes mellitus with hyperglycemia   Non-traumatic subconjunctival hemorrhage, bilateral   Medication List       STOP taking these medications     albuterol 108 (90 Base) MCG/ACT inhaler Commonly known as: VENTOLIN HFA    Moderna COVID-19 Bivalent 50 MCG/0.5ML injection Generic drug: COVID-19 mRNA bivalent vaccine (Moderna)           TAKE these medications     5 Series BP Monitor Devi 1 Units by Does not apply route daily. Measure blood pressure daily    acetaminophen 500 MG tablet Commonly known as: TYLENOL Take 2 tablets (1,000 mg total) by mouth every 6 (six) hours as needed for headache.    amLODipine 10 MG tablet Commonly known as: NORVASC Take 1 tablet (10 mg total) by mouth daily. Start taking on: March 26, 2022    atorvastatin 20 MG tablet Commonly known as: LIPITOR Take 1 tablet (20 mg total) by mouth daily. Start taking on: March 26, 2022 What changed:  medication strength how much to take    bisoprolol 10 MG tablet Commonly known as: ZEBETA Take 1 tablet (10 mg total) by mouth daily. Start taking on: March 26, 2022    blood glucose meter kit and supplies Kit Dispense based on patient and insurance preference. Use up to four times daily as directed. (FOR ICD-9 250.00, 250.01).    cyclobenzaprine 5 MG tablet Commonly known as: FLEXERIL Take 1 tablet (5 mg total) by mouth 3 (three) times daily as needed for muscle spasms.    Eliquis 5 MG Tabs tablet Generic drug: apixaban Take 1 tablet by mouth twice daily. Appt  needed What changed: See the new instructions.    metFORMIN 500 MG tablet Commonly known as: GLUCOPHAGE Take 1 tablet (500 mg total) by mouth daily with breakfast.    polyvinyl alcohol 1.4 % ophthalmic solution Commonly known as: LIQUIFILM TEARS Place 1 drop into both eyes as needed for dry eyes.    Vitamin D (Ergocalciferol) 1.25 MG (50000 UNIT) Caps capsule Commonly known as: DRISDOL Take 1 capsule (50,000 Units total) by mouth every 7 (seven) days.             LABORATORY STUDIES CBC Labs (Brief)          Component Value Date/Time    WBC 7.6 03/25/2022 0259    RBC 4.56 03/25/2022 0259    HGB 13.7 03/25/2022 0259    HGB 16.2 08/14/2020 1519    HCT 40.6 03/25/2022 0259    HCT 49.2 08/14/2020 1519    PLT 185 03/25/2022 0259    PLT 244 08/14/2020 1519    MCV 89.0 03/25/2022 0259    MCV 90 08/14/2020 1519    MCH 30.0 03/25/2022 0259    MCHC 33.7 03/25/2022 0259    RDW 14.5 03/25/2022 0259    RDW 13.5 08/14/2020 1519    LYMPHSABS 2.9 03/23/2022 2315    LYMPHSABS 3.2 (H) 08/14/2020 1519    MONOABS 0.8 03/23/2022 2315    EOSABS 0.1 03/23/2022 2315    EOSABS 0.1 08/14/2020 1519    BASOSABS 0.0 03/23/2022 2315    BASOSABS 0.0 08/14/2020 1519      CMP Labs (Brief)          Component Value Date/Time    NA 140 03/25/2022 0259    NA 138 01/09/2022 1443    K 3.4 (L) 03/25/2022 0259    CL 103 03/25/2022 0259    CO2 26 03/25/2022 0259    GLUCOSE 120 (H) 03/25/2022 0259    BUN 8 03/25/2022 0259    BUN 11 01/09/2022 1443    CREATININE 1.13 03/25/2022 0259    CREATININE 1.15 12/04/2015 1731    CALCIUM 8.5 (L) 03/25/2022 0259    PROT 7.6 03/23/2022 2315    PROT 7.1 01/09/2022 1443    ALBUMIN 3.5 03/23/2022 2315    ALBUMIN 4.1 01/09/2022 1443    AST 23 03/23/2022 2315    ALT 15 03/23/2022 2315    ALKPHOS 75 03/23/2022 2315    BILITOT 0.4 03/23/2022 2315    BILITOT <0.2 01/09/2022 1443    GFRNONAA >60 03/25/2022 0259    GFRNONAA 74 12/04/2015 1731    GFRAA 89  08/14/2020 1519    GFRAA 85 12/04/2015 1731      COAGS Recent Labs       Lab Results  Component Value Date    INR 1.1 03/23/2022    INR 1.3 (H) 09/01/2018  INR 1.02 06/23/2015      Lipid Panel Labs (Brief)          Component Value Date/Time    CHOL 124 03/24/2022 0249    CHOL 185 08/14/2020 1519    TRIG 40 03/24/2022 0249    HDL 34 (L) 03/24/2022 0249    HDL 46 08/14/2020 1519    CHOLHDL 3.6 03/24/2022 0249    VLDL 8 03/24/2022 0249    LDLCALC 82 03/24/2022 0249    LDLCALC 119 (H) 08/14/2020 1519      HgbA1C  Recent Labs       Lab Results  Component Value Date    HGBA1C 5.8 (H) 03/24/2022      Urinalysis Labs (Brief)          Component Value Date/Time    COLORURINE AMBER (A) 04/18/2016 1722    APPEARANCEUR Clear 08/14/2020 1519    LABSPEC >=1.030 06/28/2021 1634    PHURINE 5.5 06/28/2021 1634    GLUCOSEU NEGATIVE 06/28/2021 1634    HGBUR TRACE (A) 06/28/2021 1634    BILIRUBINUR SMALL (A) 06/28/2021 1634    BILIRUBINUR Negative 08/14/2020 1519    KETONESUR TRACE (A) 06/28/2021 1634    PROTEINUR NEGATIVE 06/28/2021 1634    UROBILINOGEN 0.2 06/28/2021 1634    NITRITE NEGATIVE 06/28/2021 1634    LEUKOCYTESUR SMALL (A) 06/28/2021 1634      Urine Drug Screen  Labs (Brief)  No results found for: "LABOPIA", "COCAINSCRNUR", "LABBENZ", "AMPHETMU", "THCU", "LABBARB"    Alcohol Level Labs (Brief)          Component Value Date/Time    ETH <10 03/23/2022 2315              HISTORY OF PRESENT ILLNESS       HOSPITAL COURSE  Mr. Lorenso Ellman. is a 57 y.o. male with history of atrial fibrillation on Eliquis with patient having not taken this medication for about two weeks presenting with acute onset dizziness and left sided weakness with a fall.  He was given TNK (he had missed multiple doses of his Eliquis) and taken to IR for mechanical thrombectomy of the right MCA.  TICI 2c flow was achieved   Stroke:  right MCA stroke s/p TNK and IR with  TICI2c, likely due to afib not compliant with eliquis Code Stroke CT head No acute abnormality. ASPECTS 10.    CTA head & neck occlusion of distal right M2 IR showed right M3 occlusion x2, status post TICI2c reperfusion MRI R MCA scattered infarcts MRA previously noted occluded distal right M2 now appears patent, and flow is seen in both the superior and inferior M3 branch 2D Echo EF 60 to 65% LDL 82 HgbA1c 5.8 VTE prophylaxis - SCDs Eliquis (apixaban) daily prior to admission (but patient not compliant with medication), now on ASA. Plan to restart eliquis in am Therapy recommendations:  none Disposition:  home   Hypertension Home meds:  amlodipine 10 mg daily, bisoprolol 10 mg daily Stable Long-term BP goal normotensive   Atrial fibrillation Patient has persistent atrial fibrillation Was prescribed Eliquis but had not taken this medication for past two weeks and when he was taken he took only once a day Discussed importance of taking Eliquis as prescribed Now on ASA, will start Eliquis in am   Hyperlipidemia Home meds: Lipitor 10 LDL 82, goal < 70 On atorvastatin 20 mg daily  High intensity statin not indicated as LDL near goal Continue statin at discharge   Tobacco  abuse Current smoker Smoking cessation counseling provided Pt is willing to quit   Other Stroke Risk Factors Obesity, Body mass index is 35.93 kg/m., BMI >/= 30 associated with increased stroke risk, recommend weight loss, diet and exercise as appropriate    Other Active Problems Bilateral eye subconjunctival hemorrhage post TNK       DISCHARGE EXAM Blood pressure (!) 157/113, pulse (!) 109, temperature 98.2 F (36.8 C), temperature source Oral, resp. rate 20, height 5' 10.5" (1.791 m), SpO2 96 %.   08/01/2022 Patient seen in return follow-up on arrival blood pressure is mildly elevated 143/90 patient has no symptoms from this.  He he does have low back pain he is finished a course of physical therapy.  He  has no other real complaints.  He has not been back in the hospital.  He has had no further stroke symptoms.  He is taking his preventive therapy.  Maintains amlodipine 10 mg daily and bisoprolol 10 mg daily he is requesting erectile dysfunction medicine he has just been released from physical therapy  8/9  Past Medical History:  Diagnosis Date   Avulsion fracture of distal fibula 06/02/2013   Right distal fibula occurred 04/30/2013    Dysuria 08/14/2020   Hordeolum externum left upper eyelid 06/29/2019   Hypertension    Persistent atrial fibrillation (HCC)    a. newly diagnosed on 05/2015 admission. started on coumadin.    Pre-diabetes    a. HgA1c 6.3 05/2015   Tobacco abuse     Past Surgical History:  Procedure Laterality Date   IR CT HEAD LTD  03/24/2022   IR PERCUTANEOUS ART THROMBECTOMY/INFUSION INTRACRANIAL INC DIAG ANGIO  03/24/2022   IR US GUIDE VASC ACCESS RIGHT  03/24/2022   RADIOLOGY WITH ANESTHESIA N/A 03/24/2022   Procedure: IR WITH ANESTHESIA;  Surgeon: Radiologist, Medication, MD;  Location: [facility] OR;  Service: Radiology;  Laterality: N/A;    Family History  Problem Relation Age of Onset   Hypertension Father    Diabetes Maternal Grandmother    Hypertension Maternal Grandmother    Diabetes Maternal Grandfather    Hypertension Maternal Grandfather     Social History   Socioeconomic History   Marital status: Married    Spouse name: Not on file   Number of children: Not on file   Years of education: Not on file   Highest education level: Not on file  Occupational History   Not on file  Tobacco Use   Smoking status: Former    Current packs/day: 0.10    Types: Cigarettes    Passive exposure: Never   Smokeless tobacco: Never  Vaping Use   Vaping status: Never Used  Substance and Sexual Activity   Alcohol use: No   Drug use: No   Sexual activity: Not Currently  Other Topics Concern   Not on file  Social History Narrative   Not on file   Social Determinants  of Health   Financial Resource Strain: Low Risk  (05/22/2022)   Overall Financial Resource Strain (CARDIA)    Difficulty of Paying Living Expenses: Not hard at all  Food Insecurity: No Food Insecurity (09/03/2022)   Hunger Vital Sign    Worried About Running Out of Food in the Last Year: Never true    Ran Out of Food in the Last Year: Never true  Transportation Needs: No Transportation Needs (09/03/2022)   PRAPARE - Administrator, Civil Service (Medical): No    Lack of Transportation (Non-Medical): No  Physical Activity: Inactive (05/22/2022)   Exercise Vital Sign    Days of Exercise per Week: 0 days    Minutes of Exercise per Session: 0 min  Stress: No Stress Concern Present (05/22/2022)   [employer] of Occupational Health - Occupational Stress Questionnaire    Feeling of Stress : Only a little  Social Connections: Not on file  Intimate Partner Violence: Not on file    Outpatient Medications Prior to Visit  Medication Sig Dispense Refill   acetaminophen (TYLENOL) 500 MG tablet Take 2 tablets (1,000 mg total) by mouth every 6 (six) hours as needed for headache. 60 tablet 2   amLODipine (NORVASC) 10 MG tablet Take 1 tablet (10 mg total) by mouth daily. 90 tablet 2   apixaban (ELIQUIS) 5 MG TABS tablet Take 1 tablet (5 mg total) by mouth 2 (two) times daily. 180 tablet 2   atorvastatin (LIPITOR) 20 MG tablet Take 1 tablet (20 mg total) by mouth daily. 90 tablet 2   bisoprolol (ZEBETA) 10 MG tablet Take 2 tablets (20 mg total) by mouth daily. 90 tablet 2   blood glucose meter kit and supplies KIT Dispense based on patient and insurance preference. Use up to four times daily as directed. (FOR ICD-9 250.00, 250.01). 1 each 0   Blood Pressure Monitoring (5 SERIES BP MONITOR) DEVI 1 Units by Does not apply route daily. Measure blood pressure daily 1 each 0   clobetasol cream (TEMOVATE) 0.05 % APPLY TO AFFECTED AREA TWICE A DAY 30 g 0   cyclobenzaprine (FLEXERIL) 5 MG  tablet Take 1 tablet (5 mg total) by mouth 3 (three) times daily as needed for muscle spasms. 60 tablet 2   diclofenac Sodium (VOLTAREN) 1 % GEL Apply 2 g topically 4 (four) times daily. To right knee 100 g 1   metFORMIN (GLUCOPHAGE) 500 MG tablet Take 1 tablet (500 mg total) by mouth daily with breakfast. 90 tablet 1   polyvinyl alcohol (LIQUIFILM TEARS) 1.4 % ophthalmic solution Place 1 drop into both eyes as needed for dry eyes. 15 mL 0   tadalafil (CIALIS) 5 MG tablet Take 1 tablet (5 mg total) by mouth daily as needed for erectile dysfunction. 10 tablet 11   Vitamin D, Ergocalciferol, (DRISDOL) 1.25 MG (50000 UNIT) CAPS capsule Take 1 capsule (50,000 Units total) by mouth every 7 (seven) days. 4 capsule 2   No facility-administered medications prior to visit.    No Known Allergies  ROS Review of Systems  Constitutional: Negative.  Negative for chills, diaphoresis and fever.  HENT: Negative.  Negative for congestion, ear pain, hearing loss, nosebleeds, postnasal drip, rhinorrhea, sinus pressure, sore throat, tinnitus, trouble swallowing and voice change.   Eyes: Negative.  Negative for photophobia and redness.  Respiratory: Negative.  Negative for apnea, cough, choking, chest tightness, shortness of breath, wheezing and stridor.   Cardiovascular: Negative.  Negative for chest pain, palpitations and leg swelling.  Gastrointestinal: Negative.  Negative for abdominal distention, abdominal pain, blood in stool, constipation, diarrhea, nausea and vomiting.  Endocrine: Negative for polydipsia.  Genitourinary: Negative.  Negative for flank pain, frequency, hematuria and urgency.  Musculoskeletal:  Positive for back pain. Negative for arthralgias, myalgias and neck pain.  Skin: Negative.  Negative for rash.  Allergic/Immunologic: Negative.  Negative for environmental allergies and food allergies.  Neurological:  Negative for dizziness, tremors, seizures, syncope and headaches.  Hematological:  Negative.  Negative for adenopathy. Does not bruise/bleed easily.  Psychiatric/Behavioral: Negative.  Negative for agitation, sleep  disturbance and suicidal ideas. The patient is not nervous/anxious.       Objective:    Physical Exam Vitals reviewed.  Constitutional:      Appearance: Normal appearance. He is well-developed. He is obese. He is not diaphoretic.  HENT:     Head: Normocephalic and atraumatic.     Nose: No nasal deformity, septal deviation, mucosal edema or rhinorrhea.     Right Sinus: No maxillary sinus tenderness or frontal sinus tenderness.     Left Sinus: No maxillary sinus tenderness or frontal sinus tenderness.     Mouth/Throat:     Pharynx: No oropharyngeal exudate.  Eyes:     General: No scleral icterus.    Conjunctiva/sclera: Conjunctivae normal.     Pupils: Pupils are equal, round, and reactive to light.  Neck:     Thyroid: No thyromegaly.     Vascular: No carotid bruit or JVD.     Trachea: Trachea normal. No tracheal tenderness or tracheal deviation.  Cardiovascular:     Rate and Rhythm: Normal rate and regular rhythm.     Chest Wall: PMI is not displaced.     Pulses: Normal pulses. No decreased pulses.     Heart sounds: Normal heart sounds, S1 normal and S2 normal. Heart sounds not distant. No murmur heard.    No systolic murmur is present.     No diastolic murmur is present.     No friction rub. No gallop. No S3 or S4 sounds.  Pulmonary:     Effort: Pulmonary effort is normal. No tachypnea, accessory muscle usage or respiratory distress.     Breath sounds: Normal breath sounds. No stridor. No decreased breath sounds, wheezing, rhonchi or rales.  Chest:     Chest wall: No tenderness.  Abdominal:     General: Bowel sounds are normal. There is no distension.     Palpations: Abdomen is soft. Abdomen is not rigid.     Tenderness: There is no abdominal tenderness. There is right CVA tenderness. There is no left CVA tenderness, guarding or rebound.      Comments: Right costovertebral angle tenderness is improved from documentation in the emergency room  Musculoskeletal:        General: Normal range of motion.     Cervical back: Normal range of motion and neck supple. No edema, erythema or rigidity. No muscular tenderness. Normal range of motion.     Right lower leg: No edema.     Left lower leg: No edema.  Lymphadenopathy:     Head:     Right side of head: No submental or submandibular adenopathy.     Left side of head: No submental or submandibular adenopathy.     Cervical: No cervical adenopathy.  Skin:    General: Skin is warm and dry.     Coloration: Skin is not pale.     Findings: No rash.     Nails: There is no clubbing.  Neurological:     General: No focal deficit present.     Mental Status: He is alert and oriented to person, place, and time. Mental status is at baseline.     Sensory: No sensory deficit.  Psychiatric:        Mood and Affect: Mood normal.        Speech: Speech normal.        Behavior: Behavior normal.        Thought Content: Thought content normal.   Foot exam normal  There were  no vitals taken for this visit. Wt Readings from Last 3 Encounters:  08/01/22 247 lb 3.2 oz (112.1 kg)  05/30/22 249 lb 12.8 oz (113.3 kg)  05/22/22 250 lb (113.4 kg)     Health Maintenance Due  Topic Date Due   OPHTHALMOLOGY EXAM  Never done   DTaP/Tdap/Td (1 - Tdap) Never done   COLON CANCER SCREENING ANNUAL FOBT  Never done   Zoster Vaccines- Shingrix (1 of 2) Never done   HEMOGLOBIN A1C  11/29/2022   Diabetic kidney evaluation - Urine ACR  01/10/2023   INFLUENZA VACCINE  01/23/2023    There are no preventive care reminders to display for this patient.  Lab Results  Component Value Date   TSH 2.678 06/22/2015   Lab Results  Component Value Date   WBC 7.3 05/30/2022   HGB 13.9 05/30/2022   HCT 41.9 05/30/2022   MCV 86 05/30/2022   PLT 249 05/30/2022   Lab Results  Component Value Date   NA 141  05/30/2022   K 4.4 05/30/2022   CO2 24 05/30/2022   GLUCOSE 95 05/30/2022   BUN 12 05/30/2022   CREATININE 1.19 05/30/2022   BILITOT 0.5 05/30/2022   ALKPHOS 95 05/30/2022   AST 15 05/30/2022   ALT 13 05/30/2022   PROT 7.4 05/30/2022   ALBUMIN 4.0 05/30/2022   CALCIUM 9.1 05/30/2022   ANIONGAP 11 03/25/2022   EGFR 71 05/30/2022   Lab Results  Component Value Date   CHOL 115 05/30/2022   Lab Results  Component Value Date   HDL 43 05/30/2022   Lab Results  Component Value Date   LDLCALC 59 05/30/2022   Lab Results  Component Value Date   TRIG 55 05/30/2022   Lab Results  Component Value Date   CHOLHDL 2.7 05/30/2022   Lab Results  Component Value Date   HGBA1C 6.7 (H) 05/30/2022    SIGNIFICANT DIAGNOSTIC STUDIES  Imaging Results  MR BRAIN WO CONTRAST   Result Date: 03/24/2022 CLINICAL DATA:  Stroke follow-up, status post thrombectomy EXAM: MRI HEAD WITHOUT CONTRAST MRA HEAD WITHOUT CONTRAST TECHNIQUE: Multiplanar, multi-echo pulse sequences of the brain and surrounding structures were acquired without intravenous contrast. Angiographic images of the Circle of Willis were acquired using MRA technique without intravenous contrast. COMPARISON:  No prior MRI, correlation is made with CT head and CTA head and neck 03/23/2022 FINDINGS: MRI HEAD FINDINGS Brain: Restricted diffusion with ADC correlate in the right insula and posteroinferior frontal lobe (series 5, images 76-85), with additional more scattered foci in the right parietal and occipital lobe (series 5, images 71-88). These areas are associated with mildly increased T2 hyperintense signal. No acute hemorrhage, mass, mass effect, or midline shift. No hydrocephalus or extra-axial collection. Punctate foci of hemosiderin deposition in the posteroinferior right frontal lobe (series 14, image 36 and 44), right temporal lobe (series 14, images 26 and 29), and right occipital lobe (series 14, images 28 and 32), which may  represent small microhemorrhages or thrombi. Vascular: Please see MRA findings below. Skull and upper cervical spine: Normal marrow signal. Sinuses/Orbits: No acute finding. Other: The mastoids are well aerated. MRA HEAD FINDINGS Anterior circulation: Both internal carotid arteries are patent to the termini, without significant stenosis. Patent left A1. Aplastic right A1. Normal anterior communicating artery. Anterior cerebral arteries are patent to their distal aspects. No M1 stenosis or occlusion. Normal MCA bifurcations. Distal MCA branches perfused and symmetric. The previously noted occluded right M2 now appears patent, and flow is  seen in both the superior and inferior M3 branching from it. Posterior circulation: Vertebral arteries patent to the vertebrobasilar junction without stenosis. Diminutive left V4. Basilar patent to its distal aspect. Superior cerebellar arteries patent bilaterally. Patent P1 segments, diminutive on the right. Near fetal origin of the right PCA from a prominent right posterior communicating artery. The left posterior communicating artery is also patent. PCAs perfused to their distal aspects without stenosis. Anatomic variants: Near fetal origin of the right PCA. IMPRESSION: 1. Acute infarcts in the right insula and posteroinferior frontal lobe, with additional more scattered foci in the right parietal and occipital lobe. 2. The previously noted occluded distal right M2 now appears patent, and flow is seen in both the superior and inferior M3 branching from it. 3. No intracranial large vessel occlusion or significant stenosis. These results will be called to the ordering clinician or representative by the Radiologist Assistant, and communication documented in the PACS or [employer]. Electronically Signed   By: Wiliam Ke M.D.   On: 03/24/2022 23:29    MR ANGIO HEAD WO CONTRAST   Result Date: 03/24/2022 CLINICAL DATA:  Stroke follow-up, status post thrombectomy EXAM: MRI  HEAD WITHOUT CONTRAST MRA HEAD WITHOUT CONTRAST TECHNIQUE: Multiplanar, multi-echo pulse sequences of the brain and surrounding structures were acquired without intravenous contrast. Angiographic images of the Circle of Willis were acquired using MRA technique without intravenous contrast. COMPARISON:  No prior MRI, correlation is made with CT head and CTA head and neck 03/23/2022 FINDINGS: MRI HEAD FINDINGS Brain: Restricted diffusion with ADC correlate in the right insula and posteroinferior frontal lobe (series 5, images 76-85), with additional more scattered foci in the right parietal and occipital lobe (series 5, images 71-88). These areas are associated with mildly increased T2 hyperintense signal. No acute hemorrhage, mass, mass effect, or midline shift. No hydrocephalus or extra-axial collection. Punctate foci of hemosiderin deposition in the posteroinferior right frontal lobe (series 14, image 36 and 44), right temporal lobe (series 14, images 26 and 29), and right occipital lobe (series 14, images 28 and 32), which may represent small microhemorrhages or thrombi. Vascular: Please see MRA findings below. Skull and upper cervical spine: Normal marrow signal. Sinuses/Orbits: No acute finding. Other: The mastoids are well aerated. MRA HEAD FINDINGS Anterior circulation: Both internal carotid arteries are patent to the termini, without significant stenosis. Patent left A1. Aplastic right A1. Normal anterior communicating artery. Anterior cerebral arteries are patent to their distal aspects. No M1 stenosis or occlusion. Normal MCA bifurcations. Distal MCA branches perfused and symmetric. The previously noted occluded right M2 now appears patent, and flow is seen in both the superior and inferior M3 branching from it. Posterior circulation: Vertebral arteries patent to the vertebrobasilar junction without stenosis. Diminutive left V4. Basilar patent to its distal aspect. Superior cerebellar arteries patent  bilaterally. Patent P1 segments, diminutive on the right. Near fetal origin of the right PCA from a prominent right posterior communicating artery. The left posterior communicating artery is also patent. PCAs perfused to their distal aspects without stenosis. Anatomic variants: Near fetal origin of the right PCA. IMPRESSION: 1. Acute infarcts in the right insula and posteroinferior frontal lobe, with additional more scattered foci in the right parietal and occipital lobe. 2. The previously noted occluded distal right M2 now appears patent, and flow is seen in both the superior and inferior M3 branching from it. 3. No intracranial large vessel occlusion or significant stenosis. These results will be called to the ordering clinician or representative  by the Radiologist Assistant, and communication documented in the PACS or [employer]. Electronically Signed   By: Wiliam Ke M.D.   On: 03/24/2022 23:29    ECHOCARDIOGRAM COMPLETE   Result Date: 03/24/2022    ECHOCARDIOGRAM REPORT   Patient Name:   Brian Jakob. Date of Exam: 03/24/2022 Medical Rec #:  [id number]            Height:       70.5 in Accession #:    [id number]           Weight:       254.0 lb Date of Birth:  Jul 29, 1965             BSA:          2.323 m Patient Age:    56 years             BP:           147/82 mmHg Patient Gender: M                    HR:           71 bpm. Exam Location:  Inpatient Procedure: 2D Echo, Cardiac Doppler and Color Doppler Indications:    I163.9 Stroke  History:        Patient has prior history of Echocardiogram examinations, most                 recent 05/10/2016. Arrythmias:Atrial Fibrillation; Risk                 Factors:Hypertension and Diabetes. Tobacco abuse (From Hx).  Sonographer:    Celesta Gentile RCS Referring Phys: [phone] MCNEILL P KIRKPATRICK IMPRESSIONS  1. Left ventricular ejection fraction, by estimation, is 60 to 65%. The left ventricle has normal function. The left ventricle has no regional wall  motion abnormalities. There is mild concentric left ventricular hypertrophy. Diastolic function indeterminant due to Afib.  2. Right ventricular systolic function is mildly reduced. The right ventricular size is normal. Tricuspid regurgitation signal is inadequate for assessing PA pressure.  3. Left atrial size was moderately dilated.  4. Right atrial size was mildly dilated.  5. The mitral valve is abnormal. Mild to moderate mitral valve regurgitation.  6. The aortic valve is tricuspid. There is mild thickening of the aortic valve. Aortic valve regurgitation is mild. Aortic valve sclerosis is present, with no evidence of aortic valve stenosis.  7. The inferior vena cava is dilated in size with <50% respiratory variability, suggesting right atrial pressure of 15 mmHg. Comparison(s): Compared to prior TTE report in 2017, there is no significant change. Conclusion(s)/Recommendation(s): No intracardiac source of embolism detected on this transthoracic study. Consider a transesophageal echocardiogram to exclude cardiac source of embolism if clinically indicated. FINDINGS  Left Ventricle: Left ventricular ejection fraction, by estimation, is 60 to 65%. The left ventricle has normal function. The left ventricle has no regional wall motion abnormalities. Definity contrast agent was given IV to delineate the left ventricular  endocardial borders. The left ventricular internal cavity size was normal in size. There is mild concentric left ventricular hypertrophy. Diastolic function indeterminant due to Afib. Right Ventricle: The right ventricular size is normal. Right vetricular wall thickness was not well visualized. Right ventricular systolic function is mildly reduced. Tricuspid regurgitation signal is inadequate for assessing PA pressure. Left Atrium: Left atrial size was moderately dilated. Right Atrium: Right atrial size was mildly dilated. Pericardium: There is no evidence of  pericardial effusion. Mitral Valve: The  mitral valve is abnormal. There is mild thickening of the mitral valve leaflet(s). Mild mitral annular calcification. Mild to moderate mitral valve regurgitation. Tricuspid Valve: The tricuspid valve is normal in structure. Tricuspid valve regurgitation is trivial. Aortic Valve: The aortic valve is tricuspid. There is mild thickening of the aortic valve. Aortic valve regurgitation is mild. Aortic valve sclerosis is present, with no evidence of aortic valve stenosis. Pulmonic Valve: The pulmonic valve was not well visualized. Pulmonic valve regurgitation is trivial. Aorta: The aortic root is normal in size and structure. Venous: The inferior vena cava is dilated in size with less than 50% respiratory variability, suggesting right atrial pressure of 15 mmHg. IAS/Shunts: The atrial septum is grossly normal.  LEFT VENTRICLE PLAX 2D LVIDd:         4.40 cm LVIDs:         2.90 cm LV PW:         1.20 cm LV IVS:        1.10 cm LVOT diam:     1.90 cm LV SV:         48 LV SV Index:   21 LVOT Area:     2.84 cm  RIGHT VENTRICLE TAPSE (M-mode): 1.6 cm LEFT ATRIUM             Index        RIGHT ATRIUM           Index LA diam:        4.70 cm 2.02 cm/m   RA Area:     26.10 cm LA Vol (A2C):   96.6 ml 41.59 ml/m  RA Volume:   91.40 ml  39.35 ml/m LA Vol (A4C):   75.1 ml 32.33 ml/m LA Biplane Vol: 92.9 ml 40.00 ml/m  AORTIC VALVE LVOT Vmax:   93.30 cm/s LVOT Vmean:  58.000 cm/s LVOT VTI:    0.169 m  AORTA Ao Root diam: 2.85 cm MITRAL VALVE MV Area (PHT): 4.80 cm     SHUNTS MV Decel Time: 158 msec     Systemic VTI:  0.17 m MV E velocity: 148.00 cm/s  Systemic Diam: 1.90 cm Laurance Flatten MD Electronically signed by Laurance Flatten MD Signature Date/Time: 03/24/2022/2:58:07 PM    Final     CT ANGIO HEAD NECK W WO CM (CODE STROKE)   Result Date: 03/23/2022 CLINICAL DATA:  Left facial droop, slurred speech EXAM: CT ANGIOGRAPHY HEAD AND NECK TECHNIQUE: Multidetector CT imaging of the head and neck was performed using the  standard protocol during bolus administration of intravenous contrast. Multiplanar CT image reconstructions and MIPs were obtained to evaluate the vascular anatomy. Carotid stenosis measurements (when applicable) are obtained utilizing NASCET criteria, using the distal internal carotid diameter as the denominator. RADIATION DOSE REDUCTION: This exam was performed according to the departmental dose-optimization program which includes automated exposure control, adjustment of the mA and/or kV according to patient size and/or use of iterative reconstruction technique. CONTRAST:  75mL OMNIPAQUE IOHEXOL 350 MG/ML SOLN COMPARISON:  No prior CTA available, correlation is made with CT head 03/23/2022 FINDINGS: CT HEAD FINDINGS For noncontrast findings, please see same day CT head. CTA NECK FINDINGS Aortic arch: Two-vessel arch with a common origin of the brachiocephalic and left common carotid arteries. Imaged portion shows no evidence of aneurysm or dissection. No significant stenosis of the major arch vessel origins. Right carotid system: No evidence of dissection, occlusion, or hemodynamically significant stenosis (greater than 50%). Left carotid  system: No evidence of dissection, occlusion, or hemodynamically significant stenosis (greater than 50%). Vertebral arteries: No evidence of dissection, occlusion, or hemodynamically significant stenosis (greater than 50%). Skeleton: No acute osseous abnormality. Other neck: No acute finding. Upper chest: No focal pulmonary opacity or pleural effusion. Review of the MIP images confirms the above findings CTA HEAD FINDINGS Anterior circulation: Both internal carotid arteries are patent to the termini, without significant stenosis. Patent left A1. Aplastic right A1. Normal anterior communicating artery. Anterior cerebral arteries are patent to their distal aspects. No M1 stenosis or occlusion. A right M2 appears occluded, just proximal to the bifurcation (series 7, image [id number]),  with nonopacification of the more inferior M3 branch and opacification of the more superior branch. MCA branches otherwise perfused and symmetric. Posterior circulation: Vertebral arteries patent to the vertebrobasilar junction without stenosis, although the left V4 is quite diminutive after the PICA takeoff. Posterior inferior cerebellar arteries patent proximally. Basilar patent to its distal aspect. Superior cerebellar arteries patent proximally. Patent P1 segments, diminutive on the right. Near fetal origin of the right PCA from a prominent right posterior communicating artery. The left posterior communicating artery is also patent. PCAs perfused to their distal aspects without stenosis. Venous sinuses: As permitted by contrast timing, patent. Anatomic variants: Near fetal origin of the right PCA. Review of the MIP images confirms the above findings IMPRESSION: 1. Occlusion of a distal right M2, near the bifurcation, with nonopacification of the more inferior M3 branch and opacification of the more superior branch. 2. Otherwise no intracranial hemodynamically significant stenosis. 3.  No hemodynamically significant stenosis in the neck. These findings were discussed by telephone on 03/23/2022 at 11:46 pm with provider MCNEILL KIRKPATRICK . Electronically Signed   By: Wiliam Ke M.D.   On: 03/23/2022 23:55    CT HEAD CODE STROKE WO CONTRAST   Result Date: 03/23/2022 CLINICAL DATA:  Code stroke. Left-sided weakness, facial droop, slurred speech EXAM: CT HEAD WITHOUT CONTRAST TECHNIQUE: Contiguous axial images were obtained from the base of the skull through the vertex without intravenous contrast. RADIATION DOSE REDUCTION: This exam was performed according to the departmental dose-optimization program which includes automated exposure control, adjustment of the mA and/or kV according to patient size and/or use of iterative reconstruction technique. COMPARISON:  None Available. FINDINGS: Brain: No evidence of  acute infarction, hemorrhage, cerebral edema, mass, mass effect, or midline shift. No hydrocephalus or extra-axial collection. Vascular: No hyperdense vessel. Skull: Negative for fracture or focal lesion. Sinuses/Orbits: No acute finding. Other: The mastoid air cells are well aerated. ASPECTS [facility] Stroke Program Early CT Score) - Ganglionic level infarction (caudate, lentiform nuclei, internal capsule, insula, M1-M3 cortex): 7 - Supraganglionic infarction (M4-M6 cortex): 3 Total score (0-10 with 10 being normal): 10 IMPRESSION: No evidence of acute intracranial process. ASPECTS is 10. Code stroke imaging results were communicated on 03/23/2022 at 11:33 pm to provider Dr. Amada Jupiter via secure text paging. Electronically Signed   By: Wiliam Ke M.D.   On: 03/23/2022 23:33       Assessment & Plan:   Problem List Items Addressed This Visit   None   No orders of the defined types were placed in this encounter.  Patient will receive fecal occult kit for colon cancer screening Follow-up: No follow-ups on file.    Shan Levans, MD

## 2023-02-01 NOTE — Assessment & Plan Note (Signed)
Hypertension poorly controlled will begin valsartan 320 mg daily resume the amlodipine 10 mg daily and continue bisoprolol.  Patient will return for short-term follow-up for RN blood pressure check.  Patient asked to get a pill organizer to help him improve medication management.  Labs will be obtained.

## 2023-02-01 NOTE — Assessment & Plan Note (Signed)
Controlled type 2 diabetes no changes

## 2023-02-03 ENCOUNTER — Other Ambulatory Visit: Payer: Self-pay

## 2023-02-03 NOTE — Progress Notes (Signed)
Let pt know kidney stable, no protein in urine, cholesterol at goal follow recommendations at office visit

## 2023-02-05 ENCOUNTER — Telehealth: Payer: Self-pay

## 2023-02-05 NOTE — Telephone Encounter (Signed)
Pt was called and vm was left, Information has been sent to nurse pool.   

## 2023-02-05 NOTE — Telephone Encounter (Signed)
-----   Message from Shan Levans sent at 02/03/2023  6:48 AM EDT ----- Let pt know kidney stable, no protein in urine, cholesterol at goal follow recommendations at office visit

## 2023-02-07 ENCOUNTER — Other Ambulatory Visit: Payer: Self-pay

## 2023-02-11 ENCOUNTER — Ambulatory Visit: Payer: Medicare HMO | Attending: Critical Care Medicine

## 2023-02-11 VITALS — Ht 70.0 in | Wt 239.0 lb

## 2023-02-11 DIAGNOSIS — Z0000 Encounter for general adult medical examination without abnormal findings: Secondary | ICD-10-CM | POA: Diagnosis not present

## 2023-02-11 NOTE — Progress Notes (Signed)
Subjective:   Brian Gwynn. is a 57 y.o. male who presents for Medicare Annual/Subsequent preventive examination.  Visit Complete: Virtual  I connected with  Brian Moody. on 02/11/23 by a audio enabled telemedicine application and verified that I am speaking with the correct person using two identifiers.  Patient Location: Home  Provider Location: Home Office  I discussed the limitations of evaluation and management by telemedicine. The patient expressed understanding and agreed to proceed.  Vital Signs: Because this visit was a virtual/telehealth visit, some criteria may be missing or patient reported. Any vitals not documented were not able to be obtained and vitals that have been documented are patient reported.   Review of Systems     Cardiac Risk Factors include: advanced age (>71men, >43 women);dyslipidemia;diabetes mellitus;hypertension;male gender;sedentary lifestyle     Objective:    Today's Vitals   02/11/23 1349  Weight: 239 lb (108.4 kg)  Height: 5\' 10"  (1.778 m)   Body mass index is 34.29 kg/m.     02/11/2023    1:51 PM 05/22/2022    4:24 PM 03/23/2022   11:53 PM 02/24/2021   10:08 AM 10/17/2016   11:45 AM 07/19/2016    3:32 PM 07/01/2016    1:33 PM  Advanced Directives  Does Patient Have a Medical Advance Directive? No No No No No No No  Would patient like information on creating a medical advance directive? Yes (MAU/Ambulatory/Procedural Areas - Information given)   Yes (MAU/Ambulatory/Procedural Areas - Information given)  Yes (ED - Information included in AVS)     Current Medications (verified) Outpatient Encounter Medications as of 02/11/2023  Medication Sig   acetaminophen (TYLENOL) 500 MG tablet Take 2 tablets (1,000 mg total) by mouth every 6 (six) hours as needed for headache.   amLODipine (NORVASC) 10 MG tablet Take 1 tablet (10 mg total) by mouth daily.   apixaban (ELIQUIS) 5 MG TABS tablet Take 1 tablet (5 mg total) by mouth 2 (two)  times daily.   atorvastatin (LIPITOR) 20 MG tablet Take 1 tablet (20 mg total) by mouth daily.   bisoprolol (ZEBETA) 10 MG tablet Take 2 tablets (20 mg total) by mouth daily.   blood glucose meter kit and supplies KIT Dispense based on patient and insurance preference. Use up to four times daily as directed. (FOR ICD-9 250.00, 250.01).   Blood Pressure Monitoring (5 SERIES BP MONITOR) DEVI 1 Units by Does not apply route daily. Measure blood pressure daily   clobetasol cream (TEMOVATE) 0.05 % APPLY TO AFFECTED AREA TWICE A DAY   cyclobenzaprine (FLEXERIL) 5 MG tablet Take 1 tablet (5 mg total) by mouth 3 (three) times daily as needed for muscle spasms.   diclofenac Sodium (VOLTAREN) 1 % GEL Apply 2 g topically 4 (four) times daily. To right knee   metFORMIN (GLUCOPHAGE) 500 MG tablet Take 1 tablet (500 mg total) by mouth daily with breakfast.   polyvinyl alcohol (LIQUIFILM TEARS) 1.4 % ophthalmic solution Place 1 drop into both eyes as needed for dry eyes.   tadalafil (CIALIS) 5 MG tablet Take 1 tablet (5 mg total) by mouth daily as needed for erectile dysfunction.   valsartan (DIOVAN) 320 MG tablet Take 1 tablet (320 mg total) by mouth daily.   Vitamin D, Ergocalciferol, (DRISDOL) 1.25 MG (50000 UNIT) CAPS capsule Take 1 capsule (50,000 Units total) by mouth every 7 (seven) days.   [DISCONTINUED] carvedilol (COREG) 12.5 MG tablet Take 1 tablet (12.5 mg total) by mouth 2 (  two) times daily with a meal.   No facility-administered encounter medications on file as of 02/11/2023.    Allergies (verified) Patient has no known allergies.   History: Past Medical History:  Diagnosis Date   Avulsion fracture of distal fibula 06/02/2013   Right distal fibula occurred 04/30/2013    Dysuria 08/14/2020   Hordeolum externum left upper eyelid 06/29/2019   Hypertension    Persistent atrial fibrillation (HCC)    a. newly diagnosed on 05/2015 admission. started on coumadin.    Pre-diabetes    a. HgA1c 6.3  05/2015   Tobacco abuse    Past Surgical History:  Procedure Laterality Date   IR CT HEAD LTD  03/24/2022   IR PERCUTANEOUS ART THROMBECTOMY/INFUSION INTRACRANIAL INC DIAG ANGIO  03/24/2022   IR US GUIDE VASC ACCESS RIGHT  03/24/2022   RADIOLOGY WITH ANESTHESIA N/A 03/24/2022   Procedure: IR WITH ANESTHESIA;  Surgeon: Radiologist, Medication, MD;  Location: [facility] OR;  Service: Radiology;  Laterality: N/A;   Family History  Problem Relation Age of Onset   Hypertension Father    Diabetes Maternal Grandmother    Hypertension Maternal Grandmother    Diabetes Maternal Grandfather    Hypertension Maternal Grandfather    Social History   Socioeconomic History   Marital status: Married    Spouse name: Not on file   Number of children: Not on file   Years of education: Not on file   Highest education level: Not on file  Occupational History   Not on file  Tobacco Use   Smoking status: Former    Current packs/day: 0.10    Types: Cigarettes    Passive exposure: Never   Smokeless tobacco: Never  Vaping Use   Vaping status: Never Used  Substance and Sexual Activity   Alcohol use: No   Drug use: No   Sexual activity: Not Currently  Other Topics Concern   Not on file  Social History Narrative   Not on file   Social Determinants of Health   Financial Resource Strain: Low Risk  (02/11/2023)   Overall Financial Resource Strain (CARDIA)    Difficulty of Paying Living Expenses: Not hard at all  Food Insecurity: No Food Insecurity (02/11/2023)   Hunger Vital Sign    Worried About Running Out of Food in the Last Year: Never true    Ran Out of Food in the Last Year: Never true  Transportation Needs: No Transportation Needs (02/11/2023)   PRAPARE - Administrator, Civil Service (Medical): No    Lack of Transportation (Non-Medical): No  Physical Activity: Inactive (02/11/2023)   Exercise Vital Sign    Days of Exercise per Week: 0 days    Minutes of Exercise per Session: 0 min   Stress: No Stress Concern Present (02/11/2023)   [employer] of Occupational Health - Occupational Stress Questionnaire    Feeling of Stress : Only a little  Social Connections: Moderately Integrated (02/11/2023)   Social Connection and Isolation Panel [NHANES]    Frequency of Communication with Friends and Family: More than three times a week    Frequency of Social Gatherings with Friends and Family: Three times a week    Attends Religious Services: 1 to 4 times per year    Active Member of Clubs or Organizations: No    Attends Banker Meetings: Never    Marital Status: Married    Tobacco Counseling Counseling given: Not Answered   Clinical Intake:  Pre-visit preparation completed:  Yes  Pain : No/denies pain     Diabetes: Yes CBG done?: No Did pt. bring in CBG monitor from home?: No  How often do you need to have someone help you when you read instructions, pamphlets, or other written materials from your doctor or pharmacy?: 1 - Never  Interpreter Needed?: No  Information entered by :: Kandis Fantasia LPN   Activities of Daily Living    02/11/2023    1:49 PM 05/22/2022    4:25 PM  In your present state of health, do you have any difficulty performing the following activities:  Hearing? 0 0  Vision? 0 0  Difficulty concentrating or making decisions? 0 0  Walking or climbing stairs? 1 0  Dressing or bathing? 0 0  Doing errands, shopping? 0 0  Preparing Food and eating ? N N  Using the Toilet? N N  In the past six months, have you accidently leaked urine? N N  Do you have problems with loss of bowel control? N N  Managing your Medications? N N  Managing your Finances? N N  Housekeeping or managing your Housekeeping? N N    Patient Care Team: Storm Frisk, MD as PCP - General (Pulmonary Disease)  Indicate any recent Medical Services you may have received from other than [facility] providers in the past year (date may be approximate).      Assessment:   This is a routine wellness examination for [place].  Hearing/Vision screen Hearing Screening - Comments:: Denies hearing difficulties   Vision Screening - Comments:: No vision problems; will schedule routine eye exam soon    Dietary issues and exercise activities discussed:     Goals Addressed   None   Depression Screen    02/11/2023    1:50 PM 01/31/2023    3:42 PM 08/01/2022   10:27 AM 05/30/2022   10:44 AM 05/22/2022    4:25 PM 01/09/2022    2:26 PM 07/02/2021   11:31 AM  PHQ 2/9 Scores  PHQ - 2 Score 0 0 0 0 0 0 0  PHQ- 9 Score 0     2     Fall Risk    02/11/2023    1:51 PM 01/31/2023    3:42 PM 08/01/2022   10:27 AM 05/30/2022   10:44 AM 05/22/2022    4:24 PM  Fall Risk   Falls in the past year? 0 0 0 0 0  Number falls in past yr: 0 0 0 0 0  Injury with Fall? 0 0  0 0  Risk for fall due to : No Fall Risks No Fall Risks No Fall Risks No Fall Risks Medication side effect  Follow up Falls prevention discussed;Education provided;Falls evaluation completed Falls evaluation completed   Falls prevention discussed;Education provided;Falls evaluation completed    MEDICARE RISK AT HOME: Medicare Risk at Home Any stairs in or around the home?: Yes If so, are there any without handrails?: No Home free of loose throw rugs in walkways, pet beds, electrical cords, etc?: Yes Adequate lighting in your home to reduce risk of falls?: Yes Life alert?: No Use of a cane, walker or w/c?: Yes Grab bars in the bathroom?: Yes Shower chair or bench in shower?: No Elevated toilet seat or a handicapped toilet?: No  TIMED UP AND GO:  Was the test performed?  No    Cognitive Function:        02/11/2023    1:52 PM 05/22/2022    4:25 PM 02/24/2021  10:05 AM  6CIT Screen  What Year? 0 points 0 points 0 points  What month? 0 points 0 points 0 points  What time? 0 points 3 points 0 points  Count back from 20 0 points 0 points 0 points  Months in reverse 2 points 4 points 0  points  Repeat phrase 2 points 4 points   Total Score 4 points 11 points     Immunizations Immunization History  Administered Date(s) Administered   Moderna Covid-19 Vaccine Bivalent Booster 88yrs & up 01/14/2022    TDAP status: Due, Education has been provided regarding the importance of this vaccine. Advised may receive this vaccine at local pharmacy or Health Dept. Aware to provide a copy of the vaccination record if obtained from local pharmacy or Health Dept. Verbalized acceptance and understanding.  Flu Vaccine status: Declined, Education has been provided regarding the importance of this vaccine but patient still declined. Advised may receive this vaccine at local pharmacy or Health Dept. Aware to provide a copy of the vaccination record if obtained from local pharmacy or Health Dept. Verbalized acceptance and understanding.  Pneumococcal vaccine status: Declined,  Education has been provided regarding the importance of this vaccine but patient still declined. Advised may receive this vaccine at local pharmacy or Health Dept. Aware to provide a copy of the vaccination record if obtained from local pharmacy or Health Dept. Verbalized acceptance and understanding.   Covid-19 vaccine status: Declined, Education has been provided regarding the importance of this vaccine but patient still declined. Advised may receive this vaccine at local pharmacy or Health Dept.or vaccine clinic. Aware to provide a copy of the vaccination record if obtained from local pharmacy or Health Dept. Verbalized acceptance and understanding.  Qualifies for Shingles Vaccine? Yes   Zostavax completed No   Shingrix Completed?: No.    Education has been provided regarding the importance of this vaccine. Patient has been advised to call insurance company to determine out of pocket expense if they have not yet received this vaccine. Advised may also receive vaccine at local pharmacy or Health Dept. Verbalized acceptance  and understanding.  Screening Tests Health Maintenance  Topic Date Due   OPHTHALMOLOGY EXAM  Never done   DTaP/Tdap/Td (1 - Tdap) Never done   COLON CANCER SCREENING ANNUAL FOBT  Never done   Zoster Vaccines- Shingrix (1 of 2) Never done   INFLUENZA VACCINE  04/08/2023 (Originally 01/23/2023)   FOOT EXAM  05/31/2023   HEMOGLOBIN A1C  08/03/2023   Diabetic kidney evaluation - eGFR measurement  01/31/2024   Diabetic kidney evaluation - Urine ACR  01/31/2024   Medicare Annual Wellness (AWV)  02/11/2024   HIV Screening  Completed   HPV VACCINES  Aged Out   Colonoscopy  Discontinued   COVID-19 Vaccine  Discontinued   Hepatitis C Screening  Discontinued    Health Maintenance  Health Maintenance Due  Topic Date Due   OPHTHALMOLOGY EXAM  Never done   DTaP/Tdap/Td (1 - Tdap) Never done   COLON CANCER SCREENING ANNUAL FOBT  Never done   Zoster Vaccines- Shingrix (1 of 2) Never done    Colorectal cancer screening:  Declines at this time   Lung Cancer Screening: (Low Dose CT Chest recommended if Age 54-80 years, 20 pack-year currently smoking OR have quit w/in 15years.) does not qualify.   Lung Cancer Screening Referral: n/a  Additional Screening:  Hepatitis C Screening: does qualify; Declines at this time   Vision Screening: Recommended annual ophthalmology exams for  early detection of glaucoma and other disorders of the eye. Is the patient up to date with their annual eye exam?  No  Who is the provider or what is the name of the office in which the patient attends annual eye exams? none If pt is not established with a provider, would they like to be referred to a provider to establish care? No .   Dental Screening: Recommended annual dental exams for proper oral hygiene  Diabetic Foot Exam: Diabetic Foot Exam: Completed 05/30/22  Community Resource Referral / Chronic Care Management: CRR required this visit?  No   CCM required this visit?  No     Plan:     I have  personally reviewed and noted the following in the patient's chart:   Medical and social history Use of alcohol, tobacco or illicit drugs  Current medications and supplements including opioid prescriptions. Patient is not currently taking opioid prescriptions. Functional ability and status Nutritional status Physical activity Advanced directives List of other physicians Hospitalizations, surgeries, and ER visits in previous 12 months Vitals Screenings to include cognitive, depression, and falls Referrals and appointments  In addition, I have reviewed and discussed with patient certain preventive protocols, quality metrics, and best practice recommendations. A written personalized care plan for preventive services as well as general preventive health recommendations were provided to patient.     Kandis Fantasia [place], [place]   [id number]   After Visit Summary: (Mail) Due to this being a telephonic visit, the after visit summary with patients personalized plan was offered to patient via mail   Nurse Notes: No concerns at this time

## 2023-02-11 NOTE — Patient Instructions (Signed)
Brian Moody , Thank you for taking time to come for your Medicare Wellness Visit. I appreciate your ongoing commitment to your health goals. Please review the following plan we discussed and let me know if I can assist you in the future.   Referrals/Orders/Follow-Ups/Clinician Recommendations: Aim for 30 minutes of exercise or brisk walking, 6-8 glasses of water, and 5 servings of fruits and vegetables each day.  This is a list of the screening recommended for you and due dates:  Health Maintenance  Topic Date Due   Eye exam for diabetics  Never done   DTaP/Tdap/Td vaccine (1 - Tdap) Never done   Stool Blood Test  Never done   Zoster (Shingles) Vaccine (1 of 2) Never done   Flu Shot  04/08/2023*   Complete foot exam   05/31/2023   Hemoglobin A1C  08/03/2023   Yearly kidney function blood test for diabetes  01/31/2024   Yearly kidney health urinalysis for diabetes  01/31/2024   Medicare Annual Wellness Visit  02/11/2024   HIV Screening  Completed   HPV Vaccine  Aged Out   Colon Cancer Screening  Discontinued   COVID-19 Vaccine  Discontinued   Hepatitis C Screening  Discontinued  *Topic was postponed. The date shown is not the original due date.    Advanced directives: (ACP Link)Information on Advanced Care Planning can be found at [facility] Advance Health Care Directives Advance Health Care Directives ([email or web address]   Next Medicare Annual Wellness Visit scheduled for next year: Yes  Preventive Care 40-64 Years, Male Preventive care refers to lifestyle choices and visits with your health care provider that can promote health and wellness. What does preventive care include? A yearly physical exam. This is also called an annual well check. Dental exams once or twice a year. Routine eye exams. Ask your health care provider how often you should have your eyes checked. Personal lifestyle choices, including: Daily care of your teeth and gums. Regular physical  activity. Eating a healthy diet. Avoiding tobacco and drug use. Limiting alcohol use. Practicing safe sex. Taking low-dose aspirin every day starting at age 65. What happens during an annual well check? The services and screenings done by your health care provider during your annual well check will depend on your age, overall health, lifestyle risk factors, and family history of disease. Counseling  Your health care provider may ask you questions about your: Alcohol use. Tobacco use. Drug use. Emotional well-being. Home and relationship well-being. Sexual activity. Eating habits. Work and work Astronomer. Screening  You may have the following tests or measurements: Height, weight, and BMI. Blood pressure. Lipid and cholesterol levels. These may be checked every 5 years, or more frequently if you are over 26 years old. Skin check. Lung cancer screening. You may have this screening every year starting at age 33 if you have a 30-pack-year history of smoking and currently smoke or have quit within the past 15 years. Fecal occult blood test (FOBT) of the stool. You may have this test every year starting at age 65. Flexible sigmoidoscopy or colonoscopy. You may have a sigmoidoscopy every 5 years or a colonoscopy every 10 years starting at age 40. Prostate cancer screening. Recommendations will vary depending on your family history and other risks. Hepatitis C blood test. Hepatitis B blood test. Sexually transmitted disease (STD) testing. Diabetes screening. This is done by checking your blood sugar (glucose) after you have not eaten for a while (fasting). You may have this done  every 1-3 years. Discuss your test results, treatment options, and if necessary, the need for more tests with your health care provider. Vaccines  Your health care provider may recommend certain vaccines, such as: Influenza vaccine. This is recommended every year. Tetanus, diphtheria, and acellular pertussis  (Tdap, Td) vaccine. You may need a Td booster every 10 years. Zoster vaccine. You may need this after age 2. Pneumococcal 13-valent conjugate (PCV13) vaccine. You may need this if you have certain conditions and have not been vaccinated. Pneumococcal polysaccharide (PPSV23) vaccine. You may need one or two doses if you smoke cigarettes or if you have certain conditions. Talk to your health care provider about which screenings and vaccines you need and how often you need them. This information is not intended to replace advice given to you by your health care provider. Make sure you discuss any questions you have with your health care provider. Document Released: 07/07/2015 Document Revised: 02/28/2016 Document Reviewed: 04/11/2015 Elsevier Interactive Patient Education  2017 [employer].  Fall Prevention in the Home Falls can cause injuries. They can happen to people of all ages. There are many things you can do to make your home safe and to help prevent falls. What can I do on the outside of my home? Regularly fix the edges of walkways and driveways and fix any cracks. Remove anything that might make you trip as you walk through a door, such as a raised step or threshold. Trim any bushes or trees on the path to your home. Use bright outdoor lighting. Clear any walking paths of anything that might make someone trip, such as rocks or tools. Regularly check to see if handrails are loose or broken. Make sure that both sides of any steps have handrails. Any raised decks and porches should have guardrails on the edges. Have any leaves, snow, or ice cleared regularly. Use sand or salt on walking paths during winter. Clean up any spills in your garage right away. This includes oil or grease spills. What can I do in the bathroom? Use night lights. Install grab bars by the toilet and in the tub and shower. Do not use towel bars as grab bars. Use non-skid mats or decals in the tub or shower. If  you need to sit down in the shower, use a plastic, non-slip stool. Keep the floor dry. Clean up any water that spills on the floor as soon as it happens. Remove soap buildup in the tub or shower regularly. Attach bath mats securely with double-sided non-slip rug tape. Do not have throw rugs and other things on the floor that can make you trip. What can I do in the bedroom? Use night lights. Make sure that you have a light by your bed that is easy to reach. Do not use any sheets or blankets that are too big for your bed. They should not hang down onto the floor. Have a firm chair that has side arms. You can use this for support while you get dressed. Do not have throw rugs and other things on the floor that can make you trip. What can I do in the kitchen? Clean up any spills right away. Avoid walking on wet floors. Keep items that you use a lot in easy-to-reach places. If you need to reach something above you, use a strong step stool that has a grab bar. Keep electrical cords out of the way. Do not use floor polish or wax that makes floors slippery. If you must use  wax, use non-skid floor wax. Do not have throw rugs and other things on the floor that can make you trip. What can I do with my stairs? Do not leave any items on the stairs. Make sure that there are handrails on both sides of the stairs and use them. Fix handrails that are broken or loose. Make sure that handrails are as long as the stairways. Check any carpeting to make sure that it is firmly attached to the stairs. Fix any carpet that is loose or worn. Avoid having throw rugs at the top or bottom of the stairs. If you do have throw rugs, attach them to the floor with carpet tape. Make sure that you have a light switch at the top of the stairs and the bottom of the stairs. If you do not have them, ask someone to add them for you. What else can I do to help prevent falls? Wear shoes that: Do not have high heels. Have rubber  bottoms. Are comfortable and fit you well. Are closed at the toe. Do not wear sandals. If you use a stepladder: Make sure that it is fully opened. Do not climb a closed stepladder. Make sure that both sides of the stepladder are locked into place. Ask someone to hold it for you, if possible. Clearly mark and make sure that you can see: Any grab bars or handrails. First and last steps. Where the edge of each step is. Use tools that help you move around (mobility aids) if they are needed. These include: Canes. Walkers. Scooters. Crutches. Turn on the lights when you go into a dark area. Replace any light bulbs as soon as they burn out. Set up your furniture so you have a clear path. Avoid moving your furniture around. If any of your floors are uneven, fix them. If there are any pets around you, be aware of where they are. Review your medicines with your doctor. Some medicines can make you feel dizzy. This can increase your chance of falling. Ask your doctor what other things that you can do to help prevent falls. This information is not intended to replace advice given to you by your health care provider. Make sure you discuss any questions you have with your health care provider. Document Released: 04/06/2009 Document Revised: 11/16/2015 Document Reviewed: 07/15/2014 Elsevier Interactive Patient Education  2017 [employer].

## 2023-02-12 ENCOUNTER — Other Ambulatory Visit: Payer: Self-pay

## 2023-04-01 ENCOUNTER — Other Ambulatory Visit: Payer: Self-pay | Admitting: Critical Care Medicine

## 2023-05-12 ENCOUNTER — Encounter: Payer: Self-pay | Admitting: Family Medicine

## 2023-05-12 ENCOUNTER — Ambulatory Visit: Payer: Medicare HMO | Attending: Family Medicine | Admitting: Family Medicine

## 2023-05-12 VITALS — BP 149/86 | HR 101 | Ht 70.0 in | Wt 234.4 lb

## 2023-05-12 DIAGNOSIS — Z029 Encounter for administrative examinations, unspecified: Secondary | ICD-10-CM

## 2023-05-12 DIAGNOSIS — F17210 Nicotine dependence, cigarettes, uncomplicated: Secondary | ICD-10-CM | POA: Diagnosis not present

## 2023-05-12 DIAGNOSIS — Z7984 Long term (current) use of oral hypoglycemic drugs: Secondary | ICD-10-CM

## 2023-05-12 DIAGNOSIS — I10 Essential (primary) hypertension: Secondary | ICD-10-CM | POA: Diagnosis not present

## 2023-05-12 DIAGNOSIS — E7849 Other hyperlipidemia: Secondary | ICD-10-CM

## 2023-05-12 DIAGNOSIS — E1165 Type 2 diabetes mellitus with hyperglycemia: Secondary | ICD-10-CM | POA: Diagnosis not present

## 2023-05-12 MED ORDER — AMLODIPINE BESYLATE 2.5 MG PO TABS: 2.5000 mg | ORAL_TABLET | Freq: Every day | ORAL | 1 refills | Status: DC

## 2023-05-12 NOTE — Patient Instructions (Signed)

## 2023-05-12 NOTE — Progress Notes (Unsigned)
Subjective:  Patient ID: Brian Sale., male    DOB: 1965/10/19  Age: 57 y.o. MRN: [id number]  CC: Medical Management of Chronic Issues   HPI Brian Lanius. is a 57 y.o. year old male patient of Dr. Delford Field with a history of type 2 diabetes mellitus, hypertension, atrial fibrillation, hyperlipidemia, obesity, history of cerebellar CVA.  Interval History: Discussed the use of AI scribe software for clinical note transcription with the patient, who gave verbal consent to proceed.  History of Present Illness            Past Medical History:  Diagnosis Date  . Avulsion fracture of distal fibula 06/02/2013   Right distal fibula occurred 04/30/2013   . Dysuria 08/14/2020  . Hordeolum externum left upper eyelid 06/29/2019  . Hypertension   . Persistent atrial fibrillation (HCC)    a. newly diagnosed on 05/2015 admission. started on coumadin.   . Pre-diabetes    a. HgA1c 6.3 05/2015  . Tobacco abuse     Past Surgical History:  Procedure Laterality Date  . IR CT HEAD LTD  03/24/2022  . IR PERCUTANEOUS ART THROMBECTOMY/INFUSION INTRACRANIAL INC DIAG ANGIO  03/24/2022  . IR US GUIDE VASC ACCESS RIGHT  03/24/2022  . RADIOLOGY WITH ANESTHESIA N/A 03/24/2022   Procedure: IR WITH ANESTHESIA;  Surgeon: Radiologist, Medication, MD;  Location: [facility] OR;  Service: Radiology;  Laterality: N/A;    Family History  Problem Relation Age of Onset  . Hypertension Father   . Diabetes Maternal Grandmother   . Hypertension Maternal Grandmother   . Diabetes Maternal Grandfather   . Hypertension Maternal Grandfather     Social History   Socioeconomic History  . Marital status: Married    Spouse name: Not on file  . Number of children: Not on file  . Years of education: Not on file  . Highest education level: Not on file  Occupational History  . Not on file  Tobacco Use  . Smoking status: Former    Current packs/day: 0.10    Types: Cigarettes    Passive exposure: Never  .  Smokeless tobacco: Never  Vaping Use  . Vaping status: Never Used  Substance and Sexual Activity  . Alcohol use: No  . Drug use: No  . Sexual activity: Not Currently  Other Topics Concern  . Not on file  Social History Narrative  . Not on file   Social Determinants of Health   Financial Resource Strain: Low Risk  (02/11/2023)   Overall Financial Resource Strain (CARDIA)   . Difficulty of Paying Living Expenses: Not hard at all  Food Insecurity: No Food Insecurity (02/11/2023)   Hunger Vital Sign   . Worried About Programme researcher, broadcasting/film/video in the Last Year: Never true   . Ran Out of Food in the Last Year: Never true  Transportation Needs: No Transportation Needs (02/11/2023)   PRAPARE - Transportation   . Lack of Transportation (Medical): No   . Lack of Transportation (Non-Medical): No  Physical Activity: Inactive (02/11/2023)   Exercise Vital Sign   . Days of Exercise per Week: 0 days   . Minutes of Exercise per Session: 0 min  Stress: No Stress Concern Present (02/11/2023)   [employer] of Occupational Health - Occupational Stress Questionnaire   . Feeling of Stress : Only a little  Social Connections: Moderately Integrated (02/11/2023)   Social Connection and Isolation Panel [NHANES]   . Frequency of Communication with Friends and Family: More  than three times a week   . Frequency of Social Gatherings with Friends and Family: Three times a week   . Attends Religious Services: 1 to 4 times per year   . Active Member of Clubs or Organizations: No   . Attends Banker Meetings: Never   . Marital Status: Married    No Known Allergies  Outpatient Medications Prior to Visit  Medication Sig Dispense Refill  . acetaminophen (TYLENOL) 500 MG tablet Take 2 tablets (1,000 mg total) by mouth every 6 (six) hours as needed for headache. 60 tablet 2  . amLODipine (NORVASC) 10 MG tablet Take 1 tablet (10 mg total) by mouth daily. 90 tablet 2  . apixaban (ELIQUIS) 5 MG TABS  tablet Take 1 tablet (5 mg total) by mouth 2 (two) times daily. 180 tablet 2  . clobetasol cream (TEMOVATE) 0.05 % APPLY TO AFFECTED AREA TWICE A DAY 30 g 0  . cyclobenzaprine (FLEXERIL) 5 MG tablet Take 1 tablet (5 mg total) by mouth 3 (three) times daily as needed for muscle spasms. 60 tablet 2  . diclofenac Sodium (VOLTAREN) 1 % GEL Apply 2 g topically 4 (four) times daily. To right knee 100 g 1  . tadalafil (CIALIS) 5 MG tablet Take 1 tablet (5 mg total) by mouth daily as needed for erectile dysfunction. 10 tablet 11  . valsartan (DIOVAN) 320 MG tablet Take 1 tablet (320 mg total) by mouth daily. 90 tablet 3  . atorvastatin (LIPITOR) 20 MG tablet Take 1 tablet (20 mg total) by mouth daily. (Patient not taking: Reported on 05/12/2023) 90 tablet 2  . bisoprolol (ZEBETA) 10 MG tablet Take 2 tablets (20 mg total) by mouth daily. (Patient not taking: Reported on 05/12/2023) 90 tablet 2  . blood glucose meter kit and supplies KIT Dispense based on patient and insurance preference. Use up to four times daily as directed. (FOR ICD-9 250.00, 250.01). (Patient not taking: Reported on 05/12/2023) 1 each 0  . Blood Pressure Monitoring (5 SERIES BP MONITOR) DEVI 1 Units by Does not apply route daily. Measure blood pressure daily (Patient not taking: Reported on 05/12/2023) 1 each 0  . metFORMIN (GLUCOPHAGE) 500 MG tablet Take 1 tablet (500 mg total) by mouth daily with breakfast. (Patient not taking: Reported on 05/12/2023) 90 tablet 1  . polyvinyl alcohol (LIQUIFILM TEARS) 1.4 % ophthalmic solution Place 1 drop into both eyes as needed for dry eyes. (Patient not taking: Reported on 05/12/2023) 15 mL 0  . Vitamin D, Ergocalciferol, (DRISDOL) 1.25 MG (50000 UNIT) CAPS capsule Take 1 capsule (50,000 Units total) by mouth every 7 (seven) days. (Patient not taking: Reported on 05/12/2023) 4 capsule 2   No facility-administered medications prior to visit.     ROS Review of Systems *** Objective:  BP (!)  159/109   Pulse (!) 101   Ht 5\' 10"  (1.778 m)   Wt 234 lb 6.4 oz (106.3 kg)   SpO2 100%   BMI 33.63 kg/m      05/12/2023    3:06 PM 02/11/2023    1:49 PM 01/31/2023    3:40 PM  BP/Weight  Systolic BP 159 -- 161  Diastolic BP 109 -- 109  Wt. (Lbs) 234.4 239 239  BMI 33.63 kg/m2 34.29 kg/m2 34.29 kg/m2      Physical Exam ***    Latest Ref Rng & Units 01/31/2023    4:20 PM 05/30/2022   11:24 AM 03/25/2022    2:59 AM  CMP  Glucose 70 - 99 mg/dL 71  95  782   BUN 6 - 24 mg/dL 13  12  8    Creatinine 0.76 - 1.27 mg/dL 9.56  2.13  0.86   Sodium 134 - 144 mmol/L 142  141  140   Potassium 3.5 - 5.2 mmol/L 3.7  4.4  3.4   Chloride 96 - 106 mmol/L 103  103  103   CO2 20 - 29 mmol/L 26  24  26    Calcium 8.7 - 10.2 mg/dL 9.3  9.1  8.5   Total Protein 6.0 - 8.5 g/dL 7.2  7.4    Total Bilirubin 0.0 - 1.2 mg/dL 0.4  0.5    Alkaline Phos 44 - 121 IU/L 98  95    AST 0 - 40 IU/L 23  15    ALT 0 - 44 IU/L 13  13      Lipid Panel     Component Value Date/Time   CHOL 132 01/31/2023 1620   TRIG 88 01/31/2023 1620   HDL 47 01/31/2023 1620   CHOLHDL 2.8 01/31/2023 1620   CHOLHDL 3.6 03/24/2022 0249   VLDL 8 03/24/2022 0249   LDLCALC 68 01/31/2023 1620    CBC    Component Value Date/Time   WBC 7.3 05/30/2022 1124   WBC 7.6 03/25/2022 0259   RBC 4.89 05/30/2022 1124   RBC 4.56 03/25/2022 0259   HGB 13.9 05/30/2022 1124   HCT 41.9 05/30/2022 1124   PLT 249 05/30/2022 1124   MCV 86 05/30/2022 1124   MCH 28.4 05/30/2022 1124   MCH 30.0 03/25/2022 0259   MCHC 33.2 05/30/2022 1124   MCHC 33.7 03/25/2022 0259   RDW 13.4 05/30/2022 1124   LYMPHSABS 2.3 05/30/2022 1124   MONOABS 0.8 03/23/2022 2315   EOSABS 0.1 05/30/2022 1124   BASOSABS 0.0 05/30/2022 1124    Lab Results  Component Value Date   HGBA1C 5.7 (A) 01/31/2023    Assessment & Plan:  Assessment and Plan               No orders of the defined types were placed in this encounter.   Follow-up: No  follow-ups on file.       Hoy Register, MD, FAAFP. [facility] [place], [place] [phone]   05/12/2023, 3:27 PM

## 2023-05-13 LAB — CMP14+EGFR
ALT: 12 [IU]/L (ref 0–44)
AST: 23 [IU]/L (ref 0–40)
Albumin: 4.1 g/dL (ref 3.8–4.9)
Alkaline Phosphatase: 100 [IU]/L (ref 44–121)
BUN/Creatinine Ratio: 9 (ref 9–20)
BUN: 13 mg/dL (ref 6–24)
Bilirubin Total: 0.3 mg/dL (ref 0.0–1.2)
CO2: 22 mmol/L (ref 20–29)
Calcium: 9.6 mg/dL (ref 8.7–10.2)
Chloride: 100 mmol/L (ref 96–106)
Creatinine, Ser: 1.43 mg/dL — ABNORMAL HIGH (ref 0.76–1.27)
Globulin, Total: 2.9 g/dL (ref 1.5–4.5)
Glucose: 83 mg/dL (ref 70–99)
Potassium: 4.5 mmol/L (ref 3.5–5.2)
Sodium: 141 mmol/L (ref 134–144)
Total Protein: 7 g/dL (ref 6.0–8.5)
eGFR: 57 mL/min/{1.73_m2} — ABNORMAL LOW (ref >59)

## 2023-05-13 LAB — HEMOGLOBIN A1C
Est. average glucose Bld gHb Est-mCnc: 126 mg/dL
Hgb A1c MFr Bld: 6 % — ABNORMAL HIGH (ref 4.8–5.6)

## 2023-07-30 ENCOUNTER — Other Ambulatory Visit: Payer: Self-pay | Admitting: Critical Care Medicine

## 2023-07-30 DIAGNOSIS — E1165 Type 2 diabetes mellitus with hyperglycemia: Secondary | ICD-10-CM

## 2023-07-31 NOTE — Telephone Encounter (Signed)
 Requested by interface surescripts. Medication discontinued 05/12/23.  Requested Prescriptions  Refused Prescriptions Disp Refills   metFORMIN  (GLUCOPHAGE ) 500 MG tablet [Pharmacy Med Name: METFORMIN  HCL 500 MG TABLET] 90 tablet 1    Sig: TAKE 1 TABLET BY MOUTH EVERY DAY WITH BREAKFAST     Endocrinology:  Diabetes - Biguanides Failed - 07/31/2023  9:26 AM      Failed - Cr in normal range and within 360 days    Creat  Date Value Ref Range Status  12/04/2015 1.15 0.70 - 1.33 mg/dL Final    Comment:      For patients > or = 59 years of age: The upper reference limit for Creatinine is approximately 13% higher for people identified as African-American.      Creatinine, Ser  Date Value Ref Range Status  05/12/2023 1.43 (H) 0.76 - 1.27 mg/dL Final         Failed - eGFR in normal range and within 360 days    GFR, Est African American  Date Value Ref Range Status  12/04/2015 85 >=60 mL/min Final   GFR calc Af Amer  Date Value Ref Range Status  08/14/2020 89 >59 mL/min/1.73 Final    Comment:    **In accordance with recommendations from the NKF-ASN Task force,**   [facility] is in the process of updating its eGFR calculation to the   2021 CKD-EPI creatinine equation that estimates kidney function   without a race variable.    GFR, Est Non African American  Date Value Ref Range Status  12/04/2015 74 >=60 mL/min Final   GFR, Estimated  Date Value Ref Range Status  03/25/2022 >60 >60 mL/min Final    Comment:    (NOTE) Calculated using the CKD-EPI Creatinine Equation (2021)    eGFR  Date Value Ref Range Status  05/12/2023 57 (L) >59 mL/min/1.73 Final         Failed - B12 Level in normal range and within 720 days    No results found for: VITAMINB12       Failed - CBC within normal limits and completed in the last 12 months    WBC  Date Value Ref Range Status  05/30/2022 7.3 3.4 - 10.8 x10E3/uL Final  03/25/2022 7.6 4.0 - 10.5 K/uL Final   RBC  Date Value Ref Range  Status  05/30/2022 4.89 4.14 - 5.80 x10E6/uL Final  03/25/2022 4.56 4.22 - 5.81 MIL/uL Final   Hemoglobin  Date Value Ref Range Status  05/30/2022 13.9 13.0 - 17.7 g/dL Final   Hematocrit  Date Value Ref Range Status  05/30/2022 41.9 37.5 - 51.0 % Final   MCHC  Date Value Ref Range Status  05/30/2022 33.2 31.5 - 35.7 g/dL Final  89/97/7976 66.2 30.0 - 36.0 g/dL Final   [facility]  Date Value Ref Range Status  05/30/2022 28.4 26.6 - 33.0 pg Final  03/25/2022 30.0 26.0 - 34.0 pg Final   MCV  Date Value Ref Range Status  05/30/2022 86 79 - 97 fL Final   No results found for: PLTCOUNTKUC, LABPLAT, POCPLA RDW  Date Value Ref Range Status  05/30/2022 13.4 11.6 - 15.4 % Final         Passed - HBA1C is between 0 and 7.9 and within 180 days    Hgb A1c MFr Bld  Date Value Ref Range Status  05/12/2023 6.0 (H) 4.8 - 5.6 % Final    Comment:             Prediabetes:  5.7 - 6.4          Diabetes: >6.4          Glycemic control for adults with diabetes: <7.0          Passed - Valid encounter within last 6 months    Recent Outpatient Visits           2 months ago Controlled type 2 diabetes mellitus with hyperglycemia, without long-term current use of insulin (HCC)   [facility] [place]. [facility] Delbert Clam, MD   6 months ago Essential hypertension   [facility] Of [place]. [facility] Brien Belvie BRAVO, MD   7 months ago Controlled type 2 diabetes mellitus with hyperglycemia, without long-term current use of insulin [facility])   [facility] Of [place]. [facility] Fleeta Tonia Garnette LITTIE, RPH-CPP   12 months ago Essential hypertension    [facility] Of [place]. [facility] Brien Belvie BRAVO, MD   1 year ago Controlled type 2 diabetes mellitus with hyperglycemia, without long-term current use of insulin [facility])   [facility] - A Dept Of [place]. [facility] Brien Belvie BRAVO, MD       Future Appointments             In 3 months Delbert Clam, MD [facility] - A Dept Of [place]. [facility]

## 2023-10-30 ENCOUNTER — Ambulatory Visit: Attending: Family Medicine | Admitting: Family Medicine

## 2023-10-30 ENCOUNTER — Encounter: Payer: Self-pay | Admitting: Family Medicine

## 2023-10-30 VITALS — BP 166/116 | HR 98 | Ht 70.0 in | Wt 224.2 lb

## 2023-10-30 DIAGNOSIS — I10 Essential (primary) hypertension: Secondary | ICD-10-CM

## 2023-10-30 DIAGNOSIS — Z6832 Body mass index (BMI) 32.0-32.9, adult: Secondary | ICD-10-CM

## 2023-10-30 DIAGNOSIS — E119 Type 2 diabetes mellitus without complications: Secondary | ICD-10-CM | POA: Diagnosis not present

## 2023-10-30 DIAGNOSIS — G8929 Other chronic pain: Secondary | ICD-10-CM

## 2023-10-30 DIAGNOSIS — I4891 Unspecified atrial fibrillation: Secondary | ICD-10-CM | POA: Diagnosis not present

## 2023-10-30 DIAGNOSIS — E1165 Type 2 diabetes mellitus with hyperglycemia: Secondary | ICD-10-CM | POA: Diagnosis not present

## 2023-10-30 DIAGNOSIS — M545 Low back pain, unspecified: Secondary | ICD-10-CM

## 2023-10-30 DIAGNOSIS — I482 Chronic atrial fibrillation, unspecified: Secondary | ICD-10-CM

## 2023-10-30 DIAGNOSIS — E669 Obesity, unspecified: Secondary | ICD-10-CM | POA: Diagnosis not present

## 2023-10-30 DIAGNOSIS — M62838 Other muscle spasm: Secondary | ICD-10-CM

## 2023-10-30 LAB — POCT GLYCOSYLATED HEMOGLOBIN (HGB A1C): HbA1c, POC (controlled diabetic range): 5.5 % (ref 0.0–7.0)

## 2023-10-30 MED ORDER — APIXABAN 5 MG PO TABS: 5.0000 mg | ORAL_TABLET | Freq: Two times a day (BID) | ORAL | 2 refills | Status: AC

## 2023-10-30 MED ORDER — CYCLOBENZAPRINE HCL 5 MG PO TABS: 5.0000 mg | ORAL_TABLET | Freq: Two times a day (BID) | ORAL | 2 refills | Status: DC | PRN

## 2023-10-30 MED ORDER — BISOPROLOL FUMARATE 10 MG PO TABS: 20.0000 mg | ORAL_TABLET | Freq: Every day | ORAL | 1 refills | Status: AC

## 2023-10-30 NOTE — Progress Notes (Signed)
 Subjective:  Patient ID: Brian Moody., male    DOB: [birth year]  Age: 58 y.o. MRN: [id number]  CC: Back Pain (Discuss BP medications/Lower back pain)     Discussed the use of AI scribe software for clinical note transcription with the patient, who gave verbal consent to proceed.  History of Present Illness Brian Moody. is a 58 year old male with a history of type 2 diabetes mellitus, hypertension, atrial fibrillation, hyperlipidemia, obesity, history of cerebellar CVA (no residual deficits).  who presents with concerns about medication side effects and back pain.  He experiences episodes of a racing heart and sweating, which he associates with amlodipine , leading to discontinuation of the medication.  At his last visit, 7 months ago he informed me that he had noticed some sweating and shaking associated with his antihypertensives (bisoprolol  20 mg daily, amlodipine , Diovan ) and so he discontinued them.  He had then stated amlodipine  did not produce those symptoms and so I placed him on amlodipine  for blood pressure management at his last visit.  He has not been adherent with his Eliquis  for his atrial fibrillation.  Does not follow-up with a cardiologist either.  He manages diabetes through diet, with a recent A1c of 5.4. He previously discontinued metformin  due to concerns about renal side effects experienced by a family member.  He has lower back pain since a fall last month, located slightly to the right side, sometimes radiating down his leg. Pain increases after prolonged sitting. He has lost 15 pounds over the past nine months and has not taken medication for the pain due to insurance changes.    Past Medical History:  Diagnosis Date   Avulsion fracture of distal fibula 06/02/2013   Right distal fibula occurred 04/30/2013    Dysuria 08/14/2020   Hordeolum externum left upper eyelid 06/29/2019   Hypertension    Persistent atrial fibrillation (HCC)    a. newly diagnosed  on 05/2015 admission. started on coumadin .    Pre-diabetes    a. HgA1c 6.3 05/2015   Tobacco abuse     Past Surgical History:  Procedure Laterality Date   IR CT HEAD LTD  03/24/2022   IR PERCUTANEOUS ART THROMBECTOMY/INFUSION INTRACRANIAL INC DIAG ANGIO  03/24/2022   IR US  GUIDE VASC ACCESS RIGHT  03/24/2022   RADIOLOGY WITH ANESTHESIA N/A 03/24/2022   Procedure: IR WITH ANESTHESIA;  Surgeon: Radiologist, Medication, MD;  Location: [facility] OR;  Service: Radiology;  Laterality: N/A;    Family History  Problem Relation Age of Onset   Hypertension Father    Diabetes Maternal Grandmother    Hypertension Maternal Grandmother    Diabetes Maternal Grandfather    Hypertension Maternal Grandfather     Social History   Socioeconomic History   Marital status: Married    Spouse name: Not on file   Number of children: Not on file   Years of education: Not on file   Highest education level: Not on file  Occupational History   Not on file  Tobacco Use   Smoking status: Former    Current packs/day: 0.10    Types: Cigarettes    Passive exposure: Never   Smokeless tobacco: Never  Vaping Use   Vaping status: Never Used  Substance and Sexual Activity   Alcohol  use: No   Drug use: No   Sexual activity: Not Currently  Other Topics Concern   Not on file  Social History Narrative   Not on file   Social Drivers  of Health   Financial Resource Strain: Low Risk  (02/11/2023)   Overall Financial Resource Strain (CARDIA)    Difficulty of Paying Living Expenses: Not hard at all  Food Insecurity: No Food Insecurity (02/11/2023)   Hunger Vital Sign    Worried About Running Out of Food in the Last Year: Never true    Ran Out of Food in the Last Year: Never true  Transportation Needs: No Transportation Needs (02/11/2023)   PRAPARE - Administrator, Civil Service (Medical): No    Lack of Transportation (Non-Medical): No  Physical Activity: Inactive (02/11/2023)   Exercise Vital Sign     Days of Exercise per Week: 0 days    Minutes of Exercise per Session: 0 min  Stress: No Stress Concern Present (02/11/2023)   [employer] of Occupational Health - Occupational Stress Questionnaire    Feeling of Stress : Only a little  Social Connections: Moderately Integrated (02/11/2023)   Social Connection and Isolation Panel [NHANES]    Frequency of Communication with Friends and Family: More than three times a week    Frequency of Social Gatherings with Friends and Family: Three times a week    Attends Religious Services: 1 to 4 times per year    Active Member of Clubs or Organizations: No    Attends Banker Meetings: Never    Marital Status: Married    No Known Allergies  Outpatient Medications Prior to Visit  Medication Sig Dispense Refill   acetaminophen  (TYLENOL ) 500 MG tablet Take 2 tablets (1,000 mg total) by mouth every 6 (six) hours as needed for headache. (Patient not taking: Reported on 10/30/2023) 60 tablet 2   atorvastatin  (LIPITOR) 20 MG tablet Take 1 tablet (20 mg total) by mouth daily. (Patient not taking: Reported on 10/30/2023) 90 tablet 2   blood glucose meter kit and supplies KIT Dispense based on patient and insurance preference. Use up to four times daily as directed. (FOR ICD-9 250.00, 250.01). (Patient not taking: Reported on 05/12/2023) 1 each 0   Blood Pressure Monitoring (5 SERIES BP MONITOR) DEVI 1 Units by Does not apply route daily. Measure blood pressure daily (Patient not taking: Reported on 05/12/2023) 1 each 0   clobetasol  cream (TEMOVATE ) 0.05 % APPLY TO AFFECTED AREA TWICE A DAY (Patient not taking: Reported on 10/30/2023) 30 g 0   diclofenac  Sodium (VOLTAREN ) 1 % GEL Apply 2 g topically 4 (four) times daily. To right knee (Patient not taking: Reported on 10/30/2023) 100 g 1   polyvinyl alcohol  (LIQUIFILM TEARS) 1.4 % ophthalmic solution Place 1 drop into both eyes as needed for dry eyes. (Patient not taking: Reported on 10/30/2023) 15 mL 0    tadalafil  (CIALIS ) 5 MG tablet Take 1 tablet (5 mg total) by mouth daily as needed for erectile dysfunction. (Patient not taking: Reported on 10/30/2023) 10 tablet 11   Vitamin D , Ergocalciferol , (DRISDOL ) 1.25 MG (50000 UNIT) CAPS capsule Take 1 capsule (50,000 Units total) by mouth every 7 (seven) days. (Patient not taking: Reported on 10/30/2023) 4 capsule 2   amLODipine  (NORVASC ) 2.5 MG tablet Take 1 tablet (2.5 mg total) by mouth daily. (Patient not taking: Reported on 10/30/2023) 90 tablet 1   apixaban  (ELIQUIS ) 5 MG TABS tablet Take 1 tablet (5 mg total) by mouth 2 (two) times daily. (Patient not taking: Reported on 10/30/2023) 180 tablet 2   bisoprolol  (ZEBETA ) 10 MG tablet Take 2 tablets (20 mg total) by mouth daily. (Patient not taking: Reported  on 10/30/2023) 90 tablet 2   cyclobenzaprine  (FLEXERIL ) 5 MG tablet Take 1 tablet (5 mg total) by mouth 3 (three) times daily as needed for muscle spasms. (Patient not taking: Reported on 10/30/2023) 60 tablet 2   No facility-administered medications prior to visit.     ROS Review of Systems  Constitutional:  Negative for activity change and appetite change.  HENT:  Negative for sinus pressure and sore throat.   Respiratory:  Negative for chest tightness, shortness of breath and wheezing.   Cardiovascular:  Negative for chest pain and palpitations.  Gastrointestinal:  Negative for abdominal distention, abdominal pain and constipation.  Genitourinary: Negative.   Musculoskeletal:  Positive for back pain.  Psychiatric/Behavioral:  Negative for behavioral problems and dysphoric mood.     Objective:  BP (!) 166/116   Pulse 98   Ht 5\' 10"  (1.778 m)   Wt 224 lb 3.2 oz (101.7 kg)   SpO2 99%   BMI 32.17 kg/m      10/30/2023   10:24 AM 05/12/2023    3:51 PM 05/12/2023    3:06 PM  BP/Weight  Systolic BP 166 149 159  Diastolic BP 116 86 109  Wt. (Lbs) 224.2  234.4  BMI 32.17 kg/m2  33.63 kg/m2    Wt Readings from Last 3 Encounters:  10/30/23 224  lb 3.2 oz (101.7 kg)  05/12/23 234 lb 6.4 oz (106.3 kg)  02/11/23 239 lb (108.4 kg)      Physical Exam Constitutional:      Appearance: He is well-developed.  Cardiovascular:     Rate and Rhythm: Normal rate. Rhythm irregular.     Heart sounds: Normal heart sounds. No murmur heard. Pulmonary:     Effort: Pulmonary effort is normal.     Breath sounds: Normal breath sounds. No wheezing or rales.  Chest:     Chest wall: No tenderness.  Abdominal:     General: Bowel sounds are normal. There is no distension.     Palpations: Abdomen is soft. There is no mass.     Tenderness: There is no abdominal tenderness.  Musculoskeletal:        General: Normal range of motion.     Right lower leg: No edema.     Left lower leg: No edema.     Comments: No tenderness on palpation of bilateral lumbar muscles Negative straight leg raise bilaterally  Neurological:     Mental Status: He is alert and oriented to person, place, and time.  Psychiatric:        Mood and Affect: Mood normal.        Latest Ref Rng & Units 05/12/2023    3:54 PM 01/31/2023    4:20 PM 05/30/2022   11:24 AM  CMP  Glucose 70 - 99 mg/dL 83  71  95   BUN 6 - 24 mg/dL 13  13  12    Creatinine 0.76 - 1.27 mg/dL 1.47  8.29  5.62   Sodium 134 - 144 mmol/L 141  142  141   Potassium 3.5 - 5.2 mmol/L 4.5  3.7  4.4   Chloride 96 - 106 mmol/L 100  103  103   CO2 20 - 29 mmol/L 22  26  24    Calcium  8.7 - 10.2 mg/dL 9.6  9.3  9.1   Total Protein 6.0 - 8.5 g/dL 7.0  7.2  7.4   Total Bilirubin 0.0 - 1.2 mg/dL 0.3  0.4  0.5   Alkaline Phos 44 -  121 IU/L 100  98  95   AST 0 - 40 IU/L 23  23  15    ALT 0 - 44 IU/L 12  13  13      Lipid Panel     Component Value Date/Time   CHOL 132 01/31/2023 1620   TRIG 88 01/31/2023 1620   HDL 47 01/31/2023 1620   CHOLHDL 2.8 01/31/2023 1620   CHOLHDL 3.6 03/24/2022 0249   VLDL 8 03/24/2022 0249   LDLCALC 68 01/31/2023 1620    CBC    Component Value Date/Time   WBC 7.3 05/30/2022 1124    WBC 7.6 03/25/2022 0259   RBC 4.89 05/30/2022 1124   RBC 4.56 03/25/2022 0259   HGB 13.9 05/30/2022 1124   HCT 41.9 05/30/2022 1124   PLT 249 05/30/2022 1124   MCV 86 05/30/2022 1124   MCH 28.4 05/30/2022 1124   MCH 30.0 03/25/2022 0259   MCHC 33.2 05/30/2022 1124   MCHC 33.7 03/25/2022 0259   RDW 13.4 05/30/2022 1124   LYMPHSABS 2.3 05/30/2022 1124   MONOABS 0.8 03/23/2022 2315   EOSABS 0.1 05/30/2022 1124   BASOSABS 0.0 05/30/2022 1124    Lab Results  Component Value Date   HGBA1C 5.5 10/30/2023    Lab Results  Component Value Date   TSH 2.678 06/22/2015       Assessment & Plan Hypertension Hypertension management complicated by adverse reactions to amlodipine . Bisoprolol  preferred for rate control and blood pressure management due to history of A-fib. Discussed potential atrial fibrillation contribution to symptoms. - Discontinue amlodipine . - Restart bisoprolol  10 mg, two tablets daily. - Monitor blood pressure at home. - Reassess blood pressure next visit in 1 month and adjust regimen as indicated  Atrial fibrillation Persistent atrial fibrillation with palpitations.  - He has been experiencing A-fib symptoms which he attributes to adverse effects from his antihypertensives - Emphasized importance of rate contro and anticoagulation l to prevent stroke. - Restart bisoprolol  10 mg, two tablets daily for rate control. - Restart apixaban  (Eliquis ) for anticoagulation.  Type 2 diabetes mellitus Well-controlled with diet, A1c 5.4%.  -Diet control, no medication required. Discussed concerns about metformin  and kidney function. -Counseled on Diabetic diet, my plate method, 161 minutes of moderate intensity exercise/week Blood sugar logs with fasting goals of 80-120 mg/dl, random of less than 096 and in the event of sugars less than 60 mg/dl or greater than 045 mg/dl encouraged to notify the clinic. Advised on the need for annual eye exams, annual foot exams,  Pneumonia vaccine.   Obesity Lost 15 pounds over nine months, indicating progress in weight management.  Back pain Chronic right-sided back pain, possibly due to muscle spasm.  -Review of his chart indicates he has had this for greater than the duration he stated in his history. - Increased urination possibly related to prostate enlargement or fluid intake. - Prescribe cyclobenzaprine  (Flexeril ) as needed for muscle relaxation. - Apply heat to the affected area. - Would love to order a urinalysis to exclude UTI but he is unable to void - Encourage increased water intake and reduction of soda consumption.       Meds ordered this encounter  Medications   apixaban  (ELIQUIS ) 5 MG TABS tablet    Sig: Take 1 tablet (5 mg total) by mouth 2 (two) times daily.    Dispense:  180 tablet    Refill:  2   bisoprolol  (ZEBETA ) 10 MG tablet    Sig: Take 2 tablets (20 mg total)  by mouth daily.    Dispense:  180 tablet    Refill:  1   cyclobenzaprine  (FLEXERIL ) 5 MG tablet    Sig: Take 1 tablet (5 mg total) by mouth 2 (two) times daily as needed for muscle spasms.    Dispense:  60 tablet    Refill:  2    Follow-up: Return in about 1 month (around 11/30/2023) for Blood Pressure follow-up with PCP.       Joaquin Mulberry, MD, FAAFP. [facility] [place], [place] [phone]   10/30/2023, 2:03 PM

## 2023-10-30 NOTE — Patient Instructions (Signed)
 VISIT SUMMARY:  Today, we discussed your concerns about medication side effects and back pain. We reviewed your current medications and made some adjustments to better manage your conditions. We also addressed your diabetes management and recent weight loss.  YOUR PLAN:  -HYPERTENSION: Hypertension, or high blood pressure, can lead to serious health problems if not managed properly. We have discontinued amlodipine  due to side effects and restarted bisoprolol  at 10 mg, two tablets daily. Please monitor your blood pressure at home regularly.  -ATRIAL FIBRILLATION: Atrial fibrillation is an irregular and often rapid heart rate that can increase your risk of stroke. We have restarted bisoprolol  at 10 mg, two tablets daily for rate control and you should continue taking Eliquis  for anticoagulation.  -TYPE 2 DIABETES MELLITUS: Type 2 diabetes is a condition that affects the way your body processes blood sugar. Your diabetes is well-controlled with diet, as indicated by your recent A1c of 5.4%. No medication is required at this time.  -OBESITY: Obesity is a condition characterized by excessive body fat. You have made good progress by losing 15 pounds over the past nine months. Keep up the good work with your weight management efforts.  -BACK PAIN: Your back pain may be due to muscle spasm or other issues. We have prescribed cyclobenzaprine  (Flexeril ) to help with muscle relaxation and recommend applying heat to the affected area. We will also conduct a urine test to rule out any infection. Please increase your water intake and reduce soda consumption.  INSTRUCTIONS:  Please monitor your blood pressure at home and keep a log of your readings. Continue taking your medications as prescribed. Apply heat to your back as needed and take cyclobenzaprine  for muscle relaxation. We will follow up with a urine test to rule out any infection. Keep up with your diet to manage your diabetes and continue your efforts in  weight loss. If you experience any new symptoms or have concerns, please contact our office.

## 2023-10-31 LAB — CMP14+EGFR
ALT: 18 IU/L (ref 0–44)
AST: 21 IU/L (ref 0–40)
Albumin: 4 g/dL (ref 3.8–4.9)
Alkaline Phosphatase: 108 IU/L (ref 44–121)
BUN/Creatinine Ratio: 13 (ref 9–20)
BUN: 16 mg/dL (ref 6–24)
Bilirubin Total: 0.4 mg/dL (ref 0.0–1.2)
CO2: 22 mmol/L (ref 20–29)
Calcium: 9.2 mg/dL (ref 8.7–10.2)
Chloride: 102 mmol/L (ref 96–106)
Creatinine, Ser: 1.21 mg/dL (ref 0.76–1.27)
Globulin, Total: 3.4 g/dL (ref 1.5–4.5)
Glucose: 88 mg/dL (ref 70–99)
Potassium: 4.9 mmol/L (ref 3.5–5.2)
Sodium: 142 mmol/L (ref 134–144)
Total Protein: 7.4 g/dL (ref 6.0–8.5)
eGFR: 69 mL/min/{1.73_m2} (ref >59)

## 2023-10-31 LAB — LP+NON-HDL CHOLESTEROL
Cholesterol, Total: 155 mg/dL (ref 100–199)
HDL: 53 mg/dL (ref >39)
LDL Chol Calc (NIH): 90 mg/dL (ref 0–99)
Total Non-HDL-Chol (LDL+VLDL): 102 mg/dL (ref 0–129)
Triglycerides: 57 mg/dL (ref 0–149)
VLDL Cholesterol Cal: 12 mg/dL (ref 5–40)

## 2023-11-04 ENCOUNTER — Ambulatory Visit: Payer: Self-pay

## 2023-11-12 ENCOUNTER — Other Ambulatory Visit: Payer: Self-pay | Admitting: Family Medicine

## 2023-11-12 ENCOUNTER — Ambulatory Visit: Payer: Medicare HMO | Admitting: Family Medicine

## 2023-12-12 ENCOUNTER — Telehealth: Payer: Self-pay | Admitting: Family Medicine

## 2023-12-12 NOTE — Telephone Encounter (Signed)
 Late entry: contacted pt left vm to confirmed appt!

## 2023-12-12 NOTE — Telephone Encounter (Signed)
 Pt confirmed appt (text yes)

## 2023-12-12 NOTE — Telephone Encounter (Signed)
 error

## 2023-12-15 ENCOUNTER — Other Ambulatory Visit: Payer: Self-pay

## 2023-12-15 ENCOUNTER — Ambulatory Visit: Attending: Family Medicine | Admitting: Family Medicine

## 2023-12-15 ENCOUNTER — Encounter: Payer: Self-pay | Admitting: Family Medicine

## 2023-12-15 VITALS — BP 146/92 | HR 105 | Ht 70.0 in | Wt 226.6 lb

## 2023-12-15 DIAGNOSIS — Z125 Encounter for screening for malignant neoplasm of prostate: Secondary | ICD-10-CM

## 2023-12-15 DIAGNOSIS — E559 Vitamin D deficiency, unspecified: Secondary | ICD-10-CM | POA: Diagnosis not present

## 2023-12-15 DIAGNOSIS — R5383 Other fatigue: Secondary | ICD-10-CM

## 2023-12-15 DIAGNOSIS — E119 Type 2 diabetes mellitus without complications: Secondary | ICD-10-CM | POA: Diagnosis not present

## 2023-12-15 DIAGNOSIS — I10 Essential (primary) hypertension: Secondary | ICD-10-CM | POA: Diagnosis not present

## 2023-12-15 DIAGNOSIS — G4709 Other insomnia: Secondary | ICD-10-CM

## 2023-12-15 DIAGNOSIS — Z9189 Other specified personal risk factors, not elsewhere classified: Secondary | ICD-10-CM

## 2023-12-15 DIAGNOSIS — E1165 Type 2 diabetes mellitus with hyperglycemia: Secondary | ICD-10-CM

## 2023-12-15 DIAGNOSIS — E1159 Type 2 diabetes mellitus with other circulatory complications: Secondary | ICD-10-CM

## 2023-12-15 NOTE — Progress Notes (Signed)
 Subjective:  Patient ID: Brian DELENA Doyle Mickey., male    DOB: [birth year]  Age: 58 y.o. MRN: [id number]  CC: Hypertension     Discussed the use of AI scribe software for clinical note transcription with the patient, who gave verbal consent to proceed.  History of Present Illness Brian Ibarra. is a 58 year old male with  a history of type 2 diabetes mellitus, hypertension, atrial fibrillation, hyperlipidemia, obesity, history of cerebellar CVA (no residual deficits)  who presents with fatigue and medication management.  He experiences intermittent fatigue and sluggishness, feeling 'down' and needing something to 'pick me up.' He has difficulty staying asleep, waking up after an hour or two and unable to return to sleep. No current issues with sleep apnea, which he previously experienced when he was heavier.  He is currently taking bisoprolol  for hypertension. Previous side effects of sweating and shaking have resolved which he had complained of with his previous antihypertensives. He did not take his medication this morning due to forgetting it at his mother's house.  He has a history of stroke and atrial fibrillation. He is concerned about his testosterone  levels; he does have a previous normal testosterone  level from 2020 and a low vitamin D  level at that time.  He also mentions a past incident of passing out twice, contributing to his current disability status he states he is partial and informs me that Dr. Brien was trying to obtain full disability for him.      Past Medical History:  Diagnosis Date   Avulsion fracture of distal fibula 06/02/2013   Right distal fibula occurred 04/30/2013    Dysuria 08/14/2020   Hordeolum externum left upper eyelid 06/29/2019   Hypertension    Persistent atrial fibrillation (HCC)    a. newly diagnosed on 05/2015 admission. started on coumadin .    Pre-diabetes    a. HgA1c 6.3 05/2015   Tobacco abuse     Past Surgical History:  Procedure  Laterality Date   IR CT HEAD LTD  03/24/2022   IR PERCUTANEOUS ART THROMBECTOMY/INFUSION INTRACRANIAL INC DIAG ANGIO  03/24/2022   IR US  GUIDE VASC ACCESS RIGHT  03/24/2022   RADIOLOGY WITH ANESTHESIA N/A 03/24/2022   Procedure: IR WITH ANESTHESIA;  Surgeon: Radiologist, Medication, MD;  Location: [facility] OR;  Service: Radiology;  Laterality: N/A;    Family History  Problem Relation Age of Onset   Hypertension Father    Diabetes Maternal Grandmother    Hypertension Maternal Grandmother    Diabetes Maternal Grandfather    Hypertension Maternal Grandfather     Social History   Socioeconomic History   Marital status: Married    Spouse name: Not on file   Number of children: Not on file   Years of education: Not on file   Highest education level: Not on file  Occupational History   Not on file  Tobacco Use   Smoking status: Former    Current packs/day: 0.10    Types: Cigarettes    Passive exposure: Never   Smokeless tobacco: Never  Vaping Use   Vaping status: Never Used  Substance and Sexual Activity   Alcohol  use: No   Drug use: No   Sexual activity: Not Currently  Other Topics Concern   Not on file  Social History Narrative   Not on file   Social Drivers of Health   Financial Resource Strain: Low Risk  (02/11/2023)   Overall Financial Resource Strain (CARDIA)    Difficulty of Paying  Living Expenses: Not hard at all  Food Insecurity: No Food Insecurity (02/11/2023)   Hunger Vital Sign    Worried About Running Out of Food in the Last Year: Never true    Ran Out of Food in the Last Year: Never true  Transportation Needs: No Transportation Needs (02/11/2023)   PRAPARE - Administrator, Civil Service (Medical): No    Lack of Transportation (Non-Medical): No  Physical Activity: Inactive (02/11/2023)   Exercise Vital Sign    Days of Exercise per Week: 0 days    Minutes of Exercise per Session: 0 min  Stress: No Stress Concern Present (02/11/2023)   [employer]  of Occupational Health - Occupational Stress Questionnaire    Feeling of Stress : Only a little  Social Connections: Moderately Integrated (02/11/2023)   Social Connection and Isolation Panel    Frequency of Communication with Friends and Family: More than three times a week    Frequency of Social Gatherings with Friends and Family: Three times a week    Attends Religious Services: 1 to 4 times per year    Active Member of Clubs or Organizations: No    Attends Banker Meetings: Never    Marital Status: Married    No Known Allergies  Outpatient Medications Prior to Visit  Medication Sig Dispense Refill   apixaban  (ELIQUIS ) 5 MG TABS tablet Take 1 tablet (5 mg total) by mouth 2 (two) times daily. 180 tablet 2   bisoprolol  (ZEBETA ) 10 MG tablet Take 2 tablets (20 mg total) by mouth daily. 180 tablet 1   cyclobenzaprine  (FLEXERIL ) 5 MG tablet Take 1 tablet (5 mg total) by mouth 2 (two) times daily as needed for muscle spasms. 60 tablet 2   acetaminophen  (TYLENOL ) 500 MG tablet Take 2 tablets (1,000 mg total) by mouth every 6 (six) hours as needed for headache. (Patient not taking: Reported on 10/30/2023) 60 tablet 2   atorvastatin  (LIPITOR) 20 MG tablet Take 1 tablet (20 mg total) by mouth daily. (Patient not taking: Reported on 10/30/2023) 90 tablet 2   blood glucose meter kit and supplies KIT Dispense based on patient and insurance preference. Use up to four times daily as directed. (FOR ICD-9 250.00, 250.01). (Patient not taking: Reported on 05/12/2023) 1 each 0   Blood Pressure Monitoring (5 SERIES BP MONITOR) DEVI 1 Units by Does not apply route daily. Measure blood pressure daily (Patient not taking: Reported on 05/12/2023) 1 each 0   clobetasol  cream (TEMOVATE ) 0.05 % APPLY TO AFFECTED AREA TWICE A DAY (Patient not taking: Reported on 10/30/2023) 30 g 0   diclofenac  Sodium (VOLTAREN ) 1 % GEL Apply 2 g topically 4 (four) times daily. To right knee (Patient not taking: Reported on  10/30/2023) 100 g 1   polyvinyl alcohol  (LIQUIFILM TEARS) 1.4 % ophthalmic solution Place 1 drop into both eyes as needed for dry eyes. (Patient not taking: Reported on 10/30/2023) 15 mL 0   tadalafil  (CIALIS ) 5 MG tablet Take 1 tablet (5 mg total) by mouth daily as needed for erectile dysfunction. (Patient not taking: Reported on 10/30/2023) 10 tablet 11   Vitamin D , Ergocalciferol , (DRISDOL ) 1.25 MG (50000 UNIT) CAPS capsule Take 1 capsule (50,000 Units total) by mouth every 7 (seven) days. (Patient not taking: Reported on 10/30/2023) 4 capsule 2   No facility-administered medications prior to visit.     ROS Review of Systems  Constitutional:  Negative for activity change and appetite change.  HENT:  Negative  for sinus pressure and sore throat.   Respiratory:  Negative for chest tightness, shortness of breath and wheezing.   Cardiovascular:  Negative for chest pain and palpitations.  Gastrointestinal:  Negative for abdominal distention, abdominal pain and constipation.  Genitourinary: Negative.   Musculoskeletal: Negative.   Psychiatric/Behavioral:  Negative for behavioral problems and dysphoric mood.     Objective:  BP (!) 146/92   Pulse (!) 105   Ht 5' 10 (1.778 m)   Wt 226 lb 9.6 oz (102.8 kg)   SpO2 100%   BMI 32.51 kg/m      12/15/2023   10:41 AM 12/15/2023   10:05 AM 10/30/2023   10:24 AM  BP/Weight  Systolic BP 146 148 166  Diastolic BP 92 93 116  Wt. (Lbs)  226.6 224.2  BMI  32.51 kg/m2 32.17 kg/m2      Physical Exam Constitutional:      Appearance: He is well-developed.   Cardiovascular:     Rate and Rhythm: Normal rate. Rhythm irregular.     Heart sounds: Normal heart sounds. No murmur heard. Pulmonary:     Effort: Pulmonary effort is normal.     Breath sounds: Normal breath sounds. No wheezing or rales.  Chest:     Chest wall: No tenderness.  Abdominal:     General: Bowel sounds are normal. There is no distension.     Palpations: Abdomen is soft. There is  no mass.     Tenderness: There is no abdominal tenderness.   Musculoskeletal:        General: Normal range of motion.     Right lower leg: No edema.     Left lower leg: No edema.   Neurological:     Mental Status: He is alert and oriented to person, place, and time.   Psychiatric:        Mood and Affect: Mood normal.        Latest Ref Rng & Units 10/30/2023   11:20 AM 05/12/2023    3:54 PM 01/31/2023    4:20 PM  CMP  Glucose 70 - 99 mg/dL 88  83  71   BUN 6 - 24 mg/dL 16  13  13    Creatinine 0.76 - 1.27 mg/dL 8.78  8.56  8.61   Sodium 134 - 144 mmol/L 142  141  142   Potassium 3.5 - 5.2 mmol/L 4.9  4.5  3.7   Chloride 96 - 106 mmol/L 102  100  103   CO2 20 - 29 mmol/L 22  22  26    Calcium  8.7 - 10.2 mg/dL 9.2  9.6  9.3   Total Protein 6.0 - 8.5 g/dL 7.4  7.0  7.2   Total Bilirubin 0.0 - 1.2 mg/dL 0.4  0.3  0.4   Alkaline Phos 44 - 121 IU/L 108  100  98   AST 0 - 40 IU/L 21  23  23    ALT 0 - 44 IU/L 18  12  13      Lipid Panel     Component Value Date/Time   CHOL 155 10/30/2023 1120   TRIG 57 10/30/2023 1120   HDL 53 10/30/2023 1120   CHOLHDL 2.8 01/31/2023 1620   CHOLHDL 3.6 03/24/2022 0249   VLDL 8 03/24/2022 0249   LDLCALC 90 10/30/2023 1120    CBC    Component Value Date/Time   WBC 7.3 05/30/2022 1124   WBC 7.6 03/25/2022 0259   RBC 4.89 05/30/2022 1124   RBC  4.56 03/25/2022 0259   HGB 13.9 05/30/2022 1124   HCT 41.9 05/30/2022 1124   PLT 249 05/30/2022 1124   MCV 86 05/30/2022 1124   MCH 28.4 05/30/2022 1124   MCH 30.0 03/25/2022 0259   MCHC 33.2 05/30/2022 1124   MCHC 33.7 03/25/2022 0259   RDW 13.4 05/30/2022 1124   LYMPHSABS 2.3 05/30/2022 1124   MONOABS 0.8 03/23/2022 2315   EOSABS 0.1 05/30/2022 1124   BASOSABS 0.0 05/30/2022 1124    Lab Results  Component Value Date   HGBA1C 5.5 10/30/2023    Lab Results  Component Value Date   TSH 2.678 06/22/2015       Assessment & Plan Hypertension Blood pressure elevated at 148/93 mmHg due  to missed bisoprolol  dose. Previous side effects resolved. - Continue bisoprolol  as prescribed. -Counseled on blood pressure goal of less than 130/80, low-sodium, DASH diet, medication compliance, 150 minutes of moderate intensity exercise per week. Discussed medication compliance, adverse effects.   Fatigue Intermittent fatigue possibly due to low testosterone , vitamin D  deficiency, thyroid dysfunction, or anemia. Declined sleep study. - Order blood tests for testosterone , vitamin D , thyroid function, and complete blood count. - Evaluate results for further management.  Insomnia Symptoms suggestive of sleep apnea. Declined sleep study. Discussed potential impact of untreated sleep apnea. - Educated on importance of diagnosing and treating sleep apnea.  Need for dental care/general healthcare maintenance/prostate cancer screening Requires routine dental and ophthalmologic evaluations.   Refer to dentist for routine dental evaluation. - Order PSA test for prostate cancer screening.   Type 2 diabetes mellitus -Diet controlled - Refer to ophthalmologist for eye examination.   No orders of the defined types were placed in this encounter.   Follow-up: Return in about 6 months (around 06/15/2024) for Chronic medical conditions.       Corrina Sabin, MD, FAAFP. [facility] [place], [place] [phone]   12/15/2023, 11:44 AM

## 2023-12-15 NOTE — Patient Instructions (Signed)
 VISIT SUMMARY:  Today, you came in to discuss your fatigue and to manage your hypertension. We reviewed your current medications and discussed your symptoms, including your sleep issues and concerns about your testosterone  levels. We also talked about general health maintenance and the importance of routine screenings.  YOUR PLAN:  -HYPERTENSION: Hypertension means high blood pressure. Your blood pressure was elevated today because you missed your dose of bisoprolol . Please continue taking bisoprolol  as prescribed to manage your blood pressure.  -FATIGUE: Fatigue can be caused by various factors such as low testosterone , vitamin D  deficiency, thyroid issues, or anemia. We will conduct blood tests to check your testosterone , vitamin D , thyroid function, and complete blood count to identify the cause of your fatigue and decide on the next steps.  -SLEEP APNEA (SUSPECTED): Sleep apnea is a condition where your breathing stops and starts during sleep. Although you declined a sleep study, it's important to diagnose and treat sleep apnea if present. Untreated sleep apnea can have significant health impacts.  -GENERAL HEALTH MAINTENANCE: Routine health maintenance is important for overall well-being. You need to have regular dental and eye exams, and we discussed the importance of prostate cancer screening for men over 50. We will refer you to a dentist and an ophthalmologist, and we will order a PSA test for prostate cancer screening.  INSTRUCTIONS:  Please continue taking your bisoprolol  as prescribed. We will conduct blood tests to check your testosterone , vitamin D , thyroid function, and complete blood count. You will be referred to a dentist for a routine dental evaluation and to an ophthalmologist for an eye examination. Additionally, we will order a PSA test for prostate cancer screening.

## 2023-12-16 ENCOUNTER — Ambulatory Visit: Payer: Self-pay | Admitting: Family Medicine

## 2023-12-16 ENCOUNTER — Other Ambulatory Visit (HOSPITAL_COMMUNITY): Payer: Self-pay

## 2023-12-16 DIAGNOSIS — E559 Vitamin D deficiency, unspecified: Secondary | ICD-10-CM

## 2023-12-16 DIAGNOSIS — R972 Elevated prostate specific antigen [PSA]: Secondary | ICD-10-CM

## 2023-12-16 MED ORDER — VITAMIN D (ERGOCALCIFEROL) 1.25 MG (50000 UNIT) PO CAPS
50000.0000 [IU] | ORAL_CAPSULE | ORAL | 1 refills | Status: AC
Start: 2023-12-16 — End: ?
  Filled 2023-12-16: qty 9, 63d supply, fill #0

## 2023-12-18 LAB — CBC WITH DIFFERENTIAL/PLATELET
Basophils Absolute: 0 10*3/uL (ref 0.0–0.2)
Basos: 0 %
EOS (ABSOLUTE): 0.1 10*3/uL (ref 0.0–0.4)
Eos: 1 %
Hematocrit: 48.2 % (ref 37.5–51.0)
Hemoglobin: 15.2 g/dL (ref 13.0–17.7)
Immature Grans (Abs): 0 10*3/uL (ref 0.0–0.1)
Immature Granulocytes: 0 %
Lymphocytes Absolute: 2 10*3/uL (ref 0.7–3.1)
Lymphs: 28 %
MCH: 29.6 pg (ref 26.6–33.0)
MCHC: 31.5 g/dL (ref 31.5–35.7)
MCV: 94 fL (ref 79–97)
Monocytes Absolute: 0.5 10*3/uL (ref 0.1–0.9)
Monocytes: 7 %
Neutrophils Absolute: 4.7 10*3/uL (ref 1.4–7.0)
Neutrophils: 64 %
Platelets: 201 10*3/uL (ref 150–450)
RBC: 5.14 x10E6/uL (ref 4.14–5.80)
RDW: 13.2 % (ref 11.6–15.4)
WBC: 7.3 10*3/uL (ref 3.4–10.8)

## 2023-12-18 LAB — T4, FREE: Free T4: 1.2 ng/dL (ref 0.82–1.77)

## 2023-12-18 LAB — TESTOSTERONE,FREE AND TOTAL
Testosterone, Free: 6.4 pg/mL — ABNORMAL LOW (ref 7.2–24.0)
Testosterone: 582 ng/dL (ref 264–916)

## 2023-12-18 LAB — TSH: TSH: 1.93 u[IU]/mL (ref 0.450–4.500)

## 2023-12-18 LAB — T3: T3, Total: 141 ng/dL (ref 71–180)

## 2023-12-18 LAB — PSA, TOTAL AND FREE
PSA, Free Pct: 4.5 %
PSA, Free: 0.77 ng/mL
Prostate Specific Ag, Serum: 17.2 ng/mL — ABNORMAL HIGH (ref 0.0–4.0)

## 2023-12-18 LAB — VITAMIN D 25 HYDROXY (VIT D DEFICIENCY, FRACTURES): Vit D, 25-Hydroxy: 16.9 ng/mL — ABNORMAL LOW (ref 30.0–100.0)

## 2023-12-25 ENCOUNTER — Other Ambulatory Visit (HOSPITAL_COMMUNITY): Payer: Self-pay

## 2024-01-24 ENCOUNTER — Other Ambulatory Visit: Payer: Self-pay | Admitting: Family Medicine

## 2024-01-24 ENCOUNTER — Other Ambulatory Visit: Payer: Self-pay | Admitting: Critical Care Medicine

## 2024-01-24 DIAGNOSIS — M545 Low back pain, unspecified: Secondary | ICD-10-CM

## 2024-01-26 NOTE — Telephone Encounter (Signed)
 Requested Prescriptions  Pending Prescriptions Disp Refills   atorvastatin  (LIPITOR) 20 MG tablet [Pharmacy Med Name: ATORVASTATIN  20 MG TABLET] 90 tablet 2    Sig: TAKE 1 TABLET BY MOUTH EVERY DAY     Cardiovascular:  Antilipid - Statins Failed - 01/26/2024  1:46 PM      Failed - Lipid Panel in normal range within the last 12 months    Cholesterol, Total  Date Value Ref Range Status  10/30/2023 155 100 - 199 mg/dL Final   LDL Chol Calc (NIH)  Date Value Ref Range Status  10/30/2023 90 0 - 99 mg/dL Final   HDL  Date Value Ref Range Status  10/30/2023 53 >39 mg/dL Final   Triglycerides  Date Value Ref Range Status  10/30/2023 57 0 - 149 mg/dL Final         Passed - Patient is not pregnant      Passed - Valid encounter within last 12 months    Recent Outpatient Visits           1 month ago Other fatigue   [facility] [place]. [facility] Delbert Clam, MD   2 months ago Controlled type 2 diabetes mellitus with hyperglycemia, without long-term current use of insulin (HCC)   [facility] Of [place]. [facility] Delbert Clam, MD   8 months ago Controlled type 2 diabetes mellitus with hyperglycemia, without long-term current use of insulin (HCC)   [facility] Dept Of [place]. [facility] Delbert Clam, MD   12 months ago Essential hypertension   [facility] [place] [facility] Dept Of [place]. [facility] Brien Belvie BRAVO, MD   1 year ago Controlled type 2 diabetes mellitus with hyperglycemia, without long-term current use of insulin [facility])   [facility] Dept Of [place]. [facility] Fleeta Morris, Garnette CROME, RPH-CPP              Refused Prescriptions Disp Refills   amLODipine  (NORVASC ) 10 MG tablet [Pharmacy Med Name: AMLODIPINE  BESYLATE 10 MG TAB] 90 tablet 2    Sig: TAKE 1 TABLET BY MOUTH EVERY DAY      Cardiovascular: Calcium  Channel Blockers 2 Failed - 01/26/2024  1:46 PM      Failed - Last BP in normal range    BP Readings from Last 1 Encounters:  12/15/23 (!) 146/92         Passed - Last Heart Rate in normal range    Pulse Readings from Last 1 Encounters:  12/15/23 (!) 105         Passed - Valid encounter within last 6 months    Recent Outpatient Visits           1 month ago Other fatigue   [facility] Of [place]. [facility] Delbert Clam, MD   2 months ago Controlled type 2 diabetes mellitus with hyperglycemia, without long-term current use of insulin (HCC)   [facility] Dept Of [place]. [facility] Delbert Clam, MD   8 months ago Controlled type 2 diabetes mellitus with hyperglycemia, without long-term current use of insulin (HCC)   [facility] Dept Of [place]. [facility] Delbert Clam, MD   12 months  ago Essential hypertension   [facility] [place] - [facility] Dept Of [place]. [facility] Brien Belvie BRAVO, MD   1 year ago Controlled type 2 diabetes mellitus with hyperglycemia, without long-term current use of insulin [facility])   [facility] Dept Of . [facility] Fleeta Tonia Garnette LITTIE, RPH-CPP

## 2024-01-26 NOTE — Telephone Encounter (Signed)
 Requested medications are due for refill today.  yes  Requested medications are on the active medications list.  yes  Last refill. 10/30/2023 #60 2 rf  Future visit scheduled.   yes  Notes to clinic.  Refill not delegated.    Requested Prescriptions  Pending Prescriptions Disp Refills   cyclobenzaprine  (FLEXERIL ) 5 MG tablet [Pharmacy Med Name: CYCLOBENZAPRINE  5 MG TABLET] 60 tablet 2    Sig: TAKE 1 TABLET BY MOUTH 2 TIMES DAILY AS NEEDED FOR MUSCLE SPASMS.     Not Delegated - Analgesics:  Muscle Relaxants Failed - 01/26/2024  1:46 PM      Failed - This refill cannot be delegated      Passed - Valid encounter within last 6 months    Recent Outpatient Visits           1 month ago Other fatigue   [facility] Of [place]. [facility] Delbert Clam, MD   2 months ago Controlled type 2 diabetes mellitus with hyperglycemia, without long-term current use of insulin (HCC)   [facility] Of [place]. [facility] Delbert Clam, MD   8 months ago Controlled type 2 diabetes mellitus with hyperglycemia, without long-term current use of insulin (HCC)   [facility] Dept Of [place]. [facility] Delbert Clam, MD   12 months ago Essential hypertension   [facility] [place] [place] - [facility] Dept Of [place]. [facility] Brien Belvie BRAVO, MD   1 year ago Controlled type 2 diabetes mellitus with hyperglycemia, without long-term current use of insulin [facility])   [facility] Dept Of [place]. [facility] Fleeta Tonia Garnette LITTIE, RPH-CPP

## 2024-02-03 ENCOUNTER — Emergency Department (HOSPITAL_BASED_OUTPATIENT_CLINIC_OR_DEPARTMENT_OTHER)

## 2024-02-03 ENCOUNTER — Other Ambulatory Visit: Payer: Self-pay

## 2024-02-03 DIAGNOSIS — R519 Headache, unspecified: Secondary | ICD-10-CM | POA: Diagnosis not present

## 2024-02-03 DIAGNOSIS — Z79899 Other long term (current) drug therapy: Secondary | ICD-10-CM | POA: Diagnosis not present

## 2024-02-03 DIAGNOSIS — R7989 Other specified abnormal findings of blood chemistry: Secondary | ICD-10-CM | POA: Insufficient documentation

## 2024-02-03 DIAGNOSIS — F17200 Nicotine dependence, unspecified, uncomplicated: Secondary | ICD-10-CM | POA: Diagnosis not present

## 2024-02-03 DIAGNOSIS — I4819 Other persistent atrial fibrillation: Secondary | ICD-10-CM | POA: Insufficient documentation

## 2024-02-03 DIAGNOSIS — Z7901 Long term (current) use of anticoagulants: Secondary | ICD-10-CM | POA: Insufficient documentation

## 2024-02-03 DIAGNOSIS — Z8673 Personal history of transient ischemic attack (TIA), and cerebral infarction without residual deficits: Secondary | ICD-10-CM | POA: Diagnosis not present

## 2024-02-03 DIAGNOSIS — I10 Essential (primary) hypertension: Secondary | ICD-10-CM | POA: Insufficient documentation

## 2024-02-03 DIAGNOSIS — R55 Syncope and collapse: Secondary | ICD-10-CM | POA: Diagnosis present

## 2024-02-03 NOTE — ED Triage Notes (Addendum)
 Left sided HA since 2000-CVA 2023-Eliquis  (afib). Speech clear. Denies vision changes. Denies CP SOB.

## 2024-02-04 ENCOUNTER — Emergency Department (HOSPITAL_BASED_OUTPATIENT_CLINIC_OR_DEPARTMENT_OTHER)
Admission: EM | Admit: 2024-02-04 | Discharge: 2024-02-04 | Disposition: A | Discharge status: Home / Self Care | Attending: Emergency Medicine | Admitting: Emergency Medicine

## 2024-02-04 ENCOUNTER — Emergency Department (HOSPITAL_BASED_OUTPATIENT_CLINIC_OR_DEPARTMENT_OTHER)

## 2024-02-04 DIAGNOSIS — Z0389 Encounter for observation for other suspected diseases and conditions ruled out: Secondary | ICD-10-CM | POA: Diagnosis not present

## 2024-02-04 DIAGNOSIS — R519 Headache, unspecified: Secondary | ICD-10-CM

## 2024-02-04 LAB — CBC WITH DIFFERENTIAL/PLATELET
Abs Immature Granulocytes: 0.01 K/uL (ref 0.00–0.07)
Basophils Absolute: 0 K/uL (ref 0.0–0.1)
Basophils Relative: 1 %
Eosinophils Absolute: 0.1 K/uL (ref 0.0–0.5)
Eosinophils Relative: 1 %
HCT: 43.1 % (ref 39.0–52.0)
Hemoglobin: 14.4 g/dL (ref 13.0–17.0)
Immature Granulocytes: 0 %
Lymphocytes Relative: 36 %
Lymphs Abs: 2.9 K/uL (ref 0.7–4.0)
MCH: 29.8 pg (ref 26.0–34.0)
MCHC: 33.4 g/dL (ref 30.0–36.0)
MCV: 89 fL (ref 80.0–100.0)
Monocytes Absolute: 0.7 K/uL (ref 0.1–1.0)
Monocytes Relative: 9 %
Neutro Abs: 4.3 K/uL (ref 1.7–7.7)
Neutrophils Relative %: 53 %
Platelets: 197 K/uL (ref 150–400)
RBC: 4.84 MIL/uL (ref 4.22–5.81)
RDW: 13.7 % (ref 11.5–15.5)
WBC: 8 K/uL (ref 4.0–10.5)
nRBC: 0 % (ref 0.0–0.2)

## 2024-02-04 LAB — BASIC METABOLIC PANEL WITH GFR
Anion gap: 12 (ref 5–15)
BUN: 13 mg/dL (ref 6–20)
CO2: 24 mmol/L (ref 22–32)
Calcium: 9.1 mg/dL (ref 8.9–10.3)
Chloride: 103 mmol/L (ref 98–111)
Creatinine, Ser: 1.43 mg/dL — ABNORMAL HIGH (ref 0.61–1.24)
GFR, Estimated: 56 mL/min — ABNORMAL LOW (ref >60)
Glucose, Bld: 94 mg/dL (ref 70–99)
Potassium: 4.1 mmol/L (ref 3.5–5.1)
Sodium: 139 mmol/L (ref 135–145)

## 2024-02-04 LAB — RESP PANEL BY RT-PCR (RSV, FLU A&B, COVID)  RVPGX2
Influenza A by PCR: NEGATIVE
Influenza B by PCR: NEGATIVE
Resp Syncytial Virus by PCR: NEGATIVE
SARS Coronavirus 2 by RT PCR: NEGATIVE

## 2024-02-04 MED ORDER — METOCLOPRAMIDE HCL 5 MG/ML IJ SOLN
10.0000 mg | Freq: Once | INTRAMUSCULAR | Status: AC
  Administered 2024-02-04 (×2): 10 mg via INTRAVENOUS
  Filled 2024-02-04: qty 2

## 2024-02-04 MED ORDER — DIPHENHYDRAMINE HCL 50 MG/ML IJ SOLN
12.5000 mg | Freq: Once | INTRAMUSCULAR | Status: AC
  Administered 2024-02-04 (×2): 12.5 mg via INTRAVENOUS
  Filled 2024-02-04: qty 1

## 2024-02-04 MED ORDER — IOHEXOL 350 MG/ML SOLN
75.0000 mL | Freq: Once | INTRAVENOUS | Status: AC | PRN
  Administered 2024-02-04 (×2): 75 mL via INTRAVENOUS

## 2024-02-04 MED ORDER — MAGNESIUM SULFATE 2 GM/50ML IV SOLN
2.0000 g | Freq: Once | INTRAVENOUS | Status: AC
  Administered 2024-02-04 (×2): 2 g via INTRAVENOUS
  Filled 2024-02-04: qty 50

## 2024-02-04 NOTE — ED Provider Notes (Signed)
 [place] EMERGENCY DEPARTMENT AT [facility] Provider Note   CSN: [id number] Arrival date & time: 02/03/24  2308     Patient presents with: Headache   Brian Moody. is a 58 y.o. male.   The history is provided by the patient.  Headache Pain location:  Frontal Radiates to:  Does not radiate Onset quality:  Gradual Timing:  Constant Progression:  Worsening Chronicity:  Recurrent Context: not straining   Relieved by:  Nothing Worsened by:  Nothing Ineffective treatments:  None tried Associated symptoms: no abdominal pain, no back pain, no blurred vision, no facial pain, no fever, no focal weakness, no nausea, no neck pain, no neck stiffness, no numbness, no paresthesias, no photophobia, no sore throat, no swollen glands, no visual change, no vomiting and no weakness   Risk factors: lifestyle not sedentary   Patient with AFIB on eliquis  with HA.  No focal neurologic symptoms, no changes in vision or speech.  No fevers, no infectious symptoms.      Past Medical History:  Diagnosis Date   Avulsion fracture of distal fibula 06/02/2013   Right distal fibula occurred 04/30/2013    Dysuria 08/14/2020   Hordeolum externum left upper eyelid 06/29/2019   Hypertension    Persistent atrial fibrillation (HCC)    a. newly diagnosed on 05/2015 admission. started on coumadin .    Pre-diabetes    a. HgA1c 6.3 05/2015   Tobacco abuse      Prior to Admission medications   Medication Sig Start Date End Date Taking? Authorizing Provider  acetaminophen  (TYLENOL ) 500 MG tablet Take 2 tablets (1,000 mg total) by mouth every 6 (six) hours as needed for headache. Patient not taking: Reported on 10/30/2023 05/05/20   Vicci Barnie NOVAK, MD  apixaban  (ELIQUIS ) 5 MG TABS tablet Take 1 tablet (5 mg total) by mouth 2 (two) times daily. 10/30/23   Newlin, Enobong, MD  atorvastatin  (LIPITOR) 20 MG tablet TAKE 1 TABLET BY MOUTH EVERY DAY 01/26/24   Brien Belvie BRAVO, MD  bisoprolol  (ZEBETA ) 10  MG tablet Take 2 tablets (20 mg total) by mouth daily. 10/30/23   Newlin, Enobong, MD  blood glucose meter kit and supplies KIT Dispense based on patient and insurance preference. Use up to four times daily as directed. (FOR ICD-9 250.00, 250.01). Patient not taking: Reported on 05/12/2023 06/29/19   Brien Belvie BRAVO, MD  Blood Pressure Monitoring (5 SERIES BP MONITOR) DEVI 1 Units by Does not apply route daily. Measure blood pressure daily Patient not taking: Reported on 05/12/2023 06/29/19   Brien Belvie BRAVO, MD  clobetasol  cream (TEMOVATE ) 0.05 % APPLY TO AFFECTED AREA TWICE A DAY Patient not taking: Reported on 10/30/2023 04/01/23   Newlin, Enobong, MD  cyclobenzaprine  (FLEXERIL ) 5 MG tablet TAKE 1 TABLET BY MOUTH 2 TIMES DAILY AS NEEDED FOR MUSCLE SPASMS. 01/26/24   Newlin, Enobong, MD  diclofenac  Sodium (VOLTAREN ) 1 % GEL Apply 2 g topically 4 (four) times daily. To right knee Patient not taking: Reported on 10/30/2023 05/30/22   Brien Belvie BRAVO, MD  polyvinyl alcohol  (LIQUIFILM TEARS) 1.4 % ophthalmic solution Place 1 drop into both eyes as needed for dry eyes. Patient not taking: Reported on 10/30/2023 03/25/22   Waddell Karna DELENA, NP  tadalafil  (CIALIS ) 5 MG tablet Take 1 tablet (5 mg total) by mouth daily as needed for erectile dysfunction. Patient not taking: Reported on 10/30/2023 01/31/23   Brien Belvie BRAVO, MD  Vitamin D , Ergocalciferol , (DRISDOL ) 1.25 MG (50000 UNIT) CAPS  capsule Take 1 capsule (50,000 Units total) by mouth every 7 (seven) days. 12/16/23   Newlin, Enobong, MD  carvedilol  (COREG ) 12.5 MG tablet Take 1 tablet (12.5 mg total) by mouth 2 (two) times daily with a meal. 03/02/20 08/14/20  Brien Belvie BRAVO, MD    Allergies: Patient has no known allergies.    Review of Systems  Constitutional:  Negative for fever.  HENT:  Negative for sore throat.   Eyes:  Negative for blurred vision and photophobia.  Gastrointestinal:  Negative for abdominal pain, nausea and vomiting.  Musculoskeletal:   Negative for back pain, neck pain and neck stiffness.  Neurological:  Positive for headaches. Negative for focal weakness, syncope, facial asymmetry, speech difficulty, weakness, numbness and paresthesias.  All other systems reviewed and are negative.   Updated Vital Signs BP (!) 173/130 (BP Location: Right Arm)   Pulse (!) 56   Temp 97.9 F (36.6 C)   Resp 18   Wt 108.9 kg   SpO2 99%   BMI 34.44 kg/m   Physical Exam Vitals and nursing note reviewed.  Constitutional:      General: He is not in acute distress.    Appearance: Normal appearance. He is well-developed. He is not diaphoretic.  HENT:     Head: Normocephalic and atraumatic.     Comments: No temporal bulge or tenderness no scalp tenderness     Nose: Nose normal.     Mouth/Throat:     Mouth: Mucous membranes are moist.     Pharynx: Oropharynx is clear.  Eyes:     Extraocular Movements: Extraocular movements intact.     Conjunctiva/sclera: Conjunctivae normal.     Pupils: Pupils are equal, round, and reactive to light.     Comments: No proptosis disk margins sharp   Cardiovascular:     Rate and Rhythm: Normal rate and regular rhythm.     Pulses: Normal pulses.     Heart sounds: Normal heart sounds.  Pulmonary:     Effort: Pulmonary effort is normal.     Breath sounds: Normal breath sounds. No wheezing or rales.  Abdominal:     General: Bowel sounds are normal.     Palpations: Abdomen is soft.     Tenderness: There is no abdominal tenderness. There is no guarding or rebound.  Musculoskeletal:        General: Normal range of motion.     Cervical back: Normal range of motion and neck supple.  Skin:    General: Skin is warm and dry.     Capillary Refill: Capillary refill takes less than 2 seconds.  Neurological:     General: No focal deficit present.     Mental Status: He is alert and oriented to person, place, and time.     Cranial Nerves: No cranial nerve deficit.     Sensory: No sensory deficit.     Motor:  No weakness.     Coordination: Coordination normal.     Gait: Gait normal.     Deep Tendon Reflexes: Reflexes normal.  Psychiatric:        Mood and Affect: Mood normal.     (all labs ordered are listed, but only abnormal results are displayed) Results for orders placed or performed during the hospital encounter of 02/04/24  Resp panel by RT-PCR (RSV, Flu A&B, Covid) Anterior Nasal Swab   Collection Time: 02/04/24 12:58 AM   Specimen: Anterior Nasal Swab  Result Value Ref Range   SARS Coronavirus 2 by  RT PCR NEGATIVE NEGATIVE   Influenza A by PCR NEGATIVE NEGATIVE   Influenza B by PCR NEGATIVE NEGATIVE   Resp Syncytial Virus by PCR NEGATIVE NEGATIVE  CBC with Differential   Collection Time: 02/04/24 12:58 AM  Result Value Ref Range   WBC 8.0 4.0 - 10.5 K/uL   RBC 4.84 4.22 - 5.81 MIL/uL   Hemoglobin 14.4 13.0 - 17.0 g/dL   HCT 56.8 60.9 - 47.9 %   MCV 89.0 80.0 - 100.0 fL   MCH 29.8 26.0 - 34.0 pg   MCHC 33.4 30.0 - 36.0 g/dL   RDW 86.2 88.4 - 84.4 %   Platelets 197 150 - 400 K/uL   nRBC 0.0 0.0 - 0.2 %   Neutrophils Relative % 53 %   Neutro Abs 4.3 1.7 - 7.7 K/uL   Lymphocytes Relative 36 %   Lymphs Abs 2.9 0.7 - 4.0 K/uL   Monocytes Relative 9 %   Monocytes Absolute 0.7 0.1 - 1.0 K/uL   Eosinophils Relative 1 %   Eosinophils Absolute 0.1 0.0 - 0.5 K/uL   Basophils Relative 1 %   Basophils Absolute 0.0 0.0 - 0.1 K/uL   Immature Granulocytes 0 %   Abs Immature Granulocytes 0.01 0.00 - 0.07 K/uL  Basic metabolic panel with GFR   Collection Time: 02/04/24  1:29 AM  Result Value Ref Range   Sodium 139 135 - 145 mmol/L   Potassium 4.1 3.5 - 5.1 mmol/L   Chloride 103 98 - 111 mmol/L   CO2 24 22 - 32 mmol/L   Glucose, Bld 94 70 - 99 mg/dL   BUN 13 6 - 20 mg/dL   Creatinine, Ser 8.56 (H) 0.61 - 1.24 mg/dL   Calcium  9.1 8.9 - 10.3 mg/dL   GFR, Estimated 56 (L) >60 mL/min   Anion gap 12 5 - 15   CT ANGIO HEAD NECK W WO CM Result Date: 02/04/2024 CLINICAL DATA:   Initial evaluation for acute syncope/presyncope. EXAM: CT ANGIOGRAPHY HEAD AND NECK WITH AND WITHOUT CONTRAST TECHNIQUE: Multidetector CT imaging of the head and neck was performed using the standard protocol during bolus administration of intravenous contrast. Multiplanar CT image reconstructions and MIPs were obtained to evaluate the vascular anatomy. Carotid stenosis measurements (when applicable) are obtained utilizing NASCET criteria, using the distal internal carotid diameter as the denominator. RADIATION DOSE REDUCTION: This exam was performed according to the departmental dose-optimization program which includes automated exposure control, adjustment of the mA and/or kV according to patient size and/or use of iterative reconstruction technique. CONTRAST:  75mL OMNIPAQUE  IOHEXOL  350 MG/ML SOLN COMPARISON:  Comparison made to CT from 02/03/2024 FINDINGS: CTA NECK FINDINGS Aortic arch: Standard branching. Imaged portion shows no evidence of aneurysm or dissection. No significant stenosis of the major arch vessel origins. Right carotid system: No evidence of dissection, stenosis (50% or greater), or occlusion. Left carotid system: No evidence of dissection, stenosis (50% or greater), or occlusion. Vertebral arteries: Both vertebral arteries arise from subclavian arteries. Right vertebral artery dominant, with a diffusely hypoplastic left vertebral artery. Vertebral arteries patent without stenosis or dissection. Skeleton: No discrete or worrisome osseous lesions. Mild multilevel cervical spondylosis for age. Other neck: No other acute finding. Upper chest: No other acute finding. Review of the MIP images confirms the above findings CTA HEAD FINDINGS Anterior circulation: Both internal carotid arteries are patent to the termini without stenosis. 2 mm outpouching extending inferiorly from the supraclinoid left ICA demonstrates a small vessel emanating from its apex,  consistent with a vascular infundibulum. Left A1  segment patent. Right A1 hypoplastic and/or absent. Normal anterior communicating artery complex. Anterior cerebral arteries patent without stenosis. No M1 stenosis or occlusion. Distal MCA branches perfused and symmetric. Posterior circulation: Both V4 segments patent without significant stenosis. Both PICA patent at their origins. Basilar diminutive but patent without stenosis. Superior cerebral arteries patent bilaterally. Left PCA supplied via the basilar. Fetal type origin of the right PCA. Both PCAs patent without stenosis. Venous sinuses: Patent allowing for timing the contrast bolus. Anatomic variants: As above.  No aneurysm. Review of the MIP images confirms the above findings IMPRESSION: Negative CTA of the head and neck. No large vessel occlusion or other emergent finding. No hemodynamically significant or correctable stenosis. Electronically Signed   By: Morene Hoard M.D.   On: 02/04/2024 03:53   CT Head Wo Contrast Result Date: 02/04/2024 CLINICAL DATA:  Left-sided headache. EXAM: CT HEAD WITHOUT CONTRAST TECHNIQUE: Contiguous axial images were obtained from the base of the skull through the vertex without intravenous contrast. RADIATION DOSE REDUCTION: This exam was performed according to the departmental dose-optimization program which includes automated exposure control, adjustment of the mA and/or kV according to patient size and/or use of iterative reconstruction technique. COMPARISON:  March 23, 2022 FINDINGS: Brain: No evidence of acute infarction, hemorrhage, hydrocephalus, extra-axial collection or mass lesion/mass effect. Vascular: No hyperdense vessel or unexpected calcification. Skull: Normal. Negative for fracture or focal lesion. Sinuses/Orbits: No acute finding. Other: None. IMPRESSION: No acute intracranial pathology. Electronically Signed   By: Suzen Dials M.D.   On: 02/04/2024 00:30     Radiology: CT ANGIO HEAD NECK W WO CM Result Date: 02/04/2024 CLINICAL  DATA:  Initial evaluation for acute syncope/presyncope. EXAM: CT ANGIOGRAPHY HEAD AND NECK WITH AND WITHOUT CONTRAST TECHNIQUE: Multidetector CT imaging of the head and neck was performed using the standard protocol during bolus administration of intravenous contrast. Multiplanar CT image reconstructions and MIPs were obtained to evaluate the vascular anatomy. Carotid stenosis measurements (when applicable) are obtained utilizing NASCET criteria, using the distal internal carotid diameter as the denominator. RADIATION DOSE REDUCTION: This exam was performed according to the departmental dose-optimization program which includes automated exposure control, adjustment of the mA and/or kV according to patient size and/or use of iterative reconstruction technique. CONTRAST:  75mL OMNIPAQUE  IOHEXOL  350 MG/ML SOLN COMPARISON:  Comparison made to CT from 02/03/2024 FINDINGS: CTA NECK FINDINGS Aortic arch: Standard branching. Imaged portion shows no evidence of aneurysm or dissection. No significant stenosis of the major arch vessel origins. Right carotid system: No evidence of dissection, stenosis (50% or greater), or occlusion. Left carotid system: No evidence of dissection, stenosis (50% or greater), or occlusion. Vertebral arteries: Both vertebral arteries arise from subclavian arteries. Right vertebral artery dominant, with a diffusely hypoplastic left vertebral artery. Vertebral arteries patent without stenosis or dissection. Skeleton: No discrete or worrisome osseous lesions. Mild multilevel cervical spondylosis for age. Other neck: No other acute finding. Upper chest: No other acute finding. Review of the MIP images confirms the above findings CTA HEAD FINDINGS Anterior circulation: Both internal carotid arteries are patent to the termini without stenosis. 2 mm outpouching extending inferiorly from the supraclinoid left ICA demonstrates a small vessel emanating from its apex, consistent with a vascular infundibulum.  Left A1 segment patent. Right A1 hypoplastic and/or absent. Normal anterior communicating artery complex. Anterior cerebral arteries patent without stenosis. No M1 stenosis or occlusion. Distal MCA branches perfused and symmetric. Posterior circulation: Both V4 segments patent  without significant stenosis. Both PICA patent at their origins. Basilar diminutive but patent without stenosis. Superior cerebral arteries patent bilaterally. Left PCA supplied via the basilar. Fetal type origin of the right PCA. Both PCAs patent without stenosis. Venous sinuses: Patent allowing for timing the contrast bolus. Anatomic variants: As above.  No aneurysm. Review of the MIP images confirms the above findings IMPRESSION: Negative CTA of the head and neck. No large vessel occlusion or other emergent finding. No hemodynamically significant or correctable stenosis. Electronically Signed   By: Morene Hoard M.D.   On: 02/04/2024 03:53   CT Head Wo Contrast Result Date: 02/04/2024 CLINICAL DATA:  Left-sided headache. EXAM: CT HEAD WITHOUT CONTRAST TECHNIQUE: Contiguous axial images were obtained from the base of the skull through the vertex without intravenous contrast. RADIATION DOSE REDUCTION: This exam was performed according to the departmental dose-optimization program which includes automated exposure control, adjustment of the mA and/or kV according to patient size and/or use of iterative reconstruction technique. COMPARISON:  March 23, 2022 FINDINGS: Brain: No evidence of acute infarction, hemorrhage, hydrocephalus, extra-axial collection or mass lesion/mass effect. Vascular: No hyperdense vessel or unexpected calcification. Skull: Normal. Negative for fracture or focal lesion. Sinuses/Orbits: No acute finding. Other: None. IMPRESSION: No acute intracranial pathology. Electronically Signed   By: Suzen Dials M.D.   On: 02/04/2024 00:30     Procedures   Medications Ordered in the ED  metoCLOPramide   (REGLAN ) injection 10 mg (10 mg Intravenous Given 02/04/24 0107)  diphenhydrAMINE  (BENADRYL ) injection 12.5 mg (12.5 mg Intravenous Given 02/04/24 0106)  magnesium  sulfate IVPB 2 g 50 mL (2 g Intravenous New Bag/Given 02/04/24 0130)  iohexol  (OMNIPAQUE ) 350 MG/ML injection 75 mL (75 mLs Intravenous Contrast Given 02/04/24 0207)                                    Medical Decision Making Patient with head and h/o stroke   Amount and/or Complexity of Data Reviewed Independent Historian: friend    Details: See above  External Data Reviewed: notes.    Details: Previous notes reviewed  Labs: ordered.    Details: Negative covid and flu.  Normal white count 8, normal hemoglobin 14.4, normal platelets.  Normal sodium and potassium slight elevation of creatinine 1.43  Radiology: ordered and independent interpretation performed.    Details: No bleed on CT Discussion of management or test interpretation with external provider(s): 302 case d/w Dr. Vanessa, if CTA is negative no indication for MRI at this time as already on eliquis .  Follow up with patient's neurologist as an outpatient   Risk Prescription drug management. Risk Details: Well appearing normal exam..  Symptoms improved.  CTA is without acute finding.  No ICH, doubt cavernous sinus thrombosis as is on a DOAC.  No signs of meningitis.  Will have patient follow up with neurology as an outpatient.  Stable for discharge.  Strict returns.      Final diagnoses:  None   No signs of systemic illness or infection. The patient is nontoxic-appearing on exam and vital signs are within normal limits.  I have reviewed the triage vital signs and the nursing notes. Pertinent labs & imaging results that were available during my care of the patient were reviewed by me and considered in my medical decision making (see chart for details). After history, exam, and medical workup I feel the patient has been appropriately medically screened and is safe  for  discharge home. Pertinent diagnoses were discussed with the patient. Patient was given return precautions.  ED Discharge Orders     None          Bunny Kleist, MD 02/04/24 [id number]

## 2024-02-12 ENCOUNTER — Other Ambulatory Visit: Payer: Self-pay | Admitting: Family Medicine

## 2024-02-17 ENCOUNTER — Ambulatory Visit: Payer: Medicare HMO | Attending: Family Medicine

## 2024-02-17 VITALS — Ht 70.0 in | Wt 240.0 lb

## 2024-02-17 DIAGNOSIS — Z0000 Encounter for general adult medical examination without abnormal findings: Secondary | ICD-10-CM | POA: Diagnosis not present

## 2024-02-17 NOTE — Progress Notes (Signed)
 Because this visit was a virtual/telehealth visit,  certain criteria was not obtained, such a blood pressure, CBG if applicable, and timed get up and go. Any medications not marked as taking were not mentioned during the medication reconciliation part of the visit. Any vitals not documented were not able to be obtained due to this being a telehealth visit or patient was unable to self-report a recent blood pressure reading due to a lack of equipment at home via telehealth. Vitals that have been documented are verbally provided by the patient.   Subjective:   Brian Moody. is a 58 y.o. who presents for a Medicare Wellness preventive visit.  As a reminder, Annual Wellness Visits don't include a physical exam, and some assessments may be limited, especially if this visit is performed virtually. We may recommend an in-person follow-up visit with your provider if needed.  Visit Complete: Virtual I connected with  Brian Moody. on 02/17/24 by a audio enabled telemedicine application and verified that I am speaking with the correct person using two identifiers.  Patient Location: Home  Provider Location: Office/Clinic  I discussed the limitations of evaluation and management by telemedicine. The patient expressed understanding and agreed to proceed.  Vital Signs: Because this visit was a virtual/telehealth visit, some criteria may be missing or patient reported. Any vitals not documented were not able to be obtained and vitals that have been documented are patient reported.  VideoDeclined- This patient declined Librarian, academic. Therefore the visit was completed with audio only.  Persons Participating in Visit: Patient.  AWV Questionnaire: No: Patient Medicare AWV questionnaire was not completed prior to this visit.  Cardiac Risk Factors include: advanced age (>78men, >88 women);sedentary lifestyle;diabetes mellitus;dyslipidemia;hypertension;male  gender;obesity (BMI >30kg/m2);family history of premature cardiovascular disease     Objective:    Today's Vitals   02/17/24 1338  Weight: 240 lb (108.9 kg)  Height: 5' 10 (1.778 m)  PainSc: 0-No pain   Body mass index is 34.44 kg/m.     02/17/2024    1:40 PM 02/03/2024   11:27 PM 02/11/2023    1:51 PM 05/22/2022    4:24 PM 03/23/2022   11:53 PM 02/24/2021   10:08 AM 10/17/2016   11:45 AM  Advanced Directives  Does Patient Have a Medical Advance Directive? No No No No No No No   Would patient like information on creating a medical advance directive? No - Patient declined  Yes (MAU/Ambulatory/Procedural Areas - Information given)   Yes (MAU/Ambulatory/Procedural Areas - Information given)      Data saved with a previous flowsheet row definition    Current Medications (verified) Outpatient Encounter Medications as of 02/17/2024  Medication Sig   acetaminophen  (TYLENOL ) 500 MG tablet Take 2 tablets (1,000 mg total) by mouth every 6 (six) hours as needed for headache. (Patient not taking: Reported on 10/30/2023)   apixaban  (ELIQUIS ) 5 MG TABS tablet Take 1 tablet (5 mg total) by mouth 2 (two) times daily.   atorvastatin  (LIPITOR) 20 MG tablet TAKE 1 TABLET BY MOUTH EVERY DAY   bisoprolol  (ZEBETA ) 10 MG tablet Take 2 tablets (20 mg total) by mouth daily.   blood glucose meter kit and supplies KIT Dispense based on patient and insurance preference. Use up to four times daily as directed. (FOR ICD-9 250.00, 250.01). (Patient not taking: Reported on 05/12/2023)   Blood Pressure Monitoring (5 SERIES BP MONITOR) DEVI 1 Units by Does not apply route daily. Measure blood pressure daily (  Patient not taking: Reported on 05/12/2023)   clobetasol  cream (TEMOVATE ) 0.05 % APPLY TO AFFECTED AREA TWICE A DAY (Patient not taking: Reported on 10/30/2023)   cyclobenzaprine  (FLEXERIL ) 5 MG tablet TAKE 1 TABLET BY MOUTH 2 TIMES DAILY AS NEEDED FOR MUSCLE SPASMS.   diclofenac  Sodium (VOLTAREN ) 1 % GEL Apply 2 g  topically 4 (four) times daily. To right knee (Patient not taking: Reported on 10/30/2023)   polyvinyl alcohol  (LIQUIFILM TEARS) 1.4 % ophthalmic solution Place 1 drop into both eyes as needed for dry eyes. (Patient not taking: Reported on 10/30/2023)   tadalafil  (CIALIS ) 5 MG tablet Take 1 tablet (5 mg total) by mouth daily as needed for erectile dysfunction. (Patient not taking: Reported on 10/30/2023)   Vitamin D , Ergocalciferol , (DRISDOL ) 1.25 MG (50000 UNIT) CAPS capsule Take 1 capsule (50,000 Units total) by mouth every 7 (seven) days.   [DISCONTINUED] carvedilol  (COREG ) 12.5 MG tablet Take 1 tablet (12.5 mg total) by mouth 2 (two) times daily with a meal.   No facility-administered encounter medications on file as of 02/17/2024.    Allergies (verified) Patient has no known allergies.   History: Past Medical History:  Diagnosis Date   Avulsion fracture of distal fibula 06/02/2013   Right distal fibula occurred 04/30/2013    Dysuria 08/14/2020   Hordeolum externum left upper eyelid 06/29/2019   Hypertension    Persistent atrial fibrillation (HCC)    a. newly diagnosed on 05/2015 admission. started on coumadin .    Pre-diabetes    a. HgA1c 6.3 05/2015   Tobacco abuse    Past Surgical History:  Procedure Laterality Date   IR CT HEAD LTD  03/24/2022   IR PERCUTANEOUS ART THROMBECTOMY/INFUSION INTRACRANIAL INC DIAG ANGIO  03/24/2022   IR US  GUIDE VASC ACCESS RIGHT  03/24/2022   RADIOLOGY WITH ANESTHESIA N/A 03/24/2022   Procedure: IR WITH ANESTHESIA;  Surgeon: Radiologist, Medication, MD;  Location: [facility] OR;  Service: Radiology;  Laterality: N/A;   Family History  Problem Relation Age of Onset   Hypertension Father    Diabetes Maternal Grandmother    Hypertension Maternal Grandmother    Diabetes Maternal Grandfather    Hypertension Maternal Grandfather    Social History   Socioeconomic History   Marital status: Married    Spouse name: Not on file   Number of children: Not on file    Years of education: Not on file   Highest education level: Not on file  Occupational History   Not on file  Tobacco Use   Smoking status: Former    Current packs/day: 0.10    Types: Cigarettes    Passive exposure: Never   Smokeless tobacco: Never  Vaping Use   Vaping status: Never Used  Substance and Sexual Activity   Alcohol  use: No   Drug use: No   Sexual activity: Not Currently  Other Topics Concern   Not on file  Social History Narrative   Not on file   Social Drivers of Health   Financial Resource Strain: Low Risk  (02/17/2024)   Overall Financial Resource Strain (CARDIA)    Difficulty of Paying Living Expenses: Not hard at all  Food Insecurity: No Food Insecurity (02/17/2024)   Hunger Vital Sign    Worried About Running Out of Food in the Last Year: Never true    Ran Out of Food in the Last Year: Never true  Transportation Needs: No Transportation Needs (02/17/2024)   PRAPARE - Administrator, Civil Service (Medical):  No    Lack of Transportation (Non-Medical): No  Physical Activity: Inactive (02/17/2024)   Exercise Vital Sign    Days of Exercise per Week: 0 days    Minutes of Exercise per Session: 0 min  Stress: No Stress Concern Present (02/17/2024)   [employer] of Occupational Health - Occupational Stress Questionnaire    Feeling of Stress: Not at all  Social Connections: Moderately Integrated (02/17/2024)   Social Connection and Isolation Panel    Frequency of Communication with Friends and Family: More than three times a week    Frequency of Social Gatherings with Friends and Family: Three times a week    Attends Religious Services: 1 to 4 times per year    Active Member of Clubs or Organizations: No    Attends Banker Meetings: Never    Marital Status: Married    Tobacco Counseling Counseling given: Not Answered    Clinical Intake:  Pre-visit preparation completed: Yes  Pain : No/denies pain Pain Score: 0-No pain      BMI - recorded: 34.44 Nutritional Status: BMI > 30  Obese Nutritional Risks: None Diabetes: Yes CBG done?: No Did pt. bring in CBG monitor from home?: No  Lab Results  Component Value Date   HGBA1C 5.5 10/30/2023   HGBA1C 6.0 (H) 05/12/2023   HGBA1C 5.7 (A) 01/31/2023     How often do you need to have someone help you when you read instructions, pamphlets, or other written materials from your doctor or pharmacy?: 1 - Never  Interpreter Needed?: No  Information entered by :: Pinchas Reither N. Cassandr Cederberg, LPN.   Activities of Daily Living     02/17/2024    1:47 PM  In your present state of health, do you have any difficulty performing the following activities:  Hearing? 0  Vision? 0  Difficulty concentrating or making decisions? 0  Walking or climbing stairs? 0  Dressing or bathing? 0  Doing errands, shopping? 0  Preparing Food and eating ? N  Using the Toilet? N  In the past six months, have you accidently leaked urine? N  Do you have problems with loss of bowel control? N  Managing your Medications? N  Managing your Finances? N  Housekeeping or managing your Housekeeping? N    Patient Care Team: Delbert Clam, MD as PCP - General (Family Medicine)  I have updated your Care Teams any recent Medical Services you may have received from other providers in the past year.     Assessment:   This is a routine wellness examination for [place].  Hearing/Vision screen Hearing Screening - Comments:: Adequate hearing. Vision Screening - Comments:: Adequate vision, uses eyeglasses.  Patient overdue for eye exam.    Goals Addressed             This Visit's Progress    02/17/2024: Continue to maintain my health and stay independent.         Depression Screen     02/17/2024    1:45 PM 10/30/2023   10:25 AM 05/12/2023    3:08 PM 02/11/2023    1:50 PM 01/31/2023    3:42 PM 08/01/2022   10:27 AM 05/30/2022   10:44 AM  PHQ 2/9 Scores  PHQ - 2 Score 0 0 0 0 0 0 0  PHQ- 9  Score 0  0 0       Fall Risk     02/17/2024    1:41 PM 10/30/2023   10:25 AM 05/12/2023  3:08 PM 02/11/2023    1:51 PM 01/31/2023    3:42 PM  Fall Risk   Falls in the past year? 1 0 0 0 0  Comment FELL IN TUB      Number falls in past yr: 0 0 0 0 0  Injury with Fall? 0 0 0 0 0  Risk for fall due to : No Fall Risks No Fall Risks No Fall Risks No Fall Risks No Fall Risks  Follow up Falls evaluation completed;Education provided Falls evaluation completed Falls evaluation completed Falls prevention discussed;Education provided;Falls evaluation completed Falls evaluation completed    MEDICARE RISK AT HOME:  Medicare Risk at Home Any stairs in or around the home?: No If so, are there any without handrails?: No Home free of loose throw rugs in walkways, pet beds, electrical cords, etc?: Yes Adequate lighting in your home to reduce risk of falls?: Yes Life alert?: No Use of a cane, walker or w/c?: No Grab bars in the bathroom?: No (USES SLIP RESISTENT MAT) Shower chair or bench in shower?: No Elevated toilet seat or a handicapped toilet?: No  TIMED UP AND GO:  Was the test performed?  No  Cognitive Function: Declined/Normal: No cognitive concerns noted by patient or family. Patient alert, oriented, able to answer questions appropriately and recall recent events. No signs of memory loss or confusion.    02/17/2024    1:44 PM  MMSE - Mini Mental State Exam  Not completed: Unable to complete        02/17/2024    1:45 PM 02/11/2023    1:52 PM 05/22/2022    4:25 PM 02/24/2021   10:05 AM  6CIT Screen  What Year? 0 points 0 points 0 points 0 points  What month? 0 points 0 points 0 points 0 points  What time? 0 points 0 points 3 points 0 points  Count back from 20 0 points 0 points 0 points 0 points  Months in reverse 0 points 2 points 4 points 0 points  Repeat phrase 0 points 2 points 4 points   Total Score 0 points 4 points 11 points     Immunizations Immunization History   Administered Date(s) Administered   Moderna Covid-19 Vaccine  Bivalent Booster 68yrs & up 01/14/2022    Screening Tests Health Maintenance  Topic Date Due   OPHTHALMOLOGY EXAM  Never done   Pneumococcal Vaccine: 50+ Years (1 of 2 - PCV) Never done   Hepatitis B Vaccines 19-59 Average Risk (1 of 3 - 19+ 3-dose series) Never done   COLON CANCER SCREENING ANNUAL FOBT  Never done   FOOT EXAM  05/31/2023   Diabetic kidney evaluation - Urine ACR  01/31/2024   INFLUENZA VACCINE  01/23/2024   Zoster Vaccines- Shingrix (1 of 2) 03/16/2024 (Originally 12/24/2014)   DTaP/Tdap/Td (1 - Tdap) 05/11/2024 (Originally 12/24/1983)   HEMOGLOBIN A1C  05/01/2024   Diabetic kidney evaluation - eGFR measurement  02/03/2025   Medicare Annual Wellness (AWV)  02/16/2025   HIV Screening  Completed   HPV VACCINES  Aged Out   Meningococcal B Vaccine  Aged Out   Colonoscopy  Discontinued   COVID-19 Vaccine  Discontinued   Hepatitis C Screening  Discontinued    Health Maintenance  Health Maintenance Due  Topic Date Due   OPHTHALMOLOGY EXAM  Never done   Pneumococcal Vaccine: 50+ Years (1 of 2 - PCV) Never done   Hepatitis B Vaccines 19-59 Average Risk (1 of 3 - 19+ 3-dose series)  Never done   COLON CANCER SCREENING ANNUAL FOBT  Never done   FOOT EXAM  05/31/2023   Diabetic kidney evaluation - Urine ACR  01/31/2024   INFLUENZA VACCINE  01/23/2024   Health Maintenance Items Addressed: Yes Patient aware of current care gaps.   Additional Screening:  Vision Screening: Recommended annual ophthalmology exams for early detection of glaucoma and other disorders of the eye. Would you like a referral to an eye doctor? No    Dental Screening: Recommended annual dental exams for proper oral hygiene  Community Resource Referral / Chronic Care Management: CRR required this visit?  No   CCM required this visit?  No   Plan:    I have personally reviewed and noted the following in the patient's chart:    Medical and social history Use of alcohol , tobacco or illicit drugs  Current medications and supplements including opioid prescriptions. Patient is not currently taking opioid prescriptions. Functional ability and status Nutritional status Physical activity Advanced directives List of other physicians Hospitalizations, surgeries, and ER visits in previous 12 months Vitals Screenings to include cognitive, depression, and falls Referrals and appointments  In addition, I have reviewed and discussed with patient certain preventive protocols, quality metrics, and best practice recommendations. A written personalized care plan for preventive services as well as general preventive health recommendations were provided to patient.   Roz LOISE Fuller, LPN   [id number]   After Visit Summary: (Declined) Due to this being a telephonic visit, with patients personalized plan was offered to patient but patient Declined AVS at this time   Notes: Patient aware of current care gaps. Patient is due for the following care gaps: FOBT, Urine ACR, Diabetic Foot Exam, Diabetic Eye Exam, Hep B Series, Flu Vaccine and Pneumonia vaccines.

## 2024-02-17 NOTE — Patient Instructions (Addendum)
 Brian Moody , Thank you for taking time out of your busy schedule to complete your Annual Wellness Visit with me. I enjoyed our conversation and look forward to speaking with you again next year. I, as well as your care team,  appreciate your ongoing commitment to your health goals. Please review the following plan we discussed and let me know if I can assist you in the future. Your Game plan/ To Do List    Referrals: If you haven't heard from the office you've been referred to, please reach out to them at the phone provided.   Follow up Visits: We will see or speak with you next year for your Next Medicare AWV with our clinical staff Have you seen your provider in the last 6 months (3 months if uncontrolled diabetes)? Yes  Clinician Recommendations:  Aim for 30 minutes of exercise or brisk walking, 6-8 glasses of water, and 5 servings of fruits and vegetables each day.       This is a list of the screenings recommended for you:  Health Maintenance  Topic Date Due   Eye exam for diabetics  Never done   Pneumococcal Vaccine for age over 65 (1 of 2 - PCV) Never done   Hepatitis B Vaccine (1 of 3 - 19+ 3-dose series) Never done   Stool Blood Test  Never done   Complete foot exam   05/31/2023   Yearly kidney health urinalysis for diabetes  01/31/2024   Flu Shot  01/23/2024   Zoster (Shingles) Vaccine (1 of 2) 03/16/2024*   DTaP/Tdap/Td vaccine (1 - Tdap) 05/11/2024*   Hemoglobin A1C  05/01/2024   Yearly kidney function blood test for diabetes  02/03/2025   Medicare Annual Wellness Visit  02/16/2025   HIV Screening  Completed   HPV Vaccine  Aged Out   Meningitis B Vaccine  Aged Out   Colon Cancer Screening  Discontinued   COVID-19 Vaccine  Discontinued   Hepatitis C Screening  Discontinued  *Topic was postponed. The date shown is not the original due date.    Advanced directives: (Declined) Advance directive discussed with you today. Even though you declined this today, please call our  office should you change your mind, and we can give you the proper paperwork for you to fill out. Advance Care Planning is important because it:  [x]  Makes sure you receive the medical care that is consistent with your values, goals, and preferences  [x]  It provides guidance to your family and loved ones and reduces their decisional burden about whether or not they are making the right decisions based on your wishes.  Follow the link provided in your after visit summary or read over the paperwork we have mailed to you to help you started getting your Advance Directives in place. If you need assistance in completing these, please reach out to us  so that we can help you!  See attachments for Preventive Care and Fall Prevention Tips.

## 2024-02-18 ENCOUNTER — Telehealth: Payer: Self-pay | Admitting: Family Medicine

## 2024-02-18 ENCOUNTER — Other Ambulatory Visit: Payer: Self-pay | Admitting: Critical Care Medicine

## 2024-02-18 NOTE — Telephone Encounter (Unsigned)
 Copied from CRM #[id number]. Topic: Clinical - Medication Refill >> Feb 18, 2024  4:32 PM [name] wrote: Medication: amlodipine  10mg  tablet Brian Moody with [facility] calling to request refill to pharmacy below.  Has the patient contacted their pharmacy? Yes   This is the patient's preferred pharmacy:  [id number] - [place], [place] - [address] AT [facility] [address] [place] [address] Phone: [phone] Fax: [phone]  Is this the correct pharmacy for this prescription? Yes  Has the prescription been filled recently? No  Is the patient out of the medication? Yes  Has the patient been seen for an appointment in the last year OR does the patient have an upcoming appointment? Yes  Can we respond through MyChart? No  Agent: Please be advised that Rx refills may take up to 3 business days. We ask that you follow-up with your pharmacy.

## 2024-02-18 NOTE — Telephone Encounter (Signed)
 Person calling in on behalf of pt to request medication refill for medication not on med list    Copied from CRM [id number]. Topic: Clinical - Medication Refill >> Feb 18, 2024  4:32 PM [name] wrote: Medication: amlodipine  10mg  tablet Erminio with [facility] calling to request refill to pharmacy below.   Has the patient contacted their pharmacy? Yes     This is the patient's preferred pharmacy:  [id number] - [place], [place] - [address] AT [facility] [address] [place] [address] Phone: [phone] Fax: [phone]   Is this the correct pharmacy for this prescription? Yes   Has the prescription been filled recently? No   Is the patient out of the medication? Yes   Has the patient been seen for an appointment in the last year OR does the patient have an upcoming appointment? Yes   Can we respond through MyChart? No   Agent: Please be advised that Rx refills may take up to 3 business days. We ask that you follow-up with your pharmacy.

## 2024-05-04 ENCOUNTER — Other Ambulatory Visit: Payer: Self-pay | Admitting: Pharmacist

## 2024-05-04 NOTE — Progress Notes (Signed)
 Pharmacy Quality Measure Review  This patient is appearing on a report for being at risk of failing the adherence measure for cholesterol (statin) medications this calendar year.   Medication: atorvastatin  Last fill date: 02/16/24 for 90 day supply  Insurance report was not up to date. No action needed at this time.  Reminder set for refill at the end of this month.

## 2024-05-17 ENCOUNTER — Other Ambulatory Visit: Payer: Self-pay | Admitting: Pharmacist

## 2024-05-17 NOTE — Progress Notes (Signed)
 Pharmacy Quality Measure Review  This patient is appearing on a report for being at risk of failing the adherence measure for cholesterol (statin) medications this calendar year.   Medication: atorvastatin  Last fill date: 05/14/2024 for 90 day supply  Insurance report was not up to date. No action needed at this time.    Herlene Fleeta Morris, PharmD, JAQUELINE, CPP Clinical Pharmacist [facility] [phone]

## 2025-02-22 ENCOUNTER — Ambulatory Visit
